# Patient Record
Sex: Male | Born: 1950
Health system: Southern US, Community
[De-identification: ages and names within clinical notes are randomized; demographics above are authoritative.]

## PROBLEM LIST (undated history)

## (undated) ENCOUNTER — Emergency Department (HOSPITAL_COMMUNITY): Admission: EM | Payer: Self-pay | Source: Home / Self Care

## (undated) DIAGNOSIS — Z7901 Long term (current) use of anticoagulants: Secondary | ICD-10-CM

## (undated) DIAGNOSIS — Z87898 Personal history of other specified conditions: Secondary | ICD-10-CM

## (undated) DIAGNOSIS — K4090 Unilateral inguinal hernia, without obstruction or gangrene, not specified as recurrent: Secondary | ICD-10-CM

## (undated) DIAGNOSIS — I8390 Asymptomatic varicose veins of unspecified lower extremity: Secondary | ICD-10-CM

## (undated) DIAGNOSIS — I700 Atherosclerosis of aorta: Secondary | ICD-10-CM

## (undated) DIAGNOSIS — Z87442 Personal history of urinary calculi: Secondary | ICD-10-CM

## (undated) DIAGNOSIS — Z720 Tobacco use: Secondary | ICD-10-CM

## (undated) DIAGNOSIS — I255 Ischemic cardiomyopathy: Secondary | ICD-10-CM

## (undated) DIAGNOSIS — G959 Disease of spinal cord, unspecified: Secondary | ICD-10-CM

## (undated) DIAGNOSIS — F329 Major depressive disorder, single episode, unspecified: Secondary | ICD-10-CM

## (undated) DIAGNOSIS — K2970 Gastritis, unspecified, without bleeding: Secondary | ICD-10-CM

## (undated) DIAGNOSIS — I509 Heart failure, unspecified: Secondary | ICD-10-CM

## (undated) DIAGNOSIS — J449 Chronic obstructive pulmonary disease, unspecified: Secondary | ICD-10-CM

## (undated) DIAGNOSIS — G4700 Insomnia, unspecified: Secondary | ICD-10-CM

## (undated) DIAGNOSIS — I672 Cerebral atherosclerosis: Secondary | ICD-10-CM

## (undated) DIAGNOSIS — K219 Gastro-esophageal reflux disease without esophagitis: Secondary | ICD-10-CM

## (undated) DIAGNOSIS — D649 Anemia, unspecified: Secondary | ICD-10-CM

## (undated) DIAGNOSIS — F419 Anxiety disorder, unspecified: Secondary | ICD-10-CM

## (undated) DIAGNOSIS — Z950 Presence of cardiac pacemaker: Secondary | ICD-10-CM

## (undated) DIAGNOSIS — D3502 Benign neoplasm of left adrenal gland: Secondary | ICD-10-CM

## (undated) DIAGNOSIS — G4733 Obstructive sleep apnea (adult) (pediatric): Secondary | ICD-10-CM

## (undated) DIAGNOSIS — D509 Iron deficiency anemia, unspecified: Secondary | ICD-10-CM

## (undated) DIAGNOSIS — I2510 Atherosclerotic heart disease of native coronary artery without angina pectoris: Secondary | ICD-10-CM

## (undated) DIAGNOSIS — E669 Obesity, unspecified: Secondary | ICD-10-CM

## (undated) DIAGNOSIS — E782 Mixed hyperlipidemia: Secondary | ICD-10-CM

## (undated) DIAGNOSIS — M5090 Cervical disc disorder, unspecified, unspecified cervical region: Secondary | ICD-10-CM

## (undated) DIAGNOSIS — M1990 Unspecified osteoarthritis, unspecified site: Secondary | ICD-10-CM

## (undated) DIAGNOSIS — N200 Calculus of kidney: Secondary | ICD-10-CM

## (undated) DIAGNOSIS — F32A Depression, unspecified: Secondary | ICD-10-CM

## (undated) DIAGNOSIS — E119 Type 2 diabetes mellitus without complications: Secondary | ICD-10-CM

## (undated) DIAGNOSIS — R42 Dizziness and giddiness: Secondary | ICD-10-CM

## (undated) DIAGNOSIS — J45909 Unspecified asthma, uncomplicated: Secondary | ICD-10-CM

## (undated) DIAGNOSIS — I639 Cerebral infarction, unspecified: Secondary | ICD-10-CM

## (undated) DIAGNOSIS — R32 Unspecified urinary incontinence: Secondary | ICD-10-CM

## (undated) DIAGNOSIS — I10 Essential (primary) hypertension: Secondary | ICD-10-CM

## (undated) DIAGNOSIS — I213 ST elevation (STEMI) myocardial infarction of unspecified site: Secondary | ICD-10-CM

## (undated) HISTORY — DX: Cerebral atherosclerosis: I67.2

## (undated) HISTORY — DX: Atherosclerotic heart disease of native coronary artery without angina pectoris: I25.10

## (undated) HISTORY — DX: Obesity, unspecified: E66.9

## (undated) HISTORY — PX: BACK SURGERY: SHX140

## (undated) HISTORY — DX: Mixed hyperlipidemia: E78.2

## (undated) HISTORY — DX: Essential (primary) hypertension: I10

## (undated) HISTORY — PX: CORONARY ANGIOPLASTY: SHX604

## (undated) HISTORY — DX: Tobacco use: Z72.0

## (undated) HISTORY — DX: Obstructive sleep apnea (adult) (pediatric): G47.33

## (undated) HISTORY — PX: HERNIA REPAIR: SHX51

## (undated) HISTORY — DX: Chronic obstructive pulmonary disease, unspecified: J44.9

## (undated) HISTORY — DX: ST elevation (STEMI) myocardial infarction of unspecified site: I21.3

## (undated) HISTORY — PX: EYE SURGERY: SHX253

## (undated) HISTORY — PX: CERVICAL FUSION: SHX112

---

## 2001-01-20 HISTORY — PX: BACK SURGERY: SHX140

## 2003-08-13 ENCOUNTER — Other Ambulatory Visit: Payer: Self-pay

## 2004-03-06 ENCOUNTER — Emergency Department: Payer: Self-pay | Admitting: Internal Medicine

## 2004-07-02 ENCOUNTER — Emergency Department: Payer: Self-pay | Admitting: Unknown Physician Specialty

## 2005-05-16 ENCOUNTER — Emergency Department: Payer: Self-pay | Admitting: Emergency Medicine

## 2005-05-16 ENCOUNTER — Other Ambulatory Visit: Payer: Self-pay

## 2006-03-15 ENCOUNTER — Emergency Department: Payer: Self-pay | Admitting: Emergency Medicine

## 2008-10-29 ENCOUNTER — Inpatient Hospital Stay: Payer: Self-pay | Admitting: Internal Medicine

## 2010-06-07 ENCOUNTER — Inpatient Hospital Stay (HOSPITAL_COMMUNITY)
Admission: EM | Admit: 2010-06-07 | Discharge: 2010-06-11 | DRG: 246 | Disposition: A | Discharge status: Home Health | Payer: Self-pay | Source: Ambulatory Visit | Attending: Cardiovascular Disease | Admitting: Cardiovascular Disease

## 2010-06-07 DIAGNOSIS — F3289 Other specified depressive episodes: Secondary | ICD-10-CM | POA: Diagnosis present

## 2010-06-07 DIAGNOSIS — F17200 Nicotine dependence, unspecified, uncomplicated: Secondary | ICD-10-CM | POA: Diagnosis present

## 2010-06-07 DIAGNOSIS — I10 Essential (primary) hypertension: Secondary | ICD-10-CM | POA: Diagnosis present

## 2010-06-07 DIAGNOSIS — I213 ST elevation (STEMI) myocardial infarction of unspecified site: Secondary | ICD-10-CM

## 2010-06-07 DIAGNOSIS — I498 Other specified cardiac arrhythmias: Secondary | ICD-10-CM | POA: Diagnosis not present

## 2010-06-07 DIAGNOSIS — E785 Hyperlipidemia, unspecified: Secondary | ICD-10-CM | POA: Diagnosis present

## 2010-06-07 DIAGNOSIS — I509 Heart failure, unspecified: Secondary | ICD-10-CM | POA: Diagnosis not present

## 2010-06-07 DIAGNOSIS — Z79899 Other long term (current) drug therapy: Secondary | ICD-10-CM

## 2010-06-07 DIAGNOSIS — F329 Major depressive disorder, single episode, unspecified: Secondary | ICD-10-CM | POA: Diagnosis present

## 2010-06-07 DIAGNOSIS — J4489 Other specified chronic obstructive pulmonary disease: Secondary | ICD-10-CM | POA: Diagnosis present

## 2010-06-07 DIAGNOSIS — I2119 ST elevation (STEMI) myocardial infarction involving other coronary artery of inferior wall: Principal | ICD-10-CM | POA: Diagnosis present

## 2010-06-07 DIAGNOSIS — I2510 Atherosclerotic heart disease of native coronary artery without angina pectoris: Secondary | ICD-10-CM | POA: Diagnosis present

## 2010-06-07 DIAGNOSIS — I5021 Acute systolic (congestive) heart failure: Secondary | ICD-10-CM | POA: Diagnosis not present

## 2010-06-07 DIAGNOSIS — R7989 Other specified abnormal findings of blood chemistry: Secondary | ICD-10-CM | POA: Diagnosis present

## 2010-06-07 DIAGNOSIS — J449 Chronic obstructive pulmonary disease, unspecified: Secondary | ICD-10-CM | POA: Diagnosis present

## 2010-06-07 DIAGNOSIS — D649 Anemia, unspecified: Secondary | ICD-10-CM | POA: Diagnosis present

## 2010-06-07 HISTORY — DX: ST elevation (STEMI) myocardial infarction of unspecified site: I21.3

## 2010-06-07 HISTORY — PX: CARDIAC CATHETERIZATION: SHX172

## 2010-06-07 LAB — DIFFERENTIAL
Basophils Absolute: 0 10*3/uL (ref 0.0–0.1)
Eosinophils Absolute: 0 10*3/uL (ref 0.0–0.7)
Eosinophils Relative: 0 % (ref 0–5)
Lymphs Abs: 0.8 10*3/uL (ref 0.7–4.0)

## 2010-06-07 LAB — PRO B NATRIURETIC PEPTIDE: Pro B Natriuretic peptide (BNP): 718.1 pg/mL — ABNORMAL HIGH (ref 0–125)

## 2010-06-07 LAB — APTT: aPTT: 120 seconds — ABNORMAL HIGH (ref 24–37)

## 2010-06-08 ENCOUNTER — Ambulatory Visit (HOSPITAL_COMMUNITY): Payer: Self-pay

## 2010-06-08 LAB — DIFFERENTIAL
Eosinophils Absolute: 0 10*3/uL (ref 0.0–0.7)
Eosinophils Relative: 0 % (ref 0–5)
Lymphs Abs: 1 10*3/uL (ref 0.7–4.0)
Monocytes Absolute: 0.7 10*3/uL (ref 0.1–1.0)
Monocytes Relative: 8 % (ref 3–12)

## 2010-06-08 LAB — POCT ACTIVATED CLOTTING TIME: Activated Clotting Time: 111 seconds

## 2010-06-08 LAB — APTT: aPTT: 61 seconds — ABNORMAL HIGH (ref 24–37)

## 2010-06-08 LAB — MRSA PCR SCREENING: MRSA by PCR: NEGATIVE

## 2010-06-08 LAB — POTASSIUM: Potassium: 3.8 mEq/L (ref 3.5–5.1)

## 2010-06-08 LAB — PROTIME-INR: INR: 1.19 (ref 0.00–1.49)

## 2010-06-09 ENCOUNTER — Ambulatory Visit (HOSPITAL_COMMUNITY): Payer: Self-pay

## 2010-06-09 LAB — CBC
HCT: 34.2 % — ABNORMAL LOW (ref 39.0–52.0)
Hemoglobin: 11.1 g/dL — ABNORMAL LOW (ref 13.0–17.0)
MCH: 30.2 pg (ref 26.0–34.0)
MCH: 31.3 pg (ref 26.0–34.0)
MCHC: 33.9 g/dL (ref 30.0–36.0)
MCHC: 33.7 g/dL (ref 30.0–36.0)
MCV: 89.4 fL (ref 78.0–100.0)
MCV: 89.1 fL (ref 78.0–100.0)
MCV: 92.7 fL (ref 78.0–100.0)
Platelets: 202 10*3/uL (ref 150–400)
Platelets: 184 10*3/uL (ref 150–400)
RBC: 3.55 MIL/uL — ABNORMAL LOW (ref 4.22–5.81)
RDW: 13.3 % (ref 11.5–15.5)
RDW: 13.5 % (ref 11.5–15.5)
WBC: 13.2 10*3/uL — ABNORMAL HIGH (ref 4.0–10.5)

## 2010-06-09 LAB — CARDIAC PANEL(CRET KIN+CKTOT+MB+TROPI)
CK, MB: 326.8 ng/mL (ref 0.3–4.0)
CK, MB: 224.5 ng/mL (ref 0.3–4.0)
Relative Index: 8.9 — ABNORMAL HIGH (ref 0.0–2.5)
Relative Index: 7.5 — ABNORMAL HIGH (ref 0.0–2.5)
Total CK: 2558 U/L — ABNORMAL HIGH (ref 7–232)
Troponin I: 25 ng/mL (ref <0.30)

## 2010-06-09 LAB — COMPREHENSIVE METABOLIC PANEL
ALT: 65 U/L — ABNORMAL HIGH (ref 0–53)
ALT: 61 U/L — ABNORMAL HIGH (ref 0–53)
AST: 210 U/L — ABNORMAL HIGH (ref 0–37)
AST: 231 U/L — ABNORMAL HIGH (ref 0–37)
AST: 125 U/L — ABNORMAL HIGH (ref 0–37)
Albumin: 3.1 g/dL — ABNORMAL LOW (ref 3.5–5.2)
Albumin: 3 g/dL — ABNORMAL LOW (ref 3.5–5.2)
Alkaline Phosphatase: 101 U/L (ref 39–117)
Alkaline Phosphatase: 103 U/L (ref 39–117)
Alkaline Phosphatase: 109 U/L (ref 39–117)
BUN: 17 mg/dL (ref 6–23)
CO2: 26 mEq/L (ref 19–32)
Calcium: 8.5 mg/dL (ref 8.4–10.5)
Chloride: 99 mEq/L (ref 96–112)
Chloride: 102 mEq/L (ref 96–112)
Chloride: 106 mEq/L (ref 96–112)
Creatinine, Ser: 1.08 mg/dL (ref 0.4–1.5)
GFR calc Af Amer: >60 mL/min (ref >60)
GFR calc Af Amer: >60 mL/min (ref >60)
GFR calc Af Amer: >60 mL/min (ref >60)
GFR calc non Af Amer: 52 mL/min — ABNORMAL LOW (ref >60)
GFR calc non Af Amer: >60 mL/min (ref >60)
Glucose, Bld: 163 mg/dL — ABNORMAL HIGH (ref 70–99)
Glucose, Bld: 127 mg/dL — ABNORMAL HIGH (ref 70–99)
Potassium: 3.2 mEq/L — ABNORMAL LOW (ref 3.5–5.1)
Potassium: 3.3 mEq/L — ABNORMAL LOW (ref 3.5–5.1)
Sodium: 138 mEq/L (ref 135–145)
Sodium: 139 mEq/L (ref 135–145)
Total Bilirubin: 0.5 mg/dL (ref 0.3–1.2)
Total Bilirubin: 0.8 mg/dL (ref 0.3–1.2)
Total Bilirubin: 0.9 mg/dL (ref 0.3–1.2)
Total Protein: 5.6 g/dL — ABNORMAL LOW (ref 6.0–8.3)
Total Protein: 5.5 g/dL — ABNORMAL LOW (ref 6.0–8.3)

## 2010-06-09 LAB — CK TOTAL AND CKMB (NOT AT ARMC)
CK, MB: 145.2 ng/mL (ref 0.3–4.0)
Relative Index: 9.1 — ABNORMAL HIGH (ref 0.0–2.5)
Total CK: 1595 U/L — ABNORMAL HIGH (ref 7–232)

## 2010-06-09 LAB — TSH: TSH: 1.442 u[IU]/mL (ref 0.350–4.500)

## 2010-06-09 LAB — HEMOGLOBIN A1C: Hgb A1c MFr Bld: 6 % (ref <5.7)

## 2010-06-10 ENCOUNTER — Ambulatory Visit (HOSPITAL_COMMUNITY): Payer: Self-pay

## 2010-06-10 DIAGNOSIS — I10 Essential (primary) hypertension: Secondary | ICD-10-CM | POA: Insufficient documentation

## 2010-06-10 LAB — POCT I-STAT, CHEM 8
Creatinine, Ser: 0.8 mg/dL (ref 0.4–1.5)
Glucose, Bld: 134 mg/dL — ABNORMAL HIGH (ref 70–99)
HCT: 37 % — ABNORMAL LOW (ref 39.0–52.0)
Hemoglobin: 12.6 g/dL — ABNORMAL LOW (ref 13.0–17.0)
Potassium: 2.5 mEq/L — CL (ref 3.5–5.1)
TCO2: 18 mmol/L (ref 0–100)

## 2010-06-10 LAB — COMPREHENSIVE METABOLIC PANEL
AST: 58 U/L — ABNORMAL HIGH (ref 0–37)
Albumin: 2.7 g/dL — ABNORMAL LOW (ref 3.5–5.2)
Alkaline Phosphatase: 122 U/L — ABNORMAL HIGH (ref 39–117)
BUN: 18 mg/dL (ref 6–23)
CO2: 24 mEq/L (ref 19–32)
Chloride: 107 mEq/L (ref 96–112)
Creatinine, Ser: 0.83 mg/dL (ref 0.4–1.5)
GFR calc Af Amer: >60 mL/min (ref >60)
GFR calc non Af Amer: >60 mL/min (ref >60)
Potassium: 3.7 mEq/L (ref 3.5–5.1)
Total Bilirubin: 1 mg/dL (ref 0.3–1.2)

## 2010-06-10 LAB — CBC
HCT: 33.3 % — ABNORMAL LOW (ref 39.0–52.0)
Hemoglobin: 11.4 g/dL — ABNORMAL LOW (ref 13.0–17.0)
MCH: 31.3 pg (ref 26.0–34.0)
MCHC: 34.2 g/dL (ref 30.0–36.0)
MCV: 91.5 fL (ref 78.0–100.0)
Platelets: 165 10*3/uL (ref 150–400)
RBC: 3.64 MIL/uL — ABNORMAL LOW (ref 4.22–5.81)
WBC: 9.3 10*3/uL (ref 4.0–10.5)

## 2010-06-10 LAB — CARDIAC PANEL(CRET KIN+CKTOT+MB+TROPI)
Relative Index: 1.3 (ref 0.0–2.5)
Troponin I: 12.33 ng/mL (ref <0.30)

## 2010-06-11 LAB — BASIC METABOLIC PANEL
Calcium: 8.9 mg/dL (ref 8.4–10.5)
Creatinine, Ser: 0.99 mg/dL (ref 0.4–1.5)
GFR calc Af Amer: >60 mL/min (ref >60)
GFR calc non Af Amer: >60 mL/min (ref >60)
Sodium: 141 mEq/L (ref 135–145)

## 2010-06-11 LAB — CBC
MCH: 30.5 pg (ref 26.0–34.0)
MCHC: 33.1 g/dL (ref 30.0–36.0)
Platelets: 187 10*3/uL (ref 150–400)
RDW: 13.7 % (ref 11.5–15.5)

## 2010-07-04 NOTE — Discharge Summary (Signed)
Louis Gutierrez, Louis Gutierrez                  ACCOUNT NO.:  [id number]  MEDICAL RECORD NO.:  [id number]           PATIENT TYPE:  I  LOCATION:  2924                         FACILITY:  [facility]  PHYSICIAN:  Nicki Guadalajara, M.D.     DATE OF BIRTH:  [birth year]  DATE OF ADMISSION:  06/07/2010 DATE OF DISCHARGE:  06/11/2010                              DISCHARGE SUMMARY   DISCHARGE DIAGNOSES: 1. ST-elevation myocardial infarction of the inferior wall affecting     the right coronary artery.     a.     Status post emergent catheterization with rescue      percutaneous transluminal coronary angioplasty and stent      deployment to the right coronary artery with a drug-eluting PROMUS      stent.     b.     Residual nonobstructive disease in the diagonal of the left      anterior descending coronary artery of 40 and 50%. 2. Bradycardia during procedure. 3. Hypertension. 4. Chronic obstructive pulmonary disease. 5. Tobacco use. 6. Hypokalemia resolved. 7. Mild systolic heart failure secondary to thin myocardium resolved. 8. Abnormal LFTs improving secondary to myocardial infarction. 9. Dyslipidemia. 10.Mild anemia stable. 11.Mild left ventricular dysfunction with ejection fraction 45-50%.  DISCHARGE CONDITION:  Improved.  PROCEDURES:  Emergent cardiac catheterization Jun 07, 2010.  Jun 07, 2010, rescue PTCA and stent deployment to the right coronary artery by Dr. Tresa Endo now with a PROMUS drug-eluting stent.  DISCHARGE MEDICATIONS: 1. Tylenol 325 mg every 4 hours as needed. 2. Colace 100 mg twice a day as needed for constipation. 3. Imdur 30 mg one daily. 4. Metoprolol tartrate 25 mg every 12 hours lower dose due to heart     rate. 5. Niacin 500 mg, this is Niaspan one at bedtime with a low-fat snack. 6. Nicotine patch 21 mg 24 hours transdermally daily. 7. Nitroglycerin sublingual p.r.n. chest pain. 8. Protonix 40 mg daily to prevent stomach irritation. 9. Ramipril 5 mg daily. 10.Crestor 20 mg  daily. 11.Brilinta twice a day as ordered, do not stop, stopping could cause     a heart attack. 12.Ambien 5 mg daily at bedtime as needed as before. 13.Aspirin 81 mg daily. 14.Paroxetine 20 mg daily twice a day as before. 15.QVAR inhaler twice daily. 16.Ventolin albuterol four times a day as needed. 17.Stop hydrochlorothiazide. 18.Stop Advil.  DISCHARGE INSTRUCTIONS: 1. No work until cleared by Dr. Tresa Endo. 2. Increase activity slowly.  May shower.  No lifting for 2 weeks.  No     driving for 2 weeks.  No sexual activity for 2 weeks. 3. Low-sodium heart-healthy diet. 4. Wash cath site with soap and water.  Call if any bleeding, swelling     or drainage. 5. Follow up with Dr. Tresa Endo or Dr. Royann Shivers June 25, 2010, at 9:45 a.m. 6. They are at Dr. Landry Dyke at [facility], [address], [phone].  HOSPITAL COURSE:  Louis Gutierrez is a 60 year old male that developed chest pain at 4 a.m. on Jun 07, 2010.  He started with indigestion that waxed  and waned during the day.  He had syncopal episode and then came by EMS directly to the cath lab as he had ST elevation MI on his EKG in his inferior leads II, III and aVF.  Urgently taken to the lab and underwent cardiac cath which revealed 100% occlusion of the RCA which was a large vessel and underwent PTCA and stent deployment with successful angioplasty and stent with a drug-eluting PROMUS stent.  His EF was found to be 45-50% on cath.  He was then admitted to the hospital.  PAST MEDICAL HISTORY: 1. Hypertension. 2. COPD with ongoing tobacco use. 3. History of depression. 4. No history of CVA or GI bleed.  Recently his glycohemoglobin was     slightly elevated.  ALLERGY:  PENICILLIN.  LABORATORIES AT DISCHARGE:  Hemoglobin 11.3, hematocrit 34.1, WBC 7.9, and platelets 187.  These were stable throughout hospitalization.  Pro time 15, INR 1.16, PTT 29.  Chemistry sodium 141, potassium 3.8, chloride  106, CO2 28, glucose 110, BUN 16, creatinine 0.99.  Total bili 1.0, alkaline phos 122, SGOT 58 which is down from peak of 231, SGPT is 46 down from peak of 65.  Total protein 5.4, albumin 2.7, calcium 8.6, magnesium was 1.8.  Hemoglobin A1c was 6.0.  Cardiac enzymes initial CK was 1595 with an MB of 145 and troponin of 9.53, peak CK was 3662 with an MB of 326 and troponin peak was greater than 25, they have come down. CK prior to discharge was 572 with an MB of 7.5 and a troponin I of 12.33.  BNP initially was 718.  It did peak at 2000 when he had some mild heart failure after his cath.  Total cholesterol was 109, triglycerides 102, HDL 25, LDL 64.  TSH 1.442 and MRSA screen was negative.  Initial chest x-ray chronic changes, unknown linear objects projects at the left chest.  Follow up on Jun 09, 2010, chest x-ray increasing bibasilar edema.  No linear object over the left chest was changed in configuration and a follow-up chest x-ray on Jun 10, 2010, heart size has enlarged with a basilar mild interstitial edema and atelectasis.  No significant interval change.  No enlarging effusion or pneumo, large cervical fusion hardware evident and stable chest.  Two-D echo was done.  EF on this was 55-60%.  Right atrium pressure was 10-15 without regional wall motion abnormalities.  The patient ambulated with cardiac rehab and information on exercise and stopping tobacco was explained to him as well as tobacco cessation consultation.  The patient was seen by Dr. Royann Shivers on Jun 11, 2010, and felt stable for discharge home.  The patient may need assistance with medications for the Brilinta.  We did get him discount cards and he will pickup samples in our office as well.     Darcella Gasman. Annie Paras, N.P.   ______________________________ Nicki Guadalajara, M.D.    LRI/MEDQ  D:  06/11/2010  T:  06/12/2010  Job:  [id number]  cc:   __________Clare Maren Gutierrez  Electronically Signed by Nada Boozer N.P. on  06/19/2010 04:37:34 PM Electronically Signed by Nicki Guadalajara M.D. on 07/04/2010 04:10:00 PM

## 2010-08-01 NOTE — Cardiovascular Report (Signed)
NAMEAMARIE, TARTE                  ACCOUNT NO.:  [id number]  MEDICAL RECORD NO.:  [id number]           PATIENT TYPE:  E  LOCATION:  MAJO                         FACILITY:  [facility]  PHYSICIAN:  Nicki Guadalajara, M.D.     DATE OF BIRTH:  15-Oct-1950  DATE OF PROCEDURE:  06/07/2010 DATE OF DISCHARGE:                           CARDIAC CATHETERIZATION   Emergent cardiac catheterization, percutaneous coronary prevention, temporary pacemaker thrombectomy.  INDICATIONS:  Mr. Nell Schrack is a 60 year old gentleman who apparently awakened from sleep at approximately 4 a.m. early this morning on Jun 07, 2010.  The patient continued to have chest pain intermittently throughout the day.  Approximately 8 p.m., he had a syncopal spell.  EMS was contacted and he was felt to have findings compatible with an acute inferior wall ST-segment elevation myocardial infarction.  A code STEMI was called and he was ultimately transported to [facility] for acute catheterization.  Upon arrival to the catheterization laboratory, the patient was having chest pain.  The patient had received heparin and aspirin.  Right femoral artery was punctured anteriorly and a 6-French sheath was inserted without difficulty.  With the demonstration of inferior ST- segment MI, decision was made to use a diagnostic left catheter with plans for a guiding catheter to the right system.  A 6-French FL-4 diagnostic catheter was then inserted and selective angiography in left coronary system was performed.  A 6-French JR-4 guiding catheter was then inserted and selective angiography in the right coronary artery revealed total occlusion with TIMI 0 flow.  At this point, the patient became significantly bradycardic and hypotensive, and consequently prior to the balloon dilatation it was felt that temporary pacemaker as needed to be inserted.  The right femoral vein was then punctured and a 5- [place] transvenous pacemaker was  inserted via venous sheath to the RV apex with documentation of excellent capture.  Attention was then directed at opening up the totally occluded right coronary artery.  The patient received Angiomax bolus plus an infusion.  He also received 180 mg of oral Brilinta.  An Asahi medium wire was then able to cross the total occlusion.  However, despite crossing the occlusion there was hardly any flow.  Consequently, there appeared to be significant congealed thrombus burden.  An Export thrombectomy catheter was then inserted and the multiple runs were made without significant clot retrieval due to the very dense clot.  At times, it was if the guide had gone subintimal but it did appear to be truly intimal in multiple views. A 2.5- x 12-mm apex balloon was then inserted and despite dilatation again it was difficult to reestablish flow.  Again looking at multiple views and the ease at which the distal wire did seem to traverse distal vessels, it appeared to be intraluminal and consequently the 2.5 balloon was then upgraded to a 3.0- x 20-mm apex balloon.  Multiple dilatations were made.  A 3.5- x 24-mm Promus Element stent was then inserted and dilated x2 at 12 and 13 atmospheres.  The stent delivery catheter was then removed, and a 3.75- x 20-mm [place] Quantum was  used for poststent dilatation at 12, 13, and 14 atmospheres.  This resulted in excellent TIMI 3 flow which now filled a very large dominant right coronary artery which ended in the PDA and inferior LV branch of PLA, and there also appeared to be a distal branch which seemed to traverse backwards and emptied superiorly.  Total balloon time was 31 minutes which was still very low considering a pacemaker need to be inserted prior to the initial balloon dilatations.  A pigtail catheter was then inserted and biplane cine left ventriculography was performed.  Distal aortography was performed to evaluate for renal artery stenosis or  aortoiliac disease.  The patient left the catheterization laboratory pain free with stable hemodynamics. Sheath was sutured in place.  He left the lab with continued Angiomax infusion which was decreased to 0.25 mg/kg per hour.  Since he was no longer using his pacemaker, the temporary pacemaker was removed.  HEMODYNAMIC DATA:  Central aortic pressure was 105/72.  Left ventricular pressure 105/22.  Post A-wave 27.  ANGIOGRAPHIC DATA: 1. Left main coronary artery was normal and apparently trifurcated     into a large LAD system, large ramus intermediate system, and a     diminutive AV groove proximal circumflex. 2. The LAD gave rise to a first diagonal vessel that had 50% ostial     narrowing and a second diagonal vessel which had 40% ostial     narrowing.  The LAD gave rise to several septal perforating     arteries and extended to the apex. 3. The ramus intermediate vessel was a large vessel which gave rise to     three branches that was free of significant disease. 4. The AV groove circumflex was a diminutive vessel that was occluded     proximally. 5. The right coronary artery was a large vessel that was totally     occluded in its mid segment with TIMI 0 flow.  As noted above,     thrombectomy was performed and it was felt that the clot was very     congealed which may have initiated at 4 a.m. early this morning.     However, following PTCA with multiple balloons, an ultimate     stenting with a 3.5- x 24-mm DES Promus Element stent postdilated     to 3.75 mm, the 100% occlusion was reduced to 0%.  There was brisk     TIMI 3 flow and the vessel was a very large dominant right coronary     artery which supplied a large PDA, inferior LV branch, PLA system,     and also had a small branch vessel distally which seemed to go     anteriorly and superiorly. 6. Biplane cine left ventriculography revealed mild acute LV     dysfunction with an EF of 45% to 50% with hypocontractility      involving the inferior wall on the RAO projection and involving the     inferoapical to low posterolateral wall on the LAO projection.  Distal aortography did not demonstrate any renal artery stenosis or significant aortoiliac disease.  IMPRESSION: 1. Acute ST-segment elevation inferior wall myocardial infarction     secondary total mid right coronary artery occlusion with initial     100% occlusion and TIMI 0 flow. 2. Hypotension/bradycardia requiring temporary pacemaker insertion. 3. A 50% first diagonal and 40% second diagonal stenosis of the left     anterior descending. 4. Large ramus intermediate system. 5. Very diminutive occluded  atrioventricular groove circumflex. 6. Successful percutaneous transluminal coronary angioplasty/stenting     of the right coronary artery with ultimate insertion of a 3.5- x 24-     mm drug-eluting stent Promus Element stent postdilated to 3.75 mm. 7. Angiomax/Brilinta 180 mg, IV nitroglycerin. 8. Total balloon time 31 minutes.          ______________________________ Nicki Guadalajara, M.D.     TK/MEDQ  D:  06/14/2010  T:  06/15/2010  Job:  [id number]  Electronically Signed by Nicki Guadalajara M.D. on 07/04/2010 04:09:58 PM

## 2012-03-14 ENCOUNTER — Emergency Department: Payer: Self-pay | Admitting: Emergency Medicine

## 2012-03-14 LAB — BASIC METABOLIC PANEL
BUN: 12 mg/dL (ref 7–18)
Calcium, Total: 9.3 mg/dL (ref 8.5–10.1)
Co2: 29 mmol/L (ref 21–32)
Creatinine: 0.95 mg/dL (ref 0.60–1.30)
EGFR (African American): >60
Glucose: 120 mg/dL — ABNORMAL HIGH (ref 65–99)
Osmolality: 275 (ref 275–301)
Potassium: 4.6 mmol/L (ref 3.5–5.1)
Sodium: 137 mmol/L (ref 136–145)

## 2012-03-14 LAB — CBC
HGB: 14.8 g/dL (ref 13.0–18.0)
MCHC: 34 g/dL (ref 32.0–36.0)
MCV: 88 fL (ref 80–100)
RBC: 4.94 10*6/uL (ref 4.40–5.90)
RDW: 14.6 % — ABNORMAL HIGH (ref 11.5–14.5)
WBC: 9.6 10*3/uL (ref 3.8–10.6)

## 2012-03-14 LAB — CK TOTAL AND CKMB (NOT AT ARMC): CK, Total: 143 U/L (ref 35–232)

## 2012-06-20 ENCOUNTER — Encounter: Payer: Self-pay | Admitting: Pharmacist Clinician (PhC)/ Clinical Pharmacy Specialist

## 2012-06-22 ENCOUNTER — Ambulatory Visit (INDEPENDENT_AMBULATORY_CARE_PROVIDER_SITE_OTHER): Payer: Medicare Other | Admitting: Cardiovascular Disease

## 2012-06-22 ENCOUNTER — Encounter: Payer: Self-pay | Admitting: Cardiovascular Disease

## 2012-06-22 VITALS — BP 142/92 | HR 56 | Ht 71.0 in | Wt 225.0 lb

## 2012-06-22 DIAGNOSIS — I10 Essential (primary) hypertension: Secondary | ICD-10-CM

## 2012-06-22 DIAGNOSIS — J449 Chronic obstructive pulmonary disease, unspecified: Secondary | ICD-10-CM

## 2012-06-22 DIAGNOSIS — R079 Chest pain, unspecified: Secondary | ICD-10-CM

## 2012-06-22 DIAGNOSIS — I213 ST elevation (STEMI) myocardial infarction of unspecified site: Secondary | ICD-10-CM

## 2012-06-22 DIAGNOSIS — Z9861 Coronary angioplasty status: Secondary | ICD-10-CM

## 2012-06-22 DIAGNOSIS — Z79899 Other long term (current) drug therapy: Secondary | ICD-10-CM

## 2012-06-22 DIAGNOSIS — E669 Obesity, unspecified: Secondary | ICD-10-CM

## 2012-06-22 DIAGNOSIS — E782 Mixed hyperlipidemia: Secondary | ICD-10-CM

## 2012-06-22 DIAGNOSIS — I2510 Atherosclerotic heart disease of native coronary artery without angina pectoris: Secondary | ICD-10-CM

## 2012-06-22 DIAGNOSIS — Z720 Tobacco use: Secondary | ICD-10-CM

## 2012-06-22 DIAGNOSIS — G4733 Obstructive sleep apnea (adult) (pediatric): Secondary | ICD-10-CM | POA: Insufficient documentation

## 2012-06-22 DIAGNOSIS — F17200 Nicotine dependence, unspecified, uncomplicated: Secondary | ICD-10-CM

## 2012-06-22 DIAGNOSIS — I219 Acute myocardial infarction, unspecified: Secondary | ICD-10-CM

## 2012-06-22 LAB — COMPREHENSIVE METABOLIC PANEL
AST: 16 U/L (ref 0–37)
Albumin: 3.7 g/dL (ref 3.5–5.2)
BUN: 16 mg/dL (ref 6–23)
Calcium: 9.5 mg/dL (ref 8.4–10.5)
Chloride: 107 mEq/L (ref 96–112)
Creat: 1.04 mg/dL (ref 0.50–1.35)
Glucose, Bld: 94 mg/dL (ref 70–99)

## 2012-06-22 LAB — LIPID PANEL
HDL: 32 mg/dL — ABNORMAL LOW (ref >39)
Total CHOL/HDL Ratio: 5.3 Ratio
Triglycerides: 172 mg/dL — ABNORMAL HIGH (ref <150)

## 2012-06-22 NOTE — Patient Instructions (Addendum)
Your physician recommends that you return for lab work. Your physician has requested that you have en exercise stress myoview. Your physician recommends that you schedule a follow-up appointment in: 1 year

## 2012-06-22 NOTE — Progress Notes (Signed)
Patient ID: Louis Gutierrez, male   DOB: 1950/03/15, 62 y.o.   MRN: [id number]  CAD, chest pain  Reason for office visit CAD, chest pain   [name] returns in followup for coronary disease. He presented with ST segment elevation myocardial infarction in May of 2012 almost exactly 2 years ago. At that time had an inferior wall infarction, manifesting as unstable angina pectoris and eventually a syncopal episode. The right coronary artery was totally occluded and revascularization was difficult but eventually he received a 3.5 x 24 mm Promus drug-eluting stent to the mid RCA. Also noted was 50% ostial stenosis of the first diagonal artery and 40% ostial stenosis of the second diagonal artery branches of the LAD, as well as total occlusion of a small left circumflex system. There was evidence of inferolateral hypokinesis and ejection fraction of 45-50%.  Recently has developed some brief episodes of chest discomfort, usually associated with emotional outbursts but also associated with exertion and relieved by rest. These are slightly reminiscent of his preinfarction angina. He has also noticed that his blood pressures periodically quite high. He denies syncope palpitations or shortness of breath at rest. He continues to smoke and has frequent problems with wheezing and exertional dyspnea.   Allergies  Allergen Reactions  . Penicillins     Current Outpatient Prescriptions  Medication Sig Dispense Refill  . acetaminophen (TYLENOL) 500 MG tablet Take 500 mg by mouth as needed for pain.      Marland Kitchen albuterol (PROVENTIL HFA;VENTOLIN HFA) 108 (90 BASE) MCG/ACT inhaler Inhale 1 puff into the lungs 2 (two) times daily.      Marland Kitchen aspirin EC 81 MG tablet Take 81 mg by mouth daily.      . beclomethasone (QVAR) 80 MCG/ACT inhaler Inhale 1 puff into the lungs 2 (two) times daily.      . metoprolol tartrate (LOPRESSOR) 25 MG tablet Take 25 mg by mouth 2 (two) times daily.      . niacin 500 MG tablet Take 500 mg by mouth  daily.      . nitroGLYCERIN (NITROSTAT) 0.4 MG SL tablet Place 0.4 mg under the tongue every 5 (five) minutes as needed for chest pain.      Marland Kitchen PARoxetine (PAXIL) 20 MG tablet Take 20 mg by mouth 2 (two) times daily.      Marland Kitchen tiotropium (SPIRIVA) 18 MCG inhalation capsule Place 18 mcg into inhaler and inhale daily.      . clopidogrel (PLAVIX) 75 MG tablet Take 1 tablet (75 mg total) by mouth daily.  30 tablet  11  . hydrochlorothiazide (HYDRODIURIL) 25 MG tablet Take 1 tablet (25 mg total) by mouth daily.  30 tablet  11  . isosorbide mononitrate (IMDUR) 30 MG 24 hr tablet Take 1 tablet (30 mg total) by mouth daily.  90 tablet  3  . pravastatin (PRAVACHOL) 40 MG tablet Take 1 tablet (40 mg total) by mouth daily.  30 tablet  11  . ramipril (ALTACE) 10 MG capsule Take 1 capsule (10 mg total) by mouth daily.  30 capsule  11   No current facility-administered medications for this visit.    Past Medical History  Diagnosis Date  . Hypertension 06/10/2010    ECHO at [facility] - see Media tab  . STEMI (ST elevation myocardial infarction) 06/07/2010  . Mixed hyperlipidemia   . OSA (obstructive sleep apnea)   . COPD (chronic obstructive pulmonary disease)   . Tobacco abuse   . CAD (coronary artery disease)   .  Obesity     Past Surgical History  Procedure Laterality Date  . Cardiac catheterization  06/07/2010    50 % ostial narrowing in 1st diagonal from LAD, 2nd diagonal had 40% ostial narrowing; AV groove circumflex was diminutive and occluded proximally; RCA totally occluded in mid segment - stented with 3.5 x 24mm Promus Element DES  - See Media tab    Family History  Problem Relation Age of Onset  . Cancer Mother   . Stroke Maternal Grandmother   . Diabetes Brother   . Coronary artery disease Brother     History   Social History  . Marital Status: Unknown    Spouse Name: N/A    Number of Children: N/A  . Years of Education: N/A   Occupational History  . Not on file.   Social History  Main Topics  . Smoking status: Current Every Day Smoker -- 1.00 packs/day for 48 years    Types: Cigarettes  . Smokeless tobacco: Not on file  . Alcohol Use: No  . Drug Use: No  . Sexually Active: Not on file   Other Topics Concern  . Not on file   Social History Narrative  . No narrative on file    Review of systems: The patient specifically denies any chest pain at rest, dyspnea at rest, orthopnea, paroxysmal nocturnal dyspnea, syncope, palpitations, focal neurological deficits, intermittent claudication, lower extremity edema, unexplained weight gain. He has daily cough and  Wheezing. He denies hemoptysis fever or chills  The patient also denies abdominal pain, nausea, vomiting, dysphagia, diarrhea, constipation, polyuria, polydipsia, dysuria, hematuria, frequency, urgency, abnormal bleeding or bruising, fever, chills, unexpected weight changes, mood swings, change in skin or hair texture, change in voice quality, auditory or visual problems, allergic reactions or rashes, new musculoskeletal complaints other than usual "aches and pains".   PHYSICAL EXAM BP 142/92  Pulse 56  Ht 5\' 11"  (1.803 m)  Wt 225 lb (102.059 kg)  BMI 31.39 kg/m2  General: Alert, oriented x3, no distress Head: no evidence of trauma, PERRL, EOMI, no exophtalmos or lid lag, no myxedema, no xanthelasma; normal ears, nose and oropharynx Neck: normal jugular venous pulsations and no hepatojugular reflux; brisk carotid pulses without delay and no carotid bruits Chest: clear to auscultation, no signs of consolidation by percussion or palpation, normal fremitus, symmetrical and full respiratory excursions Cardiovascular: normal position and quality of the apical impulse, regular rhythm, normal first and second heart sounds, no murmurs, rubs or gallops Abdomen: no tenderness or distention, no masses by palpation, no abnormal pulsatility or arterial bruits, normal bowel sounds, no hepatosplenomegaly Extremities: no  clubbing, cyanosis or edema; 2+ radial, ulnar and brachial pulses bilaterally; 2+ right femoral, posterior tibial and dorsalis pedis pulses; 2+ left femoral, posterior tibial and dorsalis pedis pulses; no subclavian or femoral bruits Neurological: grossly nonfocal   EKG: nsr, incomplete right bundle branch block, no dynamic change  Lipid Panel     Component Value Date/Time   CHOL 170 06/22/2012 1020   TRIG 172* 06/22/2012 1020   HDL 32* 06/22/2012 1020   CHOLHDL 5.3 06/22/2012 1020   VLDL 34 06/22/2012 1020   LDLCALC 104* 06/22/2012 1020    BMET    Component Value Date/Time   NA 139 06/22/2012 1020   K 4.9 06/22/2012 1020   CL 107 06/22/2012 1020   CO2 29 06/22/2012 1020   GLUCOSE 94 06/22/2012 1020   BUN 16 06/22/2012 1020   CREATININE 1.04 06/22/2012 1020   CREATININE 0.99  06/11/2010 0400   CALCIUM 9.5 06/22/2012 1020   GFRNONAA >60 06/11/2010 0400   GFRAA  Value: >60        The eGFR has been calculated using the MDRD equation. This calculation has not been validated in all clinical situations. eGFR's persistently <60 mL/min signify possible Chronic Kidney Disease. 06/11/2010 0400     ASSESSMENT AND PLAN CAD (coronary artery disease) It has been 2 years since the stent placed in the right coronary artery for the acute MI, which makes it unlikely that he would've developed restenosis. However he may have new coronary stenoses. I have recommended that he have a myocardial perfusion study.  Hypertension Increase the hydrochlorothiazide to 25 mg once daily  Mixed hyperlipidemia It is time to repeat a lipid profile. He is taking a statin.  Tobacco abuse We discussed smoking cessation techniques and the critical importance of smoking cessation for over 10 minutes.  OSA (obstructive sleep apnea) He tries to wear the CPAP machine 100% of the time but sometimes has to take it off due to profound nasal discharge  COPD (chronic obstructive pulmonary disease) I have always believe that the major cause of  his dyspnea his COPD. Unfortunately this will prevent her from exercising to an appropriate level on the treadmill. Therefore recommended that he have a [employer].   Orders Placed This Encounter  Procedures  . Comp Met (CMET)  . Lipid Profile  . Myocardial Perfusion Imaging  . EKG 12-Lead   No orders of the defined types were placed in this encounter.    Junious Silk, MD, [facility] [phone] office [phone] pager

## 2012-06-23 ENCOUNTER — Telehealth: Payer: Self-pay | Admitting: Cardiovascular Disease

## 2012-06-23 NOTE — Telephone Encounter (Signed)
Pt saw Dr Salena Saner yesterday-he was suppose to call in a prescription for his Hydrochlorothiazide-Went to the pharmacy-they did not have it!Would you please call in today to [facility]-[phone]

## 2012-06-24 ENCOUNTER — Telehealth: Payer: Self-pay | Admitting: Cardiovascular Disease

## 2012-06-24 MED ORDER — ISOSORBIDE MONONITRATE ER 30 MG PO TB24: 30.0000 mg | ORAL_TABLET | Freq: Every day | ORAL | Status: DC

## 2012-06-24 MED ORDER — RAMIPRIL 10 MG PO CAPS: 10.0000 mg | ORAL_CAPSULE | Freq: Every day | ORAL | Status: DC

## 2012-06-24 MED ORDER — CLOPIDOGREL BISULFATE 75 MG PO TABS: 75.0000 mg | ORAL_TABLET | Freq: Every day | ORAL | Status: DC

## 2012-06-24 NOTE — Telephone Encounter (Signed)
Returning Edroy call-concerning test results

## 2012-06-24 NOTE — Telephone Encounter (Signed)
Pt called said he is out all of is medicine!

## 2012-06-24 NOTE — Telephone Encounter (Signed)
Louis Gutierrez is calling about his prescriptions the pharmacy says that they have faxed over the request , also patient is calling about his test results .Marland Kitchen

## 2012-06-24 NOTE — Telephone Encounter (Signed)
Rx was sent to pharmacy electronically. 

## 2012-06-25 MED ORDER — HYDROCHLOROTHIAZIDE 12.5 MG PO CAPS: 12.5000 mg | ORAL_CAPSULE | Freq: Every day | ORAL | Status: DC

## 2012-06-25 NOTE — Telephone Encounter (Signed)
Rx was sent to pharmacy electronically. 

## 2012-06-25 NOTE — Telephone Encounter (Signed)
Still have not gotten his HZTC-ompletely out of it-Have not taken it since Tuesday!-Please call this in today to [facility]--[phone]!

## 2012-06-27 NOTE — Telephone Encounter (Signed)
lmom 

## 2012-06-28 ENCOUNTER — Telehealth: Payer: Self-pay | Admitting: *Deleted

## 2012-06-28 DIAGNOSIS — Z79899 Other long term (current) drug therapy: Secondary | ICD-10-CM

## 2012-06-28 DIAGNOSIS — E785 Hyperlipidemia, unspecified: Secondary | ICD-10-CM

## 2012-06-28 MED ORDER — HYDROCHLOROTHIAZIDE 25 MG PO TABS: 25.0000 mg | ORAL_TABLET | Freq: Every day | ORAL | Status: DC

## 2012-06-28 MED ORDER — PRAVASTATIN SODIUM 40 MG PO TABS: 40.0000 mg | ORAL_TABLET | Freq: Every day | ORAL | Status: DC

## 2012-06-28 NOTE — Telephone Encounter (Signed)
Message copied by Marella Bile on Mon Jun 28, 2012  9:06 AM ------      Message from: Thurmon Fair      Created: Mon Jun 28, 2012  8:35 AM       Yes, I did tell him to double HCTZ and failed to change it in orders. We recorded pravastatin on his meds at recent office visit, so I thought he was taking it. Please make sure that he is not taking another statin, and if not he needs to start 40 mg in the evening daily, repeat lipids in 3 months. Thanks            Thurmon Fair, MD                   ----- Message -----         From: Marella Bile, RN         Sent: 06/28/2012   8:23 AM           To: Thurmon Fair, MD            Good morning!  I just spoke with gene Tal about his labs.  You had ordered to increase his pravastatin to 80mg , but he has not been taking pravastatin at all.  Also, he said that at your last ov last week you advised him to increase his HCTZ.  I don't have record of that...is that true?            Thx!       ------

## 2012-06-28 NOTE — Telephone Encounter (Signed)
Message copied by Marella Bile on Mon Jun 28, 2012  8:54 AM ------      Message from: Thurmon Fair      Created: Wed Jun 23, 2012  8:02 AM       Labs OK but LDL too high, increase pravastatin to 80 mg at bedtime daily ------

## 2012-06-28 NOTE — Telephone Encounter (Signed)
I spoke with patient. We reviewed his lab results.  I will contact him with additional orders when Dr Royann Shivers gets back with me.

## 2012-06-28 NOTE — Telephone Encounter (Signed)
I advised Louis Gutierrez to increase the pravachol, but he is not taking it at all.  I sent msg to Dr Royann Shivers, he advised to start pravastatin at 40mg  and also advised that the hctz was doubled at last office visit.  Pt aware of med changes and RX sent to reflect starting pravastatin and increase the hctz

## 2012-06-28 NOTE — Telephone Encounter (Signed)
Pt aware and meds ordered.  labslip mailed for recheck in 3 months

## 2012-06-30 DIAGNOSIS — J449 Chronic obstructive pulmonary disease, unspecified: Secondary | ICD-10-CM | POA: Insufficient documentation

## 2012-06-30 DIAGNOSIS — Z720 Tobacco use: Secondary | ICD-10-CM | POA: Insufficient documentation

## 2012-06-30 NOTE — Assessment & Plan Note (Signed)
We discussed smoking cessation techniques and the critical importance of smoking cessation for over 10 minutes.

## 2012-06-30 NOTE — Assessment & Plan Note (Signed)
Increase the hydrochlorothiazide to 25 mg once daily

## 2012-06-30 NOTE — Assessment & Plan Note (Signed)
He tries to wear the CPAP machine 100% of the time but sometimes has to take it off due to profound nasal discharge

## 2012-06-30 NOTE — Assessment & Plan Note (Signed)
It is time to repeat a lipid profile. He is taking a statin.

## 2012-06-30 NOTE — Assessment & Plan Note (Signed)
I have always believe that the major cause of his dyspnea his COPD. Unfortunately this will prevent her from exercising to an appropriate level on the treadmill. Therefore recommended that he have a [employer].

## 2012-06-30 NOTE — Assessment & Plan Note (Signed)
It has been 2 years since the stent placed in the right coronary artery for the acute MI, which makes it unlikely that he would've developed restenosis. However he may have new coronary stenoses. I have recommended that he have a myocardial perfusion study.

## 2012-07-01 ENCOUNTER — Ambulatory Visit (HOSPITAL_COMMUNITY)
Admission: RE | Admit: 2012-07-01 | Discharge: 2012-07-01 | Disposition: A | Discharge status: Home / Self Care | Payer: Medicare Other | Source: Ambulatory Visit | Attending: Cardiovascular Disease | Admitting: Cardiovascular Disease

## 2012-07-01 DIAGNOSIS — R5381 Other malaise: Secondary | ICD-10-CM | POA: Insufficient documentation

## 2012-07-01 DIAGNOSIS — R002 Palpitations: Secondary | ICD-10-CM | POA: Insufficient documentation

## 2012-07-01 DIAGNOSIS — R079 Chest pain, unspecified: Secondary | ICD-10-CM

## 2012-07-01 DIAGNOSIS — R42 Dizziness and giddiness: Secondary | ICD-10-CM | POA: Insufficient documentation

## 2012-07-01 DIAGNOSIS — I2510 Atherosclerotic heart disease of native coronary artery without angina pectoris: Secondary | ICD-10-CM

## 2012-07-01 DIAGNOSIS — R5383 Other fatigue: Secondary | ICD-10-CM | POA: Insufficient documentation

## 2012-07-01 MED ORDER — REGADENOSON 0.4 MG/5ML IV SOLN
0.4000 mg | Freq: Once | INTRAVENOUS | Status: AC
  Administered 2012-07-01: 0.4 mg via INTRAVENOUS

## 2012-07-01 MED ORDER — TECHNETIUM TC 99M SESTAMIBI GENERIC - CARDIOLITE
10.7000 | Freq: Once | INTRAVENOUS | Status: AC | PRN
  Administered 2012-07-01: 11 via INTRAVENOUS

## 2012-07-01 MED ORDER — TECHNETIUM TC 99M SESTAMIBI GENERIC - CARDIOLITE
31.7000 | Freq: Once | INTRAVENOUS | Status: AC | PRN
  Administered 2012-07-01: 31.7 via INTRAVENOUS

## 2012-07-01 NOTE — Procedures (Addendum)
[facility] [address] [place] [place] [address] [phone]  Cardiology Nuclear Med Study  Louis Gutierrez is a 62 y.o. male     MRN : [id number]     DOB: [birth year]  Procedure Date: 07/01/2012  Nuclear Med Background Indication for Stress Test:  Stent Patency and PTCA Patency History:  COPD and CAD;MI-06/07/2010;STENT/PTCA--06/07/2010 Cardiac Risk Factors: Family History - CAD, Hypertension, Lipids, Obesity and Smoker  Symptoms:  Chest Pain, Fatigue, Light-Headedness, Palpitations and SOB   Nuclear Pre-Procedure Caffeine/Decaff Intake:  7:00pm NPO After: 5:00am   IV Site: R Forearm  IV 0.9% NS with Angio Cath:  22g  Chest Size (in):  42"  IV Started by: Emmit Pomfret, RN  Height: 5\' 11"  (1.803 m)  Cup Size: n/a  BMI:  Body mass index is 31.39 kg/(m^2). Weight:  225 lb (102.059 kg)   Tech Comments:  N/A    Nuclear Med Study 1 or 2 day study: 1 day  Stress Test Type:  Lexiscan  Order Authorizing Provider:  Thurmon Fair, MD   Resting Radionuclide: Technetium 65m Sestamibi  Resting Radionuclide Dose: 10.7 mCi   Stress Radionuclide:  Technetium 30m Sestamibi  Stress Radionuclide Dose: 31.7 mCi           Stress Protocol Rest HR: 56 Stress HR: 78  Rest BP: 148/90 Stress BP: 153/111  Exercise Time (min): n/a METS: n/a   Predicted Max HR: 159 bpm % Max HR: 49.06 bpm Rate Pressure Product: 21308  Dose of Adenosine (mg):  n/a Dose of Lexiscan: 0.4 mg  Dose of Atropine (mg): n/a Dose of Dobutamine: n/a mcg/kg/min (at max HR)  Stress Test Technologist: Esperanza Sheets, CCT Nuclear Technologist: Gonzella Lex, CNMT   Rest Procedure:  Myocardial perfusion imaging was performed at rest 45 minutes following the intravenous administration of Technetium 24m Sestamibi. Stress Procedure:  The patient received IV Lexiscan 0.4 mg over 15-seconds.  Technetium 59m Sestamibi injected at 30-seconds.  There were no significant changes  with Lexiscan.  Quantitative spect images were obtained after a 45 minute delay.  Transient Ischemic Dilatation (Normal <1.22):  0.98 Lung/Heart Ratio (Normal <0.45):  0.27 QGS EDV:  123 ml QGS ESV:  60 ml LV Ejection Fraction: 51%      Rest ECG: NSR, PVCs, old inferior MI, RBBB  Stress ECG: No significant change from baseline ECG  QPS Raw Data Images:  Normal; no motion artifact; normal heart/lung ratio. Stress Images:  There is decreased uptake in the inferior wall. Rest Images:  There is decreased uptake in the inferior wall. Subtraction (SDS):  There is a fixed defect that is most consistent with a previous infarction.  Impression Exercise Capacity:  Lexiscan with no exercise. BP Response:  Normal blood pressure response. Clinical Symptoms:  No significant symptoms noted. ECG Impression:  No significant ECG changes with Lexiscan. Comparison with Prior Nuclear Study: No significant change from previous study  Overall Impression:  Low risk stress nuclear study - old scar in the right coronary artery territory without significant area of reversible ischemia..  LV Wall Motion:  Mildly reduce left ventricular systolic function (EF=51%) due to inferior wall hypokinesis.   Thurmon Fair, MD  07/01/2012 7:00 PM

## 2012-07-05 ENCOUNTER — Telehealth: Payer: Self-pay | Admitting: *Deleted

## 2012-07-05 NOTE — Telephone Encounter (Signed)
Nuclear study results called to patient.  No change from previous study. Pt. Voiced understanding.

## 2012-07-05 NOTE — Telephone Encounter (Signed)
Message copied by Vita Barley on Mon Jul 05, 2012  9:17 AM ------      Message from: Thurmon Fair      Created: Fri Jul 02, 2012 11:01 AM       Old scar, no new areas of problems. Please let him know ------

## 2012-07-13 ENCOUNTER — Encounter: Payer: Self-pay | Admitting: Cardiovascular Disease

## 2012-09-22 ENCOUNTER — Emergency Department: Payer: Self-pay | Admitting: Internal Medicine

## 2012-09-22 LAB — BASIC METABOLIC PANEL
Anion Gap: 8 (ref 7–16)
BUN: 22 mg/dL — ABNORMAL HIGH (ref 7–18)
Calcium, Total: 9.1 mg/dL (ref 8.5–10.1)
Chloride: 104 mmol/L (ref 98–107)
Co2: 24 mmol/L (ref 21–32)
Creatinine: 1.27 mg/dL (ref 0.60–1.30)
EGFR (Non-African Amer.): >60
Glucose: 107 mg/dL — ABNORMAL HIGH (ref 65–99)
Potassium: 3.5 mmol/L (ref 3.5–5.1)
Sodium: 136 mmol/L (ref 136–145)

## 2012-09-22 LAB — CBC
MCH: 31.4 pg (ref 26.0–34.0)
MCHC: 35.2 g/dL (ref 32.0–36.0)
Platelet: 245 10*3/uL (ref 150–440)
RBC: 4.22 10*6/uL — ABNORMAL LOW (ref 4.40–5.90)
RDW: 15.3 % — ABNORMAL HIGH (ref 11.5–14.5)
WBC: 8.4 10*3/uL (ref 3.8–10.6)

## 2012-09-22 LAB — CK: CK, Total: 171 U/L (ref 35–232)

## 2012-09-22 LAB — TROPONIN I
Troponin-I: <0.02 ng/mL
Troponin-I: <0.02 ng/mL

## 2012-12-23 ENCOUNTER — Other Ambulatory Visit: Payer: Self-pay | Admitting: *Deleted

## 2012-12-23 MED ORDER — METOPROLOL TARTRATE 25 MG PO TABS: 25.0000 mg | ORAL_TABLET | Freq: Two times a day (BID) | ORAL | Status: DC

## 2012-12-23 NOTE — Telephone Encounter (Signed)
Rx was sent to pharmacy electronically. 

## 2013-01-20 DIAGNOSIS — I6930 Unspecified sequelae of cerebral infarction: Secondary | ICD-10-CM

## 2013-01-20 HISTORY — DX: Unspecified sequelae of cerebral infarction: I69.30

## 2013-04-04 ENCOUNTER — Observation Stay: Payer: Self-pay | Admitting: Specialist

## 2013-04-04 HISTORY — PX: CARDIAC CATHETERIZATION: SHX172

## 2013-04-04 LAB — CBC
HCT: 39 % — ABNORMAL LOW (ref 40.0–52.0)
HGB: 13.1 g/dL (ref 13.0–18.0)
MCH: 29.5 pg (ref 26.0–34.0)
MCHC: 33.5 g/dL (ref 32.0–36.0)
MCV: 88 fL (ref 80–100)
Platelet: 247 10*3/uL (ref 150–440)
RBC: 4.42 10*6/uL (ref 4.40–5.90)
RDW: 13.9 % (ref 11.5–14.5)
WBC: 5.8 10*3/uL (ref 3.8–10.6)

## 2013-04-04 LAB — COMPREHENSIVE METABOLIC PANEL
ALK PHOS: 118 U/L — AB
ALT: 15 U/L (ref 12–78)
ANION GAP: 5 — AB (ref 7–16)
Albumin: 3.5 g/dL (ref 3.4–5.0)
BUN: 13 mg/dL (ref 7–18)
Bilirubin,Total: 0.4 mg/dL (ref 0.2–1.0)
CHLORIDE: 104 mmol/L (ref 98–107)
Calcium, Total: 9 mg/dL (ref 8.5–10.1)
Co2: 27 mmol/L (ref 21–32)
Creatinine: 1.07 mg/dL (ref 0.60–1.30)
EGFR (African American): >60
Glucose: 110 mg/dL — ABNORMAL HIGH (ref 65–99)
Osmolality: 273 (ref 275–301)
Potassium: 3.9 mmol/L (ref 3.5–5.1)
SGOT(AST): 14 U/L — ABNORMAL LOW (ref 15–37)
SODIUM: 136 mmol/L (ref 136–145)
TOTAL PROTEIN: 6.9 g/dL (ref 6.4–8.2)

## 2013-04-04 LAB — CK TOTAL AND CKMB (NOT AT ARMC)
CK, TOTAL: 255 U/L
CK, Total: 274 U/L
CK-MB: 2.9 ng/mL (ref 0.5–3.6)
CK-MB: 2.6 ng/mL (ref 0.5–3.6)

## 2013-04-04 LAB — TROPONIN I
Troponin-I: <0.02 ng/mL
Troponin-I: <0.02 ng/mL

## 2013-04-05 LAB — CBC WITH DIFFERENTIAL/PLATELET
BASOS PCT: 1.1 %
Basophil #: 0.1 10*3/uL (ref 0.0–0.1)
EOS ABS: 0.1 10*3/uL (ref 0.0–0.7)
Eosinophil %: 2 %
HCT: 39.2 % — ABNORMAL LOW (ref 40.0–52.0)
HGB: 13 g/dL (ref 13.0–18.0)
Lymphocyte #: 1.6 10*3/uL (ref 1.0–3.6)
Lymphocyte %: 24.1 %
MCH: 29.2 pg (ref 26.0–34.0)
MCHC: 33.3 g/dL (ref 32.0–36.0)
MCV: 88 fL (ref 80–100)
MONO ABS: 0.7 x10 3/mm (ref 0.2–1.0)
Monocyte %: 10.7 %
Neutrophil #: 4 10*3/uL (ref 1.4–6.5)
Neutrophil %: 62.1 %
Platelet: 235 10*3/uL (ref 150–440)
RBC: 4.47 10*6/uL (ref 4.40–5.90)
RDW: 13.9 % (ref 11.5–14.5)
WBC: 6.5 10*3/uL (ref 3.8–10.6)

## 2013-04-05 LAB — BASIC METABOLIC PANEL
Anion Gap: 2 — ABNORMAL LOW (ref 7–16)
BUN: 11 mg/dL (ref 7–18)
CALCIUM: 8.6 mg/dL (ref 8.5–10.1)
Chloride: 106 mmol/L (ref 98–107)
Co2: 30 mmol/L (ref 21–32)
Creatinine: 1.06 mg/dL (ref 0.60–1.30)
EGFR (African American): >60
EGFR (Non-African Amer.): >60
Glucose: 90 mg/dL (ref 65–99)
OSMOLALITY: 275 (ref 275–301)
Potassium: 3.9 mmol/L (ref 3.5–5.1)
SODIUM: 138 mmol/L (ref 136–145)

## 2013-04-05 LAB — LIPID PANEL
CHOLESTEROL: 112 mg/dL (ref 0–200)
HDL Cholesterol: 28 mg/dL — ABNORMAL LOW (ref 40–60)
Ldl Cholesterol, Calc: 56 mg/dL (ref 0–100)
Triglycerides: 138 mg/dL (ref 0–200)
VLDL CHOLESTEROL, CALC: 28 mg/dL (ref 5–40)

## 2013-04-05 LAB — MAGNESIUM: Magnesium: 1.8 mg/dL

## 2013-04-05 LAB — TSH: THYROID STIMULATING HORM: 1.8 u[IU]/mL

## 2013-04-05 LAB — HEMOGLOBIN A1C: Hemoglobin A1C: 6.1 % (ref 4.2–6.3)

## 2013-05-20 DIAGNOSIS — I639 Cerebral infarction, unspecified: Secondary | ICD-10-CM

## 2013-05-20 HISTORY — DX: Cerebral infarction, unspecified: I63.9

## 2013-05-20 HISTORY — PX: CARDIAC CATHETERIZATION: SHX172

## 2013-05-21 ENCOUNTER — Inpatient Hospital Stay: Payer: Self-pay | Admitting: Internal Medicine

## 2013-05-21 LAB — BASIC METABOLIC PANEL
Anion Gap: 9 (ref 7–16)
BUN: 17 mg/dL (ref 7–18)
Calcium, Total: 9.1 mg/dL (ref 8.5–10.1)
Chloride: 108 mmol/L — ABNORMAL HIGH (ref 98–107)
Co2: 27 mmol/L (ref 21–32)
Creatinine: 0.76 mg/dL (ref 0.60–1.30)
EGFR (African American): >60
Glucose: 100 mg/dL — ABNORMAL HIGH (ref 65–99)
OSMOLALITY: 288 (ref 275–301)
POTASSIUM: 4.6 mmol/L (ref 3.5–5.1)
Sodium: 144 mmol/L (ref 136–145)

## 2013-05-21 LAB — CBC
HCT: 37.9 % — AB (ref 40.0–52.0)
HGB: 12.1 g/dL — AB (ref 13.0–18.0)
MCH: 28.6 pg (ref 26.0–34.0)
MCHC: 32 g/dL (ref 32.0–36.0)
MCV: 89 fL (ref 80–100)
Platelet: 419 10*3/uL (ref 150–440)
RBC: 4.24 10*6/uL — ABNORMAL LOW (ref 4.40–5.90)
RDW: 15.8 % — ABNORMAL HIGH (ref 11.5–14.5)
WBC: 7.3 10*3/uL (ref 3.8–10.6)

## 2013-05-21 LAB — TROPONIN I

## 2013-05-22 LAB — URINALYSIS, COMPLETE
BACTERIA: NONE SEEN
Bilirubin,UR: NEGATIVE
Blood: NEGATIVE
GLUCOSE, UR: NEGATIVE mg/dL (ref 0–75)
Ketone: NEGATIVE
Leukocyte Esterase: NEGATIVE
Nitrite: NEGATIVE
PH: 6 (ref 4.5–8.0)
Protein: NEGATIVE
RBC,UR: 1 /HPF (ref 0–5)
Specific Gravity: 1.008 (ref 1.003–1.030)
Squamous Epithelial: NONE SEEN
WBC UR: <1 /HPF (ref 0–5)

## 2013-05-22 LAB — CBC WITH DIFFERENTIAL/PLATELET
BASOS ABS: 0 10*3/uL (ref 0.0–0.1)
BASOS PCT: 0.6 %
EOS ABS: 0.2 10*3/uL (ref 0.0–0.7)
Eosinophil %: 3.4 %
HCT: 35.2 % — AB (ref 40.0–52.0)
HGB: 11.3 g/dL — ABNORMAL LOW (ref 13.0–18.0)
LYMPHS PCT: 17.7 %
Lymphocyte #: 1.2 10*3/uL (ref 1.0–3.6)
MCH: 28.5 pg (ref 26.0–34.0)
MCHC: 32 g/dL (ref 32.0–36.0)
MCV: 89 fL (ref 80–100)
Monocyte #: 0.6 x10 3/mm (ref 0.2–1.0)
Monocyte %: 9.3 %
NEUTROS ABS: 4.6 10*3/uL (ref 1.4–6.5)
Neutrophil %: 69 %
Platelet: 343 10*3/uL (ref 150–440)
RBC: 3.95 10*6/uL — ABNORMAL LOW (ref 4.40–5.90)
RDW: 15.9 % — ABNORMAL HIGH (ref 11.5–14.5)
WBC: 6.7 10*3/uL (ref 3.8–10.6)

## 2013-05-22 LAB — TROPONIN I

## 2013-05-22 LAB — BASIC METABOLIC PANEL
Anion Gap: 4 — ABNORMAL LOW (ref 7–16)
BUN: 16 mg/dL (ref 7–18)
CHLORIDE: 106 mmol/L (ref 98–107)
CREATININE: 0.77 mg/dL (ref 0.60–1.30)
Calcium, Total: 9.2 mg/dL (ref 8.5–10.1)
Co2: 30 mmol/L (ref 21–32)
EGFR (Non-African Amer.): >60
Glucose: 102 mg/dL — ABNORMAL HIGH (ref 65–99)
Osmolality: 281 (ref 275–301)
POTASSIUM: 4.8 mmol/L (ref 3.5–5.1)
SODIUM: 140 mmol/L (ref 136–145)

## 2013-05-22 LAB — CK-MB
CK-MB: 2.1 ng/mL (ref 0.5–3.6)
CK-MB: 1.7 ng/mL (ref 0.5–3.6)

## 2013-05-22 LAB — LIPID PANEL
CHOLESTEROL: 110 mg/dL (ref 0–200)
HDL Cholesterol: 17 mg/dL — ABNORMAL LOW (ref 40–60)
LDL CHOLESTEROL, CALC: 61 mg/dL (ref 0–100)
TRIGLYCERIDES: 162 mg/dL (ref 0–200)
VLDL Cholesterol, Calc: 32 mg/dL (ref 5–40)

## 2013-05-22 LAB — TSH: THYROID STIMULATING HORM: 1.08 u[IU]/mL

## 2013-05-23 ENCOUNTER — Telehealth: Payer: Self-pay | Admitting: *Deleted

## 2013-05-23 NOTE — Telephone Encounter (Signed)
Patient called stating he had a stoke on Saturday and was admitted to [facility] - discharged today.  Called her to asked why they took him off the metoprolol and HCTZ.  Told patient I do not know but to keep a close eye on his BP readings.  States he will and let us know if he has any problems and keep appt in July.  Will get records from [place].  Patient voiced understanding.

## 2013-05-23 NOTE — Telephone Encounter (Signed)
Pts wife called in regards to some questions about her husband.   [place]

## 2013-05-24 NOTE — Telephone Encounter (Signed)
Mr Stokke's wife calling.  Said she not comfortable stopping his bp meds and wants to talk to nurse.  He doesn't completely remember what he was told yesterday.  Please call

## 2013-05-25 NOTE — Telephone Encounter (Signed)
Wife is still concerned Louis Gutierrez was taken off his BP med and HCTZ,  They are checking his BP readings which she states have been normal and no peripheral edema.  Told her as long as his #'s are normal there should be nothing to worry about.  Voiced understanding and will keep a log of BP's and HR's and let us know if they concerned about anything.

## 2013-07-07 ENCOUNTER — Other Ambulatory Visit: Payer: Self-pay

## 2013-07-07 MED ORDER — PRAVASTATIN SODIUM 40 MG PO TABS: 40.0000 mg | ORAL_TABLET | Freq: Every day | ORAL | Status: DC

## 2013-07-07 NOTE — Telephone Encounter (Signed)
Rx was sent to pharmacy electronically. 

## 2013-07-14 ENCOUNTER — Encounter: Payer: Self-pay | Admitting: Family Medicine

## 2013-07-19 ENCOUNTER — Other Ambulatory Visit: Payer: Self-pay | Admitting: *Deleted

## 2013-07-19 MED ORDER — CLOPIDOGREL BISULFATE 75 MG PO TABS: 75.0000 mg | ORAL_TABLET | Freq: Every day | ORAL | Status: DC

## 2013-07-19 NOTE — Telephone Encounter (Signed)
Rx was sent to pharmacy electronically. 

## 2013-07-28 ENCOUNTER — Other Ambulatory Visit: Payer: Self-pay | Admitting: *Deleted

## 2013-07-28 MED ORDER — RAMIPRIL 10 MG PO CAPS: 10.0000 mg | ORAL_CAPSULE | Freq: Every day | ORAL | Status: DC

## 2013-08-02 ENCOUNTER — Ambulatory Visit: Payer: Medicare Other | Admitting: Cardiovascular Disease

## 2013-08-04 ENCOUNTER — Encounter: Payer: Self-pay | Admitting: Cardiovascular Disease

## 2013-08-04 ENCOUNTER — Ambulatory Visit (INDEPENDENT_AMBULATORY_CARE_PROVIDER_SITE_OTHER): Payer: Medicare HMO | Admitting: Cardiovascular Disease

## 2013-08-04 VITALS — BP 140/91 | HR 67 | Resp 16 | Ht 71.0 in | Wt 201.5 lb

## 2013-08-04 DIAGNOSIS — Z720 Tobacco use: Secondary | ICD-10-CM

## 2013-08-04 DIAGNOSIS — I480 Paroxysmal atrial fibrillation: Secondary | ICD-10-CM

## 2013-08-04 DIAGNOSIS — I213 ST elevation (STEMI) myocardial infarction of unspecified site: Secondary | ICD-10-CM

## 2013-08-04 DIAGNOSIS — E782 Mixed hyperlipidemia: Secondary | ICD-10-CM

## 2013-08-04 DIAGNOSIS — I6350 Cerebral infarction due to unspecified occlusion or stenosis of unspecified cerebral artery: Secondary | ICD-10-CM

## 2013-08-04 DIAGNOSIS — I219 Acute myocardial infarction, unspecified: Secondary | ICD-10-CM

## 2013-08-04 DIAGNOSIS — I10 Essential (primary) hypertension: Secondary | ICD-10-CM

## 2013-08-04 DIAGNOSIS — I639 Cerebral infarction, unspecified: Secondary | ICD-10-CM

## 2013-08-04 DIAGNOSIS — F17200 Nicotine dependence, unspecified, uncomplicated: Secondary | ICD-10-CM

## 2013-08-04 DIAGNOSIS — I2510 Atherosclerotic heart disease of native coronary artery without angina pectoris: Secondary | ICD-10-CM

## 2013-08-04 DIAGNOSIS — I4891 Unspecified atrial fibrillation: Secondary | ICD-10-CM

## 2013-08-04 NOTE — Patient Instructions (Signed)
Dr. Sallyanne Kuster wants you to have a Loop Recorder implanted at [facility], which is an outpatient procedure.

## 2013-08-06 ENCOUNTER — Encounter: Payer: Self-pay | Admitting: Cardiovascular Disease

## 2013-08-06 DIAGNOSIS — I639 Cerebral infarction, unspecified: Secondary | ICD-10-CM | POA: Insufficient documentation

## 2013-08-06 NOTE — Assessment & Plan Note (Signed)
It is more important than ever that he completely and permanently quit smoking. He seems more interested in trying this now. He stopped short making a full commitment.

## 2013-08-06 NOTE — Assessment & Plan Note (Signed)
I don't think that we have clearly establish the cause of his stroke. He has known vascular disease but there was no evidence of carotid lesions. I am concerned that we may be missing a diagnosis of atrial fibrillation in a patient with documented biatrial dilatation. His stroke is in the typical distribution of an embolic event. I think we need to monitor him for possible atrial arrhythmia. We discussed event monitoring versus loop recorder implantation and decided that the latter would be better.This procedure has been fully reviewed with the patient and written informed consent has been obtained.

## 2013-08-06 NOTE — Assessment & Plan Note (Signed)
The biggest abnormality is the low HDL cholesterol for which we do not have good pharmacological treatment. He is encouraged to lose weight and improve his appetite, especially reducing the intake of starches and simple carbohydrates.

## 2013-08-06 NOTE — Progress Notes (Signed)
Patient ID: Louis Gutierrez, male   DOB: July 29, 1950, 63 y.o.   MRN: [id number]     Reason for office visit CAD   Since I last saw Louis Gutierrez in June of 2014 he has been admitted twice to [facility]. Once he presented with chest pain and apparently underwent a repeat angiogram that showed no evidence of new coronary lesions and patent stent in the right coronary artery. In May of 2015 he was admitted with a stroke, presented with expressive aphasia and right hemiparesis. He had evidence of an acute moderate sized left lenticulostriate infarction in the territory of the middle cerebral artery. His right hemiparesis  is almost completely resolved. He has noticed that the strength in his right hand is not quite what it was before the stroke and rarely has difficulty speaking certain words. He has returned to work.  During that hospitalization he had an echocardiogram that showed an ejection fraction of 55-60% and biatrial dilatation. He did not have any evidence of carotid stenosis. His lipid profile showed an excellent LDL cholesterol of 61, but as before a severely low HDL of only 17.  He was discharged on treatment with aspirin and clopidogrel. He was told that this stroke happened because he stopped his aspirin, even though he was still taking clopidogrel.  He presented with ST segment elevation myocardial infarction in May of 2012 almost exactly 2 years ago. At that time had an inferior wall infarction, manifesting as unstable angina pectoris and eventually a syncopal episode. The right coronary artery was totally occluded and revascularization was difficult but eventually he received a 3.5 x 24 mm Promus drug-eluting stent to the mid RCA. Also noted was 50% ostial stenosis of the first diagonal artery and 40% ostial stenosis of the second diagonal artery branches of the LAD, as well as total occlusion of a small left circumflex system. There was evidence of inferolateral hypokinesis and  ejection fraction of 45-50% at that time.  He has no symptoms of dyspnea, angina, syncope or palpitations.   Allergies  Allergen Reactions  . Penicillins     Current Outpatient Prescriptions  Medication Sig Dispense Refill  . acetaminophen (TYLENOL) 500 MG tablet Take 500 mg by mouth as needed for pain.      Marland Kitchen albuterol (PROVENTIL HFA;VENTOLIN HFA) 108 (90 BASE) MCG/ACT inhaler Inhale 1 puff into the lungs 2 (two) times daily.      Marland Kitchen aspirin EC 81 MG tablet Take 81 mg by mouth daily.      . beclomethasone (QVAR) 80 MCG/ACT inhaler Inhale 1 puff into the lungs 2 (two) times daily.      . clopidogrel (PLAVIX) 75 MG tablet Take 1 tablet (75 mg total) by mouth daily.  30 tablet  1  . isosorbide mononitrate (IMDUR) 30 MG 24 hr tablet Take 1 tablet (30 mg total) by mouth daily.  90 tablet  3  . niacin 500 MG tablet Take 500 mg by mouth daily.      . nitroGLYCERIN (NITROSTAT) 0.4 MG SL tablet Place 0.4 mg under the tongue every 5 (five) minutes as needed for chest pain.      Marland Kitchen PARoxetine (PAXIL) 20 MG tablet Take 20 mg by mouth 2 (two) times daily.      . pravastatin (PRAVACHOL) 40 MG tablet Take 1 tablet (40 mg total) by mouth daily.  30 tablet  0  . ramipril (ALTACE) 10 MG capsule Take 1 capsule (10 mg total) by mouth daily.  30 capsule  1  . tiotropium (SPIRIVA) 18 MCG inhalation capsule Place 18 mcg into inhaler and inhale daily.      . traZODone (DESYREL) 100 MG tablet Take 100 mg by mouth at bedtime as needed for sleep.       No current facility-administered medications for this visit.    Past Medical History  Diagnosis Date  . Hypertension 06/10/2010    ECHO at [facility] - see Media tab  . STEMI (ST elevation myocardial infarction) 06/07/2010  . Mixed hyperlipidemia   . OSA (obstructive sleep apnea)   . COPD (chronic obstructive pulmonary disease)   . Tobacco abuse   . CAD (coronary artery disease)   . Obesity     Past Surgical History  Procedure Laterality Date  . Cardiac  catheterization  06/07/2010    50 % ostial narrowing in 1st diagonal from LAD, 2nd diagonal had 40% ostial narrowing; AV groove circumflex was diminutive and occluded proximally; RCA totally occluded in mid segment - stented with 3.5 x 55m Promus Element DES  - See Media tab    Family History  Problem Relation Age of Onset  . Cancer Mother   . Stroke Maternal Grandmother   . Diabetes Brother   . Coronary artery disease Brother     History   Social History  . Marital Status: Married    Spouse Name: N/A    Number of Children: N/A  . Years of Education: N/A   Occupational History  . Not on file.   Social History Main Topics  . Smoking status: Current Every Day Smoker -- 1.00 packs/day for 48 years    Types: Cigarettes  . Smokeless tobacco: Not on file  . Alcohol Use: No  . Drug Use: No  . Sexual Activity: Not on file   Other Topics Concern  . Not on file   Social History Narrative  . No narrative on file    Review of systems: The patient specifically denies any chest pain at rest or with exertion, dyspnea at rest or with exertion, orthopnea, paroxysmal nocturnal dyspnea, syncope, palpitations, focal neurological deficits, intermittent claudication, lower extremity edema, unexplained weight gain, cough, hemoptysis or wheezing.  The patient also denies abdominal pain, nausea, vomiting, dysphagia, diarrhea, constipation, polyuria, polydipsia, dysuria, hematuria, frequency, urgency, abnormal bleeding or bruising, fever, chills, unexpected weight changes, mood swings, change in skin or hair texture, change in voice quality, auditory or visual problems, allergic reactions or rashes, new musculoskeletal complaints other than usual "aches and pains".  Minimal residual right hand weakness  PHYSICAL EXAM BP 144/100  Pulse 67  Resp 16  Ht 5' 11"  (1.803 m)  Wt 201 lb 8 oz (91.4 kg)  BMI 28.12 kg/m2  General: Alert, oriented x3, no distress Head: no evidence of trauma, PERRL,  EOMI, no exophtalmos or lid lag, no myxedema, no xanthelasma; normal ears, nose and oropharynx Neck: normal jugular venous pulsations and no hepatojugular reflux; brisk carotid pulses without delay and no carotid bruits Chest: clear to auscultation, no signs of consolidation by percussion or palpation, normal fremitus, symmetrical and full respiratory excursions Cardiovascular: normal position and quality of the apical impulse, regular rhythm, normal first and second heart sounds, no murmurs, rubs or gallops Abdomen: no tenderness or distention, no masses by palpation, no abnormal pulsatility or arterial bruits, normal bowel sounds, no hepatosplenomegaly Extremities: no clubbing, cyanosis or edema; 2+ radial, ulnar and brachial pulses bilaterally; 2+ right femoral, posterior tibial and dorsalis pedis pulses; 2+ left femoral, posterior tibial and dorsalis  pedis pulses; no subclavian or femoral bruits Neurological: grossly nonfocal, his grip appears to be symmetrical to me, he is right-hand-dominant it is possible that his right grip is stronger before.   EKG: Normal sinus rhythm, old inferoposterior infarct, incomplete right bundle branch block, no acute repolarization abnormalities  Lipid Panel     Component Value Date/Time   CHOL 170 06/22/2012 1020   TRIG 172* 06/22/2012 1020   HDL 32* 06/22/2012 1020   CHOLHDL 5.3 06/22/2012 1020   VLDL 34 06/22/2012 1020   LDLCALC 104* 06/22/2012 1020    BMET    Component Value Date/Time   NA 139 06/22/2012 1020   K 4.9 06/22/2012 1020   CL 107 06/22/2012 1020   CO2 29 06/22/2012 1020   GLUCOSE 94 06/22/2012 1020   BUN 16 06/22/2012 1020   CREATININE 1.04 06/22/2012 1020   CREATININE 0.99 06/11/2010 0400   CALCIUM 9.5 06/22/2012 1020   GFRNONAA >60 06/11/2010 0400   GFRAA  Value: >60        The eGFR has been calculated using the MDRD equation. This calculation has not been validated in all clinical situations. eGFR's persistently <60 mL/min signify possible Chronic Kidney  Disease. 06/11/2010 0400     ASSESSMENT AND PLAN Stroke of unknown cause I don't think that we have clearly establish the cause of his stroke. He has known vascular disease but there was no evidence of carotid lesions. I am concerned that we may be missing a diagnosis of atrial fibrillation in a patient with documented biatrial dilatation. His stroke is in the typical distribution of an embolic event. I think we need to monitor him for possible atrial arrhythmia. We discussed event monitoring versus loop recorder implantation and decided that the latter would be better.This procedure has been fully reviewed with the patient and written informed consent has been obtained.   CAD (coronary artery disease) Currently asymptomatic. Left ventricular systolic function seems to have returned to normal. He still has very obvious Q waves on EKG but all motion was described as normal on his echocardiogram.  Mixed hyperlipidemia The biggest abnormality is the low HDL cholesterol for which we do not have good pharmacological treatment. He is encouraged to lose weight and improve his appetite, especially reducing the intake of starches and simple carbohydrates.  Hypertension A couple of his antihypertensives were stopped at the time of his stroke. Blood pressure borderline high today. We'll have to gradually reinstate these meds. We'll see what his blood pressure is when he comes in for his loop recorder implantation. Would like to restart the beta blocker first.  Tobacco abuse It is more important than ever that he completely and permanently quit smoking. He seems more interested in trying this now. He stopped short making a full commitment.   Patient Instructions  Dr. Sallyanne Kuster wants you to have a Loop Recorder implanted at [facility], which is an outpatient procedure.    Orders Placed This Encounter  Procedures  . EKG 12-Lead  . LOOP RECORDER IMPLANT   Meds ordered this encounter  Medications    . traZODone (DESYREL) 100 MG tablet    Sig: Take 100 mg by mouth at bedtime as needed for sleep.    Holli Humbles, MD, [place] [phone] office [phone] pager

## 2013-08-06 NOTE — Assessment & Plan Note (Signed)
A couple of his antihypertensives were stopped at the time of his stroke. Blood pressure borderline high today. We'll have to gradually reinstate these meds. We'll see what his blood pressure is when he comes in for his loop recorder implantation. Would like to restart the beta blocker first.

## 2013-08-06 NOTE — Assessment & Plan Note (Signed)
Currently asymptomatic. Left ventricular systolic function seems to have returned to normal. He still has very obvious Q waves on EKG but all motion was described as normal on his echocardiogram.

## 2013-08-09 ENCOUNTER — Encounter (HOSPITAL_COMMUNITY): Payer: Self-pay | Admitting: Pharmacy Technician

## 2013-08-10 ENCOUNTER — Other Ambulatory Visit: Payer: Self-pay | Admitting: *Deleted

## 2013-08-11 ENCOUNTER — Other Ambulatory Visit: Payer: Self-pay

## 2013-08-11 MED ORDER — PRAVASTATIN SODIUM 40 MG PO TABS: 40.0000 mg | ORAL_TABLET | Freq: Every day | ORAL | Status: DC

## 2013-08-11 NOTE — Telephone Encounter (Signed)
Rx was sent to pharmacy electronically. 

## 2013-08-16 ENCOUNTER — Ambulatory Visit (HOSPITAL_COMMUNITY)
Admission: RE | Admit: 2013-08-16 | Discharge: 2013-08-16 | Disposition: A | Discharge status: Home / Self Care | Payer: Medicare HMO | Source: Ambulatory Visit | Attending: Cardiovascular Disease | Admitting: Cardiovascular Disease

## 2013-08-16 ENCOUNTER — Encounter (HOSPITAL_COMMUNITY)
Admission: RE | Disposition: A | Discharge status: Home / Self Care | Payer: Self-pay | Source: Ambulatory Visit | Attending: Cardiovascular Disease

## 2013-08-16 DIAGNOSIS — I69959 Hemiplegia and hemiparesis following unspecified cerebrovascular disease affecting unspecified side: Secondary | ICD-10-CM | POA: Diagnosis not present

## 2013-08-16 DIAGNOSIS — J449 Chronic obstructive pulmonary disease, unspecified: Secondary | ICD-10-CM | POA: Insufficient documentation

## 2013-08-16 DIAGNOSIS — Z7982 Long term (current) use of aspirin: Secondary | ICD-10-CM | POA: Diagnosis not present

## 2013-08-16 DIAGNOSIS — I6350 Cerebral infarction due to unspecified occlusion or stenosis of unspecified cerebral artery: Secondary | ICD-10-CM

## 2013-08-16 DIAGNOSIS — F17200 Nicotine dependence, unspecified, uncomplicated: Secondary | ICD-10-CM | POA: Diagnosis not present

## 2013-08-16 DIAGNOSIS — G4733 Obstructive sleep apnea (adult) (pediatric): Secondary | ICD-10-CM | POA: Diagnosis not present

## 2013-08-16 DIAGNOSIS — Z6828 Body mass index (BMI) 28.0-28.9, adult: Secondary | ICD-10-CM | POA: Diagnosis not present

## 2013-08-16 DIAGNOSIS — Z9861 Coronary angioplasty status: Secondary | ICD-10-CM | POA: Insufficient documentation

## 2013-08-16 DIAGNOSIS — I10 Essential (primary) hypertension: Secondary | ICD-10-CM | POA: Insufficient documentation

## 2013-08-16 DIAGNOSIS — E782 Mixed hyperlipidemia: Secondary | ICD-10-CM | POA: Diagnosis not present

## 2013-08-16 DIAGNOSIS — I2510 Atherosclerotic heart disease of native coronary artery without angina pectoris: Secondary | ICD-10-CM | POA: Diagnosis not present

## 2013-08-16 DIAGNOSIS — I252 Old myocardial infarction: Secondary | ICD-10-CM | POA: Diagnosis not present

## 2013-08-16 DIAGNOSIS — Z7902 Long term (current) use of antithrombotics/antiplatelets: Secondary | ICD-10-CM | POA: Diagnosis not present

## 2013-08-16 DIAGNOSIS — E669 Obesity, unspecified: Secondary | ICD-10-CM | POA: Diagnosis not present

## 2013-08-16 DIAGNOSIS — J4489 Other specified chronic obstructive pulmonary disease: Secondary | ICD-10-CM | POA: Insufficient documentation

## 2013-08-16 DIAGNOSIS — I6990 Unspecified sequelae of unspecified cerebrovascular disease: Secondary | ICD-10-CM | POA: Diagnosis present

## 2013-08-16 DIAGNOSIS — I639 Cerebral infarction, unspecified: Secondary | ICD-10-CM

## 2013-08-16 HISTORY — PX: LOOP RECORDER IMPLANT: SHX5954

## 2013-08-16 SURGERY — LOOP RECORDER IMPLANT
Anesthesia: LOCAL

## 2013-08-16 MED ORDER — LIDOCAINE-EPINEPHRINE 1 %-1:100000 IJ SOLN
INTRAMUSCULAR | Status: AC
  Filled 2013-08-16: qty 1

## 2013-08-16 NOTE — Discharge Instructions (Signed)
Incision Care °An incision is when a surgeon cuts into your body tissues. After surgery, the incision needs to be cared for properly to prevent infection.  °HOME CARE INSTRUCTIONS  °· Take all medicine as directed by your caregiver. Only take over-the-counter or prescription medicines for pain, discomfort, or fever as directed by your caregiver. °· Do not remove your bandage (dressing) or get your incision wet until your surgeon gives you permission. In the event that your dressing becomes wet, dirty, or starts to smell, change the dressing and call your surgeon for instructions as soon as possible. °· Take showers. Do not take tub baths, swim, or do anything that may soak the wound until it is healed. °· Resume your normal diet and activities as directed or allowed. °· Avoid lifting any weight until you are instructed otherwise. °· Use anti-itch antihistamine medicine as directed by your caregiver. The wound may itch when it is healing. Do not pick or scratch at the wound. °· Follow up with your caregiver for stitch (suture) or staple removal as directed. °· Drink enough fluids to keep your urine clear or pale yellow. °SEEK MEDICAL CARE IF:  °· You have redness, swelling, or increasing pain in the wound that is not controlled with medicine. °· You have drainage, blood, or pus coming from the wound that lasts longer than 1 day. °· You develop muscle aches, chills, or a general ill feeling. °· You notice a bad smell coming from the wound or dressing. °· Your wound edges separate after the sutures, staples, or skin adhesive strips have been removed. °· You develop persistent nausea or vomiting. °SEEK IMMEDIATE MEDICAL CARE IF:  °· You have a fever. °· You develop a rash. °· You develop dizzy episodes or faint while standing. °· You have difficulty breathing. °· You develop any reaction or side effects to medicine given. °MAKE SURE YOU:  °· Understand these instructions. °· Will watch your condition. °· Will get help  right away if you are not doing well or get worse. °Document Released: 07/26/2004 Document Revised: 03/31/2011 Document Reviewed: 03/02/2013 °[employer]® Patient Information ©2015 [employer]. This information is not intended to replace advice given to you by your health care provider. Make sure you discuss any questions you have with your health care provider. ° ° °

## 2013-08-16 NOTE — H&P (View-Only) (Signed)
Patient ID: Louis Gutierrez, male   DOB: [birth year], 63 y.o.   MRN: [id number]     Reason for office visit CAD   Since I last saw Louis Gutierrez in June of 2014 he has been admitted twice to [facility]. Once he presented with chest pain and apparently underwent a repeat angiogram that showed no evidence of new coronary lesions and patent stent in the right coronary artery. In May of 2015 he was admitted with a stroke, presented with expressive aphasia and right hemiparesis. He had evidence of an acute moderate sized left lenticulostriate infarction in the territory of the middle cerebral artery. His right hemiparesis  is almost completely resolved. He has noticed that the strength in his right hand is not quite what it was before the stroke and rarely has difficulty speaking certain words. He has returned to work.  During that hospitalization he had an echocardiogram that showed an ejection fraction of 55-60% and biatrial dilatation. He did not have any evidence of carotid stenosis. His lipid profile showed an excellent LDL cholesterol of 61, but as before a severely low HDL of only 17.  He was discharged on treatment with aspirin and clopidogrel. He was told that this stroke happened because he stopped his aspirin, even though he was still taking clopidogrel.  He presented with ST segment elevation myocardial infarction in May of 2012 almost exactly 2 years ago. At that time had an inferior wall infarction, manifesting as unstable angina pectoris and eventually a syncopal episode. The right coronary artery was totally occluded and revascularization was difficult but eventually he received a 3.5 x 24 mm Promus drug-eluting stent to the mid RCA. Also noted was 50% ostial stenosis of the first diagonal artery and 40% ostial stenosis of the second diagonal artery branches of the LAD, as well as total occlusion of a small left circumflex system. There was evidence of inferolateral hypokinesis and  ejection fraction of 45-50% at that time.  He has no symptoms of dyspnea, angina, syncope or palpitations.   Allergies  Allergen Reactions  . Penicillins     Current Outpatient Prescriptions  Medication Sig Dispense Refill  . acetaminophen (TYLENOL) 500 MG tablet Take 500 mg by mouth as needed for pain.      Marland Kitchen albuterol (PROVENTIL HFA;VENTOLIN HFA) 108 (90 BASE) MCG/ACT inhaler Inhale 1 puff into the lungs 2 (two) times daily.      Marland Kitchen aspirin EC 81 MG tablet Take 81 mg by mouth daily.      . beclomethasone (QVAR) 80 MCG/ACT inhaler Inhale 1 puff into the lungs 2 (two) times daily.      . clopidogrel (PLAVIX) 75 MG tablet Take 1 tablet (75 mg total) by mouth daily.  30 tablet  1  . isosorbide mononitrate (IMDUR) 30 MG 24 hr tablet Take 1 tablet (30 mg total) by mouth daily.  90 tablet  3  . niacin 500 MG tablet Take 500 mg by mouth daily.      . nitroGLYCERIN (NITROSTAT) 0.4 MG SL tablet Place 0.4 mg under the tongue every 5 (five) minutes as needed for chest pain.      Marland Kitchen PARoxetine (PAXIL) 20 MG tablet Take 20 mg by mouth 2 (two) times daily.      . pravastatin (PRAVACHOL) 40 MG tablet Take 1 tablet (40 mg total) by mouth daily.  30 tablet  0  . ramipril (ALTACE) 10 MG capsule Take 1 capsule (10 mg total) by mouth daily.  30 capsule  1  . tiotropium (SPIRIVA) 18 MCG inhalation capsule Place 18 mcg into inhaler and inhale daily.      . traZODone (DESYREL) 100 MG tablet Take 100 mg by mouth at bedtime as needed for sleep.       No current facility-administered medications for this visit.    Past Medical History  Diagnosis Date  . Hypertension 06/10/2010    ECHO at [facility] - see Media tab  . STEMI (ST elevation myocardial infarction) 06/07/2010  . Mixed hyperlipidemia   . OSA (obstructive sleep apnea)   . COPD (chronic obstructive pulmonary disease)   . Tobacco abuse   . CAD (coronary artery disease)   . Obesity     Past Surgical History  Procedure Laterality Date  . Cardiac  catheterization  06/07/2010    50 % ostial narrowing in 1st diagonal from LAD, 2nd diagonal had 40% ostial narrowing; AV groove circumflex was diminutive and occluded proximally; RCA totally occluded in mid segment - stented with 3.5 x 37m Promus Element DES  - See Media tab    Family History  Problem Relation Age of Onset  . Cancer Mother   . Stroke Maternal Grandmother   . Diabetes Brother   . Coronary artery disease Brother     History   Social History  . Marital Status: Married    Spouse Name: N/A    Number of Children: N/A  . Years of Education: N/A   Occupational History  . Not on file.   Social History Main Topics  . Smoking status: Current Every Day Smoker -- 1.00 packs/day for 48 years    Types: Cigarettes  . Smokeless tobacco: Not on file  . Alcohol Use: No  . Drug Use: No  . Sexual Activity: Not on file   Other Topics Concern  . Not on file   Social History Narrative  . No narrative on file    Review of systems: The patient specifically denies any chest pain at rest or with exertion, dyspnea at rest or with exertion, orthopnea, paroxysmal nocturnal dyspnea, syncope, palpitations, focal neurological deficits, intermittent claudication, lower extremity edema, unexplained weight gain, cough, hemoptysis or wheezing.  The patient also denies abdominal pain, nausea, vomiting, dysphagia, diarrhea, constipation, polyuria, polydipsia, dysuria, hematuria, frequency, urgency, abnormal bleeding or bruising, fever, chills, unexpected weight changes, mood swings, change in skin or hair texture, change in voice quality, auditory or visual problems, allergic reactions or rashes, new musculoskeletal complaints other than usual "aches and pains".  Minimal residual right hand weakness  PHYSICAL EXAM BP 144/100  Pulse 67  Resp 16  Ht 5' 11"  (1.803 m)  Wt 201 lb 8 oz (91.4 kg)  BMI 28.12 kg/m2  General: Alert, oriented x3, no distress Head: no evidence of trauma, PERRL,  EOMI, no exophtalmos or lid lag, no myxedema, no xanthelasma; normal ears, nose and oropharynx Neck: normal jugular venous pulsations and no hepatojugular reflux; brisk carotid pulses without delay and no carotid bruits Chest: clear to auscultation, no signs of consolidation by percussion or palpation, normal fremitus, symmetrical and full respiratory excursions Cardiovascular: normal position and quality of the apical impulse, regular rhythm, normal first and second heart sounds, no murmurs, rubs or gallops Abdomen: no tenderness or distention, no masses by palpation, no abnormal pulsatility or arterial bruits, normal bowel sounds, no hepatosplenomegaly Extremities: no clubbing, cyanosis or edema; 2+ radial, ulnar and brachial pulses bilaterally; 2+ right femoral, posterior tibial and dorsalis pedis pulses; 2+ left femoral, posterior tibial and dorsalis  pedis pulses; no subclavian or femoral bruits Neurological: grossly nonfocal, his grip appears to be symmetrical to me, he is right-hand-dominant it is possible that his right grip is stronger before.   EKG: Normal sinus rhythm, old inferoposterior infarct, incomplete right bundle branch block, no acute repolarization abnormalities  Lipid Panel     Component Value Date/Time   CHOL 170 06/22/2012 1020   TRIG 172* 06/22/2012 1020   HDL 32* 06/22/2012 1020   CHOLHDL 5.3 06/22/2012 1020   VLDL 34 06/22/2012 1020   LDLCALC 104* 06/22/2012 1020    BMET    Component Value Date/Time   NA 139 06/22/2012 1020   K 4.9 06/22/2012 1020   CL 107 06/22/2012 1020   CO2 29 06/22/2012 1020   GLUCOSE 94 06/22/2012 1020   BUN 16 06/22/2012 1020   CREATININE 1.04 06/22/2012 1020   CREATININE 0.99 06/11/2010 0400   CALCIUM 9.5 06/22/2012 1020   GFRNONAA >60 06/11/2010 0400   GFRAA  Value: >60        The eGFR has been calculated using the MDRD equation. This calculation has not been validated in all clinical situations. eGFR's persistently <60 mL/min signify possible Chronic Kidney  Disease. 06/11/2010 0400     ASSESSMENT AND PLAN Stroke of unknown cause I don't think that we have clearly establish the cause of his stroke. He has known vascular disease but there was no evidence of carotid lesions. I am concerned that we may be missing a diagnosis of atrial fibrillation in a patient with documented biatrial dilatation. His stroke is in the typical distribution of an embolic event. I think we need to monitor him for possible atrial arrhythmia. We discussed event monitoring versus loop recorder implantation and decided that the latter would be better.This procedure has been fully reviewed with the patient and written informed consent has been obtained.   CAD (coronary artery disease) Currently asymptomatic. Left ventricular systolic function seems to have returned to normal. He still has very obvious Q waves on EKG but all motion was described as normal on his echocardiogram.  Mixed hyperlipidemia The biggest abnormality is the low HDL cholesterol for which we do not have good pharmacological treatment. He is encouraged to lose weight and improve his appetite, especially reducing the intake of starches and simple carbohydrates.  Hypertension A couple of his antihypertensives were stopped at the time of his stroke. Blood pressure borderline high today. We'll have to gradually reinstate these meds. We'll see what his blood pressure is when he comes in for his loop recorder implantation. Would like to restart the beta blocker first.  Tobacco abuse It is more important than ever that he completely and permanently quit smoking. He seems more interested in trying this now. He stopped short making a full commitment.   Patient Instructions  Dr. Sallyanne Kuster wants you to have a Loop Recorder implanted at [facility] which is an outpatient procedure.    Orders Placed This Encounter  Procedures  . EKG 12-Lead  . LOOP RECORDER IMPLANT   Meds ordered this encounter  Medications    . traZODone (DESYREL) 100 MG tablet    Sig: Take 100 mg by mouth at bedtime as needed for sleep.    Holli Humbles, MD, [place] [phone] office [phone] pager

## 2013-08-16 NOTE — Op Note (Signed)
LOOP RECORDER IMPLANT   Procedure report  Procedure performed:  Loop recorder implantation   Reason for procedure:  Cryptogenic stroke  Procedure performed by:  Sanda Klein, MD  Complications:  None  Estimated blood loss:  <5 mL  Medications administered during procedure:  Lidocaine 1% w epi 1/10000 57mL locally Device details:  Medtronic Reveal Linq model number [id number], serial number [id number] Procedure details:  After the risks and benefits of the procedure were discussed the patient provided informed consent. He was brought to the cardiac catheter lab in the fasting state. The patient was prepped and draped in usual sterile fashion. Local anesthesia with 1% lidocaine was administered to an area 2 cm to the left of the sternum in the 4th intercostal space. A diagonal incision was made using the incision tool. The introducer was then used to create a subcutaneous tunnel and carefully deploy the device. Local pressure was held to ensure hemostasis.  The incision was closed with SteriStrips and a sterile dressing was applied.   Sanda Klein, MD, [place] [phone] office [phone] pager 08/16/2013 5:36 PM

## 2013-08-16 NOTE — Interval H&P Note (Signed)
History and Physical Interval Note:  08/16/2013 5:24 PM  Louis Gutierrez  has presented today for surgery, with the diagnosis of cva  The various methods of treatment have been discussed with the patient and family. After consideration of risks, benefits and other options for treatment, the patient has consented to  Procedure(s): LOOP RECORDER IMPLANT (N/A) as a surgical intervention .  The patient's history has been reviewed, patient examined, no change in status, stable for surgery.  I have reviewed the patient's chart and labs.  Questions were answered to the patient's satisfaction.     Louis Gutierrez

## 2013-08-18 ENCOUNTER — Encounter: Payer: Self-pay | Admitting: Cardiovascular Disease

## 2013-08-23 ENCOUNTER — Telehealth: Payer: Self-pay | Admitting: Cardiovascular Disease

## 2013-08-23 NOTE — Telephone Encounter (Signed)
Closed encounter °

## 2013-08-24 ENCOUNTER — Ambulatory Visit (INDEPENDENT_AMBULATORY_CARE_PROVIDER_SITE_OTHER): Payer: Medicare HMO | Admitting: Cardiology

## 2013-08-24 ENCOUNTER — Encounter: Payer: Self-pay | Admitting: Cardiology

## 2013-08-24 VITALS — BP 117/67 | HR 91 | Ht 71.0 in | Wt 198.3 lb

## 2013-08-24 DIAGNOSIS — Z95818 Presence of other cardiac implants and grafts: Secondary | ICD-10-CM | POA: Insufficient documentation

## 2013-08-24 NOTE — Assessment & Plan Note (Signed)
Site with Steri-Strips these were removed site without drainage without erythema without edema. Patient instructed not to shower directly on site for another week but otherwise go about his normal activities. He'll follow with Dr. Loletha Grayer. in 3-4 months unless he has problems prior to that time. Patient's are comfortable with the loop recorder. This was placed secondary to recent CVA.

## 2013-08-24 NOTE — Patient Instructions (Signed)
Your physician recommends that you schedule a follow-up appointment in 3-4 Months with Dr.Croitoru

## 2013-08-24 NOTE — Progress Notes (Signed)
08/24/2013   PCP: Letta Median, MD   Chief Complaint  Patient presents with  . Follow-up    s/p loop    Primary Cardiologist:Dr. Bertrum Sol   HPI: 63 year old male presents today for wound check after insertion of loop recorder 08/16/2013.  He has no complaints today.  No lightheadedness or dizziness no chest pain. Please see previous record for his history and single lead. He has no drainage from the loop site, mild tenderness only. Steri-Strips are still in place.  Allergies  Allergen Reactions  . Penicillins Hives    Current Outpatient Prescriptions  Medication Sig Dispense Refill  . acetaminophen (TYLENOL) 500 MG tablet Take 500 mg by mouth as needed for pain.      Marland Kitchen albuterol (PROVENTIL HFA;VENTOLIN HFA) 108 (90 BASE) MCG/ACT inhaler Inhale 1 puff into the lungs 2 (two) times daily.      Marland Kitchen aspirin EC 81 MG tablet Take 81 mg by mouth daily.      . beclomethasone (QVAR) 80 MCG/ACT inhaler Inhale 1 puff into the lungs 2 (two) times daily.      . clopidogrel (PLAVIX) 75 MG tablet Take 1 tablet (75 mg total) by mouth daily.  30 tablet  1  . isosorbide mononitrate (IMDUR) 30 MG 24 hr tablet Take 1 tablet (30 mg total) by mouth daily.  90 tablet  3  . niacin 500 MG tablet Take 500 mg by mouth daily.      . nitroGLYCERIN (NITROSTAT) 0.4 MG SL tablet Place 0.4 mg under the tongue every 5 (five) minutes as needed for chest pain.      Marland Kitchen PARoxetine (PAXIL) 20 MG tablet Take 40 mg by mouth daily.       . pravastatin (PRAVACHOL) 40 MG tablet Take 1 tablet (40 mg total) by mouth daily.  30 tablet  11  . ramipril (ALTACE) 10 MG capsule Take 1 capsule (10 mg total) by mouth daily.  30 capsule  1  . tiotropium (SPIRIVA) 18 MCG inhalation capsule Place 18 mcg into inhaler and inhale daily as needed. Uses when Asthma is exacerbated      . traZODone (DESYREL) 100 MG tablet Take 100 mg by mouth at bedtime as needed for sleep.       No current facility-administered  medications for this visit.    Past Medical History  Diagnosis Date  . Hypertension 06/10/2010    ECHO at [facility] - see Media tab  . STEMI (ST elevation myocardial infarction) 06/07/2010  . Mixed hyperlipidemia   . OSA (obstructive sleep apnea)   . COPD (chronic obstructive pulmonary disease)   . Tobacco abuse   . CAD (coronary artery disease)   . Obesity   . CVA (cerebral vascular accident) 2015    Past Surgical History  Procedure Laterality Date  . Cardiac catheterization  06/07/2010    50 % ostial narrowing in 1st diagonal from LAD, 2nd diagonal had 40% ostial narrowing; AV groove circumflex was diminutive and occluded proximally; RCA totally occluded in mid segment - stented with 3.5 x 39mm Promus Element DES  - See Media tab  . Loop recorder implant  08/16/13    Dr. Sallyanne Kuster  . Cardiac catheterization  05/2013    [place]    HYI:FOYDXAJ:OI colds or fevers, no weight changes Skin:no rashes or ulcers   Wt Readings from Last 3 Encounters:  08/24/13 198 lb 4.8 oz (89.948 kg)  08/04/13 201 lb 8 oz (91.4  kg)  07/01/12 225 lb (102.059 kg)    PHYSICAL EXAM BP 117/67  Pulse 91  Ht 5\' 11"  (1.803 m)  Wt 198 lb 4.8 oz (89.948 kg)  BMI 27.67 kg/m2 General:Pleasant affect, NAD Skin:Warm and dry, brisk capillary refill, loop site Steri-Strips in place I removed them the site is well-healed well approximated no redness no drainage. Positive mild tenderness on palpation. Heart:S1S2 RRR without murmur, gallup, rub or click Lungs:clear without rales, rhonchi, or wheezes  ASSESSMENT AND PLAN Status post placement of implantable loop recorder  Site with Steri-Strips these were removed site without drainage without erythema without edema. Patient instructed not to shower directly on site for another week but otherwise go about his normal activities. He'll follow with Dr. Sallyanne Kuster. in 3-4 months unless he has problems prior to that time. Patient's are comfortable with the loop recorder. This  was placed secondary to recent CVA.

## 2013-09-15 ENCOUNTER — Ambulatory Visit (INDEPENDENT_AMBULATORY_CARE_PROVIDER_SITE_OTHER): Payer: Medicare HMO | Admitting: *Deleted

## 2013-09-15 DIAGNOSIS — I639 Cerebral infarction, unspecified: Secondary | ICD-10-CM

## 2013-09-15 DIAGNOSIS — I6350 Cerebral infarction due to unspecified occlusion or stenosis of unspecified cerebral artery: Secondary | ICD-10-CM

## 2013-09-16 LAB — MDC_IDC_ENUM_SESS_TYPE_REMOTE
Date Time Interrogation Session: 20150729023651
MDC IDC SET ZONE DETECTION INTERVAL: 2000 ms
Zone Setting Detection Interval: 3000 ms
Zone Setting Detection Interval: 360 ms

## 2013-09-22 ENCOUNTER — Other Ambulatory Visit: Payer: Self-pay | Admitting: *Deleted

## 2013-09-22 MED ORDER — ISOSORBIDE MONONITRATE ER 30 MG PO TB24: 30.0000 mg | ORAL_TABLET | Freq: Every day | ORAL | Status: DC

## 2013-09-22 MED ORDER — CLOPIDOGREL BISULFATE 75 MG PO TABS: 75.0000 mg | ORAL_TABLET | Freq: Every day | ORAL | Status: DC

## 2013-09-22 NOTE — Progress Notes (Signed)
Loop recorder 

## 2013-10-06 ENCOUNTER — Other Ambulatory Visit: Payer: Self-pay | Admitting: *Deleted

## 2013-10-06 MED ORDER — RAMIPRIL 10 MG PO CAPS: 10.0000 mg | ORAL_CAPSULE | Freq: Every day | ORAL | Status: DC

## 2013-10-11 ENCOUNTER — Encounter: Payer: Self-pay | Admitting: Cardiology

## 2013-10-14 ENCOUNTER — Encounter: Payer: Self-pay | Admitting: Cardiovascular Disease

## 2013-10-14 ENCOUNTER — Ambulatory Visit (INDEPENDENT_AMBULATORY_CARE_PROVIDER_SITE_OTHER): Payer: Medicare HMO | Admitting: *Deleted

## 2013-10-14 DIAGNOSIS — I639 Cerebral infarction, unspecified: Secondary | ICD-10-CM

## 2013-10-14 DIAGNOSIS — I6350 Cerebral infarction due to unspecified occlusion or stenosis of unspecified cerebral artery: Secondary | ICD-10-CM

## 2013-10-28 NOTE — Progress Notes (Signed)
Loop recorder 

## 2013-10-31 LAB — MDC_IDC_ENUM_SESS_TYPE_REMOTE

## 2013-11-10 ENCOUNTER — Other Ambulatory Visit: Payer: Self-pay | Admitting: *Deleted

## 2013-11-10 MED ORDER — RAMIPRIL 10 MG PO CAPS: 10.0000 mg | ORAL_CAPSULE | Freq: Every day | ORAL | Status: DC

## 2013-11-10 NOTE — Telephone Encounter (Signed)
Rx was sent to pharmacy electronically. 

## 2013-11-14 ENCOUNTER — Ambulatory Visit (INDEPENDENT_AMBULATORY_CARE_PROVIDER_SITE_OTHER): Payer: Medicare HMO | Admitting: *Deleted

## 2013-11-14 DIAGNOSIS — I639 Cerebral infarction, unspecified: Secondary | ICD-10-CM

## 2013-11-14 LAB — MDC_IDC_ENUM_SESS_TYPE_REMOTE

## 2013-11-21 NOTE — Progress Notes (Signed)
Loop recorder 

## 2013-12-05 ENCOUNTER — Emergency Department: Payer: Self-pay | Admitting: Emergency Medicine

## 2013-12-05 ENCOUNTER — Telehealth: Payer: Self-pay | Admitting: Cardiovascular Disease

## 2013-12-05 LAB — COMPREHENSIVE METABOLIC PANEL
ALBUMIN: 3.6 g/dL (ref 3.4–5.0)
ALK PHOS: 165 U/L — AB
ALT: 30 U/L
AST: 22 U/L (ref 15–37)
Anion Gap: 7 (ref 7–16)
BUN: 16 mg/dL (ref 7–18)
Bilirubin,Total: 0.4 mg/dL (ref 0.2–1.0)
CALCIUM: 8.6 mg/dL (ref 8.5–10.1)
Chloride: 104 mmol/L (ref 98–107)
Co2: 28 mmol/L (ref 21–32)
Creatinine: 1.06 mg/dL (ref 0.60–1.30)
EGFR (Non-African Amer.): >60
Glucose: 103 mg/dL — ABNORMAL HIGH (ref 65–99)
OSMOLALITY: 279 (ref 275–301)
Potassium: 4 mmol/L (ref 3.5–5.1)
Sodium: 139 mmol/L (ref 136–145)
Total Protein: 7.5 g/dL (ref 6.4–8.2)

## 2013-12-05 LAB — URINALYSIS, COMPLETE
Bacteria: NONE SEEN
Bilirubin,UR: NEGATIVE
Glucose,UR: NEGATIVE mg/dL (ref 0–75)
Ketone: NEGATIVE
Leukocyte Esterase: NEGATIVE
Nitrite: NEGATIVE
PROTEIN: NEGATIVE
Ph: 6 (ref 4.5–8.0)
RBC,UR: 5 /HPF (ref 0–5)
Specific Gravity: 1.006 (ref 1.003–1.030)
Squamous Epithelial: NONE SEEN
WBC UR: <1 /HPF (ref 0–5)

## 2013-12-05 LAB — CBC WITH DIFFERENTIAL/PLATELET
BASOS PCT: 0.6 %
Basophil #: 0 10*3/uL (ref 0.0–0.1)
EOS ABS: 0.2 10*3/uL (ref 0.0–0.7)
Eosinophil %: 2 %
HCT: 40.6 % (ref 40.0–52.0)
HGB: 13.2 g/dL (ref 13.0–18.0)
LYMPHS ABS: 1.1 10*3/uL (ref 1.0–3.6)
Lymphocyte %: 13.9 %
MCH: 28.7 pg (ref 26.0–34.0)
MCHC: 32.5 g/dL (ref 32.0–36.0)
MCV: 88 fL (ref 80–100)
MONOS PCT: 8 %
Monocyte #: 0.7 x10 3/mm (ref 0.2–1.0)
Neutrophil #: 6.1 10*3/uL (ref 1.4–6.5)
Neutrophil %: 75.5 %
Platelet: 259 10*3/uL (ref 150–440)
RBC: 4.6 10*6/uL (ref 4.40–5.90)
RDW: 15 % — ABNORMAL HIGH (ref 11.5–14.5)
WBC: 8.1 10*3/uL (ref 3.8–10.6)

## 2013-12-05 LAB — PROTIME-INR
INR: 1.1
PROTHROMBIN TIME: 13.9 s (ref 11.5–14.7)

## 2013-12-05 LAB — APTT: Activated PTT: 27 secs (ref 23.6–35.9)

## 2013-12-05 LAB — TROPONIN I: Troponin-I: <0.02 ng/mL

## 2013-12-05 NOTE — Telephone Encounter (Signed)
Spoke with pt, he reports while at work today he bent over and when he stood up he lost all bladder control. He did not loose consciousness but states he was lightheaded. This has occurred one other time prior to his last stroke. He checked his bp while on the phone with me and got 140/105. He c/o headache over the right eye and weakness in the left leg. He will have his wife take him the the ER.

## 2013-12-05 NOTE — Telephone Encounter (Signed)
Please call, having some episode,thinks he might need to be seen.

## 2013-12-14 ENCOUNTER — Ambulatory Visit (INDEPENDENT_AMBULATORY_CARE_PROVIDER_SITE_OTHER): Payer: Medicare HMO | Admitting: *Deleted

## 2013-12-14 DIAGNOSIS — I639 Cerebral infarction, unspecified: Secondary | ICD-10-CM

## 2013-12-20 ENCOUNTER — Encounter: Payer: Self-pay | Admitting: Cardiovascular Disease

## 2013-12-20 ENCOUNTER — Ambulatory Visit (INDEPENDENT_AMBULATORY_CARE_PROVIDER_SITE_OTHER): Payer: Medicare HMO | Admitting: Cardiovascular Disease

## 2013-12-20 VITALS — BP 108/80 | HR 84 | Resp 16 | Ht 71.0 in | Wt 201.3 lb

## 2013-12-20 DIAGNOSIS — I10 Essential (primary) hypertension: Secondary | ICD-10-CM

## 2013-12-20 DIAGNOSIS — F17210 Nicotine dependence, cigarettes, uncomplicated: Secondary | ICD-10-CM | POA: Diagnosis not present

## 2013-12-20 DIAGNOSIS — Z720 Tobacco use: Secondary | ICD-10-CM

## 2013-12-20 DIAGNOSIS — Z95818 Presence of other cardiac implants and grafts: Secondary | ICD-10-CM

## 2013-12-20 DIAGNOSIS — I639 Cerebral infarction, unspecified: Secondary | ICD-10-CM | POA: Diagnosis not present

## 2013-12-20 DIAGNOSIS — J449 Chronic obstructive pulmonary disease, unspecified: Secondary | ICD-10-CM

## 2013-12-20 DIAGNOSIS — I2510 Atherosclerotic heart disease of native coronary artery without angina pectoris: Secondary | ICD-10-CM

## 2013-12-20 DIAGNOSIS — E782 Mixed hyperlipidemia: Secondary | ICD-10-CM

## 2013-12-20 NOTE — Progress Notes (Signed)
Patient ID: Louis Gutierrez, male   DOB: 25-Sep-1950, 63 y.o.   MRN: [id number]      Reason for office visit CAD, cryptogenic stroke, loop recorder, tobacco abuse  Gene has no complaints today. He has not had chest pain or new neurological events. He has chronic mild shortness of breath and a chronic cough. He continues to smoke. Loop recorder interrogation shows no evidence of arrhythmia.  He presented with ST segment elevation myocardial infarction in May of 2012. At that time had an inferior wall infarction, manifesting as unstable angina pectoris and eventually a syncopal episode. The right coronary artery was totally occluded and revascularization was difficult but eventually he received a 3.5 x 24 mm Promus drug-eluting stent to the mid RCA. Also noted was 50% ostial stenosis of the first diagonal artery and 40% ostial stenosis of the second diagonal artery branches of the LAD, as well as total occlusion of a small left circumflex system. There was evidence of inferolateral hypokinesis and ejection fraction of 45-50% at that time. He presented with chest pain to [facility] earlier this year when he had a repeat coronary angiogram that was reportedly normal. I don't have that report. In May 2015 he presented with right hemiparesis and expressive aphasia with an infarction in the territory of the left middle cerebral artery. An etiology was not identified. He underwent loop recorder implantation in August and so far the device has not recorded any arrhythmia.  His most recent echo performed in March 2015 showed left ventricular ejection fraction of 50-55 percent with abnormal relaxation and no regional wall motion abnormalities. Both atria were described as normal in size and there were no serious valvular abnormalities.  His most recent lipid profile was remarkable for very low HDL cholesterol of 28, but total cholesterol was 112 and LDL was 56. Triglycerides 138. Hemoglobin  A1c 6.1%.(March 2015). Continues to smoke although he reports trying to cut down. He is raising 2 of his grandchildren, this puts a lot of psychological stress on him.  Allergies  Allergen Reactions  . Penicillins Hives    Current Outpatient Prescriptions  Medication Sig Dispense Refill  . acetaminophen (TYLENOL) 500 MG tablet Take 500 mg by mouth as needed for pain.    Marland Kitchen albuterol (PROVENTIL HFA;VENTOLIN HFA) 108 (90 BASE) MCG/ACT inhaler Inhale 1 puff into the lungs 2 (two) times daily.    Marland Kitchen aspirin EC 81 MG tablet Take 81 mg by mouth daily.    . beclomethasone (QVAR) 80 MCG/ACT inhaler Inhale 1 puff into the lungs 2 (two) times daily.    . clopidogrel (PLAVIX) 75 MG tablet Take 1 tablet (75 mg total) by mouth daily. 30 tablet 8  . isosorbide mononitrate (IMDUR) 30 MG 24 hr tablet Take 1 tablet (30 mg total) by mouth daily. 90 tablet 3  . nitroGLYCERIN (NITROSTAT) 0.4 MG SL tablet Place 0.4 mg under the tongue every 5 (five) minutes as needed for chest pain.    Marland Kitchen PARoxetine (PAXIL) 40 MG tablet Take 40 mg by mouth daily.    . pravastatin (PRAVACHOL) 40 MG tablet Take 1 tablet (40 mg total) by mouth daily. 30 tablet 11  . ramipril (ALTACE) 10 MG capsule Take 1 capsule (10 mg total) by mouth daily. 30 capsule 2  . traZODone (DESYREL) 100 MG tablet Take 100 mg by mouth at bedtime as needed for sleep.     No current facility-administered medications for this visit.    Past Medical History  Diagnosis  Date  . Hypertension 06/10/2010    ECHO at [facility] - see Media tab  . STEMI (ST elevation myocardial infarction) 06/07/2010  . Mixed hyperlipidemia   . OSA (obstructive sleep apnea)   . COPD (chronic obstructive pulmonary disease)   . Tobacco abuse   . CAD (coronary artery disease)   . Obesity   . CVA (cerebral vascular accident) 2015    Past Surgical History  Procedure Laterality Date  . Cardiac catheterization  06/07/2010    50 % ostial narrowing in 1st diagonal from LAD, 2nd diagonal had  40% ostial narrowing; AV groove circumflex was diminutive and occluded proximally; RCA totally occluded in mid segment - stented with 3.5 x 60m Promus Element DES  - See Media tab  . Loop recorder implant  08/16/13    Dr. CSallyanne Kuster . Cardiac catheterization  05/2013    [place]    Family History  Problem Relation Age of Onset  . Cancer Mother   . Stroke Maternal Grandmother   . Diabetes Brother   . Coronary artery disease Brother     History   Social History  . Marital Status: Married    Spouse Name: N/A    Number of Children: N/A  . Years of Education: N/A   Occupational History  . Not on file.   Social History Main Topics  . Smoking status: Current Every Day Smoker -- 1.00 packs/day for 48 years    Types: Cigarettes  . Smokeless tobacco: Not on file  . Alcohol Use: No  . Drug Use: No  . Sexual Activity: Not on file   Other Topics Concern  . Not on file   Social History Narrative    Review of systems: The patient specifically denies any chest pain at rest or with exertion, dyspnea at rest or with exertion, orthopnea, paroxysmal nocturnal dyspnea, syncope, palpitations, focal neurological deficits, intermittent claudication, lower extremity edema, unexplained weight gain, cough, hemoptysis or wheezing.  The patient also denies abdominal pain, nausea, vomiting, dysphagia, diarrhea, constipation, polyuria, polydipsia, dysuria, hematuria, frequency, urgency, abnormal bleeding or bruising, fever, chills, unexpected weight changes, mood swings, change in skin or hair texture, change in voice quality, auditory or visual problems, allergic reactions or rashes, new musculoskeletal complaints other than usual "aches and pains".  PHYSICAL EXAM BP 108/80 mmHg  Pulse 84  Resp 16  Ht 5' 11"  (1.803 m)  Wt 201 lb 4.8 oz (91.309 kg)  BMI 28.09 kg/m2 General: Alert, oriented x3, no distress Head: no evidence of trauma, PERRL, EOMI, no exophtalmos or lid lag, no myxedema, no  xanthelasma; normal ears, nose and oropharynx Neck: normal jugular venous pulsations and no hepatojugular reflux; brisk carotid pulses without delay and no carotid bruits Chest: clear to auscultation, no signs of consolidation by percussion or palpation, normal fremitus, symmetrical and full respiratory excursions Cardiovascular: normal position and quality of the apical impulse, regular rhythm, normal first and second heart sounds, no murmurs, rubs or gallops Abdomen: no tenderness or distention, no masses by palpation, no abnormal pulsatility or arterial bruits, normal bowel sounds, no hepatosplenomegaly Extremities: no clubbing, cyanosis or edema; 2+ radial, ulnar and brachial pulses bilaterally; 2+ right femoral, posterior tibial and dorsalis pedis pulses; 2+ left femoral, posterior tibial and dorsalis pedis pulses; no subclavian or femoral bruits Neurological: grossly nonfocal, his grip appears to be symmetrical to me, he is right-hand-dominant it is possible that his right grip is stronger before.   EKG: Normal sinus rhythm, old inferoposterior infarct, incomplete right bundle  branch block, no acute repolarization abnormalities   Lipid Panel   His most recent lipid profile was remarkable for very low HDL cholesterol of 28, but total cholesterol was 112 and LDL was 56. Triglycerides 138. Hemoglobin A1c 6.1%.(March 2015). Continues to smoke although he reports trying to cut down. He is raising 2 of his grandchildren, this puts a lot of psychological stress on him.    Component Value Date/Time   CHOL 170 06/22/2012 1020   TRIG 172* 06/22/2012 1020   HDL 32* 06/22/2012 1020   CHOLHDL 5.3 06/22/2012 1020   VLDL 34 06/22/2012 1020   LDLCALC 104* 06/22/2012 1020    BMET    Component Value Date/Time   NA 139 06/22/2012 1020   K 4.9 06/22/2012 1020   CL 107 06/22/2012 1020   CO2 29 06/22/2012 1020   GLUCOSE 94 06/22/2012 1020   BUN 16 06/22/2012 1020   CREATININE 1.04 06/22/2012 1020     CREATININE 0.99 06/11/2010 0400   CALCIUM 9.5 06/22/2012 1020   GFRNONAA >60 06/11/2010 0400   GFRAA  06/11/2010 0400    >60        The eGFR has been calculated using the MDRD equation. This calculation has not been validated in all clinical situations. eGFR's persistently <60 mL/min signify possible Chronic Kidney Disease.     ASSESSMENT AND PLAN  Stroke of unknown cause Loop recorder has not identified arrhythmia to this point. Continue antiplatelet therapy  CAD (coronary artery disease) Currently asymptomatic. Left ventricular systolic function seems to have returned to normal. He still has very obvious Q waves on EKG but wall motion was described as normal on his echocardiogram.  Unfortunately the records from [facility] did not include his coronary angiogram report. He was told that the findings were "normal".  Mixed hyperlipidemia The biggest abnormality is the low HDL cholesterol for which we do not have good pharmacological treatment. He is encouraged to lose weight and improve his appetite, especially reducing the intake of starches and simple carbohydrates.  Hypertension Blood pressure control is good.  Tobacco abuse It is more important than ever that he completely and permanently quit smoking. He does not smoke for 8-9 hours at a time when he is at work. This suggests he does not have a true nicotine addiction, but just a behavioral 1. Again, he stopped short making a full commitment.  Meds ordered this encounter  Medications  . PARoxetine (PAXIL) 40 MG tablet    Sig: Take 40 mg by mouth daily.    Holli Humbles, MD, [place] [phone] office [phone] pager

## 2013-12-20 NOTE — Patient Instructions (Signed)
Your physician recommends that you schedule a follow-up appointment in: 12 months with Dr.Croitoru  

## 2013-12-21 LAB — MDC_IDC_ENUM_SESS_TYPE_INCLINIC
MDC IDC SESS DTM: 20151201150521
Zone Setting Detection Interval: 2000 ms
Zone Setting Detection Interval: 3000 ms
Zone Setting Detection Interval: 360 ms

## 2013-12-23 NOTE — Progress Notes (Signed)
Loop recorder 

## 2013-12-27 ENCOUNTER — Encounter: Payer: Self-pay | Admitting: Cardiovascular Disease

## 2014-01-03 LAB — MDC_IDC_ENUM_SESS_TYPE_REMOTE

## 2014-01-06 ENCOUNTER — Encounter: Payer: Self-pay | Admitting: Cardiovascular Disease

## 2014-01-12 ENCOUNTER — Ambulatory Visit (INDEPENDENT_AMBULATORY_CARE_PROVIDER_SITE_OTHER): Payer: Medicare HMO | Admitting: *Deleted

## 2014-01-12 DIAGNOSIS — I639 Cerebral infarction, unspecified: Secondary | ICD-10-CM

## 2014-01-12 LAB — MDC_IDC_ENUM_SESS_TYPE_REMOTE

## 2014-01-18 NOTE — Progress Notes (Signed)
Loop recorder 

## 2014-01-24 ENCOUNTER — Encounter: Payer: Self-pay | Admitting: Cardiovascular Disease

## 2014-02-13 ENCOUNTER — Ambulatory Visit (INDEPENDENT_AMBULATORY_CARE_PROVIDER_SITE_OTHER): Payer: Medicare HMO | Admitting: *Deleted

## 2014-02-13 DIAGNOSIS — I639 Cerebral infarction, unspecified: Secondary | ICD-10-CM

## 2014-02-16 NOTE — Progress Notes (Signed)
Loop recorder 

## 2014-02-21 ENCOUNTER — Encounter: Payer: Self-pay | Admitting: Cardiovascular Disease

## 2014-02-23 ENCOUNTER — Other Ambulatory Visit: Payer: Self-pay

## 2014-02-23 MED ORDER — RAMIPRIL 10 MG PO CAPS: 10.0000 mg | ORAL_CAPSULE | Freq: Every day | ORAL | Status: DC

## 2014-02-23 NOTE — Telephone Encounter (Signed)
Rx sent to pharmacy   

## 2014-03-03 LAB — MDC_IDC_ENUM_SESS_TYPE_REMOTE

## 2014-03-15 ENCOUNTER — Ambulatory Visit (INDEPENDENT_AMBULATORY_CARE_PROVIDER_SITE_OTHER): Payer: Medicare HMO | Admitting: *Deleted

## 2014-03-15 DIAGNOSIS — I639 Cerebral infarction, unspecified: Secondary | ICD-10-CM

## 2014-03-17 NOTE — Progress Notes (Signed)
Loop recorder 

## 2014-03-29 LAB — MDC_IDC_ENUM_SESS_TYPE_REMOTE

## 2014-04-10 ENCOUNTER — Encounter: Payer: Self-pay | Admitting: Cardiovascular Disease

## 2014-04-14 ENCOUNTER — Ambulatory Visit (INDEPENDENT_AMBULATORY_CARE_PROVIDER_SITE_OTHER): Payer: Medicare HMO | Admitting: *Deleted

## 2014-04-14 DIAGNOSIS — I639 Cerebral infarction, unspecified: Secondary | ICD-10-CM

## 2014-04-14 NOTE — Progress Notes (Signed)
Loop recorder 

## 2014-04-18 ENCOUNTER — Other Ambulatory Visit: Payer: Self-pay

## 2014-04-18 MED ORDER — NITROGLYCERIN 0.4 MG SL SUBL: 0.4000 mg | SUBLINGUAL_TABLET | SUBLINGUAL | Status: DC | PRN

## 2014-04-18 NOTE — Telephone Encounter (Signed)
Rx(s) sent to pharmacy electronically.  

## 2014-04-20 DIAGNOSIS — M170 Bilateral primary osteoarthritis of knee: Secondary | ICD-10-CM | POA: Insufficient documentation

## 2014-04-20 DIAGNOSIS — M11269 Other chondrocalcinosis, unspecified knee: Secondary | ICD-10-CM | POA: Insufficient documentation

## 2014-05-10 LAB — MDC_IDC_ENUM_SESS_TYPE_REMOTE

## 2014-05-13 NOTE — Discharge Summary (Signed)
Dates of Admission and Diagnosis:  Date of Admission 21-May-2013   Date of Discharge 23-May-2013   Admitting Diagnosis CVA   Final Diagnosis acute CVA Hx of CAD Htn Hyperlipidemia Smoking bradycardia- stopped metoprolol pneumonia    Chief Complaint/History of Present Illness a 64 year old pleasant white male with past medical history of hypertension, hyperlipidemia, coronary artery disease status post stent placement, who presented to the Emergency Department with complaints of the confusion that started since this evening. The patient went to his friend's to work on a car, came back, and his wife noticed him to have left facial droop. The patient was somewhat confused. As the patient woke up early in the morning, wife allowed him to go to sleep, woke up about 2 hours later, continued to be confused with speaking illogically. Was noted to have right-sided weakness. Concerning this, EMS was called and was brought to the Emergency Department. Workup in the Emergency Department was a CT head without contrast shows 2.3 x 2.4-cm hypodensity in the left caudate and putamen compatible with acute infarct, mild surrounding mass effect without midline shift. The patient states has a mild headache, has some blurred vision. Had some difficulty swallowing; however, it is improving currently. The patient states the weakness on the right side is improving from the time of presentation.   Allergies:  PCN: Hives  Thyroid:  03-May-15 05:26   Thyroid Stimulating Hormone 1.08 (0.45-4.50 (International Unit)  ----------------------- Pregnant patients have  different reference  ranges for TSH:  - - - - - - - - - -  Pregnant, first trimetser:  0.36 - 2.50 uIU/mL)  Cardiology:  03-May-15 13:30   Echo Doppler REASON FOR EXAM:     COMMENTS:     PROCEDURE: [place] - ECHO DOPPLER COMPLETE(TRANSTHOR)  - May 22 2013  1:30PM   RESULT: Echocardiogram Report  Patient Name:   Louis Gutierrez Date of Exam:  05/22/2013 Medical Rec #:  [id number]       Custom1: Date of Birth:  [birth year]   Height:       71.0 in Patient Age:    65 years     Weight:       196.0 lb Patient Gender: M            BSA:          2.09 m??  Indications: CVA Sonographer:    Janalee Dane RCS Referring Phys: Monica Becton  Summary:  1. Left ventricular ejection fraction, by visual estimation, is 55 to  60%.No thrombii seen.  2. Elevated mean left atrial pressure.  3. Impaired relaxation pattern of LV diastolic filling.  4. Moderately dilated left atrium.  5. Normal right ventricular size and systolic function.  6. Moderately dilated right atrium.  7. Mild tricuspid regurgitation. 2D AND M-MODE MEASUREMENTS (normal ranges within parentheses): Left Ventricle:          Normal IVSd (2D):      1.15 cm (0.7-1.1) LVPWd (2D):     0.69 cm (0.7-1.1) Aorta/LA:                  Normal LVIDd (2D):     5.13 cm (3.4-5.7) Aortic Root (2D): 3.40 cm (2.4-3.7) LVIDs (2D):     3.64 cm           Left Atrium (2D): 3.80 cm (1.9-4.0) LV FS (2D):     29.0 %   (>25%) LV EF (2D):  55.5 %   (>50%)  Right Ventricle:                                   RVd (2D): LV DIASTOLIC FUNCTION: MV Peak E: 0.85 m/s E/e' Ratio: 6.20 MV Peak A: 0.48 m/s Decel Time: 246 msec E/A Ratio: 1.77 SPECTRAL DOPPLER ANALYSIS (where applicable): Mitral Valve: MV P1/2 Time: 71.34 msec MV Area, PHT: 3.08 cm?? PHYSICIAN INTERPRETATION: Left Ventricle: The left ventricular internal cavity size was normal. LV  posterior wall thickness was normal. Left ventricular ejection fraction,  by visual estimation, is 55 to 60%. Spectral Doppler shows impaired  relaxation pattern of LV diastolic filling. Elevated mean left atrial  pressure. Right Ventricle: Normal right ventricular size, wall thickness, and  systolic function. Left Atrium: The left atrium is moderately dilated. Right Atrium: The right atrium is moderately dilated. Mitral  Valve: Trace mitral valve regurgitation is seen. Tricuspid Valve: Mild tricuspid regurgitation is visualized. Aortic Valve: The aortic valve is normal. Pulmonic Valve: Trace pulmonic valve regurgitation. Towns MD Electronically signed by [id number] Neoma Laming MD Signature Date/Time: 05/22/2013/2:58:39 PM  *** Final ***  IMPRESSION: .    Verified By: Emmit Pomfret. Humphrey Rolls, M.D., MD  Routine Chem:  02-May-15 21:30   Glucose, Serum  100  BUN 17  Creatinine (comp) 0.76  Sodium, Serum 144  Potassium, Serum 4.6  Chloride, Serum  108  CO2, Serum 27  Calcium (Total), Serum 9.1  Anion Gap 9  Osmolality (calc) 288  eGFR (African American) >60  eGFR (Non-African American) >60 (eGFR values <22m/min/1.73 m2 may be an indication of chronic kidney disease (CKD). Calculated eGFR is useful in patients with stable renal function. The eGFR calculation will not be reliable in acutely ill patients when serum creatinine is changing rapidly. It is not useful in  patients on dialysis. The eGFR calculation may not be applicable to patients at the low and high extremes of body sizes, pregnant women, and vegetarians.)  Cardiac:  02-May-15 21:30   Troponin I < 0.02 (0.00-0.05 0.05 ng/mL or less: NEGATIVE  Repeat testing in 3-6 hrs  if clinically indicated. >0.05 ng/mL: POTENTIAL  MYOCARDIAL INJURY. Repeat  testing in 3-6 hrs if  clinically indicated. NOTE: An increase or decrease  of 30% or more on serial  testing suggests a  clinically important change)  Routine Hem:  02-May-15 21:30   WBC (CBC) 7.3  RBC (CBC)  4.24  Hemoglobin (CBC)  12.1  Hematocrit (CBC)  37.9  Platelet Count (CBC) 419 (Result(s) reported on 21 May 2013 at 10:06PM.)  MCV 89  MCH 28.6  MCHC 32.0  RDW  15.8   PERTINENT RADIOLOGY STUDIES: [place]    03-May-15 12:05, [place] Bilateral  [place] Doppler Bilateral   REASON FOR EXAM:    CVA  COMMENTS:       PROCEDURE: [place] - [place] DOPPL[place]  BILATERAL  - May 22 2013 12:05PM     CLINICAL DATA:  CVA, history of hypertension, CAD, post MI, smoking,  hyperlipidemia    EXAM:  BILATERAL CAROTID DUPLEX ULTRASOUND    TECHNIQUE:  GPearline Cablesscale imaging, color Doppler and duplex ultrasound were  performed of bilateral carotid and vertebral arteries in the neck.  COMPARISON:  CT HEAD W/O CM dated 05/21/2013    FINDINGS:  Criteria: Quantification of carotid stenosis is based on velocity  parameters that correlate the residual internal carotid diameter  with NASCET-based stenosis  levels, using the diameter of the distal  internal carotid lumen as the denominator for stenosis measurement.    The following velocity measurements were obtained:    RIGHT    ICA:  101.2/32.5 cm/sec    CCA:  73/71.0 cm/sec  SYSTOLIC ICA/CCA RATIO:  6.26    DIASTOLIC ICA/CCA RATIO:  9.48    ECA:  88.5 cm/sec    LEFT    ICA:  103.5/37.3 cm/sec    CCA:  546.2/70.3 cm/sec    SYSTOLIC ICA/CCA RATIO:  5.00    DIASTOLIC ICA/CCA RATIO:  9.38  ECA:  89.3 cm/sec    RIGHT CAROTID ARTERY: There is a minimal amount of eccentric  echogenic plaque within the mid aspect of the right common carotid  artery (images 5 and 6). There is a minimal amount of eccentric  mixed echogenic plaque within the right carotid bulb (images 10 and  11), extending to involve the origin and proximal aspect of the  right internal carotid artery (image 16), not resulting in elevated  peak systolic velocities within the interrogated course of the right  internal carotid artery to suggest a hemodynamically significant  stenosis.    RIGHT VERTEBRAL ARTERY:  Antegrade flow    LEFT CAROTID ARTERY: There is a minimal amount of eccentric mixed  echogenic plaque within the left carotid bulb (images 33 and 34),  not resulting in elevated peak systolic velocities within the  interrogated course of the left internal carotid artery to suggest a  hemodynamically significant  stenosis.    LEFT VERTEBRAL ARTERY:  Antegrade flow     IMPRESSION:  Minimal amount of bilateral atherosclerotic plaque, right  subjectively greater than left, not resulting in a hemodynamically  significant stenosis.      Electronically Signed    By: Sandi Mariscal M.D.    On: 05/22/2013 13:36         Verified By: Aileen Fass, M.D.,  MRI:    03-May-15 13:14, MRI Brain Without Contrast  MRI Brain Without Contrast   REASON FOR EXAM:    CVA  COMMENTS:       PROCEDURE: MR  - MR BRAIN WO CONTRAST  - May 22 2013  1:14PM     CLINICAL DATA:  Right sided weakness, confusion, and blurred vision.    EXAM:  MRI HEAD WITHOUT CONTRAST    TECHNIQUE:  Multiplanar, multiecho pulse sequences of the brain and surrounding  structures were obtained without intravenous contrast.    COMPARISON:  US CAROTID DUPLEX BILAT dated 05/22/2013; CT HEAD W/O CM  dated 05/21/2013; CT HEAD W/O CM dated 03/15/2006; DG CHEST 1V PORT  dated 05/21/2013    FINDINGS:  There is a moderate size area of restricted diffusion affecting the  left putamen, caudate, and adjacent white matter consistent with the  observed area of cytotoxic edema on recent CT. Smaller areas of  acute infarction can be seen inthe left parietal subcortical white  matter and left parietal cortex near the vertex (image 21 series  100). No right sided infarction. No brainstem or cerebellar  infarction. No evidence for hemorrhagic transformation.    No mass lesion, hydrocephalus, or extra-axial fluid.    Mild cerebral and cerebellar atrophy. Mild subcortical and  periventricular T2 and FLAIR hyperintensities, likely chronic  microvascular ischemic change. Pituitary, pineal, and cerebellar  tonsils unremarkable. No upper cervical lesions. Previous cervical  fusion with suspected adjacent segment disease at C3-4 causing  spinal stenosis. Consider dedicated cervical spine MR.  Flow voids are maintained throughout the carotid, basilar,  and  vertebral arteries. There are no areas of chronic hemorrhage.  Visualized calvarium, skull base, and upper cervical osseous  structures unremarkable. Scalp and extracranial soft tissues,  orbits, sinuses, and mastoids show no acute process.     IMPRESSION:  Moderate-sized area of restricted diffusion consistent with an acute  left MCA lenticulostriate infarction. No associated hemorrhage.  Smaller areas of left parietal cortical and subcortical infarction,  also without hemorrhage.    Mild atrophy and small vessel disease.    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      Electronically Signed    By: Rolla Flatten M.D.    On: 05/22/2013 13:56         Verified By: Staci Righter, M.D.,   Pertinent Past History:  Pertinent Past History 1. Coronary artery disease status post stent placement.  2. Hypertension.  3. Varicose veins. 4. COPD.  5. Osteoarthritis.  6. Obstructive sleep apnea on CPAP.  7. Depression. 8. Insomnia.   Hospital Course:  Hospital Course 1. acute cva- with right sided weakness - appreciated pt, ot and speach eval - not taking aspirin as he should at home - asa and plavix for stroke prevention - echo no clot - tele- sinus bradycardia - carotid US negative -mri confirms MCA stroke. 2. Pneumonia - rocephin and zithromax-improved. 3. acute encephalopathy likley due to stroke. - improved 4. Bradycardia - held metoprolol 5. HTN - BP lower side, holding metoprolol and hctz 6. hyperlipidemia - lipid panel acceptable, contniue statin 7. tobacco abuse - smoking cessation councelling done 3 minutes by me   Condition on Discharge Stable   Code Status:  Code Status Full Code   DISCHARGE INSTRUCTIONS HOME MEDS:  Medication Reconciliation: Patient's Home Medications at Discharge:     Medication Instructions  combivent respimat cfc free 20 mcg-100 mcg/inh inhalation aerosol  2 puff(s) inhaled 3  times a day, As Needed- for Shortness of Breath    qvar 80 mcg/inh inhalation aerosol  1 puff(s) inhaled 2 times a day   paroxetine 40 mg oral tablet  1 tab(s) orally once a day   aspirin enteric coated 81 mg oral delayed release tablet  1 tab(s) orally once a day   isosorbide mononitrate 30 mg oral tablet, extended release  1 tab(s) orally once a day   clopidogrel 75 mg oral tablet  1 tab(s) orally once a day   pravastatin 40 mg oral tablet  1 tab(s) orally once a day (at bedtime)   trazodone 100 mg oral tablet  1 tab(s) orally once a day   ramipril 10 mg oral capsule  1 cap(s) orally once a day   ceftin 500 mg oral tablet  1 tab(s) orally 2 times a day x 4 days    STOP TAKING THE FOLLOWING MEDICATION(S):    metoprolol tartrate 25 mg oral tablet: 1 tab(s) orally 2 times a day. Comments:  hydrochlorothiazide 25 mg oral tablet: 1 tab(s) orally once a day  Physician's Instructions:  Diet Low Sodium  Low Fat, Low Cholesterol   Activity Limitations As tolerated   Return to Work Not Applicable   Time frame for Follow Up Appointment 1-2 weeks   Other Comments Follwo with PMD in 1-2 weeks. Out pt PT clinic and Modified barium swallow eval.   Electronic Signatures: Vaughan Basta (MD)  (Signed 10-May-15 18:14)  Authored: ADMISSION DATE AND DIAGNOSIS, CHIEF COMPLAINT/HPI, Allergies,  PERTINENT LABS, PERTINENT RADIOLOGY STUDIES, [place] MEDS, PATIENT INSTRUCTIONS   Last Updated: 10-May-15 18:14 by Vaughan Basta (MD)

## 2014-05-13 NOTE — Consult Note (Signed)
PATIENT NAME:  Louis Gutierrez, WEIDINGER MR#:  [id number] DATE OF BIRTH:  March 05, 1950  CARDIOLOGY CONSULTATION  DATE OF CONSULTATION:  04/04/2013  CONSULTING PHYSICIAN:  Dionisio David, MD  INDICATIONS: Unstable angina.  HISTORY OF PRESENT ILLNESS: This is a 64 year old white male with a past medical history of coronary artery disease status post MI in 2012, history of having PCI and stenting during MI by Dr. Claiborne Billings at [facility] who presented today with another episode of chest pain described as an elephant sitting on his chest associated with some shortness of breath and diaphoresis. Right now, he is not having any chest pain. He underwent a CT scan which showed no evidence of any dissection or PE, and I was asked to evaluate the patient for cardiology. Right now, he is denying any chest pain.  PAST MEDICAL HISTORY: History of as mentioned PCI and stenting by Dr. Claiborne Billings, history of varicose veins, history of C-spine surgery, COPD, arthritis, obstructive sleep apnea on CPAP.   ALLERGIES: PENICILLIN.  MEDICATIONS: Aspirin 81 mg, clopidogrel 75 mg, isosorbide 30 mg, pravastatin 40 mg,.   FAMILY HISTORY: Positive for cancer and hypertension.  SOCIAL HISTORY: Smokes 1 and 1/2 packs per day.  PHYSICAL EXAMINATION:  GENERAL: Alert and oriented x3. In no acute distress. VITAL SIGNS: Temperature is 97.7, pulse 64, respirations 20. Blood pressure is 126/75. Saturation is 95. HEENT: No JVD. LUNGS: Clear. HEART: Regular rate and rhythm, normal S1, S2. No audible murmur. ABDOMEN: Soft, nontender. Positive bowel sounds.  EXTREMITIES: No pedal edema.  NEUROLOGIC: Appears to be intact.  EKG: Sinus bradycardia 55 beats per minute, incomplete right bundle branch block, old inferior wall MI with Q-wave in leads II, III, and aVF and nonspecific ST-T changes.  LABORATORIES: BUN 13, creatinine 1.07. CBC unremarkable. Troponin is negative.   CT angio was negative.  ASSESSMENT AND PLAN: The patient appears to be  having unstable angina with prior history of coronary artery disease and percutaneous coronary intervention and stenting with multiple risk factors of coronary artery disease along with symptomatology. Advise cardiac catheterization and continue rest of the medications including heparin. Will schedule cardiac catheterization tomorrow.  Thank you very much for the referral.   ____________________________ Dionisio David, MD sak:np D: 04/04/2013 17:48:52 ET T: 04/04/2013 18:21:41 ET JOB#: [id number]  cc: Dionisio David, MD, <Dictator> Dionisio David MD ELECTRONICALLY SIGNED 05/04/2013 13:55

## 2014-05-13 NOTE — H&P (Signed)
PATIENT NAME:  Louis Gutierrez, SCOW MR#:  [id number] DATE OF BIRTH:  1950/05/28  DATE OF ADMISSION:  04/04/2013  PRIMARY CARE PHYSICIAN: Dr. Rebeca Alert at [facility].  REFERRING PHYSICIAN:  Dr. Corky Downs.   CHIEF COMPLAINT: Chest pain.  HISTORY OF PRESENT ILLNESS: The patient is a pleasant 64 year old with a history of CAD status MI back in 2012 for which he received a stent, hypertension, COPD, and obstructive sleep apnea, on CPAP, who presents with chest pain. The pain started acutely this morning after he was shaving and started to bend and pick up an object. The pain was severe, shooting to the left shoulder and to the back. He came into the ER and still had some pain. He was given nitroglycerin, which helped the pain but not very much so and also he did receive morphine. He had a troponin which was negative. When I entered the room, he describes the pain as also a tearing, shooting to his back, and we had to order a CT angio to rule out a dissection which came back without evidence for dissection. We are asked to admit him for his chest pain for further evaluation and management.   PAST MEDICAL HISTORY: CAD status post MI in 2012, hypertension, varicose veins, C-spine surgery while he was a child, COPD, arthritis, obstructive sleep apnea, on CPAP.  ALLERGIES: PENICILLIN.   OUTPATIENT MEDICATIONS: Aspirin 81 mg daily, clopidogrel 75 mg daily, Combivent 2 puffs 3 times a day as needed for shortness of breath, hydrochlorothiazide 25 mg daily, isosorbide mononitrate 30 mg extended release daily, metoprolol titrate 25 mg 2 times a day, paroxetine 40 mg daily, pravastatin 40 mg daily, Q-Var 80 mcg 1 puff 2 times a day, ramipril 10 mg daily, trazodone 100 mg once a day.   FAMILY HISTORY: Positive for cancer, hypertension. Grandmother also had thyroid issues and CAD.   SOCIAL HISTORY: He is married. Still smokes 1/2 pack a day. No alcohol. No other recreational drugs.   REVIEW OF  SYSTEMS: CONSTITUTIONAL: Denies fever, fatigue, weakness, or weight changes.  EYES: No blurry vision or double vision.  ENT: No tinnitus or hearing loss.  RESPIRATORY: No active shortness of breath. He does have a smoker's cough. No painful respirations.  CARDIOVASCULAR: Chest pain as above. No orthopnea or significant swelling in the legs. History of CAD and hypertension.  GASTROINTESTINAL: No nausea, vomiting, diarrhea, abdominal pain or black or tarry stools.  GENITOURINARY: Denies dysuria, hematuria.  HEME AND LYMPH: No anemia or easy bruising.  SKIN: No rashes.  MUSCULOSKELETAL: Has some arthritis. NEUROLOGIC: No focal weakness or numbness.  PSYCHIATRIC: Has some insomnia.   PHYSICAL EXAMINATION: VITAL SIGNS: Temperature on arrival is 97.8, pulse rate 54, respiratory rate 16, blood pressure 132/84, and O2 sat 99% on room air.  GENERAL: The patient is a well-developed male lying in bed, in no obvious distress.  HEENT: Normocephalic, atraumatic. Pupils are equal and reactive. Poor dentition. Anicteric sclerae.  NECK: Supple. No thyroid tenderness. No cervical lymphadenopathy.  HEART: S1 and S2, bradycardic. No significant murmurs appreciated.  LUNGS: Clear to auscultation without wheezing, rhonchi or rales.  ABDOMEN: Soft, nontender, nondistended. Positive bowel sounds in all quadrants.  EXTREMITIES: No pitting edema.  NEUROLOGIC: Cranial nerves II through XII grossly intact. Strength is 5/5 in all extremities. Sensation is intact to light touch.  PSYCHIATRIC: Awake, alert and oriented x3.   LABS AND IMAGING STUDIES: CT angio of the chest shows no evidence of aortic dissection or PE. Scattered atherosclerotic disease. Left adrenal  adenoma, 3.6 x 2.9 cm. Small, nonspecific pulmonary nodules.  EKG: Sinus brady. No acute ST elevations or depressions, although in V2 and V3 there is 1 beat slight ST elevation which does not go to the next beat which I think is more likely an artifact.  Inferior Q waves and incomplete right bundle branch block.   Chest x-ray, 1 view, showing no radiological evidence for acute cardiopulmonary disease.   Glucose 110, BUN 13, creatinine 1.07, sodium 136, potassium 3.9, alk phos 118, AST 14, ALT 15. Troponin negative. White count 5.8, hemoglobin 13.1, platelets 247,000.   ASSESSMENT AND PLAN: We have a 64 year old with hypertension and coronary artery disease status post myocardial infarction in 2012 who presents with chest pain with some typical features with negative troponin. At this point, we would admit this patient to the hospital, resume his aspirin, statin, and beta blocker and start the patient on morphine p.r.n. as well as nitro patch. Would cycle the troponins, obtain a cardiology consult, order an echocardiogram and consider a stress test in the morning, if he does rule out. We did rule him out for acute dissection as he had described his pain at times with tearing characteristic shooting to the back. However, that exam is negative. Would continue his blood pressure medications, start him on deep vein prophylaxis with heparin, continue his paroxetine, and he was counseled for more than 3 minutes about his smoking, and he is motivated to stop. He did request a patch, which I have ordered.   CODE STATUS: FULL.  TOTAL TIME SPENT: 45 minutes.   ____________________________ Vivien Presto, MD sa:sb D: 04/04/2013 14:44:33 ET T: 04/04/2013 15:45:26 ET JOB#: [id number]  cc: Vivien Presto, MD, <Dictator> Abby D. Rebeca Alert MD Vivien Presto MD ELECTRONICALLY SIGNED 04/21/2013 15:48

## 2014-05-13 NOTE — H&P (Signed)
PATIENT NAME:  Louis Gutierrez, Louis Gutierrez MR#:  [id number] DATE OF BIRTH:  March 02, 1950  DATE OF ADMISSION:  05/22/2013  PRIMARY CARE PHYSICIAN: Dr. Marlou Sa   REFERRING PHYSICIAN: Dr. Reita Cliche   CHIEF COMPLAINT: Confusion.   HISTORY OF PRESENT ILLNESS: Louis Gutierrez is a 64 year old pleasant white male with past medical history of hypertension, hyperlipidemia, coronary artery disease status post stent placement, who presented to the Emergency Department with complaints of the confusion that started since this evening. The patient went to his friend's to work on a car, came back, and his wife noticed him to have left facial droop. The patient was somewhat confused. As the patient woke up early in the morning, wife allowed him to go to sleep, woke up about 2 hours later, continued to be confused with speaking illogically. Was noted to have right-sided weakness. Concerning this, EMS was called and was brought to the Emergency Department. Workup in the Emergency Department was a CT head without contrast shows 2.3 x 2.4-cm hypodensity in the left caudate and putamen compatible with acute infarct, mild surrounding mass effect without midline shift. The patient states has a mild headache, has some blurred vision. Had some difficulty swallowing; however, it is improving currently. The patient states the weakness on the right side is improving from the time of presentation.   PAST MEDICAL HISTORY:  1. Coronary artery disease status post stent placement.  2. Hypertension.  3. Varicose veins. 4. COPD.  5. Osteoarthritis.  6. Obstructive sleep apnea on CPAP.  7. Depression. 8. Insomnia.   PAST SURGICAL HISTORY: C-spine surgery.   ALLERGIES: PENICILLIN.   HOME MEDICATIONS:  1. Trazodone 100 mg once a day.  2. Ramipril 10 mg once a day. 3. Qvar 1 puff 2 times a day.  3. Pravastatin 40 mg once daily.  4. Paroxetine 40 mg once a day. 5. Metoprolol 25 mg 2 times a day.  6. Imdur 30 mg once a day.  7.  Hydrochlorothiazide 25 mg once a day.  8. Combivent 2 puffs 3 times a day.  9. Plavix 75 mg once a day. 10. Aspirin 81 mg once a day.   SOCIAL HISTORY: Continues to smoke 1 pack a day. Denies drinking alcohol or using illicit drugs. Married, lives with his wife. Works as a Dealer.   FAMILY HISTORY: Brother having kidney transplant, coronary artery disease bypass grafting.   REVIEW OF SYSTEMS:  CONSTITUTIONAL: Experiencing generalized weakness.  EYES: Blurred vision.  ENT: No change in hearing.  RESPIRATORY: No cough, shortness of breath.  CARDIOVASCULAR: No chest pain, palpations.  GASTROINTESTINAL: No nausea, vomiting, abdominal pain.  GENITOURINARY: No dysuria or hematuria.  ENDOCRINE: No polyuria or polydipsia.  HEMATOLOGIC: No easy bruising or bleeding.  SKIN: No rash or lesions.  MUSCULOSKELETAL: No joint pains and aches.  NEUROLOGIC: Has had right facial droop right-sided weakness, some confusion.   PHYSICAL EXAMINATION:  GENERAL: This is a well-built, well-nourished, age-appropriate male lying down in the bed, not in distress.  VITAL SIGNS: Temperature 97.8, pulse 60, blood pressure 125/79, respiratory rate of 20, oxygen saturation is 96% on room air.  HEENT: Head normocephalic, atraumatic. No scleral icterus. Conjunctivae normal. Pupils equal and reactive. Extraocular movements are intact. Mucous membranes moist, no pharyngeal erythema.  NECK: Supple. No lymphadenopathy. No JVD. No carotid bruit. No thyromegaly.  CHEST: Has no focal tenderness.  LUNGS: Bilaterally clear to auscultation.  HEART: S1, S2 regular. No murmurs are heard.  ABDOMEN: Bowel sounds present. Soft, nontender, nondistended. Could not appreciate any  hepatosplenomegaly.  EXTREMITIES: No pedal edema. Pulses 2+.  SKIN: No rash or lesions.  MUSCULOSKELETAL: Good range of motion in all the extremities.  NEUROLOGIC: The patient is alert and oriented to place, person, and time. Cranial nerves: Mild placement  of the right facial nasolabial fold; has a mild pronator drift on the right side, 4/5 on the right side, left 5/5 in upper and lower extremities. Babinski equivocal on the right side, downgoing on the left.   LABORATORY DATA: Chest x-ray PA and lateral: Increasing infiltration on the left mid lung suggestive of pneumonia superimposed underlying fibrotic changes.   CT head without contrast 2.3 x 2.4-cm hypodensity in the left caudated putamen area, compatible with the acute stroke.   CBC: WBC of 7.3, hemoglobin 12.1.  BMP is completely within normal limits.   ASSESSMENT AND PLAN: Louis Gutierrez is a 64 year old male with history of coronary artery disease, continued tobacco use, presented to the Emergency Department with acute stroke.  1. Acute stroke. Admit the patient to a monitored bed. Obtain MRA of the brain, carotid Dopplers, and echocardiogram. Involve physical therapy, occupational therapy, and speech therapy. The patient is already on aspirin and Plavix as well as statin.  2. Pneumonia. Keep the patient on Rocephin and Zithromax treating as a community-acquired pneumonia.  3. Hypertension: Will hold the blood pressure medications with permissive hypertension.  4. Coronary artery disease status post stent placement. Continue with metoprolol. Hold the Imdur for now. Continue with Ramipril at a low dose.  5. Tobacco use. Counseled the patient. The patient expressed understanding.  6. Keep the patient on deep vein thrombosis prophylaxis with Lovenox.   TIME SPENT: 50 minutes.   ____________________________ Monica Becton, MD pv:lt D: 05/22/2013 00:42:19 ET T: 05/22/2013 01:27:38 ET JOB#: [id number]  cc: Monica Becton, MD, <Dictator> Marlou Sa, MD  Monica Becton MD ELECTRONICALLY SIGNED 05/23/2013 21:03

## 2014-05-13 NOTE — Discharge Summary (Signed)
PATIENT NAME:  Louis Gutierrez, Louis Gutierrez MR#:  [id number] DATE OF BIRTH:  May 07, 1950  DATE OF ADMISSION:  04/04/2013 DATE OF DISCHARGE:  04/05/2013  For a detailed note, please send the history and physical done on admission by Dr. Isaiah Serge.   DIAGNOSES AT DISCHARGE: As follows: Chest pain, likely noncardiac. Hypertension.  History of coronary artery disease. Ongoing tobacco abuse. Anxiety. Hyperlipidemia.   The patient is being discharged home on a low-sodium, low-fat diet.   ACTIVITY: As tolerated.   FOLLOWUP: Dr. Marlou Sa in the next 1 to 2 weeks.   DISCHARGE MEDICATIONS: Combivent 2 puffs t.i.d. as needed, Qvar 80 mcg 1 puff b.i.d., metoprolol tartrate 25 mg b.i.d., Paxil 40 mg daily, aspirin 81 mg daily, Imdur 30 mg daily, Plavix 75 mg daily, Pravachol 40 mg at bedtime, trazodone 100 mg daily, HCTZ 25 mg daily, ramipril 10 mg daily.  [place] COURSE: Dr. Neoma Laming from cardiology.   PERTINENT STUDIES DONE DURING THE HOSPITAL COURSE: Are as follows: A CT scan of the chest done with contrast showing no evidence of aortic dissection or pulmonary embolism. Left adrenal adenoma. A chest x-ray done on admission showing no acute cardiopulmonary disease. A cardiac catheterization done on March 17 by Dr. Humphrey Rolls showing EF  fraction of 55%, stent in the mid RCA patent, mild mid-LAD disease. Left circumflex normal.   HOSPITAL COURSE: This is a 64 year old male with medical problems as mentioned above, who presented to the hospital with chest pain.  1. Chest pain. Given the patient's previous history of coronary artery disease and his typical symptoms for angina, he was observed overnight on telemetry, had 3 sets of cardiac markers checked, which were negative. He was seen by cardiology, by Dr. Humphrey Rolls, who performed a cardiac catheterization which showed no evidence of any significant coronary artery disease. The patient was chest-pain-free and hemodynamically stable at the time of  discharge. He will continue his aspirin, Plavix, statin and beta blocker as stated.  2. History of COPD. The patient had no evidence of COPD exacerbation. He was maintained on his Combivent and Qvar and he was to continue that.  3. Hypertension. The patient was maintained on his ramipril, HCTZ, metoprolol and Imdur and remained hemodynamically stable and he will resume that.  4. Hyperlipidemia. The patient was maintained on his Pravachol and he will resume that.  5. Anxiety. The patient was maintained on his Paxil and he will also resume that upon discharge.  The patient will have close followup with cardiology as an outpatient in the next 1 to 2 weeks.   TIME SPENT: 35 minutes    ____________________________ Belia Heman. Verdell Carmine, MD vjs:lm D: 04/06/2013 16:38:25 ET T: 04/06/2013 23:39:34 ET JOB#: [id number]  cc: Belia Heman. Verdell Carmine, MD, <Dictator> Loma Messing, MD Henreitta Leber MD ELECTRONICALLY SIGNED 04/15/2013 83:09

## 2014-05-15 ENCOUNTER — Ambulatory Visit (INDEPENDENT_AMBULATORY_CARE_PROVIDER_SITE_OTHER): Payer: Medicare HMO | Admitting: *Deleted

## 2014-05-15 DIAGNOSIS — I639 Cerebral infarction, unspecified: Secondary | ICD-10-CM

## 2014-05-17 NOTE — Progress Notes (Signed)
Loop recorder 

## 2014-05-19 ENCOUNTER — Encounter: Payer: Self-pay | Admitting: Cardiovascular Disease

## 2014-06-09 DIAGNOSIS — M1120 Other chondrocalcinosis, unspecified site: Secondary | ICD-10-CM | POA: Diagnosis not present

## 2014-06-09 DIAGNOSIS — F325 Major depressive disorder, single episode, in full remission: Secondary | ICD-10-CM | POA: Insufficient documentation

## 2014-06-09 DIAGNOSIS — R739 Hyperglycemia, unspecified: Secondary | ICD-10-CM | POA: Insufficient documentation

## 2014-06-09 DIAGNOSIS — I672 Cerebral atherosclerosis: Secondary | ICD-10-CM

## 2014-06-09 DIAGNOSIS — I10 Essential (primary) hypertension: Secondary | ICD-10-CM

## 2014-06-09 DIAGNOSIS — I2510 Atherosclerotic heart disease of native coronary artery without angina pectoris: Secondary | ICD-10-CM | POA: Insufficient documentation

## 2014-06-09 DIAGNOSIS — J449 Chronic obstructive pulmonary disease, unspecified: Secondary | ICD-10-CM | POA: Insufficient documentation

## 2014-06-09 DIAGNOSIS — J441 Chronic obstructive pulmonary disease with (acute) exacerbation: Secondary | ICD-10-CM | POA: Insufficient documentation

## 2014-06-09 DIAGNOSIS — F3341 Major depressive disorder, recurrent, in partial remission: Secondary | ICD-10-CM | POA: Diagnosis not present

## 2014-06-09 DIAGNOSIS — Z23 Encounter for immunization: Secondary | ICD-10-CM | POA: Diagnosis not present

## 2014-06-09 DIAGNOSIS — M1180 Other specified crystal arthropathies, unspecified site: Secondary | ICD-10-CM | POA: Insufficient documentation

## 2014-06-09 DIAGNOSIS — I25119 Atherosclerotic heart disease of native coronary artery with unspecified angina pectoris: Secondary | ICD-10-CM | POA: Diagnosis not present

## 2014-06-09 HISTORY — DX: Essential (primary) hypertension: I10

## 2014-06-09 HISTORY — DX: Cerebral atherosclerosis: I67.2

## 2014-06-13 ENCOUNTER — Ambulatory Visit (INDEPENDENT_AMBULATORY_CARE_PROVIDER_SITE_OTHER): Payer: Commercial Managed Care - HMO | Admitting: *Deleted

## 2014-06-13 DIAGNOSIS — I639 Cerebral infarction, unspecified: Secondary | ICD-10-CM | POA: Diagnosis not present

## 2014-06-16 NOTE — Progress Notes (Signed)
Loop recorder 

## 2014-06-20 LAB — CUP PACEART REMOTE DEVICE CHECK: Date Time Interrogation Session: 20160531151027

## 2014-06-22 DIAGNOSIS — H5213 Myopia, bilateral: Secondary | ICD-10-CM | POA: Diagnosis not present

## 2014-06-22 DIAGNOSIS — H5210 Myopia, unspecified eye: Secondary | ICD-10-CM | POA: Diagnosis not present

## 2014-06-23 LAB — CUP PACEART REMOTE DEVICE CHECK: Date Time Interrogation Session: 20160603142944

## 2014-07-03 ENCOUNTER — Encounter: Payer: Self-pay | Admitting: Cardiovascular Disease

## 2014-07-13 ENCOUNTER — Ambulatory Visit (INDEPENDENT_AMBULATORY_CARE_PROVIDER_SITE_OTHER): Payer: Commercial Managed Care - HMO | Admitting: *Deleted

## 2014-07-13 DIAGNOSIS — I639 Cerebral infarction, unspecified: Secondary | ICD-10-CM | POA: Diagnosis not present

## 2014-07-13 NOTE — Progress Notes (Signed)
Loop recorder 

## 2014-07-14 ENCOUNTER — Encounter: Payer: Self-pay | Admitting: Cardiovascular Disease

## 2014-07-18 LAB — CUP PACEART REMOTE DEVICE CHECK: Date Time Interrogation Session: 20160628114313

## 2014-08-03 ENCOUNTER — Other Ambulatory Visit: Payer: Self-pay

## 2014-08-03 ENCOUNTER — Emergency Department: Payer: Commercial Managed Care - HMO

## 2014-08-03 ENCOUNTER — Emergency Department
Admission: EM | Admit: 2014-08-03 | Discharge: 2014-08-03 | Disposition: A | Discharge status: Home / Self Care | Payer: Commercial Managed Care - HMO | Attending: Emergency Medicine | Admitting: Emergency Medicine

## 2014-08-03 ENCOUNTER — Telehealth: Payer: Self-pay | Admitting: Cardiovascular Disease

## 2014-08-03 DIAGNOSIS — Z79899 Other long term (current) drug therapy: Secondary | ICD-10-CM | POA: Diagnosis not present

## 2014-08-03 DIAGNOSIS — R0602 Shortness of breath: Secondary | ICD-10-CM | POA: Diagnosis not present

## 2014-08-03 DIAGNOSIS — R531 Weakness: Secondary | ICD-10-CM | POA: Diagnosis not present

## 2014-08-03 DIAGNOSIS — J441 Chronic obstructive pulmonary disease with (acute) exacerbation: Secondary | ICD-10-CM | POA: Diagnosis not present

## 2014-08-03 DIAGNOSIS — Z7951 Long term (current) use of inhaled steroids: Secondary | ICD-10-CM | POA: Diagnosis not present

## 2014-08-03 DIAGNOSIS — Z880 Allergy status to penicillin: Secondary | ICD-10-CM | POA: Diagnosis not present

## 2014-08-03 DIAGNOSIS — Z720 Tobacco use: Secondary | ICD-10-CM | POA: Diagnosis not present

## 2014-08-03 DIAGNOSIS — I10 Essential (primary) hypertension: Secondary | ICD-10-CM | POA: Insufficient documentation

## 2014-08-03 DIAGNOSIS — M6281 Muscle weakness (generalized): Secondary | ICD-10-CM | POA: Diagnosis not present

## 2014-08-03 DIAGNOSIS — Z7982 Long term (current) use of aspirin: Secondary | ICD-10-CM | POA: Diagnosis not present

## 2014-08-03 DIAGNOSIS — Z7902 Long term (current) use of antithrombotics/antiplatelets: Secondary | ICD-10-CM | POA: Diagnosis not present

## 2014-08-03 DIAGNOSIS — J449 Chronic obstructive pulmonary disease, unspecified: Secondary | ICD-10-CM | POA: Diagnosis not present

## 2014-08-03 LAB — BASIC METABOLIC PANEL
Anion gap: 8 (ref 5–15)
BUN: 19 mg/dL (ref 6–20)
CHLORIDE: 102 mmol/L (ref 101–111)
CO2: 25 mmol/L (ref 22–32)
Calcium: 9.3 mg/dL (ref 8.9–10.3)
Creatinine, Ser: 1.04 mg/dL (ref 0.61–1.24)
GFR calc non Af Amer: >60 mL/min (ref >60)
Glucose, Bld: 111 mg/dL — ABNORMAL HIGH (ref 65–99)
Potassium: 4 mmol/L (ref 3.5–5.1)
Sodium: 135 mmol/L (ref 135–145)

## 2014-08-03 LAB — CBC
HEMATOCRIT: 39.1 % — AB (ref 40.0–52.0)
HEMOGLOBIN: 12.9 g/dL — AB (ref 13.0–18.0)
MCH: 28 pg (ref 26.0–34.0)
MCHC: 33 g/dL (ref 32.0–36.0)
MCV: 84.6 fL (ref 80.0–100.0)
Platelets: 275 10*3/uL (ref 150–440)
RBC: 4.62 MIL/uL (ref 4.40–5.90)
RDW: 15.1 % — ABNORMAL HIGH (ref 11.5–14.5)
WBC: 7.3 10*3/uL (ref 3.8–10.6)

## 2014-08-03 LAB — BRAIN NATRIURETIC PEPTIDE: B Natriuretic Peptide: 35 pg/mL (ref 0.0–100.0)

## 2014-08-03 LAB — TROPONIN I: Troponin I: <0.03 ng/mL (ref <0.031)

## 2014-08-03 NOTE — ED Provider Notes (Signed)
[facility] Emergency Department Provider Note  ____________________________________________  Time seen: Approximately 310 PM  I have reviewed the triage vital signs and the nursing notes.   HISTORY  Chief Complaint Weakness and Shortness of Breath    HPI Taiwo Fish is a 64 y.o. male with a history of coronary artery disease and CVA who presents today with shortness of breath since yesterday. The patient denies any chest pain. He says that the shortness of breath occurs mainly when he is walking outside in the heat. The 2 episodes that were most pronounced right 9 AM this morning when he was out walking his dog at 9 AM. He said that he also was outside talking on the phone for about 45 minutes afternoon about 1:00 and became diaphoretic and short of breath. At that time he checked his pulse ox at home which was 88%. He has not had any cough, nausea, vomiting or fever. The patient is still a 2 pack-a-day smoker. he says that he did take his aspirin and Plavix this morning.He says that the weakness is diffuse. Does not note any focal deficits, slurred speech or facial droop. The weakness is concomitant with the shortness of breath.   Past Medical History  Diagnosis Date  . Hypertension 06/10/2010    ECHO at [facility] - see Media tab  . STEMI (ST elevation myocardial infarction) 06/07/2010  . Mixed hyperlipidemia   . OSA (obstructive sleep apnea)   . COPD (chronic obstructive pulmonary disease)   . Tobacco abuse   . CAD (coronary artery disease)   . Obesity   . CVA (cerebral vascular accident) 2015    Patient Active Problem List   Diagnosis Date Noted  . Status post placement of implantable loop recorder 08/24/2013  . Stroke of unknown cause 08/06/2013  . Tobacco abuse 06/30/2012  . COPD (chronic obstructive pulmonary disease) 06/30/2012  . OSA (obstructive sleep apnea)   . CAD (coronary artery disease)   . Mixed hyperlipidemia   . Obesity   .  Hypertension 06/10/2010  . STEMI (ST elevation myocardial infarction) 06/07/2010    Past Surgical History  Procedure Laterality Date  . Cardiac catheterization  06/07/2010    50 % ostial narrowing in 1st diagonal from LAD, 2nd diagonal had 40% ostial narrowing; AV groove circumflex was diminutive and occluded proximally; RCA totally occluded in mid segment - stented with 3.5 x 80mm Promus Element DES  - See Media tab  . Loop recorder implant  08/16/13    Dr. Sallyanne Kuster  . Cardiac catheterization  05/2013    [place]    Current Outpatient Rx  Name  Route  Sig  Dispense  Refill  . acetaminophen (TYLENOL) 500 MG tablet   Oral   Take 500 mg by mouth as needed for pain.         Marland Kitchen albuterol (PROVENTIL HFA;VENTOLIN HFA) 108 (90 BASE) MCG/ACT inhaler   Inhalation   Inhale 1 puff into the lungs 2 (two) times daily.         Marland Kitchen aspirin EC 81 MG tablet   Oral   Take 81 mg by mouth daily.         . beclomethasone (QVAR) 80 MCG/ACT inhaler   Inhalation   Inhale 1 puff into the lungs 2 (two) times daily.         . clopidogrel (PLAVIX) 75 MG tablet   Oral   Take 1 tablet (75 mg total) by mouth daily.   30 tablet  8   . isosorbide mononitrate (IMDUR) 30 MG 24 hr tablet   Oral   Take 1 tablet (30 mg total) by mouth daily.   90 tablet   3   . nitroGLYCERIN (NITROSTAT) 0.4 MG SL tablet   Sublingual   Place 1 tablet (0.4 mg total) under the tongue every 5 (five) minutes as needed for chest pain.   25 tablet   3   . PARoxetine (PAXIL) 40 MG tablet   Oral   Take 40 mg by mouth daily.         . pravastatin (PRAVACHOL) 40 MG tablet   Oral   Take 1 tablet (40 mg total) by mouth daily.   30 tablet   11   . ramipril (ALTACE) 10 MG capsule   Oral   Take 1 capsule (10 mg total) by mouth daily.   30 capsule   10     Patient ha appointment 12/20/13 with Dr. Sallyanne Kuster   . traZODone (DESYREL) 100 MG tablet   Oral   Take 100 mg by mouth at bedtime as needed for sleep.            Allergies Penicillins  Family History  Problem Relation Age of Onset  . Cancer Mother   . Stroke Maternal Grandmother   . Diabetes Brother   . Coronary artery disease Brother     Social History History  Substance Use Topics  . Smoking status: Current Every Day Smoker -- 1.00 packs/day for 48 years    Types: Cigarettes  . Smokeless tobacco: Not on file  . Alcohol Use: No    Review of Systems Constitutional: No fever/chills Eyes: No visual changes. ENT: No sore throat. Cardiovascular: Denies chest pain. Respiratory: As above  Gastrointestinal: No abdominal pain.  No nausea, no vomiting.  No diarrhea.  No constipation. Genitourinary: Negative for dysuria. Musculoskeletal: Negative for back pain. Skin: Negative for rash. Neurological: Negative for headaches, focal weakness or numbness.  10-point ROS otherwise negative.  ____________________________________________   PHYSICAL EXAM:  VITAL SIGNS: ED Triage Vitals  Enc Vitals Group     BP 08/03/14 1337 121/71 mmHg     Pulse Rate 08/03/14 1337 80     Resp 08/03/14 1337 18     Temp 08/03/14 1337 97.7 F (36.5 C)     Temp Source 08/03/14 1337 Oral     SpO2 08/03/14 1337 97 %     Weight 08/03/14 1337 214 lb (97.07 kg)     Height 08/03/14 1337 5\' 11"  (1.803 m)     Head Cir --      Peak Flow --      Pain Score 08/03/14 1337 3     Pain Loc --      Pain Edu? --      Excl. in [place]? --     Constitutional: Alert and oriented. Well appearing and in no acute distress. Eyes: Conjunctivae are normal. PERRL. EOMI. Head: Atraumatic. Nose: No congestion/rhinnorhea. Mouth/Throat: Mucous membranes are moist.  Oropharynx non-erythematous. Neck: No stridor.   Cardiovascular: Normal rate, regular rhythm. Grossly normal heart sounds.  Good peripheral circulation. Respiratory: Normal respiratory effort.  No retractions. Lungs CTAB. Gastrointestinal: Soft and nontender. No distention. No abdominal bruits. No CVA  tenderness. Musculoskeletal: No lower extremity tenderness nor edema.  No joint effusions. Neurologic:  Normal speech and language. No gross focal neurologic deficits are appreciated. No gait instability. Able to ambulate with a steady gait without any assistance without ataxia.   Skin:  Skin is warm, dry and intact. No rash noted. Psychiatric: Mood and affect are normal. Speech and behavior are normal.  ____________________________________________   LABS (all labs ordered are listed, but only abnormal results are displayed)  Labs Reviewed  CBC - Abnormal; Notable for the following:    Hemoglobin 12.9 (*)    HCT 39.1 (*)    RDW 15.1 (*)    All other components within normal limits  BASIC METABOLIC PANEL - Abnormal; Notable for the following:    Glucose, Bld 111 (*)    All other components within normal limits  TROPONIN I  TROPONIN I  BRAIN NATRIURETIC PEPTIDE   ____________________________________________  EKG  ED ECG REPORT I, Doran Stabler, the attending physician, personally viewed and interpreted this ECG.   Date: 08/03/2014  EKG Time: 1339  Rate: 80  Rhythm: normal sinus rhythm  Axis: Normal axis  Intervals:Incomplete right bundle branch block  ST&T Change: Q waves in 23, aVL and aVF which are unchanged from previous EKG. There is no change from the EKG on 12/05/2013.  ____________________________________________  RADIOLOGY  No acute process.  i personally viewed these images.   ____________________________________________   PROCEDURES    ____________________________________________   INITIAL IMPRESSION / ASSESSMENT AND PLAN / ED COURSE  Pertinent labs & imaging results that were available during my care of the patient were reviewed by me and considered in my medical decision making (see chart for details).  ----------------------------------------- 3:44 PM on 08/03/2014 -----------------------------------------  Discussed case with Dr. Martinique  who works with patient's cardiologist Dr.Croitoru. Says will discuss with the scheduler's office and try to get the patient in for an urgent follow-up appointment tomorrow if appropriate for discharge. He says the office call the patient for scheduling of a follow-up appointment.  ----------------------------------------- 5:23 PM on 08/03/2014 -----------------------------------------  Patient is resting comfortably at this time without any shortness of breath or chest pain. Possible that his symptoms were related to stress from the heat. However, he is able to follow-up at his scheduled appointment tomorrow at 3 PM. At this point he has had an unchanged EKG as well as 2 negative troponins as well as a negative BNP. ____________________________________________   FINAL CLINICAL IMPRESSION(S) / ED DIAGNOSES  Acute shortness of breath. Acute weakness. Initial visit.    Orbie Pyo, MD 08/03/14 [id number]

## 2014-08-03 NOTE — ED Notes (Signed)
Pt c/o generalized weakness with SOB since yesterday..denies chest pain.Louis Gutierrez

## 2014-08-03 NOTE — Telephone Encounter (Signed)
Dr. [facility], called and spoke to Dr. Martinique regarding patient's SOB status. Dr. Martinique is requesting patient be seen tomorrow by [facility] provider. Patient scheduled to see Truitt Merle, NP, tomorrow, July 15th at 3:00 pm, at the [employer] [employer]. Notified Dr. Clearnce Hasten of scheduled appointment. Patient still in ED. Dr. Clearnce Hasten will provide pt with information and directions to office for tomorrow's appt.

## 2014-08-03 NOTE — ED Notes (Signed)
Patient declined wheelchair at discharge.

## 2014-08-04 ENCOUNTER — Ambulatory Visit: Payer: Commercial Managed Care - HMO | Admitting: Nurse Practitioner

## 2014-08-08 ENCOUNTER — Other Ambulatory Visit: Payer: Self-pay | Admitting: *Deleted

## 2014-08-08 MED ORDER — CLOPIDOGREL BISULFATE 75 MG PO TABS: 75.0000 mg | ORAL_TABLET | Freq: Every day | ORAL | Status: DC

## 2014-08-08 NOTE — Telephone Encounter (Signed)
Rx(s) sent to pharmacy electronically.  

## 2014-08-11 ENCOUNTER — Ambulatory Visit (INDEPENDENT_AMBULATORY_CARE_PROVIDER_SITE_OTHER): Payer: Commercial Managed Care - HMO | Admitting: *Deleted

## 2014-08-11 DIAGNOSIS — I639 Cerebral infarction, unspecified: Secondary | ICD-10-CM

## 2014-08-21 ENCOUNTER — Other Ambulatory Visit
Admission: RE | Admit: 2014-08-21 | Discharge: 2014-08-21 | Disposition: A | Discharge status: Home / Self Care | Payer: Commercial Managed Care - HMO | Source: Other Acute Inpatient Hospital | Attending: Internal Medicine | Admitting: Internal Medicine

## 2014-08-21 DIAGNOSIS — J449 Chronic obstructive pulmonary disease, unspecified: Secondary | ICD-10-CM | POA: Diagnosis not present

## 2014-08-21 DIAGNOSIS — I25119 Atherosclerotic heart disease of native coronary artery with unspecified angina pectoris: Secondary | ICD-10-CM | POA: Diagnosis not present

## 2014-08-21 DIAGNOSIS — R079 Chest pain, unspecified: Secondary | ICD-10-CM | POA: Insufficient documentation

## 2014-08-21 LAB — FIBRIN DERIVATIVES D-DIMER (ARMC ONLY): FIBRIN DERIVATIVES D-DIMER (ARMC): 776.84 — AB (ref 0–499)

## 2014-08-22 ENCOUNTER — Ambulatory Visit
Admission: RE | Admit: 2014-08-22 | Discharge: 2014-08-22 | Disposition: A | Discharge status: Home / Self Care | Payer: Commercial Managed Care - HMO | Source: Ambulatory Visit | Attending: Internal Medicine | Admitting: Internal Medicine

## 2014-08-22 DIAGNOSIS — J90 Pleural effusion, not elsewhere classified: Secondary | ICD-10-CM | POA: Diagnosis not present

## 2014-08-22 DIAGNOSIS — R0602 Shortness of breath: Secondary | ICD-10-CM

## 2014-08-22 DIAGNOSIS — J9811 Atelectasis: Secondary | ICD-10-CM | POA: Diagnosis not present

## 2014-08-22 DIAGNOSIS — I2510 Atherosclerotic heart disease of native coronary artery without angina pectoris: Secondary | ICD-10-CM | POA: Insufficient documentation

## 2014-08-22 DIAGNOSIS — N281 Cyst of kidney, acquired: Secondary | ICD-10-CM | POA: Insufficient documentation

## 2014-08-22 DIAGNOSIS — D3502 Benign neoplasm of left adrenal gland: Secondary | ICD-10-CM | POA: Insufficient documentation

## 2014-08-22 DIAGNOSIS — R918 Other nonspecific abnormal finding of lung field: Secondary | ICD-10-CM | POA: Diagnosis not present

## 2014-08-22 MED ORDER — IOHEXOL 350 MG/ML SOLN
75.0000 mL | Freq: Once | INTRAVENOUS | Status: AC | PRN
  Administered 2014-08-22: 75 mL via INTRAVENOUS

## 2014-08-23 ENCOUNTER — Other Ambulatory Visit: Payer: Self-pay | Admitting: Internal Medicine

## 2014-08-23 ENCOUNTER — Other Ambulatory Visit: Payer: Self-pay | Admitting: *Deleted

## 2014-08-23 DIAGNOSIS — N2889 Other specified disorders of kidney and ureter: Secondary | ICD-10-CM

## 2014-08-23 DIAGNOSIS — R7989 Other specified abnormal findings of blood chemistry: Secondary | ICD-10-CM

## 2014-08-23 MED ORDER — PRAVASTATIN SODIUM 40 MG PO TABS: 40.0000 mg | ORAL_TABLET | Freq: Every day | ORAL | Status: DC

## 2014-08-25 ENCOUNTER — Ambulatory Visit: Payer: Commercial Managed Care - HMO

## 2014-08-25 ENCOUNTER — Telehealth: Payer: Self-pay | Admitting: Cardiovascular Disease

## 2014-08-25 NOTE — Telephone Encounter (Signed)
Pt's wife called in stating that the pt will be having a MRI done tomorrow and he was told that some information was needed from his Cardiologist before he could have it done. Please call Contact pt on at his number   Thanks

## 2014-08-26 ENCOUNTER — Ambulatory Visit
Admission: RE | Admit: 2014-08-26 | Discharge: 2014-08-26 | Disposition: A | Discharge status: Home / Self Care | Payer: Commercial Managed Care - HMO | Source: Ambulatory Visit | Attending: Internal Medicine | Admitting: Internal Medicine

## 2014-08-26 DIAGNOSIS — D3502 Benign neoplasm of left adrenal gland: Secondary | ICD-10-CM | POA: Insufficient documentation

## 2014-08-26 DIAGNOSIS — N281 Cyst of kidney, acquired: Secondary | ICD-10-CM | POA: Diagnosis not present

## 2014-08-26 DIAGNOSIS — N2889 Other specified disorders of kidney and ureter: Secondary | ICD-10-CM | POA: Insufficient documentation

## 2014-08-26 DIAGNOSIS — K8020 Calculus of gallbladder without cholecystitis without obstruction: Secondary | ICD-10-CM | POA: Insufficient documentation

## 2014-08-26 DIAGNOSIS — R934 Abnormal findings on diagnostic imaging of urinary organs: Secondary | ICD-10-CM | POA: Diagnosis not present

## 2014-08-26 MED ORDER — GADOBENATE DIMEGLUMINE 529 MG/ML IV SOLN
20.0000 mL | Freq: Once | INTRAVENOUS | Status: AC | PRN
  Administered 2014-08-26: 20 mL via INTRAVENOUS

## 2014-08-29 NOTE — Progress Notes (Signed)
Loop recorder 

## 2014-08-31 ENCOUNTER — Other Ambulatory Visit: Payer: Self-pay | Admitting: Cardiovascular Disease

## 2014-08-31 NOTE — Telephone Encounter (Signed)
REFILL 

## 2014-09-01 NOTE — Telephone Encounter (Signed)
Was this taken care of?

## 2014-09-01 NOTE — Telephone Encounter (Signed)
Returned call to patient he stated he already had MRI of abd.He has received results from his PCP.

## 2014-09-04 ENCOUNTER — Other Ambulatory Visit: Payer: Self-pay | Admitting: Internal Medicine

## 2014-09-04 DIAGNOSIS — N289 Disorder of kidney and ureter, unspecified: Secondary | ICD-10-CM

## 2014-09-04 DIAGNOSIS — M545 Low back pain: Secondary | ICD-10-CM

## 2014-09-04 LAB — CUP PACEART REMOTE DEVICE CHECK: MDC IDC SESS DTM: 20160815080622

## 2014-09-07 ENCOUNTER — Encounter: Payer: Self-pay | Admitting: Cardiovascular Disease

## 2014-09-11 ENCOUNTER — Ambulatory Visit (INDEPENDENT_AMBULATORY_CARE_PROVIDER_SITE_OTHER): Payer: Commercial Managed Care - HMO | Admitting: *Deleted

## 2014-09-11 DIAGNOSIS — I639 Cerebral infarction, unspecified: Secondary | ICD-10-CM | POA: Diagnosis not present

## 2014-09-13 NOTE — Progress Notes (Signed)
Loop recorder 

## 2014-09-18 LAB — CUP PACEART REMOTE DEVICE CHECK: Date Time Interrogation Session: 20160829085841

## 2014-09-26 ENCOUNTER — Telehealth: Payer: Self-pay | Admitting: Cardiovascular Disease

## 2014-09-26 MED ORDER — PRAVASTATIN SODIUM 40 MG PO TABS: 40.0000 mg | ORAL_TABLET | Freq: Every day | ORAL | Status: DC

## 2014-09-26 NOTE — Telephone Encounter (Signed)
Refill submitted to patient's preferred pharmacy. Informed patient. Pt voiced understanding, no other stated concerns at this time.  

## 2014-09-26 NOTE — Telephone Encounter (Signed)
°  1. Which medications need to be refilled? Pravastatin   2. Which pharmacy is medication to be sent to?[facility]   3. Do they need a 30 day or 90 day supply? 30  4. Would they like a call back once the medication has been sent to the pharmacy? Yes

## 2014-10-02 ENCOUNTER — Encounter: Payer: Self-pay | Admitting: Cardiovascular Disease

## 2014-10-11 ENCOUNTER — Ambulatory Visit (INDEPENDENT_AMBULATORY_CARE_PROVIDER_SITE_OTHER): Payer: Commercial Managed Care - HMO | Admitting: *Deleted

## 2014-10-11 DIAGNOSIS — I639 Cerebral infarction, unspecified: Secondary | ICD-10-CM

## 2014-10-11 NOTE — Progress Notes (Signed)
Loop recorder 

## 2014-10-13 ENCOUNTER — Encounter: Payer: Self-pay | Admitting: Cardiovascular Disease

## 2014-10-16 LAB — CUP PACEART REMOTE DEVICE CHECK: Date Time Interrogation Session: 20160926090043

## 2014-10-16 NOTE — Progress Notes (Signed)
Carelink summary report received. Battery status OK. Normal device function. No new symptom episodes, tachy episodes, brady, or pause episodes. No new AF episodes. Monthly summary reports and ROV with [place] on 12/26/14 at 10:00am.

## 2014-11-08 ENCOUNTER — Other Ambulatory Visit: Payer: Self-pay | Admitting: Cardiovascular Disease

## 2014-11-10 ENCOUNTER — Ambulatory Visit (INDEPENDENT_AMBULATORY_CARE_PROVIDER_SITE_OTHER): Payer: Commercial Managed Care - HMO | Admitting: *Deleted

## 2014-11-10 DIAGNOSIS — I638 Other cerebral infarction: Secondary | ICD-10-CM

## 2014-11-10 DIAGNOSIS — I6389 Other cerebral infarction: Secondary | ICD-10-CM

## 2014-11-13 ENCOUNTER — Encounter: Payer: Self-pay | Admitting: Cardiovascular Disease

## 2014-11-13 NOTE — Progress Notes (Signed)
Loop recorder 

## 2014-12-03 ENCOUNTER — Emergency Department: Payer: No Typology Code available for payment source

## 2014-12-03 ENCOUNTER — Emergency Department
Admission: EM | Admit: 2014-12-03 | Discharge: 2014-12-03 | Disposition: A | Discharge status: Home / Self Care | Payer: No Typology Code available for payment source | Attending: Emergency Medicine | Admitting: Emergency Medicine

## 2014-12-03 ENCOUNTER — Encounter: Payer: Self-pay | Admitting: Emergency Medicine

## 2014-12-03 DIAGNOSIS — S161XXA Strain of muscle, fascia and tendon at neck level, initial encounter: Secondary | ICD-10-CM | POA: Diagnosis not present

## 2014-12-03 DIAGNOSIS — Z79899 Other long term (current) drug therapy: Secondary | ICD-10-CM | POA: Diagnosis not present

## 2014-12-03 DIAGNOSIS — Z880 Allergy status to penicillin: Secondary | ICD-10-CM | POA: Diagnosis not present

## 2014-12-03 DIAGNOSIS — R0602 Shortness of breath: Secondary | ICD-10-CM | POA: Diagnosis not present

## 2014-12-03 DIAGNOSIS — F17210 Nicotine dependence, cigarettes, uncomplicated: Secondary | ICD-10-CM | POA: Diagnosis not present

## 2014-12-03 DIAGNOSIS — Y92410 Unspecified street and highway as the place of occurrence of the external cause: Secondary | ICD-10-CM | POA: Diagnosis not present

## 2014-12-03 DIAGNOSIS — Z7902 Long term (current) use of antithrombotics/antiplatelets: Secondary | ICD-10-CM | POA: Insufficient documentation

## 2014-12-03 DIAGNOSIS — S299XXA Unspecified injury of thorax, initial encounter: Secondary | ICD-10-CM | POA: Diagnosis not present

## 2014-12-03 DIAGNOSIS — Z7951 Long term (current) use of inhaled steroids: Secondary | ICD-10-CM | POA: Diagnosis not present

## 2014-12-03 DIAGNOSIS — Y9389 Activity, other specified: Secondary | ICD-10-CM | POA: Insufficient documentation

## 2014-12-03 DIAGNOSIS — I10 Essential (primary) hypertension: Secondary | ICD-10-CM | POA: Diagnosis not present

## 2014-12-03 DIAGNOSIS — M7918 Myalgia, other site: Secondary | ICD-10-CM

## 2014-12-03 DIAGNOSIS — Y998 Other external cause status: Secondary | ICD-10-CM | POA: Diagnosis not present

## 2014-12-03 DIAGNOSIS — M791 Myalgia: Secondary | ICD-10-CM | POA: Diagnosis not present

## 2014-12-03 DIAGNOSIS — M542 Cervicalgia: Secondary | ICD-10-CM | POA: Diagnosis not present

## 2014-12-03 DIAGNOSIS — R52 Pain, unspecified: Secondary | ICD-10-CM | POA: Diagnosis not present

## 2014-12-03 DIAGNOSIS — Z7982 Long term (current) use of aspirin: Secondary | ICD-10-CM | POA: Diagnosis not present

## 2014-12-03 DIAGNOSIS — S199XXA Unspecified injury of neck, initial encounter: Secondary | ICD-10-CM | POA: Diagnosis not present

## 2014-12-03 MED ORDER — TRAMADOL HCL 50 MG PO TABS: 50.0000 mg | ORAL_TABLET | Freq: Four times a day (QID) | ORAL | Status: DC | PRN

## 2014-12-03 MED ORDER — DIAZEPAM 2 MG PO TABS: 2.0000 mg | ORAL_TABLET | Freq: Three times a day (TID) | ORAL | Status: DC | PRN
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
## 2014-12-03 NOTE — Discharge Instructions (Signed)
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
## 2014-12-07 DIAGNOSIS — I25119 Atherosclerotic heart disease of native coronary artery with unspecified angina pectoris: Secondary | ICD-10-CM | POA: Diagnosis not present

## 2014-12-07 DIAGNOSIS — R739 Hyperglycemia, unspecified: Secondary | ICD-10-CM | POA: Diagnosis not present

## 2014-12-07 DIAGNOSIS — F325 Major depressive disorder, single episode, in full remission: Secondary | ICD-10-CM | POA: Diagnosis not present

## 2014-12-07 DIAGNOSIS — I10 Essential (primary) hypertension: Secondary | ICD-10-CM | POA: Diagnosis not present

## 2014-12-07 DIAGNOSIS — J449 Chronic obstructive pulmonary disease, unspecified: Secondary | ICD-10-CM | POA: Diagnosis not present

## 2014-12-07 DIAGNOSIS — M542 Cervicalgia: Secondary | ICD-10-CM | POA: Diagnosis not present

## 2014-12-07 DIAGNOSIS — D509 Iron deficiency anemia, unspecified: Secondary | ICD-10-CM | POA: Diagnosis not present

## 2014-12-08 DIAGNOSIS — M542 Cervicalgia: Secondary | ICD-10-CM | POA: Insufficient documentation

## 2014-12-10 LAB — CUP PACEART REMOTE DEVICE CHECK: MDC IDC SESS DTM: 20161120142815

## 2014-12-10 NOTE — Progress Notes (Signed)
Carelink summary report received. Battery status OK. Normal device function. No new symptom episodes, tachy episodes, brady, or pause episodes. No new AF episodes. Monthly summary reports and ROV with [facility] on 12/26/14 at 10:00am. 

## 2014-12-11 ENCOUNTER — Ambulatory Visit (INDEPENDENT_AMBULATORY_CARE_PROVIDER_SITE_OTHER): Payer: Commercial Managed Care - HMO | Admitting: *Deleted

## 2014-12-11 DIAGNOSIS — I639 Cerebral infarction, unspecified: Secondary | ICD-10-CM

## 2014-12-13 NOTE — Progress Notes (Signed)
LOOP RECORDER  

## 2014-12-26 ENCOUNTER — Ambulatory Visit (INDEPENDENT_AMBULATORY_CARE_PROVIDER_SITE_OTHER): Payer: Commercial Managed Care - HMO | Admitting: Cardiovascular Disease

## 2014-12-26 ENCOUNTER — Encounter: Payer: Self-pay | Admitting: Cardiovascular Disease

## 2014-12-26 VITALS — BP 149/81 | HR 88 | Resp 16 | Ht 71.0 in | Wt 214.0 lb

## 2014-12-26 DIAGNOSIS — E782 Mixed hyperlipidemia: Secondary | ICD-10-CM

## 2014-12-26 DIAGNOSIS — I2510 Atherosclerotic heart disease of native coronary artery without angina pectoris: Secondary | ICD-10-CM | POA: Diagnosis not present

## 2014-12-26 DIAGNOSIS — E669 Obesity, unspecified: Secondary | ICD-10-CM

## 2014-12-26 DIAGNOSIS — Z95818 Presence of other cardiac implants and grafts: Secondary | ICD-10-CM

## 2014-12-26 DIAGNOSIS — J449 Chronic obstructive pulmonary disease, unspecified: Secondary | ICD-10-CM

## 2014-12-26 DIAGNOSIS — I639 Cerebral infarction, unspecified: Secondary | ICD-10-CM | POA: Diagnosis not present

## 2014-12-26 DIAGNOSIS — I10 Essential (primary) hypertension: Secondary | ICD-10-CM

## 2014-12-26 NOTE — Progress Notes (Signed)
Patient ID: Louis Gutierrez, male   DOB: Feb 19, 1950, 64 y.o.   MRN: [id number]     Cardiology Office Note   Date:  12/28/2014   ID:  Louis Gutierrez, DOB 09-21-1950, MRN [id number]  PCP:  Kirk Ruths., MD  Cardiologist:   Sanda Klein, MD   Chief Complaint  Patient presents with  . Follow-up    occassional chest pain, frequently has shortness of breath, occassional has edema, has pain in feet, has crampin in toes, no lightheadedness or dizziness      History of Present Illness: Louis Gutierrez is a 64 y.o. male who presents for CAD, cryptogenic stroke, loop recorder, tobacco abuse  Gene has no complaints today. He has not had chest pain or new neurological events. He has chronic mild shortness of breath and a chronic cough. He continues to smoke. Loop recorder interrogation shows no evidence of arrhythmia. He has mild short term memory problems, but otherwise has recovered well from stroke.  He presented with ST segment elevation myocardial infarction in May of 2012. At that time had an inferior wall infarction, manifesting as unstable angina pectoris and eventually a syncopal episode. The right coronary artery was totally occluded and revascularization was difficult but eventually he received a 3.5 x 24 mm Promus drug-eluting stent to the mid RCA. Also noted was 50% ostial stenosis of the first diagonal artery and 40% ostial stenosis of the second diagonal artery branches of the LAD, as well as total occlusion of a small left circumflex system. There was evidence of inferolateral hypokinesis and ejection fraction of 45-50% at that time. He presented with chest [facility] to [facility] in 2015 when he had a repeat coronary angiogram that was reportedly normal. I don't have that report. In May 2015 he presented with right hemiparesis and expressive aphasia with an infarction in the territory of the left middle cerebral artery. An etiology was not identified. He  underwent loop recorder implantation in August 2015 and so far the device has not recorded any arrhythmia. His most recent echo performed in March 2015 showed left ventricular ejection fraction of 50-55 percent with abnormal relaxation and no regional wall motion abnormalities. Both atria were described as normal in size and there were no serious valvular abnormalities.   Past Medical History  Diagnosis Date  . Hypertension 06/10/2010    ECHO at [facility] - see Media tab  . STEMI (ST elevation myocardial infarction) ([place]) 06/07/2010  . Mixed hyperlipidemia   . OSA (obstructive sleep apnea)   . COPD (chronic obstructive pulmonary disease) ([place])   . Tobacco abuse   . CAD (coronary artery disease)   . Obesity   . CVA (cerebral vascular accident) ([place]) 2015    Past Surgical History  Procedure Laterality Date  . Cardiac catheterization  06/07/2010    50 % ostial narrowing in 1st diagonal from LAD, 2nd diagonal had 40% ostial narrowing; AV groove circumflex was diminutive and occluded proximally; RCA totally occluded in mid segment - stented with 3.5 x 32mm Promus Element DES  - See Media tab  . Loop recorder implant  08/16/13    Dr. Sallyanne Kuster  . Cardiac catheterization  05/2013    [place]     Current Outpatient Prescriptions  Medication Sig Dispense Refill  . acetaminophen (TYLENOL) 500 MG tablet Take 500 mg by mouth as needed for pain.    Marland Kitchen albuterol (PROVENTIL HFA;VENTOLIN HFA) 108 (90 BASE) MCG/ACT inhaler Inhale 1 puff into the lungs 2 (two) times daily.    Marland Kitchen  aspirin EC 81 MG tablet Take 81 mg by mouth daily.    . beclomethasone (QVAR) 80 MCG/ACT inhaler Inhale 1 puff into the lungs 2 (two) times daily.    . clopidogrel (PLAVIX) 75 MG tablet Take 1 tablet (75 mg total) by mouth daily. 30 tablet 6  . COMBIVENT RESPIMAT 20-100 MCG/ACT AERS respimat Inhale 1 puff into the lungs 2 (two) times daily. Take 1 puff twice a day    . isosorbide mononitrate (IMDUR) 30 MG 24 hr tablet Take 1 tablet (30 mg  total) by mouth daily. 90 tablet 1  . nitroGLYCERIN (NITROSTAT) 0.4 MG SL tablet Place 1 tablet (0.4 mg total) under the tongue every 5 (five) minutes as needed for chest pain. 25 tablet 3  . PARoxetine (PAXIL) 40 MG tablet Take 40 mg by mouth daily.    . pravastatin (PRAVACHOL) 40 MG tablet Take 1 tablet (40 mg total) by mouth daily. 30 tablet 3  . ramipril (ALTACE) 10 MG capsule Take 1 capsule (10 mg total) by mouth daily. 30 capsule 10  . traZODone (DESYREL) 100 MG tablet Take 100 mg by mouth at bedtime as needed for sleep.     No current facility-administered medications for this visit.    Allergies:   Penicillins    Social History:  The patient  reports that he has been smoking Cigarettes.  He has a 48 pack-year smoking history. He does not have any smokeless tobacco history on file. He reports that he does not drink alcohol or use illicit drugs.   Family History:  The patient's family history includes Cancer in his mother; Coronary artery disease in his brother; Diabetes in his brother; Stroke in his maternal grandmother.    ROS:  Please see the history of present illness.    Otherwise, review of systems positive for none.   All other systems are reviewed and negative.    PHYSICAL EXAM: VS:  BP 149/81 mmHg  Pulse 88  Resp 16  Ht 5\' 11"  (1.803 m)  Wt 214 lb (97.07 kg)  BMI 29.86 kg/m2 , BMI Body mass index is 29.86 kg/(m^2).  General: Alert, oriented x3, no distress Head: no evidence of trauma, PERRL, EOMI, no exophtalmos or lid lag, no myxedema, no xanthelasma; normal ears, nose and oropharynx Neck: normal jugular venous pulsations and no hepatojugular reflux; brisk carotid pulses without delay and no carotid bruits Chest: clear to auscultation, no signs of consolidation by percussion or palpation, normal fremitus, symmetrical and full respiratory excursions, healthy ILR site Cardiovascular: normal position and quality of the apical impulse, regular rhythm, normal first and  second heart sounds, no murmurs, rubs or gallops Abdomen: no tenderness or distention, no masses by palpation, no abnormal pulsatility or arterial bruits, normal bowel sounds, no hepatosplenomegaly Extremities: no clubbing, cyanosis or edema; 2+ radial, ulnar and brachial pulses bilaterally; 2+ right femoral, posterior tibial and dorsalis pedis pulses; 2+ left femoral, posterior tibial and dorsalis pedis pulses; no subclavian or femoral bruits Neurological: grossly nonfocal Psych: euthymic mood, full affect   EKG:  EKG is ordered today. The ekg ordered today demonstrates NSR, Q waves in 2, 3, F, V4-V6, incomplete RBBB,one PVC is seen.   Recent Labs: 08/03/2014: B Natriuretic Peptide 35.0; BUN 19; Creatinine, Ser 1.04; Hemoglobin 12.9*; Platelets 275; Potassium 4.0; Sodium 135    Lipid Panel    Component Value Date/Time   CHOL 110 05/22/2013 0526   CHOL 170 06/22/2012 1020   TRIG 162 05/22/2013 0526   TRIG 172*  06/22/2012 1020   HDL 17* 05/22/2013 0526   HDL 32* 06/22/2012 1020   CHOLHDL 5.3 06/22/2012 1020   VLDL 32 05/22/2013 0526   VLDL 34 06/22/2012 1020   LDLCALC 61 05/22/2013 0526   LDLCALC 104* 06/22/2012 1020      Wt Readings from Last 3 Encounters:  12/26/14 214 lb (97.07 kg)  12/03/14 220 lb (99.791 kg)  08/26/14 208 lb (94.348 kg)     ASSESSMENT AND PLAN:  Stroke of unknown cause Loop recorder has not identified arrhythmia to this point. Continue antiplatelet therapy  CAD (coronary artery disease) Currently asymptomatic. Left ventricular systolic function seems to have returned to normal. He still has very obvious Q waves on EKG but wall motion was described as normal on his echocardiogram.  Mixed hyperlipidemia The biggest abnormality is the low HDL cholesterol for which we do not have good pharmacological treatment. He is encouraged to lose weight and improve his appetite, especially reducing the intake of starches and simple  carbohydrates.  Hypertension Blood pressure control is good at home: he reports consistently 120/80. A little high today, monitor.  Tobacco abuse Again insisted that he try to completely and permanently quit smoking. Reviewed ways to achieve that.    Current medicines are reviewed at length with the patient today.  The patient does not have concerns regarding medicines.  The following changes have been made:  no change  Labs/ tests ordered today include:   Orders Placed This Encounter  Procedures  . EKG 12-Lead    Patient Instructions  Your physician encouraged you to lose weight for better health.  Your physician discussed the hazards of tobacco use. Tobacco use cessation is recommended and techniques and options to help you quit were discussed.   Dr. Sallyanne Kuster recommends that you schedule a follow-up appointment in: ONE YEAR        SignedSanda Klein, MD  12/28/2014 11:00 AM    Sanda Klein, MD, [facility] [phone] office [phone] pager

## 2014-12-26 NOTE — Patient Instructions (Signed)
Your physician encouraged you to lose weight for better health.  Your physician discussed the hazards of tobacco use. Tobacco use cessation is recommended and techniques and options to help you quit were discussed.   Dr. Sallyanne Kuster recommends that you schedule a follow-up appointment in: [place]

## 2014-12-28 ENCOUNTER — Encounter: Payer: Self-pay | Admitting: Cardiovascular Disease

## 2015-01-10 ENCOUNTER — Ambulatory Visit (INDEPENDENT_AMBULATORY_CARE_PROVIDER_SITE_OTHER): Payer: Commercial Managed Care - HMO | Admitting: *Deleted

## 2015-01-10 DIAGNOSIS — I639 Cerebral infarction, unspecified: Secondary | ICD-10-CM

## 2015-01-10 NOTE — Progress Notes (Signed)
Carelink Summary Report / Loop Recorder 

## 2015-01-18 LAB — CUP PACEART INCLINIC DEVICE CHECK: MDC IDC SESS DTM: 20161229110310

## 2015-01-19 ENCOUNTER — Encounter: Payer: Self-pay | Admitting: Cardiovascular Disease

## 2015-01-26 LAB — CUP PACEART REMOTE DEVICE CHECK
Date Time Interrogation Session: 20170106084218
MDC IDC SESS DTM: 20170106083842

## 2015-01-31 ENCOUNTER — Encounter: Payer: Self-pay | Admitting: Cardiology

## 2015-02-08 ENCOUNTER — Ambulatory Visit (INDEPENDENT_AMBULATORY_CARE_PROVIDER_SITE_OTHER): Payer: Commercial Managed Care - HMO | Admitting: *Deleted

## 2015-02-08 DIAGNOSIS — I639 Cerebral infarction, unspecified: Secondary | ICD-10-CM | POA: Diagnosis not present

## 2015-02-08 NOTE — Progress Notes (Signed)
Carelink Summary Report / Loop Recorder 

## 2015-02-09 ENCOUNTER — Encounter: Payer: Self-pay | Admitting: Cardiology

## 2015-02-12 ENCOUNTER — Encounter: Payer: Self-pay | Admitting: Cardiovascular Disease

## 2015-02-12 ENCOUNTER — Telehealth: Payer: Self-pay | Admitting: *Deleted

## 2015-02-12 DIAGNOSIS — D509 Iron deficiency anemia, unspecified: Secondary | ICD-10-CM | POA: Diagnosis not present

## 2015-02-12 NOTE — Telephone Encounter (Signed)
Requesting surgical clearance:   1. Type of surgery: COLONOSCOPY   2. Surgeon: DR. Tornillo  3. Surgical date: Monday 02/26/2015  4. Medications that need to be held: PLAVIX X 5 DAYS

## 2015-02-12 NOTE — Telephone Encounter (Signed)
Letter sent via epic 

## 2015-02-16 ENCOUNTER — Encounter: Payer: Self-pay | Admitting: Cardiology

## 2015-02-21 ENCOUNTER — Encounter: Payer: Self-pay | Admitting: Cardiology

## 2015-02-23 ENCOUNTER — Encounter: Payer: Self-pay | Admitting: *Deleted

## 2015-02-26 ENCOUNTER — Ambulatory Visit: Payer: Commercial Managed Care - HMO | Admitting: Certified Registered Nurse Anesthetist

## 2015-02-26 ENCOUNTER — Ambulatory Visit
Admission: RE | Admit: 2015-02-26 | Discharge: 2015-02-26 | Disposition: A | Discharge status: Home / Self Care | Payer: Commercial Managed Care - HMO | Source: Ambulatory Visit | Attending: Gastroenterology | Admitting: Gastroenterology

## 2015-02-26 ENCOUNTER — Encounter
Admission: RE | Disposition: A | Discharge status: Home / Self Care | Payer: Self-pay | Source: Ambulatory Visit | Attending: Gastroenterology

## 2015-02-26 DIAGNOSIS — E669 Obesity, unspecified: Secondary | ICD-10-CM | POA: Diagnosis not present

## 2015-02-26 DIAGNOSIS — R739 Hyperglycemia, unspecified: Secondary | ICD-10-CM | POA: Insufficient documentation

## 2015-02-26 DIAGNOSIS — Z955 Presence of coronary angioplasty implant and graft: Secondary | ICD-10-CM | POA: Diagnosis not present

## 2015-02-26 DIAGNOSIS — F3289 Other specified depressive episodes: Secondary | ICD-10-CM | POA: Insufficient documentation

## 2015-02-26 DIAGNOSIS — D123 Benign neoplasm of transverse colon: Secondary | ICD-10-CM | POA: Diagnosis not present

## 2015-02-26 DIAGNOSIS — Z79899 Other long term (current) drug therapy: Secondary | ICD-10-CM | POA: Diagnosis not present

## 2015-02-26 DIAGNOSIS — J45909 Unspecified asthma, uncomplicated: Secondary | ICD-10-CM | POA: Diagnosis not present

## 2015-02-26 DIAGNOSIS — Z823 Family history of stroke: Secondary | ICD-10-CM | POA: Insufficient documentation

## 2015-02-26 DIAGNOSIS — F17210 Nicotine dependence, cigarettes, uncomplicated: Secondary | ICD-10-CM | POA: Insufficient documentation

## 2015-02-26 DIAGNOSIS — Z8673 Personal history of transient ischemic attack (TIA), and cerebral infarction without residual deficits: Secondary | ICD-10-CM | POA: Diagnosis not present

## 2015-02-26 DIAGNOSIS — E782 Mixed hyperlipidemia: Secondary | ICD-10-CM | POA: Diagnosis not present

## 2015-02-26 DIAGNOSIS — J449 Chronic obstructive pulmonary disease, unspecified: Secondary | ICD-10-CM | POA: Insufficient documentation

## 2015-02-26 DIAGNOSIS — K2940 Chronic atrophic gastritis without bleeding: Secondary | ICD-10-CM | POA: Insufficient documentation

## 2015-02-26 DIAGNOSIS — M11262 Other chondrocalcinosis, left knee: Secondary | ICD-10-CM | POA: Diagnosis not present

## 2015-02-26 DIAGNOSIS — K640 First degree hemorrhoids: Secondary | ICD-10-CM | POA: Insufficient documentation

## 2015-02-26 DIAGNOSIS — Z825 Family history of asthma and other chronic lower respiratory diseases: Secondary | ICD-10-CM | POA: Diagnosis not present

## 2015-02-26 DIAGNOSIS — Z8249 Family history of ischemic heart disease and other diseases of the circulatory system: Secondary | ICD-10-CM | POA: Diagnosis not present

## 2015-02-26 DIAGNOSIS — I252 Old myocardial infarction: Secondary | ICD-10-CM | POA: Insufficient documentation

## 2015-02-26 DIAGNOSIS — D509 Iron deficiency anemia, unspecified: Secondary | ICD-10-CM | POA: Insufficient documentation

## 2015-02-26 DIAGNOSIS — K3189 Other diseases of stomach and duodenum: Secondary | ICD-10-CM | POA: Diagnosis not present

## 2015-02-26 DIAGNOSIS — I2510 Atherosclerotic heart disease of native coronary artery without angina pectoris: Secondary | ICD-10-CM | POA: Diagnosis not present

## 2015-02-26 DIAGNOSIS — Z7982 Long term (current) use of aspirin: Secondary | ICD-10-CM | POA: Insufficient documentation

## 2015-02-26 DIAGNOSIS — G4733 Obstructive sleep apnea (adult) (pediatric): Secondary | ICD-10-CM | POA: Diagnosis not present

## 2015-02-26 DIAGNOSIS — M11261 Other chondrocalcinosis, right knee: Secondary | ICD-10-CM | POA: Insufficient documentation

## 2015-02-26 DIAGNOSIS — Z87442 Personal history of urinary calculi: Secondary | ICD-10-CM | POA: Diagnosis not present

## 2015-02-26 DIAGNOSIS — Z981 Arthrodesis status: Secondary | ICD-10-CM | POA: Diagnosis not present

## 2015-02-26 DIAGNOSIS — K2970 Gastritis, unspecified, without bleeding: Secondary | ICD-10-CM | POA: Diagnosis not present

## 2015-02-26 DIAGNOSIS — K6389 Other specified diseases of intestine: Secondary | ICD-10-CM | POA: Diagnosis not present

## 2015-02-26 DIAGNOSIS — G4700 Insomnia, unspecified: Secondary | ICD-10-CM | POA: Insufficient documentation

## 2015-02-26 DIAGNOSIS — K5790 Diverticulosis of intestine, part unspecified, without perforation or abscess without bleeding: Secondary | ICD-10-CM | POA: Diagnosis not present

## 2015-02-26 DIAGNOSIS — Z880 Allergy status to penicillin: Secondary | ICD-10-CM | POA: Diagnosis not present

## 2015-02-26 DIAGNOSIS — Z1211 Encounter for screening for malignant neoplasm of colon: Secondary | ICD-10-CM | POA: Diagnosis not present

## 2015-02-26 DIAGNOSIS — K2950 Unspecified chronic gastritis without bleeding: Secondary | ICD-10-CM | POA: Diagnosis not present

## 2015-02-26 DIAGNOSIS — Z6829 Body mass index (BMI) 29.0-29.9, adult: Secondary | ICD-10-CM | POA: Diagnosis not present

## 2015-02-26 DIAGNOSIS — I10 Essential (primary) hypertension: Secondary | ICD-10-CM | POA: Diagnosis not present

## 2015-02-26 DIAGNOSIS — M170 Bilateral primary osteoarthritis of knee: Secondary | ICD-10-CM | POA: Insufficient documentation

## 2015-02-26 HISTORY — DX: Unspecified osteoarthritis, unspecified site: M19.90

## 2015-02-26 HISTORY — DX: Unspecified urinary incontinence: R32

## 2015-02-26 HISTORY — DX: Asymptomatic varicose veins of unspecified lower extremity: I83.90

## 2015-02-26 HISTORY — DX: Depression, unspecified: F32.A

## 2015-02-26 HISTORY — DX: Unspecified asthma, uncomplicated: J45.909

## 2015-02-26 HISTORY — PX: COLONOSCOPY WITH PROPOFOL: SHX5780

## 2015-02-26 HISTORY — DX: Calculus of kidney: N20.0

## 2015-02-26 HISTORY — DX: Personal history of other specified conditions: Z87.898

## 2015-02-26 HISTORY — PX: ESOPHAGOGASTRODUODENOSCOPY: SHX5428

## 2015-02-26 HISTORY — DX: Major depressive disorder, single episode, unspecified: F32.9

## 2015-02-26 SURGERY — COLONOSCOPY WITH PROPOFOL
Anesthesia: General

## 2015-02-26 MED ORDER — LIDOCAINE HCL (CARDIAC) 20 MG/ML IV SOLN
INTRAVENOUS | Status: DC | PRN
  Administered 2015-02-26: 100 mg via INTRAVENOUS

## 2015-02-26 MED ORDER — PROPOFOL 10 MG/ML IV BOLUS
INTRAVENOUS | Status: DC | PRN
  Administered 2015-02-26: 20 mg via INTRAVENOUS
  Administered 2015-02-26: 50 mg via INTRAVENOUS
  Administered 2015-02-26: 20 mg via INTRAVENOUS

## 2015-02-26 MED ORDER — PHENYLEPHRINE HCL 10 MG/ML IJ SOLN
INTRAMUSCULAR | Status: DC | PRN
  Administered 2015-02-26: 50 ug via INTRAVENOUS

## 2015-02-26 MED ORDER — PROPOFOL 500 MG/50ML IV EMUL
INTRAVENOUS | Status: DC | PRN
  Administered 2015-02-26: 120 ug/kg/min via INTRAVENOUS

## 2015-02-26 MED ORDER — SODIUM CHLORIDE 0.9 % IV SOLN
INTRAVENOUS | Status: DC
  Administered 2015-02-26: 14:00:00 via INTRAVENOUS

## 2015-02-26 MED ORDER — SPOT INK MARKER SYRINGE KIT
PACK | SUBMUCOSAL | Status: DC | PRN
  Administered 2015-02-26: 5 mL via SUBMUCOSAL

## 2015-02-26 MED ORDER — FENTANYL CITRATE (PF) 100 MCG/2ML IJ SOLN
INTRAMUSCULAR | Status: DC | PRN
  Administered 2015-02-26: 50 ug via INTRAVENOUS

## 2015-02-26 NOTE — Anesthesia Preprocedure Evaluation (Signed)
Anesthesia Evaluation  Patient identified by MRN, date of birth, ID band Patient awake    Reviewed: Allergy & Precautions, NPO status , Patient's Chart, lab work & pertinent test results  Airway Mallampati: II  TM Distance: >3 FB Neck ROM: Limited   Comment: C-spine fusion--C2-3--for radicular pain. Limited extension and flexion. Dental  (+) Missing, Loose, Poor Dentition, Dental Advisory Given,    Pulmonary asthma , sleep apnea and Continuous Positive Airway Pressure Ventilation , COPD,  COPD inhaler, Current Smoker,  Pack per day for many years.    + decreased breath sounds      Cardiovascular Exercise Tolerance: Poor hypertension, Pt. on medications + CAD and + Past MI   Rhythm:Regular Rate:Normal  Hx of MI in 2012, and a CVA, on Plavix and ASA--held for these procedures.   Neuro/Psych Depression C-spine fusion. CVA, No Residual Symptoms    GI/Hepatic   Endo/Other    Renal/GU Stones.     Musculoskeletal  (+) Arthritis , Osteoarthritis,    Abdominal (+)  Abdomen: soft.    Peds  Hematology   Anesthesia Other Findings   Reproductive/Obstetrics                             Anesthesia Physical Anesthesia Plan  ASA: III  Anesthesia Plan: General   Post-op Pain Management:    Induction: Intravenous  Airway Management Planned: Nasal Cannula  Additional Equipment:   Intra-op Plan:   Post-operative Plan:   Informed Consent: I have reviewed the patients History and Physical, chart, labs and discussed the procedure including the risks, benefits and alternatives for the proposed anesthesia with the patient or authorized representative who has indicated his/her understanding and acceptance.   Dental advisory given  Plan Discussed with: CRNA  Anesthesia Plan Comments:         Anesthesia Quick Evaluation

## 2015-02-26 NOTE — Anesthesia Postprocedure Evaluation (Signed)
Anesthesia Post Note  Patient: Louis Gutierrez  Procedure(s) Performed: Procedure(s) (LRB): COLONOSCOPY WITH PROPOFOL (N/A) ESOPHAGOGASTRODUODENOSCOPY (EGD)  Patient location during evaluation: PACU Anesthesia Type: General Level of consciousness: awake Pain management: pain level controlled Vital Signs Assessment: post-procedure vital signs reviewed and stable Respiratory status: spontaneous breathing Cardiovascular status: blood pressure returned to baseline Postop Assessment: no headache Anesthetic complications: no    Last Vitals:  Filed Vitals:   02/26/15 1358  BP: 165/93  Pulse: 70  Temp: 36.3 C  Resp: 17    Last Pain: There were no vitals filed for this visit.               Kahleb Mcclane M

## 2015-02-26 NOTE — H&P (Signed)
Primary Care Physician:  Kirk Ruths., MD  Pre-Procedure History & Physical: HPI:  Louis Gutierrez is a 65 y.o. male is here for an endoscopy/colonoscopy   Past Medical History  Diagnosis Date  . Hypertension 06/10/2010    ECHO at [facility] - see Media tab  . STEMI (ST elevation myocardial infarction) ([place]) 06/07/2010  . Mixed hyperlipidemia   . OSA (obstructive sleep apnea)   . COPD (chronic obstructive pulmonary disease) ([place])   . Tobacco abuse   . CAD (coronary artery disease)   . Obesity   . CVA (cerebral vascular accident) ([place]) 2015  . Asthma without status asthmaticus   . Cerebral vascular accident ([place])   . Depression   . H/O insomnia   . Nephrolithiasis   . Osteoarthritis   . Urinary incontinence   . Varicose veins     Past Surgical History  Procedure Laterality Date  . Cardiac catheterization  06/07/2010    50 % ostial narrowing in 1st diagonal from LAD, 2nd diagonal had 40% ostial narrowing; AV groove circumflex was diminutive and occluded proximally; RCA totally occluded in mid segment - stented with 3.5 x 25mm Promus Element DES  - See Media tab  . Loop recorder implant  08/16/13    Dr. Sallyanne Kuster  . Cardiac catheterization  05/2013    [place]    Prior to Admission medications   Medication Sig Start Date End Date Taking? Authorizing Provider  acetaminophen (TYLENOL) 500 MG tablet Take 500 mg by mouth as needed for pain.   Yes Historical Provider, MD  beclomethasone (QVAR) 80 MCG/ACT inhaler Inhale 1 puff into the lungs 2 (two) times daily.   Yes Historical Provider, MD  PARoxetine (PAXIL) 40 MG tablet Take 40 mg by mouth daily. 11/16/13  Yes Historical Provider, MD  pravastatin (PRAVACHOL) 40 MG tablet Take 1 tablet (40 mg total) by mouth daily. 09/26/14  Yes Mihai Croitoru, MD  ramipril (ALTACE) 10 MG capsule Take 1 capsule (10 mg total) by mouth daily. 02/23/14  Yes Mihai Croitoru, MD  traMADol (ULTRAM) 50 MG tablet Take by mouth every 6 (six) hours as needed.    Yes Historical Provider, MD  albuterol (PROVENTIL HFA;VENTOLIN HFA) 108 (90 BASE) MCG/ACT inhaler Inhale 1 puff into the lungs 2 (two) times daily.    Historical Provider, MD  aspirin EC 81 MG tablet Take 81 mg by mouth daily.    Historical Provider, MD  clopidogrel (PLAVIX) 75 MG tablet Take 1 tablet (75 mg total) by mouth daily. 08/08/14   Mihai Croitoru, MD  COMBIVENT RESPIMAT 20-100 MCG/ACT AERS respimat Inhale 1 puff into the lungs 2 (two) times daily. Take 1 puff twice a day 11/29/14   Historical Provider, MD  isosorbide mononitrate (IMDUR) 30 MG 24 hr tablet Take 1 tablet (30 mg total) by mouth daily. 11/08/14   Mihai Croitoru, MD  nitroGLYCERIN (NITROSTAT) 0.4 MG SL tablet Place 1 tablet (0.4 mg total) under the tongue every 5 (five) minutes as needed for chest pain. 04/18/14   Mihai Croitoru, MD  traZODone (DESYREL) 100 MG tablet Take 100 mg by mouth at bedtime as needed for sleep.    Historical Provider, MD    Allergies as of 02/21/2015 - Review Complete 12/28/2014  Allergen Reaction Noted  . Penicillins Hives 06/20/2012    Family History  Problem Relation Age of Onset  . Cancer Mother   . Stroke Maternal Grandmother   . Diabetes Brother   . Coronary artery disease Brother  Social History   Social History  . Marital Status: Married    Spouse Name: N/A  . Number of Children: N/A  . Years of Education: N/A   Occupational History  . Not on file.   Social History Main Topics  . Smoking status: Current Every Day Smoker -- 1.00 packs/day for 48 years    Types: Cigarettes  . Smokeless tobacco: Not on file  . Alcohol Use: No  . Drug Use: No  . Sexual Activity: Not on file   Other Topics Concern  . Not on file   Social History Narrative     Physical Exam: BP 165/93 mmHg  Pulse 70  Temp(Src) 97.4 F (36.3 C) (Tympanic)  Resp 17  Ht 5\' 11"  (1.803 m)  Wt 97.07 kg (214 lb)  BMI 29.86 kg/m2  SpO2 98% General:   Alert,  pleasant and cooperative in NAD Head:   Normocephalic and atraumatic. Neck:  Supple; no masses or thyromegaly. Lungs:  Clear throughout to auscultation.    Heart:  Regular rate and rhythm. Abdomen:  Soft, nontender and nondistended. Normal bowel sounds, without guarding, and without rebound.   Neurologic:  Alert and  oriented x4;  grossly normal neurologically.  Impression/Plan: Prosper Prinkey is here for an endoscopy/coolonoscopy to be performed for IDA  Risks, benefits, limitations, and alternatives regarding  Endoscopy/colonoscopy have been reviewed with the patient.  Questions have been answered.  All parties agreeable.   Josefine Class, MD  02/26/2015, 2:27 PM

## 2015-02-26 NOTE — Transfer of Care (Signed)
Immediate Anesthesia Transfer of Care Note  Patient: Louis Gutierrez  Procedure(s) Performed: Procedure(s): COLONOSCOPY WITH PROPOFOL (N/A) ESOPHAGOGASTRODUODENOSCOPY (EGD)  Patient Location: PACU  Anesthesia Type:General  Level of Consciousness: sedated  Airway & Oxygen Therapy: Patient Spontanous Breathing and Patient connected to nasal cannula oxygen  Post-op Assessment: Report given to RN and Post -op Vital signs reviewed and stable  Post vital signs: Reviewed and stable  Last Vitals:  Filed Vitals:   02/26/15 1358  BP: 165/93  Pulse: 70  Temp: 36.3 C  Resp: 17    Complications: No complications

## 2015-02-26 NOTE — Discharge Instructions (Addendum)
Restart Plavix tomorrow.  YOU HAD AN ENDOSCOPIC PROCEDURE TODAY: Refer to the procedure report that was given to you for any specific questions about what was found during the examination.  If the procedure report does not answer your questions, please call your gastroenterologist to clarify.  YOU SHOULD EXPECT: Some feelings of bloating in the abdomen. Passage of more gas than usual.  Walking can help get rid of the air that was put into your GI tract during the procedure and reduce the bloating. If you had a lower endoscopy (such as a colonoscopy or flexible sigmoidoscopy) you may notice spotting of blood in your stool or on the toilet paper.   DIET: Your first meal following the procedure should be a light meal and then it is ok to progress to your normal diet.  A half-sandwich or bowl of soup is an example of a good first meal.  Heavy or fried foods are harder to digest and may make you feel nasueas or bloated.  Drink plenty of fluids but you should avoid alcoholic beverages for 24 hours.  ACTIVITY: Your care partner should take you home directly after the procedure.  You should plan to take it easy, moving slowly for the rest of the day.  You can resume normal activity the day after the procedure however you should NOT DRIVE or use heavy machinery for 24 hours (because of the sedation medicines used during the test).    SYMPTOMS TO REPORT IMMEDIATELY  A gastroenterologist can be reached at any hour.  Please call your doctor's office for any of the following symptoms:   Following lower endoscopy (colonoscopy, flexible sigmoidoscopy)  Excessive amounts of blood in the stool  Significant tenderness, worsening of abdominal pains  Swelling of the abdomen that is new, acute  Fever of 100 or higher  Following upper endoscopy (EGD, EUS, ERCP)  Vomiting of blood or coffee ground material  New, significant abdominal pain  New, significant chest pain or pain under the shoulder blades  Painful or  persistently difficult swallowing  New shortness of breath  Black, tarry-looking stools  FOLLOW UP: If any biopsies were taken you will be contacted by phone or by letter within the next 1-3 weeks.  Call your gastroenterologist if you have not heard about the biopsies in 3 weeks.  Please also call your gastroenterologist's office with any specific questions about appointments or follow up tests.

## 2015-02-26 NOTE — Op Note (Signed)
[facility] Gastroenterology Patient Name: Louis Gutierrez Procedure Date: 02/26/2015 2:12 PM MRN: [id number] Account #: [id number] Date of Birth: Mar 20, 1950 Admit Type: Outpatient Age: 65 Room: [facility] ENDO ROOM 2 Gender: Male Note Status: Finalized Procedure:         Colonoscopy Indications:       This is the patient's first colonoscopy, Iron deficiency                     anemia Patient Profile:   This is a 65 year old male. Providers:         Gerrit Heck. Rayann Heman, MD Referring MD:      Ocie Cornfield. Ouida Sills, MD (Referring MD) Medicines:         Propofol per Anesthesia Complications:     No immediate complications. Procedure:         Pre-Anesthesia Assessment:                    - Prior to the procedure, a History and Physical was                     performed, and patient medications, allergies and                     sensitivities were reviewed. The patient's tolerance of                     previous anesthesia was reviewed.                    After obtaining informed consent, the colonoscope was                     passed under direct vision. Throughout the procedure, the                     patient's blood pressure, pulse, and oxygen saturations                     were monitored continuously. The Colonoscope was                     introduced through the anus and advanced to the the cecum,                     identified by appendiceal orifice and ileocecal valve. The                     colonoscopy was unusually difficult due to significant                     looping. Successful completion of the procedure was aided                     by using manual pressure. The quality of the bowel                     preparation was poor. Findings:      The perianal and digital rectal examinations were normal.      A 25 mm polypoid lesion was found at the hepatic flexure. The lesion was       sessile. No bleeding was present. Biopsies were taken with a cold       forceps for  histology. Area was tattooed with an injection of 5 mL of  [place] ink just distal to the lesion in 4 quadrants.      Internal hemorrhoids were found during retroflexion. The hemorrhoids       were Grade I (internal hemorrhoids that do not prolapse). Impression:        - Preparation of the colon was poor.                    - Rule out malignancy, polypoid lesion at the hepatic                     flexure. Biopsied. Tattooed just distal to the lesion in 4                     quadrantts.                    - Internal hemorrhoids. Recommendation:    - The findings and recommendations were discussed with the                     patient.                    - The findings and recommendations were discussed with the                     patient's family.                    - Await pathology results.                    - Referral to advanced endoscopist for removal of 2.5 cm                     polyp in HF and clearance of colon. Needs 2 day prep, poor                     prep today. Procedure Code(s): --- Professional ---                    [phone], Colonoscopy, flexible; with biopsy, single or                     multiple                    45381, Colonoscopy, flexible; with directed submucosal                     injection(s), any substance Diagnosis Code(s): --- Professional ---                    K64.0, First degree hemorrhoids                    D49.0, Neoplasm of unspecified behavior of digestive system                    D50.9, Iron deficiency anemia, unspecified CPT copyright 2014 American Medical Association. All rights reserved. The codes documented in this report are preliminary and upon coder review may  be revised to meet current compliance requirements. Mellody Life, MD 02/26/2015 3:14:29 PM This report has been signed electronically. Number of Addenda: 0 Note Initiated On: 02/26/2015 2:12 PM Scope Withdrawal Time: 0 hours 4 minutes 20 seconds  Total Procedure Duration: 0  hours 23 minutes 34 seconds  [facility]

## 2015-02-26 NOTE — Anesthesia Procedure Notes (Signed)
Performed by: Yahir Tavano Pre-anesthesia Checklist: Patient identified, Emergency Drugs available, Suction available, Patient being monitored and Timeout performed Patient Re-evaluated:Patient Re-evaluated prior to inductionOxygen Delivery Method: Nasal cannula Intubation Type: IV induction       

## 2015-02-26 NOTE — Op Note (Signed)
[facility] Gastroenterology Patient Name: Louis Gutierrez Procedure Date: 02/26/2015 2:29 PM MRN: [id number] Account #: [id number] Date of Birth: 1950/07/19 Admit Type: Outpatient Age: 65 Room: [facility] ENDO ROOM 2 Gender: Male Note Status: Finalized Procedure:         Upper GI endoscopy Indications:       Iron deficiency anemia Patient Profile:   This is a 65 year old male. Providers:         Gerrit Heck. Rayann Heman, MD Referring MD:      Ocie Cornfield. Ouida Sills, MD (Referring MD) Medicines:         Propofol per Anesthesia Complications:     No immediate complications. Procedure:         Pre-Anesthesia Assessment:                    - Prior to the procedure, a History and Physical was                     performed, and patient medications, allergies and                     sensitivities were reviewed. The patient's tolerance of                     previous anesthesia was reviewed.                    After obtaining informed consent, the endoscope was passed                     under direct vision. Throughout the procedure, the                     patient's blood pressure, pulse, and oxygen saturations                     were monitored continuously. The Endoscope was introduced                     through the mouth, and advanced to the second part of                     duodenum. The upper GI endoscopy was accomplished without                     difficulty. The patient tolerated the procedure well. Findings:      The esophagus was normal.      Scattered moderate inflammation characterized by erosions and       granularity was found in the gastric body. Biopsies were taken with a       cold forceps for histology.      The examined duodenum was normal.      Four biopsies were obtained with cold forceps for histology randomly in       the duodenal bulb and in the 2nd part of the duodenum. Impression:        - Normal esophagus.                    - Gastritis. Biopsied.            - Normal examined duodenum.                    - Four biopsies were obtained in the duodenal bulb and in  the 2nd part of the duodenum. Recommendation:    - Perform a colonoscopy today.                    - Continue present medications.                    - Await pathology results.                    - No aspirin, ibuprofen, naproxen, or other non-steroidal                     anti-inflammatory drugs.                    - The findings and recommendations were discussed with the                     patient.                    - The findings and recommendations were discussed with the                     patient's family. Procedure Code(s): --- Professional ---                    [phone], Esophagogastroduodenoscopy, flexible, transoral;                     with biopsy, single or multiple Diagnosis Code(s): --- Professional ---                    K29.70, Gastritis, unspecified, without bleeding                    D50.9, Iron deficiency anemia, unspecified CPT copyright 2014 American Medical Association. All rights reserved. The codes documented in this report are preliminary and upon coder review may  be revised to meet current compliance requirements. Mellody Life, MD 02/26/2015 2:42:47 PM This report has been signed electronically. Number of Addenda: 0 Note Initiated On: 02/26/2015 2:29 PM      [facility]

## 2015-02-27 ENCOUNTER — Encounter: Payer: Self-pay | Admitting: Gastroenterology

## 2015-03-01 LAB — SURGICAL PATHOLOGY

## 2015-03-05 ENCOUNTER — Other Ambulatory Visit: Payer: Self-pay | Admitting: Cardiovascular Disease

## 2015-03-08 ENCOUNTER — Ambulatory Visit: Payer: Commercial Managed Care - HMO

## 2015-03-12 ENCOUNTER — Ambulatory Visit
Admission: RE | Admit: 2015-03-12 | Discharge: 2015-03-12 | Disposition: A | Discharge status: Home / Self Care | Payer: Commercial Managed Care - HMO | Source: Ambulatory Visit | Attending: Internal Medicine | Admitting: Internal Medicine

## 2015-03-12 ENCOUNTER — Ambulatory Visit (INDEPENDENT_AMBULATORY_CARE_PROVIDER_SITE_OTHER): Payer: Commercial Managed Care - HMO | Admitting: *Deleted

## 2015-03-12 DIAGNOSIS — C189 Malignant neoplasm of colon, unspecified: Secondary | ICD-10-CM | POA: Diagnosis not present

## 2015-03-12 DIAGNOSIS — I639 Cerebral infarction, unspecified: Secondary | ICD-10-CM

## 2015-03-12 DIAGNOSIS — N281 Cyst of kidney, acquired: Secondary | ICD-10-CM | POA: Insufficient documentation

## 2015-03-12 DIAGNOSIS — D3502 Benign neoplasm of left adrenal gland: Secondary | ICD-10-CM | POA: Insufficient documentation

## 2015-03-12 DIAGNOSIS — M545 Low back pain: Secondary | ICD-10-CM | POA: Diagnosis not present

## 2015-03-12 DIAGNOSIS — N289 Disorder of kidney and ureter, unspecified: Secondary | ICD-10-CM

## 2015-03-12 LAB — POCT I-STAT CREATININE: Creatinine, Ser: 1 mg/dL (ref 0.61–1.24)

## 2015-03-12 MED ORDER — GADOBENATE DIMEGLUMINE 529 MG/ML IV SOLN
20.0000 mL | Freq: Once | INTRAVENOUS | Status: AC | PRN
  Administered 2015-03-12: 19 mL via INTRAVENOUS

## 2015-03-14 NOTE — Progress Notes (Signed)
Carelink Summary Report / Loop Recorder 

## 2015-03-19 ENCOUNTER — Encounter: Payer: Self-pay | Admitting: Urology

## 2015-03-19 ENCOUNTER — Ambulatory Visit (INDEPENDENT_AMBULATORY_CARE_PROVIDER_SITE_OTHER): Payer: Commercial Managed Care - HMO | Admitting: Urology

## 2015-03-19 VITALS — BP 108/69 | HR 75 | Ht 71.0 in | Wt 211.5 lb

## 2015-03-19 DIAGNOSIS — D4102 Neoplasm of uncertain behavior of left kidney: Secondary | ICD-10-CM | POA: Insufficient documentation

## 2015-03-19 NOTE — Progress Notes (Signed)
Consult: Left renal neoplasm Referred by: Dr. Ouida Sills, GI Dr. Rayann Heman  History of present illness: Patient underwent MRI the abdomen August 2016 showed multiple bilateral Bosnian 1 into system which were hemorrhagic. There was a 12 mm left upper pole partially exophytic enhancing mass. Patient underwent a repeat MRI February 2017 which showed stable bilateral Bosnian 1 into system is stable 12 mm left upper pole partially enhancing mass. I reviewed the images above studies.  Patient has a history of kidney stones but he said no recent flank pain or gross hematuria. He has some occasional urgency.  I reviewed his past medical and surgical history. This was significant for cardiac stenting in 2012 which requires anticoagulants. I also reviewed his family history, social history, allergies, medications.  13 system review of systems was obtained and was negative apart from the following: Fatigue, easy bruising, cough, shortness of breath, joint pain, depression  Physical exam: Patient in no acute distress, no obvious neurologic deficits.  Assessment/plan: Bilateral Bosniak 1 and 2 cysts, 12 mm left upper pole solid enhancing neoplasm-I drew patient a picture of the anatomy. I drew him a picture of the Bosniak scale and compared to a solid renal lesion. We discussed the nature of cystic and solid renal neoplasms including the risk of being benign versus malignant. We discussed the nature risk and benefits of continued surveillance (mass growth, more difficult to treat in future, metastasis, etc), biopsy, ablation and partial nephrectomy. We discussed in the literature even larger enhancing solid renal masses tend to grow slowly and rarely metastasize. In contrast, his mass has been stable and not changed over 6 months. It is on the smaller size to get an accurate biopsy. All questions answered. He elected to continue surveillance. We'll plan to see him back in another 6 months with an MRI of the kidneys  prior.

## 2015-03-20 ENCOUNTER — Other Ambulatory Visit: Payer: Self-pay

## 2015-03-20 DIAGNOSIS — D4102 Neoplasm of uncertain behavior of left kidney: Secondary | ICD-10-CM

## 2015-03-21 ENCOUNTER — Telehealth: Payer: Self-pay | Admitting: Cardiovascular Disease

## 2015-03-21 DIAGNOSIS — D123 Benign neoplasm of transverse colon: Secondary | ICD-10-CM | POA: Diagnosis not present

## 2015-03-21 DIAGNOSIS — D508 Other iron deficiency anemias: Secondary | ICD-10-CM | POA: Diagnosis not present

## 2015-03-21 NOTE — Telephone Encounter (Signed)
Request for surgical clearance:  1. What type of surgery is being performed? Colonoscopy ,very large polyp that will require Endoscopic Mucosal Resection   2. When is this surgery scheduled? Not yet scheduled   3. Are there any medications that need to be held prior to surgery and how long?Can pt hold Plavix 3 days before and 3 days after procedure  4. Name of physician performing surgery? Dr Ivor Messier   5. What is your office phone and fax number? [phone] Att: Corliss Marcus  6.

## 2015-03-22 ENCOUNTER — Telehealth: Payer: Self-pay | Admitting: *Deleted

## 2015-03-22 ENCOUNTER — Encounter: Payer: Self-pay | Admitting: Cardiovascular Disease

## 2015-03-22 NOTE — Telephone Encounter (Signed)
Sent via epic 

## 2015-03-22 NOTE — Telephone Encounter (Signed)
Returned call to [place], confirmed fax received and instructions acknowledged.

## 2015-03-22 NOTE — Telephone Encounter (Signed)
Requesting surgical clearance:   1. Type of surgery: ENDOSCOPY MUCOSAL RESECTION  2. Surgeon: DR. IAN GRIMM  3. Surgical date: PENDING  4. Medications that need to be held: AGREEABLE TO PERFORM WHILE ON ASA 81 MG/DAY AS LONG AS HE CAN HOLD PLAVIX X 3 DAYS PRIOR AND 3 DAYS FOLLOWING

## 2015-03-22 NOTE — Telephone Encounter (Signed)
I already sent letter through [place]. Can even stop plavix for 5-7 days before procedure.

## 2015-04-09 ENCOUNTER — Ambulatory Visit (INDEPENDENT_AMBULATORY_CARE_PROVIDER_SITE_OTHER): Payer: Commercial Managed Care - HMO | Admitting: *Deleted

## 2015-04-09 DIAGNOSIS — I639 Cerebral infarction, unspecified: Secondary | ICD-10-CM

## 2015-04-10 NOTE — Progress Notes (Signed)
Carelink Summary Report / Loop Recorder 

## 2015-04-12 ENCOUNTER — Other Ambulatory Visit: Payer: Self-pay | Admitting: Cardiovascular Disease

## 2015-04-12 NOTE — Telephone Encounter (Signed)
Rx(s) sent to pharmacy electronically.  

## 2015-04-16 ENCOUNTER — Telehealth: Payer: Self-pay | Admitting: Cardiology

## 2015-04-16 NOTE — Telephone Encounter (Signed)
LMOVM requesting that pt send a manual transmission w/ home monitor b/c it has not updated in at least 14 days.

## 2015-04-19 ENCOUNTER — Other Ambulatory Visit: Payer: Self-pay | Admitting: Cardiovascular Disease

## 2015-04-19 NOTE — Telephone Encounter (Signed)
REFILL 

## 2015-05-08 DIAGNOSIS — K635 Polyp of colon: Secondary | ICD-10-CM | POA: Diagnosis not present

## 2015-05-08 DIAGNOSIS — D369 Benign neoplasm, unspecified site: Secondary | ICD-10-CM | POA: Diagnosis not present

## 2015-05-08 DIAGNOSIS — Z8673 Personal history of transient ischemic attack (TIA), and cerebral infarction without residual deficits: Secondary | ICD-10-CM | POA: Diagnosis not present

## 2015-05-08 DIAGNOSIS — D123 Benign neoplasm of transverse colon: Secondary | ICD-10-CM | POA: Diagnosis not present

## 2015-05-08 DIAGNOSIS — E785 Hyperlipidemia, unspecified: Secondary | ICD-10-CM | POA: Diagnosis not present

## 2015-05-08 DIAGNOSIS — F17210 Nicotine dependence, cigarettes, uncomplicated: Secondary | ICD-10-CM | POA: Diagnosis not present

## 2015-05-08 DIAGNOSIS — D120 Benign neoplasm of cecum: Secondary | ICD-10-CM | POA: Diagnosis not present

## 2015-05-08 DIAGNOSIS — D122 Benign neoplasm of ascending colon: Secondary | ICD-10-CM | POA: Diagnosis not present

## 2015-05-08 DIAGNOSIS — K5730 Diverticulosis of large intestine without perforation or abscess without bleeding: Secondary | ICD-10-CM | POA: Diagnosis not present

## 2015-05-08 DIAGNOSIS — I2510 Atherosclerotic heart disease of native coronary artery without angina pectoris: Secondary | ICD-10-CM | POA: Diagnosis not present

## 2015-05-08 DIAGNOSIS — M1990 Unspecified osteoarthritis, unspecified site: Secondary | ICD-10-CM | POA: Diagnosis not present

## 2015-05-08 DIAGNOSIS — I10 Essential (primary) hypertension: Secondary | ICD-10-CM | POA: Diagnosis not present

## 2015-05-08 DIAGNOSIS — J449 Chronic obstructive pulmonary disease, unspecified: Secondary | ICD-10-CM | POA: Diagnosis not present

## 2015-05-09 ENCOUNTER — Ambulatory Visit (INDEPENDENT_AMBULATORY_CARE_PROVIDER_SITE_OTHER): Payer: Commercial Managed Care - HMO | Admitting: *Deleted

## 2015-05-09 DIAGNOSIS — I639 Cerebral infarction, unspecified: Secondary | ICD-10-CM | POA: Diagnosis not present

## 2015-05-09 NOTE — Progress Notes (Signed)
Carelink Summary Report / Loop Recorder 

## 2015-05-24 DIAGNOSIS — J209 Acute bronchitis, unspecified: Secondary | ICD-10-CM | POA: Diagnosis not present

## 2015-05-24 DIAGNOSIS — J440 Chronic obstructive pulmonary disease with acute lower respiratory infection: Secondary | ICD-10-CM | POA: Diagnosis not present

## 2015-05-25 LAB — CUP PACEART REMOTE DEVICE CHECK: Date Time Interrogation Session: 20170505143949

## 2015-05-30 ENCOUNTER — Other Ambulatory Visit: Payer: Self-pay | Admitting: Cardiovascular Disease

## 2015-05-30 DIAGNOSIS — R739 Hyperglycemia, unspecified: Secondary | ICD-10-CM | POA: Diagnosis not present

## 2015-05-30 DIAGNOSIS — D509 Iron deficiency anemia, unspecified: Secondary | ICD-10-CM | POA: Diagnosis not present

## 2015-05-30 DIAGNOSIS — I25119 Atherosclerotic heart disease of native coronary artery with unspecified angina pectoris: Secondary | ICD-10-CM | POA: Diagnosis not present

## 2015-05-30 DIAGNOSIS — I10 Essential (primary) hypertension: Secondary | ICD-10-CM | POA: Diagnosis not present

## 2015-05-30 NOTE — Telephone Encounter (Signed)
Rx has been sent to the pharmacy electronically. ° °

## 2015-06-06 DIAGNOSIS — I25119 Atherosclerotic heart disease of native coronary artery with unspecified angina pectoris: Secondary | ICD-10-CM | POA: Diagnosis not present

## 2015-06-06 DIAGNOSIS — R739 Hyperglycemia, unspecified: Secondary | ICD-10-CM | POA: Diagnosis not present

## 2015-06-06 DIAGNOSIS — I10 Essential (primary) hypertension: Secondary | ICD-10-CM | POA: Diagnosis not present

## 2015-06-06 DIAGNOSIS — F325 Major depressive disorder, single episode, in full remission: Secondary | ICD-10-CM | POA: Diagnosis not present

## 2015-06-06 DIAGNOSIS — I672 Cerebral atherosclerosis: Secondary | ICD-10-CM | POA: Diagnosis not present

## 2015-06-06 DIAGNOSIS — J449 Chronic obstructive pulmonary disease, unspecified: Secondary | ICD-10-CM | POA: Diagnosis not present

## 2015-06-08 ENCOUNTER — Ambulatory Visit (INDEPENDENT_AMBULATORY_CARE_PROVIDER_SITE_OTHER): Payer: Commercial Managed Care - HMO | Admitting: *Deleted

## 2015-06-08 DIAGNOSIS — I639 Cerebral infarction, unspecified: Secondary | ICD-10-CM

## 2015-06-08 NOTE — Progress Notes (Signed)
Carelink Summary Report / Loop Recorder 

## 2015-06-15 LAB — CUP PACEART REMOTE DEVICE CHECK: MDC IDC SESS DTM: 20170526161514

## 2015-06-18 LAB — CUP PACEART REMOTE DEVICE CHECK: MDC IDC SESS DTM: 20170529055534

## 2015-06-18 NOTE — Progress Notes (Signed)
Carelink summary report received. Battery status OK. Normal device function. No new symptom episodes, tachy episodes, brady, or pause episodes. No new AF episodes. Monthly summary reports and ROV/PRN 

## 2015-06-27 ENCOUNTER — Other Ambulatory Visit: Payer: Self-pay | Admitting: Cardiovascular Disease

## 2015-06-27 NOTE — Telephone Encounter (Signed)
Rx(s) sent to pharmacy electronically.  

## 2015-07-09 ENCOUNTER — Ambulatory Visit (INDEPENDENT_AMBULATORY_CARE_PROVIDER_SITE_OTHER): Payer: Commercial Managed Care - HMO | Admitting: *Deleted

## 2015-07-09 DIAGNOSIS — I639 Cerebral infarction, unspecified: Secondary | ICD-10-CM | POA: Diagnosis not present

## 2015-07-09 NOTE — Progress Notes (Signed)
Carelink Summary Report / Loop Recorder 

## 2015-07-18 LAB — CUP PACEART REMOTE DEVICE CHECK
Date Time Interrogation Session: 20170628101853
Date Time Interrogation Session: 20170628102028

## 2015-08-07 ENCOUNTER — Ambulatory Visit (INDEPENDENT_AMBULATORY_CARE_PROVIDER_SITE_OTHER): Payer: Commercial Managed Care - HMO | Admitting: *Deleted

## 2015-08-07 DIAGNOSIS — I639 Cerebral infarction, unspecified: Secondary | ICD-10-CM | POA: Diagnosis not present

## 2015-08-07 NOTE — Progress Notes (Signed)
Carelink Summary Report / Loop Recorder 

## 2015-08-08 DIAGNOSIS — M1120 Other chondrocalcinosis, unspecified site: Secondary | ICD-10-CM | POA: Diagnosis not present

## 2015-08-08 DIAGNOSIS — M170 Bilateral primary osteoarthritis of knee: Secondary | ICD-10-CM | POA: Diagnosis not present

## 2015-08-08 DIAGNOSIS — M25562 Pain in left knee: Secondary | ICD-10-CM | POA: Diagnosis not present

## 2015-08-08 DIAGNOSIS — M25561 Pain in right knee: Secondary | ICD-10-CM | POA: Diagnosis not present

## 2015-08-30 DIAGNOSIS — R739 Hyperglycemia, unspecified: Secondary | ICD-10-CM | POA: Diagnosis not present

## 2015-08-30 DIAGNOSIS — I672 Cerebral atherosclerosis: Secondary | ICD-10-CM | POA: Diagnosis not present

## 2015-08-30 DIAGNOSIS — I10 Essential (primary) hypertension: Secondary | ICD-10-CM | POA: Diagnosis not present

## 2015-09-03 ENCOUNTER — Ambulatory Visit: Payer: Commercial Managed Care - HMO

## 2015-09-06 ENCOUNTER — Ambulatory Visit (INDEPENDENT_AMBULATORY_CARE_PROVIDER_SITE_OTHER): Payer: Commercial Managed Care - HMO | Admitting: *Deleted

## 2015-09-06 DIAGNOSIS — I10 Essential (primary) hypertension: Secondary | ICD-10-CM | POA: Diagnosis not present

## 2015-09-06 DIAGNOSIS — J449 Chronic obstructive pulmonary disease, unspecified: Secondary | ICD-10-CM | POA: Diagnosis not present

## 2015-09-06 DIAGNOSIS — I639 Cerebral infarction, unspecified: Secondary | ICD-10-CM | POA: Diagnosis not present

## 2015-09-06 DIAGNOSIS — I25119 Atherosclerotic heart disease of native coronary artery with unspecified angina pectoris: Secondary | ICD-10-CM | POA: Diagnosis not present

## 2015-09-06 DIAGNOSIS — E119 Type 2 diabetes mellitus without complications: Secondary | ICD-10-CM | POA: Diagnosis not present

## 2015-09-06 DIAGNOSIS — F325 Major depressive disorder, single episode, in full remission: Secondary | ICD-10-CM | POA: Diagnosis not present

## 2015-09-06 NOTE — Progress Notes (Signed)
Carelink Summary Report / Loop Recorder 

## 2015-09-11 ENCOUNTER — Other Ambulatory Visit: Payer: Self-pay

## 2015-09-11 ENCOUNTER — Ambulatory Visit
Admission: RE | Admit: 2015-09-11 | Discharge: 2015-09-11 | Disposition: A | Discharge status: Home / Self Care | Payer: Commercial Managed Care - HMO | Source: Ambulatory Visit | Attending: Urology | Admitting: Urology

## 2015-09-11 DIAGNOSIS — D3502 Benign neoplasm of left adrenal gland: Secondary | ICD-10-CM | POA: Insufficient documentation

## 2015-09-11 DIAGNOSIS — D4102 Neoplasm of uncertain behavior of left kidney: Secondary | ICD-10-CM | POA: Diagnosis not present

## 2015-09-11 DIAGNOSIS — K760 Fatty (change of) liver, not elsewhere classified: Secondary | ICD-10-CM | POA: Insufficient documentation

## 2015-09-11 DIAGNOSIS — K8020 Calculus of gallbladder without cholecystitis without obstruction: Secondary | ICD-10-CM | POA: Diagnosis not present

## 2015-09-11 DIAGNOSIS — N2889 Other specified disorders of kidney and ureter: Secondary | ICD-10-CM | POA: Diagnosis not present

## 2015-09-11 DIAGNOSIS — D49519 Neoplasm of unspecified behavior of unspecified kidney: Secondary | ICD-10-CM

## 2015-09-11 MED ORDER — GADOBENATE DIMEGLUMINE 529 MG/ML IV SOLN: 20.0000 mL | Freq: Once | INTRAVENOUS | Status: DC | PRN

## 2015-09-11 NOTE — Progress Notes (Signed)
Wrong order was placed by Dr. Junious Silk. New order placed and signed by Larene Beach.

## 2015-09-18 ENCOUNTER — Telehealth: Payer: Self-pay

## 2015-09-18 NOTE — Telephone Encounter (Signed)
LMOM

## 2015-09-18 NOTE — Telephone Encounter (Signed)
-----   Message from Festus Aloe, MD sent at 09/18/2015 11:55 AM EDT ----- Notify patient MRI looks stable - no change in cysts or mass of kidneys. F/u to review.   ----- Message ----- From: Nori Riis, PA-C Sent: 09/11/2015   1:11 PM To: Festus Aloe, MD  I think this is your patient.

## 2015-09-19 ENCOUNTER — Ambulatory Visit (INDEPENDENT_AMBULATORY_CARE_PROVIDER_SITE_OTHER): Payer: Commercial Managed Care - HMO | Admitting: Urology

## 2015-09-19 VITALS — BP 99/65 | HR 80 | Ht 71.0 in | Wt 220.9 lb

## 2015-09-19 DIAGNOSIS — R3129 Other microscopic hematuria: Secondary | ICD-10-CM | POA: Diagnosis not present

## 2015-09-19 DIAGNOSIS — N2889 Other specified disorders of kidney and ureter: Secondary | ICD-10-CM

## 2015-09-19 DIAGNOSIS — R3915 Urgency of urination: Secondary | ICD-10-CM

## 2015-09-19 LAB — BLADDER SCAN AMB NON-IMAGING: Scan Result: 54

## 2015-09-19 NOTE — Progress Notes (Signed)
F/U - left renal mass.    HPI:  1) left renal mass - He underwent MRI of the abdomen August 2016 showed multiple bilateral Bosniak 1 and 2 cysts which were hemorrhagic. There was also a 12 mm left upper pole partially exophytic enhancing mass. Patient underwent a repeat MRI February 2017 which showed stable bilateral Bosniak 1 and 2 cysts and a stable 12 mm left upper pole partially enhancing mass.  2) microscopic hematuria / urgency - pt c/o urgency and UUI for a few months. He voids with a good flow. No dysuria or gross hematuria. He is a smoker. He has 3-10 rbc's / hpf on UA.   Today, patient is seen for the above. He has been well. He underwent a repeat MRI abd Sep 11, 2015. This showed a stable 12 mm LUP mass and bilateral cysts. I reviewed the images today and compared to prior MRI. New c/o urgency and MH on UA.    Past Medical History:  Diagnosis Date  . Asthma without status asthmaticus   . Atherosclerotic cerebrovascular disease 06/09/2014   Overview:  Small and facial weakness with minimal weakness   . CAD (coronary artery disease)   . Cerebral vascular accident ([place])   . Chronic obstructive pulmonary disease ([place]) 06/09/2014   Last Assessment & Plan:  The patient shortness of breath is generally unchanged and treatment is tolerated.     Marland Kitchen COPD (chronic obstructive pulmonary disease) ([place])   . CVA (cerebral vascular accident) ([place]) 2015  . Depression   . Essential (primary) hypertension 06/09/2014   Last Assessment & Plan:  Patients blood pressure has seemingly been controlled without significant side effects such as dizziness or slow heart rate.     . H/O insomnia   . Hypertension 06/10/2010   ECHO at [facility] - see Media tab  . Mixed hyperlipidemia   . Nephrolithiasis   . Obesity   . OSA (obstructive sleep apnea)   . Osteoarthritis   . STEMI (ST elevation myocardial infarction) ([place]) 06/07/2010  . Stroke of unknown cause ([place]) 08/06/2013  . Tobacco abuse   . Urinary incontinence    . Varicose veins    Past Surgical History:  Procedure Laterality Date  . CARDIAC CATHETERIZATION  06/07/2010   50 % ostial narrowing in 1st diagonal from LAD, 2nd diagonal had 40% ostial narrowing; AV groove circumflex was diminutive and occluded proximally; RCA totally occluded in mid segment - stented with 3.5 x 25mm Promus Element DES  - See Media tab  . CARDIAC CATHETERIZATION  05/2013   [place]  . COLONOSCOPY WITH PROPOFOL N/A 02/26/2015   Procedure: COLONOSCOPY WITH PROPOFOL;  Surgeon: Josefine Class, MD;  Location: [facility] ENDOSCOPY;  Service: Endoscopy;  Laterality: N/A;  . ESOPHAGOGASTRODUODENOSCOPY  02/26/2015   Procedure: ESOPHAGOGASTRODUODENOSCOPY (EGD);  Surgeon: Josefine Class, MD;  Location: [facility] END[facility]COPY;  Service: Endoscopy;;  . LOOP RECORDER IMPLANT  08/16/13   Dr. Sallyanne Kuster    Home Medications:   (Not in a hospital admission) Allergies:  Allergies  Allergen Reactions  . Penicillins Hives    Family History  Problem Relation Age of Onset  . Cancer Mother   . Stroke Maternal Grandmother   . Diabetes Brother   . Coronary artery disease Brother    Social History:  reports that he has been smoking Cigarettes.  He has a 48.00 pack-year smoking history. He does not have any smokeless tobacco history on file. He reports that he does not drink alcohol or use drugs.  ROS: A complete review of systems was performed.  All systems are negative except for pertinent findings as noted. Review of Systems  Constitutional: Positive for malaise/fatigue.  HENT: Positive for congestion.   Respiratory: Positive for cough and shortness of breath.   Cardiovascular: Positive for leg swelling.  Gastrointestinal: Positive for heartburn.  Genitourinary: Positive for urgency.  Musculoskeletal: Positive for joint pain.  Endo/Heme/Allergies: Bruises/bleeds easily.  Psychiatric/Behavioral: Positive for depression.     Physical Exam:  Vital signs in last 24  hours: @VSRANGES @ General:  Alert and oriented, No acute distress HEENT: Normocephalic, atraumatic, EOMI Cardiovascular: Regular rate and rhythm Lungs: Regular rate and effort Abdomen: Soft, nontender, nondistended, no abdominal masses Back: No CVA tenderness Extremities: No edema Neurologic: Grossly intact Psych: A&Ox3 GU:  Penis without mass or lesion, testicles descended bilaterally and palpably normal. On digital rectal exam the prostate is small and benign.  Laboratory Data:  No results found for this or any previous visit (from the past 24 hour(s)). No results found for this or any previous visit (from the past 240 hour(s)). Creatinine: No results for input(s): CREATININE in the last 168 hours.  Impression/Assessment/plan:  1) Bilateral bosniak 1 and 2 cysts, 12 mm LUP mass - stable. Given 1 mm change in LUP mass consider MRI in 6 months and yearly at some point.   2) urgency, MH - needs CT and cystoscopy. Bun, Cr and PSA sent today.   Danielly Ackerley 09/19/2015, 3:47 PM

## 2015-09-19 NOTE — Telephone Encounter (Signed)
Spoke with patient and gave results. Patient to follow up as scheduled. Patient ok with plan. 

## 2015-09-19 NOTE — Telephone Encounter (Signed)
-----   Message from Festus Aloe, MD sent at 09/18/2015 11:55 AM EDT ----- Notify patient MRI looks stable - no change in cysts or mass of kidneys. F/u to review.   ----- Message ----- From: Nori Riis, PA-C Sent: 09/11/2015   1:11 PM To: Festus Aloe, MD  I think this is your patient.

## 2015-09-20 LAB — URINALYSIS, COMPLETE
Bilirubin, UA: NEGATIVE
GLUCOSE, UA: NEGATIVE
Ketones, UA: NEGATIVE
LEUKOCYTES UA: NEGATIVE
Nitrite, UA: NEGATIVE
PROTEIN UA: NEGATIVE
Specific Gravity, UA: 1.01 (ref 1.005–1.030)
Urobilinogen, Ur: 1 mg/dL (ref 0.2–1.0)
pH, UA: 6.5 (ref 5.0–7.5)

## 2015-09-20 LAB — BUN+CREAT
BUN / CREAT RATIO: 12 (ref 10–24)
BUN: 14 mg/dL (ref 8–27)
Creatinine, Ser: 1.14 mg/dL (ref 0.76–1.27)
GFR calc non Af Amer: 68 mL/min/{1.73_m2} (ref >59)
GFR, EST AFRICAN AMERICAN: 78 mL/min/{1.73_m2} (ref >59)

## 2015-09-20 LAB — MICROSCOPIC EXAMINATION
BACTERIA UA: NONE SEEN
EPITHELIAL CELLS (NON RENAL): NONE SEEN /HPF (ref 0–10)

## 2015-09-20 LAB — PSA: Prostate Specific Ag, Serum: 1 ng/mL (ref 0.0–4.0)

## 2015-09-21 ENCOUNTER — Telehealth: Payer: Self-pay | Admitting: *Deleted

## 2015-09-21 NOTE — Telephone Encounter (Signed)
Spoke with patient and gave results. 

## 2015-09-21 NOTE — Telephone Encounter (Signed)
-----   Message from Festus Aloe, MD sent at 09/21/2015  8:43 AM EDT ----- Notify patient his PSA and kidney functions are normal. Give results.    ----- Message ----- From: Orlene Erm, CMA Sent: 09/20/2015   8:36 AM To: Festus Aloe, MD    ----- Message ----- From: Lavone Neri Lab Results In Sent: 09/20/2015   5:41 AM To: Rowe Robert Clinical

## 2015-10-02 LAB — CUP PACEART REMOTE DEVICE CHECK: MDC IDC SESS DTM: 20170912153611

## 2015-10-05 ENCOUNTER — Other Ambulatory Visit: Payer: Self-pay | Admitting: Cardiovascular Disease

## 2015-10-08 ENCOUNTER — Ambulatory Visit (INDEPENDENT_AMBULATORY_CARE_PROVIDER_SITE_OTHER): Payer: Commercial Managed Care - HMO | Admitting: *Deleted

## 2015-10-08 DIAGNOSIS — I639 Cerebral infarction, unspecified: Secondary | ICD-10-CM

## 2015-10-08 NOTE — Progress Notes (Signed)
Carelink Summary Report / Loop Recorder 

## 2015-10-10 ENCOUNTER — Telehealth: Payer: Self-pay

## 2015-10-10 ENCOUNTER — Ambulatory Visit
Admission: RE | Admit: 2015-10-10 | Discharge: 2015-10-10 | Disposition: A | Discharge status: Home / Self Care | Payer: Commercial Managed Care - HMO | Source: Ambulatory Visit | Attending: Urology | Admitting: Urology

## 2015-10-10 DIAGNOSIS — N401 Enlarged prostate with lower urinary tract symptoms: Secondary | ICD-10-CM | POA: Insufficient documentation

## 2015-10-10 DIAGNOSIS — R3915 Urgency of urination: Secondary | ICD-10-CM | POA: Diagnosis present

## 2015-10-10 DIAGNOSIS — K8020 Calculus of gallbladder without cholecystitis without obstruction: Secondary | ICD-10-CM | POA: Insufficient documentation

## 2015-10-10 DIAGNOSIS — R918 Other nonspecific abnormal finding of lung field: Secondary | ICD-10-CM | POA: Diagnosis not present

## 2015-10-10 DIAGNOSIS — R3129 Other microscopic hematuria: Secondary | ICD-10-CM

## 2015-10-10 DIAGNOSIS — N2889 Other specified disorders of kidney and ureter: Secondary | ICD-10-CM | POA: Insufficient documentation

## 2015-10-10 DIAGNOSIS — K4090 Unilateral inguinal hernia, without obstruction or gangrene, not specified as recurrent: Secondary | ICD-10-CM | POA: Insufficient documentation

## 2015-10-10 DIAGNOSIS — I700 Atherosclerosis of aorta: Secondary | ICD-10-CM | POA: Insufficient documentation

## 2015-10-10 DIAGNOSIS — N200 Calculus of kidney: Secondary | ICD-10-CM | POA: Insufficient documentation

## 2015-10-10 DIAGNOSIS — N281 Cyst of kidney, acquired: Secondary | ICD-10-CM | POA: Diagnosis not present

## 2015-10-10 DIAGNOSIS — R932 Abnormal findings on diagnostic imaging of liver and biliary tract: Secondary | ICD-10-CM | POA: Insufficient documentation

## 2015-10-10 MED ORDER — IOPAMIDOL (ISOVUE-300) INJECTION 61%
125.0000 mL | Freq: Once | INTRAVENOUS | Status: AC | PRN
  Administered 2015-10-10: 125 mL via INTRAVENOUS

## 2015-10-10 NOTE — Telephone Encounter (Signed)
Notify patient his CT showed nothing worrisome -- f/u for cystoscopy as planned. - Dr. Junious Silk    [facility]

## 2015-10-11 NOTE — Telephone Encounter (Signed)
Spoke with pt in reference to CT results and cysto. Pt voiced understanding.  

## 2015-10-16 ENCOUNTER — Encounter: Payer: Self-pay | Admitting: Urology

## 2015-10-16 ENCOUNTER — Ambulatory Visit (INDEPENDENT_AMBULATORY_CARE_PROVIDER_SITE_OTHER): Payer: Commercial Managed Care - HMO | Admitting: Urology

## 2015-10-16 VITALS — BP 134/80 | HR 82 | Ht 71.0 in | Wt 219.1 lb

## 2015-10-16 DIAGNOSIS — N200 Calculus of kidney: Secondary | ICD-10-CM

## 2015-10-16 DIAGNOSIS — R3129 Other microscopic hematuria: Secondary | ICD-10-CM

## 2015-10-16 DIAGNOSIS — Z125 Encounter for screening for malignant neoplasm of prostate: Secondary | ICD-10-CM | POA: Diagnosis not present

## 2015-10-16 DIAGNOSIS — D3502 Benign neoplasm of left adrenal gland: Secondary | ICD-10-CM

## 2015-10-16 DIAGNOSIS — D4102 Neoplasm of uncertain behavior of left kidney: Secondary | ICD-10-CM | POA: Diagnosis not present

## 2015-10-16 DIAGNOSIS — Q6102 Congenital multiple renal cysts: Secondary | ICD-10-CM | POA: Diagnosis not present

## 2015-10-16 DIAGNOSIS — D3500 Benign neoplasm of unspecified adrenal gland: Secondary | ICD-10-CM | POA: Insufficient documentation

## 2015-10-16 LAB — MICROSCOPIC EXAMINATION: EPITHELIAL CELLS (NON RENAL): NONE SEEN /HPF (ref 0–10)

## 2015-10-16 LAB — URINALYSIS, COMPLETE
Bilirubin, UA: NEGATIVE
Glucose, UA: NEGATIVE
Ketones, UA: NEGATIVE
LEUKOCYTES UA: NEGATIVE
Nitrite, UA: NEGATIVE
PH UA: 7 (ref 5.0–7.5)
Protein, UA: NEGATIVE
Specific Gravity, UA: 1.02 (ref 1.005–1.030)
Urobilinogen, Ur: 1 mg/dL (ref 0.2–1.0)

## 2015-10-16 MED ORDER — CIPROFLOXACIN HCL 500 MG PO TABS
500.0000 mg | ORAL_TABLET | Freq: Once | ORAL | Status: AC
  Administered 2015-10-16: 500 mg via ORAL

## 2015-10-16 MED ORDER — LIDOCAINE HCL 2 % EX GEL
1.0000 "application " | Freq: Once | CUTANEOUS | Status: AC
  Administered 2015-10-16: 1 via URETHRAL

## 2015-10-16 NOTE — Progress Notes (Signed)
10/16/2015 8:28 AM   Louis Gutierrez 1950-04-08 [id number]  Referring provider: Kirk Ruths, MD [facility] [place], [place] [address]  No chief complaint on file.   HPI:  1 - Bilateral Non-Complex Renal Cysts - bilatereal <2cm cysts w/o mass effect on imaging x several.  2 - Small Left Renal Mass - 22mm left upper pole solid enhancing mass noted on CT 2015 and stable on surveillance:  09/2015 - MRI, CT - no change  3 - Left Adrenal Adenoma - 3.6cm left adrenal neoplasm stable on imaging since 2015. No reracotry HTN or hypokalemia.  4 - Nephrolithiasis - incidental Rt upper pole 36m sotne on CT 2017 on hematuria eval. No hydro, no priror colic.   5 - Microscopic Hematuria - blood on UA noted x many. Dedicated heamturia CT 2017 with small Rt stone and small bilateral cysts / mass. Cysto 09/2015 large prosatate only. Former smoker.  6 - Prostate Screening -  09/2015 - PSA 1.0  PMH sig for CAD/MI/Plavix, COPD,   Today " Louis Gutierrez " is seen for cysto and f/u above. No interval gross hematuria. Interval imaging very favorable w/o additional upper tract leisons.     PMH: Past Medical History:  Diagnosis Date  . Asthma without status asthmaticus   . Atherosclerotic cerebrovascular disease 06/09/2014   Overview:  Small and facial weakness with minimal weakness   . CAD (coronary artery disease)   . Cerebral vascular accident ([place])   . Chronic obstructive pulmonary disease ([place]) 06/09/2014   Last Assessment & Plan:  The patient shortness of breath is generally unchanged and treatment is tolerated.     Marland Kitchen COPD (chronic obstructive pulmonary disease) ([place])   . CVA (cerebral vascular accident) ([place]) 2015  . Depression   . Essential (primary) hypertension 06/09/2014   Last Assessment & Plan:  Patients blood pressure has seemingly been controlled without significant side effects such as dizziness or slow heart rate.     . H/O insomnia   .  Hypertension 06/10/2010   ECHO at [facility] - see Media tab  . Mixed hyperlipidemia   . Nephrolithiasis   . Obesity   . OSA (obstructive sleep apnea)   . Osteoarthritis   . STEMI (ST elevation myocardial infarction) ([place]) 06/07/2010  . Stroke of unknown cause ([place]) 08/06/2013  . Tobacco abuse   . Urinary incontinence   . Varicose veins     Surgical History: Past Surgical History:  Procedure Laterality Date  . CARDIAC CATHETERIZATION  06/07/2010   50 % ostial narrowing in 1st diagonal from LAD, 2nd diagonal had 40% ostial narrowing; AV groove circumflex was diminutive and occluded proximally; RCA totally occluded in mid segment - stented with 3.5 x 36mm Promus Element DES  - See Media tab  . CARDIAC CATHETERIZATION  05/2013   [place]  . COLONOSCOPY WITH PROPOFOL N/A 02/26/2015   Procedure: COLONOSCOPY WITH PROPOFOL;  Surgeon: Josefine Class, MD;  Location: [facility] ENDOSCOPY;  Service: Endoscopy;  Laterality: N/A;  . ESOPHAGOGASTRODUODENOSCOPY  02/26/2015   Procedure: ESOPHAGOGASTRODUODENOSCOPY (EGD);  Surgeon: Josefine Class, MD;  Location[facility] ENDOSCOPY;  Service: Endoscopy;;  . LOOP RECORDER IMPLANT  08/16/13   Dr. Sallyanne Kuster    Home Medications:    Medication List       Accurate as of 10/16/15  8:28 AM. Always use your most recent med list.          acetaminophen 500 MG tablet Commonly known as:  TYLENOL Take 500 mg by mouth as needed for pain.   albuterol 108 (90 Base) MCG/ACT inhaler Commonly known as:  PROVENTIL HFA;VENTOLIN HFA Inhale 1 puff into the lungs 2 (two) times daily.   aspirin EC 81 MG tablet Take 81 mg by mouth daily.   beclomethasone 80 MCG/ACT inhaler Commonly known as:  QVAR Inhale 1 puff into the lungs 2 (two) times daily.   clopidogrel 75 MG tablet Commonly known as:  PLAVIX TAKE ONE (1) TABLET BY MOUTH EVERY DAY   Ipratropium-Albuterol 20-100 MCG/ACT Aers respimat Commonly known as:  COMBIVENT Inhale into the lungs.   isosorbide mononitrate 30  MG 24 hr tablet Commonly known as:  IMDUR Take 30 mg by mouth.   isosorbide mononitrate 30 MG 24 hr tablet Commonly known as:  IMDUR Take 1 tablet (30 mg total) by mouth daily. Please schedule an appointment for future refills [phone]   nitroGLYCERIN 0.4 MG SL tablet Commonly known as:  NITROSTAT Place 0.4 mg under the tongue.   PARoxetine 40 MG tablet Commonly known as:  PAXIL Take 40 mg by mouth.   pravastatin 40 MG tablet Commonly known as:  PRAVACHOL Take 1 tablet (40 mg total) by mouth daily.   ramipril 10 MG capsule Commonly known as:  ALTACE TAKE ONE (1) CAPSULE EACH DAY   traMADol 50 MG tablet Commonly known as:  ULTRAM Take by mouth every 6 (six) hours as needed. Reported on 03/19/2015   traZODone 100 MG tablet Commonly known as:  DESYREL Take 100 mg by mouth.       Allergies:  Allergies  Allergen Reactions  . Penicillins Hives    Family History: Family History  Problem Relation Age of Onset  . Cancer Mother   . Stroke Maternal Grandmother   . Diabetes Brother   . Coronary artery disease Brother     Social History:  reports that he has been smoking Cigarettes.  He has a 48.00 pack-year smoking history. He does not have any smokeless tobacco history on file. He reports that he does not drink alcohol or use drugs.     Review of Systems  Gastrointestinal (upper)  : Negative for upper GI symptoms  Gastrointestinal (lower) : Negative for lower GI symptoms  Constitutional : Negative for symptoms  Skin: Negative for skin symptoms  Eyes: Negative for eye symptoms  Ear/Nose/Throat : Negative for Ear/Nose/Throat symptoms  Hematologic/Lymphatic: Negative for Hematologic/Lymphatic symptoms  Cardiovascular : Negative for cardiovascular symptoms  Respiratory : Negative for respiratory symptoms  Endocrine: Negative for endocrine symptoms  Musculoskeletal: Negative for musculoskeletal symptoms  Neurological: Negative for  neurological symptoms  Psychologic: Negative for psychiatric symptoms     Physical Exam: There were no vitals taken for this visit.  Constitutional:  Alert and oriented, No acute distress. HEENT: [place] AT, moist mucus membranes.  Trachea midline, no masses. Cardiovascular: No clubbing, cyanosis, or edema. Respiratory: Normal respiratory effort, no increased work of breathing. GI: Abdomen is soft, nontender, nondistended, no abdominal masses GU: No CVA tenderness.Circ'd. Sraight phallus. Skin: No rashes, bruises or suspicious lesions. Lymph: No cervical or inguinal adenopathy. Neurologic: Grossly intact, no focal deficits, moving all 4 extremities. Psychiatric: Normal mood and affect.  Laboratory Data: Lab Results  Component Value Date   WBC 7.3 08/03/2014   HGB 12.9 (L) 08/03/2014   HCT 39.1 (L) 08/03/2014   MCV 84.6 08/03/2014   PLT 275 08/03/2014    Lab Results  Component Value Date   CREATININE 1.14 09/19/2015  No results found for: PSA  No results found for: TESTOSTERONE  Lab Results  Component Value Date   HGBA1C 6.1 04/05/2013    Urinalysis    Component Value Date/Time   COLORURINE Straw 12/05/2013 1845   APPEARANCEUR Clear 09/19/2015 1617   LABSPEC 1.006 12/05/2013 1845   PHURINE 6.0 12/05/2013 1845   GLUCOSEU Negative 09/19/2015 1617   GLUCOSEU Negative 12/05/2013 1845   HGBUR 1+ 12/05/2013 1845   BILIRUBINUR Negative 09/19/2015 1617   BILIRUBINUR Negative 12/05/2013 1845   KETONESUR Negative 12/05/2013 1845   PROTEINUR Negative 09/19/2015 1617   PROTEINUR Negative 12/05/2013 1845   NITRITE Negative 09/19/2015 1617   NITRITE Negative 12/05/2013 1845   LEUKOCYTESUR Negative 09/19/2015 1617   LEUKOCYTESUR Negative 12/05/2013 1845       Cystoscopy Procedure Note  Patient identification was confirmed, informed consent was obtained, and patient was prepped using Betadine solution.  Lidocaine jelly was administered per urethral meatus.     Preoperative abx where received prior to procedure.     Pre-Procedure: - Inspection reveals a normal caliber ureteral meatus.  Procedure: The flexible cystoscope was introduced without difficulty - No urethral strictures/lesions are present. - Enlarged prostate bilobar, small median lobe, some friability - Normal bladder neck - Bilateral ureteral orifices identified - Bladder mucosa  reveals no ulcers, tumors, or lesions - No bladder stones - No trabeculation  Retroflexion shows no addiitonal findings   Post-Procedure: - Patient tolerated the procedure well   Pertinent Imaging: As per above  Assessment & Plan:    1 - Bilateral Non-Complex Renal Cysts - stable x years. Continue surveillance per renal mass below.   2 - Small Left Renal Mass - likely indolent neoplasm. Given comorbidity rec continued annual surveillance, consider intervention if beocmes >2.5cm. Alternate MRI, CT, [place].   3 - Left Adrenal Adenoma - stable, surveillance as per above.   4 - Nephrolithiasis - small volueme, non-obstructive, this may be source of microhematuria. Do NOT recommend any sort of 'pre emptive' treatment as would have good chance of medical passage should it drop.  5 - Microscopic Hematuria - eval with labs, exam, CT, cysto w/o worrisome etiology, consider repeat eval for future gross episodes only.   6 - Prostate Screening - up to date this year, contiue annual screening untiel age 19 or so as average risk.   7 - RTC 1 year with renal US, PSA.    No Follow-up on file.  Alexis Frock, [facility] [address] [place], [place] [address] [phone]

## 2015-11-02 LAB — CUP PACEART REMOTE DEVICE CHECK: MDC IDC SESS DTM: 20171013101656

## 2015-11-05 ENCOUNTER — Ambulatory Visit (INDEPENDENT_AMBULATORY_CARE_PROVIDER_SITE_OTHER): Payer: Commercial Managed Care - HMO | Admitting: *Deleted

## 2015-11-05 DIAGNOSIS — I639 Cerebral infarction, unspecified: Secondary | ICD-10-CM

## 2015-11-05 NOTE — Progress Notes (Signed)
Carelink Summary Report / Loop Recorder 

## 2015-12-05 ENCOUNTER — Ambulatory Visit (INDEPENDENT_AMBULATORY_CARE_PROVIDER_SITE_OTHER): Payer: Commercial Managed Care - HMO | Admitting: *Deleted

## 2015-12-05 DIAGNOSIS — I639 Cerebral infarction, unspecified: Secondary | ICD-10-CM

## 2015-12-05 NOTE — Progress Notes (Signed)
Carelink Summary Report / Loop Recorder 

## 2015-12-08 LAB — CUP PACEART REMOTE DEVICE CHECK
Implantable Pulse Generator Implant Date: 20150728
MDC IDC SESS DTM: 20171118043039

## 2015-12-08 NOTE — Progress Notes (Signed)
Carelink summary report received. Battery status OK. Normal device function. No new symptom episodes, tachy episodes, brady, or pause episodes. No new AF episodes. Monthly summary reports and ROV/PRN 

## 2015-12-26 DIAGNOSIS — R079 Chest pain, unspecified: Secondary | ICD-10-CM | POA: Diagnosis not present

## 2015-12-27 DIAGNOSIS — R0781 Pleurodynia: Secondary | ICD-10-CM | POA: Diagnosis not present

## 2015-12-27 DIAGNOSIS — R079 Chest pain, unspecified: Secondary | ICD-10-CM | POA: Diagnosis not present

## 2015-12-27 DIAGNOSIS — I10 Essential (primary) hypertension: Secondary | ICD-10-CM | POA: Diagnosis not present

## 2015-12-27 DIAGNOSIS — M25511 Pain in right shoulder: Secondary | ICD-10-CM | POA: Diagnosis not present

## 2015-12-31 DIAGNOSIS — I10 Essential (primary) hypertension: Secondary | ICD-10-CM | POA: Diagnosis not present

## 2015-12-31 DIAGNOSIS — I25119 Atherosclerotic heart disease of native coronary artery with unspecified angina pectoris: Secondary | ICD-10-CM | POA: Diagnosis not present

## 2015-12-31 DIAGNOSIS — E119 Type 2 diabetes mellitus without complications: Secondary | ICD-10-CM | POA: Diagnosis not present

## 2016-01-04 ENCOUNTER — Ambulatory Visit (INDEPENDENT_AMBULATORY_CARE_PROVIDER_SITE_OTHER): Payer: Commercial Managed Care - HMO | Admitting: *Deleted

## 2016-01-04 DIAGNOSIS — I639 Cerebral infarction, unspecified: Secondary | ICD-10-CM

## 2016-01-04 NOTE — Progress Notes (Signed)
Carelink Summary Report / Loop Recorder 

## 2016-01-07 DIAGNOSIS — J449 Chronic obstructive pulmonary disease, unspecified: Secondary | ICD-10-CM | POA: Diagnosis not present

## 2016-01-07 DIAGNOSIS — I10 Essential (primary) hypertension: Secondary | ICD-10-CM | POA: Diagnosis not present

## 2016-01-07 DIAGNOSIS — K4090 Unilateral inguinal hernia, without obstruction or gangrene, not specified as recurrent: Secondary | ICD-10-CM | POA: Diagnosis not present

## 2016-01-07 DIAGNOSIS — E119 Type 2 diabetes mellitus without complications: Secondary | ICD-10-CM | POA: Diagnosis not present

## 2016-01-07 DIAGNOSIS — I25119 Atherosclerotic heart disease of native coronary artery with unspecified angina pectoris: Secondary | ICD-10-CM | POA: Diagnosis not present

## 2016-01-07 DIAGNOSIS — F325 Major depressive disorder, single episode, in full remission: Secondary | ICD-10-CM | POA: Diagnosis not present

## 2016-01-07 DIAGNOSIS — Z0000 Encounter for general adult medical examination without abnormal findings: Secondary | ICD-10-CM | POA: Diagnosis not present

## 2016-01-08 ENCOUNTER — Encounter: Payer: Self-pay | Admitting: General Surgery

## 2016-01-17 LAB — CUP PACEART REMOTE DEVICE CHECK
Date Time Interrogation Session: 20171228114153
MDC IDC PG IMPLANT DT: 20150728

## 2016-01-30 ENCOUNTER — Ambulatory Visit (INDEPENDENT_AMBULATORY_CARE_PROVIDER_SITE_OTHER): Payer: Medicare HMO | Admitting: Cardiovascular Disease

## 2016-01-30 ENCOUNTER — Encounter: Payer: Self-pay | Admitting: Cardiovascular Disease

## 2016-01-30 VITALS — BP 123/76 | HR 63 | Ht 71.0 in | Wt 222.6 lb

## 2016-01-30 DIAGNOSIS — I2510 Atherosclerotic heart disease of native coronary artery without angina pectoris: Secondary | ICD-10-CM | POA: Diagnosis not present

## 2016-01-30 DIAGNOSIS — I10 Essential (primary) hypertension: Secondary | ICD-10-CM

## 2016-01-30 DIAGNOSIS — E782 Mixed hyperlipidemia: Secondary | ICD-10-CM | POA: Diagnosis not present

## 2016-01-30 DIAGNOSIS — Z8673 Personal history of transient ischemic attack (TIA), and cerebral infarction without residual deficits: Secondary | ICD-10-CM | POA: Diagnosis not present

## 2016-01-30 DIAGNOSIS — Z4509 Encounter for adjustment and management of other cardiac device: Secondary | ICD-10-CM

## 2016-01-30 NOTE — Patient Instructions (Signed)
Dr Croitoru recommends that you schedule a follow-up appointment in 1 year. You will receive a reminder letter in the mail two months in advance. If you don't receive a letter, please call our office to schedule the follow-up appointment.  If you need a refill on your cardiac medications before your next appointment, please call your pharmacy. 

## 2016-01-30 NOTE — Progress Notes (Signed)
Cardiology Office Note    Date:  01/30/2016   ID:  Louis Gutierrez, DOB 1950-09-15, MRN [id number]  PCP:  Kirk Ruths., MD  Cardiologist:   Sanda Klein, MD   Chief Complaint  Patient presents with  . Follow-up    pt c/o DOE     History of Present Illness:  Louis Gutierrez is a 66 y.o. male with coronary artery disease and a history of cryptogenic stroke. He returns in follow-up and a loop recorder check is performed today.  He has not had any new neurological events since his stroke in May 2015. His loop recorder has not shown any atrial fibrillation or any other arrhythmia in 2-1/2 years. It is likely to reach end of service this summer and I don't see a reason why we should replace it.  Ever since his stroke he has had some short-term memory issues and has become much less tolerance to heat. 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 He feels a lot less useful and is a little depressed about it. He does not feel that he can afford to retire, but has been forced to consider it.  He has not had angina pectoris and denies exertional dyspnea, syncope, palpitations, leg edema, claudication or other cardiovascular complaints. He reports that his blood pressures consistently normal. His most recent hemoglobin A1c in December was 6.2% consistent with good control. His most recent lipid profile performed around that time shows an excellent LDL of 61 but also shows a chronically low HDL of 31. He successfully quit smoking after his stroke and has gained a lot of weight since then. He is mildly obese with a BMI of 31.  He presented with ST segment elevation myocardial infarction in May of 2012. At that time had an inferior wall infarction, manifesting as unstable angina pectoris and eventually a syncopal episode. The right coronary artery was totally occluded and revascularization was difficult but eventually he received a 3.5  x 24 mm Promus drug-eluting stent to the mid RCA. Also noted was 50% ostial stenosis of the first diagonal artery and 40% ostial stenosis of the second diagonal artery branches of the LAD, as well as total occlusion of a small left circumflex system. There was evidence of inferolateral hypokinesis and ejection fraction of 45-50% at that time. He presented with chest pain to [facility] in 2015 when he had a repeat coronary angiogram that was reportedly normal. I don't have that report. In May 2015 he presented with right hemiparesis and expressive aphasia with an infarction in the territory of the left middle cerebral artery. An etiology was not identified. He underwent loop recorder implantation in August 2015 and so far the device has not recorded any arrhythmia. His most recent echo performed in March 2015 showed left ventricular ejection fraction of 50-55 percent with abnormal relaxation and no regional wall motion abnormalities. Both atria were described as normal in size and there were no serious valvular abnormalities.   Past Medical History:  Diagnosis Date  . Asthma without status asthmaticus   . Atherosclerotic cerebrovascular disease 06/09/2014   Overview:  Small and facial weakness with minimal weakness   . CAD (coronary artery disease)   . Cerebral vascular accident ([place])   . Chronic obstructive pulmonary disease ([place]) 06/09/2014   Last Assessment & Plan:  The patient shortness of breath is generally unchanged and treatment is tolerated.     Marland Kitchen COPD (  chronic obstructive pulmonary disease) ([place])   . CVA (cerebral vascular accident) ([place]) 2015  . Depression   . Essential (primary) hypertension 06/09/2014   Last Assessment & Plan:  Patients blood pressure has seemingly been controlled without significant side effects such as dizziness or slow heart rate.     . H/O insomnia   . Hypertension 06/10/2010   ECHO at [facility] - see Media tab  . Mixed hyperlipidemia   . Nephrolithiasis     . Obesity   . OSA (obstructive sleep apnea)   . Osteoarthritis   . STEMI (ST elevation myocardial infarction) ([place]) 06/07/2010  . Stroke of unknown cause ([place]) 08/06/2013  . Tobacco abuse   . Urinary incontinence   . Varicose veins     Past Surgical History:  Procedure Laterality Date  . CARDIAC CATHETERIZATION  06/07/2010   50 % ostial narrowing in 1st diagonal from LAD, 2nd diagonal had 40% ostial narrowing; AV groove circumflex was diminutive and occluded proximally; RCA totally occluded in mid segment - stented with 3.5 x 83mm Promus Element DES  - See Media tab  . CARDIAC CATHETERIZATION  05/2013   [place]  . COLONOSCOPY WITH PROPOFOL N/A 02/26/2015   Procedure: COLONOSCOPY WITH PROPOFOL;  Surgeon: Josefine Class, MD;  Location: [facility] ENDOSCOPY;  Service: Endoscopy;  Laterality: N/A;  . ESOPHAGOGASTRODUODENOSCOPY  02/26/2015   Procedure: ESOPHAGOGASTRODUODENOSCOPY (EGD);  Surgeon: Josefine Class, MD;  Location: [facility] ENDOSCOPY;  Service: Endoscopy;;  . LOOP RECORDER IMPLANT  08/16/13   Dr. Sallyanne Kuster    Current Medications: Outpatient Medications Prior to Visit  Medication Sig Dispense Refill  . acetaminophen (TYLENOL) 500 MG tablet Take 500 mg by mouth as needed for pain.    Marland Kitchen aspirin EC 81 MG tablet Take 81 mg by mouth daily.    . clopidogrel (PLAVIX) 75 MG tablet TAKE ONE (1) TABLET BY MOUTH EVERY DAY 30 tablet 9  . isosorbide mononitrate (IMDUR) 30 MG 24 hr tablet Take 30 mg by mouth daily.     . nitroGLYCERIN (NITROSTAT) 0.4 MG SL tablet Place 0.4 mg under the tongue.    Marland Kitchen PARoxetine (PAXIL) 40 MG tablet Take 40 mg by mouth.    . pravastatin (PRAVACHOL) 40 MG tablet Take 1 tablet (40 mg total) by mouth daily. 30 tablet 3  . ramipril (ALTACE) 10 MG capsule TAKE ONE (1) CAPSULE EACH DAY 30 capsule 10  . traZODone (DESYREL) 100 MG tablet Take 100 mg by mouth.    Marland Kitchen albuterol (PROVENTIL HFA;VENTOLIN HFA) 108 (90 BASE) MCG/ACT inhaler Inhale 1 puff into the lungs 2 (two) times  daily.    . beclomethasone (QVAR) 80 MCG/ACT inhaler Inhale 1 puff into the lungs 2 (two) times daily.    . Ipratropium-Albuterol (COMBIVENT) 20-100 MCG/ACT AERS respimat Inhale into the lungs.    . traMADol (ULTRAM) 50 MG tablet Take by mouth every 6 (six) hours as needed. Reported on 03/19/2015     No facility-administered medications prior to visit.      Allergies:   Penicillins   Social History   Social History  . Marital status: Married    Spouse name: N/A  . Number of children: N/A  . Years of education: N/A   Social History Main Topics  . Smoking status: Current Every Day Smoker    Packs/day: 1.00    Years: 48.00    Types: Cigarettes  . Smokeless tobacco: None  . Alcohol use No  . Drug use: No  . Sexual activity: Not  Asked   Other Topics Concern  . None   Social History Narrative  . None     Family History:  The patient's family history includes Cancer in his mother; Coronary artery disease in his brother; Diabetes in his brother; Stroke in his maternal grandmother.   ROS:   Please see the history of present illness.    ROS All other systems reviewed and are negative.   PHYSICAL EXAM:   VS:  BP 123/76 (BP Location: Left Arm, Patient Position: Sitting, Cuff Size: Normal)   Ht 5\' 11"  (1.803 m)   Wt 222 lb 9.6 oz (101 kg)   BMI 31.05 kg/m    GEN: Well nourished, well developed, in no acute distress  HEENT: normal  Neck: no JVD, carotid bruits, or masses Cardiac: RRR; no murmurs, rubs, or gallops,no edema , Loop recorder site looks healthy Respiratory:  clear to auscultation bilaterally, normal work of breathing GI: soft, nontender, nondistended, + BS MS: no deformity or atrophy  Skin: warm and dry, no rash Neuro:  Alert and Oriented x 3, Strength and sensation are intact Psych: euthymic mood, full affect  Wt Readings from Last 3 Encounters:  01/30/16 222 lb 9.6 oz (101 kg)  10/16/15 219 lb 1.6 oz (99.4 kg)  09/19/15 220 lb 14.4 oz (100.2 kg)       Studies/Labs Reviewed:   EKG:  EKG is ordered today.  The ekg ordered today demonstrates Sinus rhythm, inferior Q waves, QTC 413 ms. There are no repolarization abnormalities.  Recent Labs: 09/19/2015: BUN 14; Creatinine, Ser 1.14   Lipid Panel    Component Value Date/Time   CHOL 110 05/22/2013 0526   TRIG 162 05/22/2013 0526   HDL 17 (L) 05/22/2013 0526   CHOLHDL 5.3 06/22/2012 1020   VLDL 32 05/22/2013 0526   LDLCALC 61 05/22/2013 0526    ASSESSMENT:    1. CAD in native artery   2. History of arterial ischemic stroke   3. Mixed hyperlipidemia   4. Essential hypertension      PLAN:  In order of problems listed above:  1. CAD: Asymptomatic. Continue current medications. Risk factors are generally very well addressed, with the exception of his weight and his HDL cholesterol. In fact, his HDL is substantially better than it was at the time of his stroke in 2015 but it is still too low. Recommended weight loss and more physical activity. 2. Hx cryptogenic ischemic stroke: 2-1/2 years of Loop recorder monitoring has not shown evidence of atrial fibrillation. Continue antiplatelet therapy. 3. HLP: Excellent LDL.In fact, his HDL is substantially better than it was at the time of his stroke in 2015 but it is still too low. Triglycerides are in range at 135. Total cholesterol 119. 4. HTN: Good blood pressure control 5. Congratulated on smoking cessation    Medication Adjustments/Labs and Tests Ordered: Current medicines are reviewed at length with the patient today.  Concerns regarding medicines are outlined above.  Medication changes, Labs and Tests ordered today are listed in the Patient Instructions below. There are no Patient Instructions on file for this visit.   Signed, Sanda Klein, MD  01/30/2016 10:52 AM    [facility] [address], [place], [place]  [address] Phone: [phone]; Fax: [phone]

## 2016-01-31 DIAGNOSIS — Z8673 Personal history of transient ischemic attack (TIA), and cerebral infarction without residual deficits: Secondary | ICD-10-CM | POA: Insufficient documentation

## 2016-01-31 LAB — CUP PACEART INCLINIC DEVICE CHECK
Date Time Interrogation Session: 20180111173014
MDC IDC PG IMPLANT DT: 20150728

## 2016-02-04 ENCOUNTER — Ambulatory Visit (INDEPENDENT_AMBULATORY_CARE_PROVIDER_SITE_OTHER): Payer: Medicare HMO | Admitting: *Deleted

## 2016-02-04 DIAGNOSIS — I639 Cerebral infarction, unspecified: Secondary | ICD-10-CM

## 2016-02-05 ENCOUNTER — Encounter: Payer: Self-pay | Admitting: General Surgery

## 2016-02-05 ENCOUNTER — Ambulatory Visit (INDEPENDENT_AMBULATORY_CARE_PROVIDER_SITE_OTHER): Payer: Medicare HMO | Admitting: General Surgery

## 2016-02-05 VITALS — BP 132/70 | HR 86 | Resp 16 | Ht 71.0 in | Wt 222.0 lb

## 2016-02-05 DIAGNOSIS — K4090 Unilateral inguinal hernia, without obstruction or gangrene, not specified as recurrent: Secondary | ICD-10-CM

## 2016-02-05 NOTE — Progress Notes (Signed)
Patient ID: Louis Gutierrez, male   DOB: July 25, 1950, 66 y.o.   MRN: [id number]  Chief Complaint  Patient presents with  . Hernia    HPI Louis Gutierrez is a 66 y.o. male here today for a evaluation of a left inguinal hernia. He states he noticied a knot and some pain about 6 months ago. He felt a "poping sensation" was pushing a VW Tiguan up on a roll back/ low boy trailer. Bending over makes the discomfort worse. The area has become more symptomatic since initial discovery. Bowels move every other day, no bleeding.   HPI  Past Medical History:  Diagnosis Date  . Asthma without status asthmaticus   . Atherosclerotic cerebrovascular disease 06/09/2014   Overview:  Small and facial weakness with minimal weakness   . CAD (coronary artery disease)   . Cerebral vascular accident ([place])   . Chronic obstructive pulmonary disease ([place]) 06/09/2014   Last Assessment & Plan:  The patient shortness of breath is generally unchanged and treatment is tolerated.     Marland Kitchen COPD (chronic obstructive pulmonary disease) ([place])   . CVA (cerebral vascular accident) ([place]) 2015  . Depression   . Essential (primary) hypertension 06/09/2014   Last Assessment & Plan:  Patients blood pressure has seemingly been controlled without significant side effects such as dizziness or slow heart rate.     . H/O insomnia   . Hypertension 06/10/2010   ECHO at [facility] - see Media tab  . Mixed hyperlipidemia   . Nephrolithiasis   . Obesity   . OSA (obstructive sleep apnea)   . Osteoarthritis   . STEMI (ST elevation myocardial infarction) ([place]) 06/07/2010  . Stroke of unknown cause ([place]) 08/06/2013  . Tobacco abuse   . Urinary incontinence   . Varicose veins     Past Surgical History:  Procedure Laterality Date  . BACK SURGERY  2003   C3C4  . CARDIAC CATHETERIZATION  06/07/2010   50 % ostial narrowing in 1st diagonal from LAD, 2nd diagonal had 40% ostial narrowing; AV groove circumflex was diminutive and occluded  proximally; RCA totally occluded in mid segment - stented with 3.5 x 71mm Promus Element DES  - See Media tab  . CARDIAC CATHETERIZATION  05/2013   [place]  . COLONOSCOPY WITH PROPOFOL N/A 02/26/2015   Procedure: COLONOSCOPY WITH PROPOFOL;  Surgeon: Josefine Class, MD;  Location: [facility] ENDOSCOPY;  Service: Endoscopy;  Laterality: N/A;  . ESOPHAGOGASTRODUODENOSCOPY  02/26/2015   Procedure: ESOPHAGOGASTRODUODENOSCOPY (EGD);  Surgeon: Josefine Class, MD;  Location: [facility] ENDOSCOPY;  Service: Endoscopy;;  . LOOP RECORDER IMPLANT  08/16/13   Dr. Sallyanne Kuster    Family History  Problem Relation Age of Onset  . Cancer Mother     oral  . Stroke Maternal Grandmother   . Diabetes Brother   . Coronary artery disease Brother     Social History Social History  Substance Use Topics  . Smoking status: Current Every Day Smoker    Packs/day: 1.00    Years: 53.00    Types: Cigarettes  . Smokeless tobacco: Never Used  . Alcohol use No    Allergies  Allergen Reactions  . Penicillins Hives    Current Outpatient Prescriptions  Medication Sig Dispense Refill  . acetaminophen (TYLENOL) 500 MG tablet Take 500 mg by mouth as needed for pain.    Marland Kitchen ADVAIR DISKUS 100-50 MCG/DOSE AEPB Inhale 1 puff into the lungs 2 (two) times daily.    Marland Kitchen aspirin EC 81 MG  tablet Take 81 mg by mouth daily.    . clopidogrel (PLAVIX) 75 MG tablet TAKE ONE (1) TABLET BY MOUTH EVERY DAY 30 tablet 9  . isosorbide mononitrate (IMDUR) 30 MG 24 hr tablet Take 30 mg by mouth daily.     . nitroGLYCERIN (NITROSTAT) 0.4 MG SL tablet Place 0.4 mg under the tongue.    Marland Kitchen PARoxetine (PAXIL) 40 MG tablet Take 40 mg by mouth.    . pravastatin (PRAVACHOL) 40 MG tablet Take 1 tablet (40 mg total) by mouth daily. 30 tablet 3  . ramipril (ALTACE) 10 MG capsule TAKE ONE (1) CAPSULE EACH DAY 30 capsule 10  . traZODone (DESYREL) 100 MG tablet Take 100 mg by mouth.     No current facility-administered medications for this visit.      Review of Systems Review of Systems  Constitutional: Negative.   Respiratory: Negative.   Cardiovascular: Negative.   Gastrointestinal: Negative for constipation, diarrhea and nausea.    Blood pressure 132/70, pulse 86, resp. rate 16, height 5\' 11"  (1.803 m), weight 222 lb (100.7 kg).  Physical Exam Physical Exam  Constitutional: He is oriented to person, place, and time. He appears well-developed and well-nourished.  HENT:  Mouth/Throat: Oropharynx is clear and moist.  Eyes: Conjunctivae are normal. No scleral icterus.  Neck: Neck supple.  Cardiovascular: Normal rate, regular rhythm and normal heart sounds.   No lower leg edema  Pulmonary/Chest: Effort normal and breath sounds normal.  Abdominal: Soft. Normal appearance and bowel sounds are normal. There is no tenderness. A hernia is present. Hernia confirmed positive in the left inguinal area. Hernia confirmed negative in the right inguinal area.  Genitourinary: Testes normal and penis normal.     Lymphadenopathy:    He has no cervical adenopathy.  Neurological: He is alert and oriented to person, place, and time.  Skin: Skin is warm.  Psychiatric: His behavior is normal.    Data Reviewed Cardiology note of 01/30/2016 reviewed.   Assessment    Symptomatic left inguinal hernia.     Plan         Hernia precautions and incarceration were discussed with the patient. If they develop symptoms of an incarcerated hernia, they were encouraged to seek prompt medical attention.I have recommended repair of the hernia using mesh on an outpatient basis in the near future. The risk of infection was reviewed. The role of prosthetic mesh to minimize the risk of recurrence was reviewed.  The patient is scheduled for surgery at [facility] on 02/25/16. He will be off of his Plavix for one week prior. He will pre admit at the hospital on 02/19/16. The patient is aware of dates, time, and instructions.   This information has been scribed  by Karie Fetch RN, BSN,BC.  Robert Bellow 02/05/2016, 11:42 AM

## 2016-02-05 NOTE — Patient Instructions (Addendum)
The patient is aware to call back for any questions or concerns.  Inguinal Hernia, Adult Introduction An inguinal hernia is when fat or the intestines push through the area where the leg meets the lower belly (groin) and make a rounded lump (bulge). This condition happens over time. There are three types of inguinal hernias. These types include:  Hernias that can be pushed back into the belly (are reducible).  Hernias that cannot be pushed back into the belly (are incarcerated).  Hernias that cannot be pushed back into the belly and lose their blood supply (get strangulated). This type needs emergency surgery. Follow these instructions at home: Lifestyle  Drink enough fluid to keep your urine (pee) clear or pale yellow.  Eat plenty of fruits, vegetables, and whole grains. These have a lot of fiber. Talk with your doctor if you have questions.  Avoid lifting heavy objects.  Avoid standing for long periods of time.  Do not use tobacco products. These include cigarettes, chewing tobacco, or e-cigarettes. If you need help quitting, ask your doctor.  Try to stay at a healthy weight. General instructions  Do not try to force the hernia back in.  Watch your hernia for any changes in color or size. Let your doctor know if there are any changes.  Take over-the-counter and prescription medicines only as told by your doctor.  Keep all follow-up visits as told by your doctor. This is important. Contact a doctor if:  You have a fever.  You have new symptoms.  Your symptoms get worse. Get help right away if:  The area where the legs meets the lower belly has:  Pain that gets worse suddenly.  A bulge that gets bigger suddenly and does not go down.  A bulge that turns red or purple.  A bulge that is painful to the touch.  You are a man and your scrotum:  Suddenly feels painful.  Suddenly changes in size.  You feel sick to your stomach (nauseous) and this feeling does not go  away.  You throw up (vomit) and this keeps happening.  You feel your heart beating a lot more quickly than normal.  You cannot poop (have a bowel movement) or pass gas. This information is not intended to replace advice given to you by your health care provider. Make sure you discuss any questions you have with your health care provider. Document Released: 02/06/2006 Document Revised: 06/14/2015 Document Reviewed: 11/16/2013  2017 Elsevier  The patient is scheduled for surgery at [facility] on 02/25/16. He will be off of his Plavix for one week prior. He will pre admit at the hospital on 02/19/16. The patient is aware of dates, time, and instructions.

## 2016-02-05 NOTE — Progress Notes (Signed)
Carelink Summary Report 

## 2016-02-08 ENCOUNTER — Other Ambulatory Visit: Payer: Self-pay | Admitting: Cardiovascular Disease

## 2016-02-15 ENCOUNTER — Other Ambulatory Visit: Payer: Self-pay | Admitting: Internal Medicine

## 2016-02-16 DIAGNOSIS — R05 Cough: Secondary | ICD-10-CM | POA: Diagnosis not present

## 2016-02-16 DIAGNOSIS — J181 Lobar pneumonia, unspecified organism: Secondary | ICD-10-CM | POA: Diagnosis not present

## 2016-02-19 ENCOUNTER — Telehealth: Payer: Self-pay

## 2016-02-19 ENCOUNTER — Encounter
Admission: RE | Admit: 2016-02-19 | Discharge: 2016-02-19 | Disposition: A | Discharge status: Home / Self Care | Payer: Medicare HMO | Source: Ambulatory Visit | Attending: General Surgery | Admitting: General Surgery

## 2016-02-19 NOTE — Telephone Encounter (Signed)
Gayle with pre admit testing at [facility] called to let us know that the patient was seen by Dr Ellison Hughs on 02/16/16 and was diagnosed with pneumonia. He was given antibiotics and an inhaler for treatment. Patient notified that we would have to cancel his surgery for now until he was completely over his pneumonia. Leah in [place] notified.

## 2016-02-19 NOTE — Pre-Procedure Instructions (Signed)
Patient arrived at Pre-Admit Testing to pre-op for surgery with diagnosis of pneumonia from PCP.Patient stated he saw his PCP on Saturday, received a CXR,  placed on an antibiotic, cough medication and given a Ventolin inhaler. Also complain of expectorating  "yellowish sputum and not feeling well. " Caryl-Lyn at Dr. Bary Castilla office made aware of situation and stated she will notify Dr. Bary Castilla.

## 2016-02-20 ENCOUNTER — Telehealth: Payer: Self-pay

## 2016-02-21 NOTE — Telephone Encounter (Signed)
Message left for the patient to call the office back regarding his surgery that was canceled. He will need to be cleared by his physician for the pneumonia and will be able to have surgery about a month after that. He will need to have a pre op appointment here with Dr Bary Castilla.

## 2016-02-25 ENCOUNTER — Encounter: Admission: RE | Payer: Self-pay | Source: Ambulatory Visit

## 2016-02-25 ENCOUNTER — Ambulatory Visit: Admission: RE | Admit: 2016-02-25 | Payer: Medicare HMO | Source: Ambulatory Visit | Admitting: General Surgery

## 2016-02-25 SURGERY — REPAIR, HERNIA, INGUINAL, ADULT
Anesthesia: Choice | Laterality: Left

## 2016-02-25 NOTE — Telephone Encounter (Signed)
Patient called back and notified of requirements for rescheduling his surgery. He expressed understanding and will be in contact with his once he is cleared.

## 2016-03-03 ENCOUNTER — Telehealth: Payer: Self-pay | Admitting: Urology

## 2016-03-03 DIAGNOSIS — N281 Cyst of kidney, acquired: Secondary | ICD-10-CM

## 2016-03-03 NOTE — Telephone Encounter (Signed)
Dr. Tresa Moore wants this patient to follow up with a RUS prior but there is not an order for one. Can you put this in and have him sign it when he gets here today please.   Thanks,  Sharyn Lull

## 2016-03-03 NOTE — Telephone Encounter (Signed)
Orders placed.

## 2016-03-04 ENCOUNTER — Ambulatory Visit (INDEPENDENT_AMBULATORY_CARE_PROVIDER_SITE_OTHER): Payer: Medicare HMO | Admitting: *Deleted

## 2016-03-04 DIAGNOSIS — I639 Cerebral infarction, unspecified: Secondary | ICD-10-CM | POA: Diagnosis not present

## 2016-03-04 NOTE — Progress Notes (Signed)
Carelink Summary Report / Loop Recorder 

## 2016-03-19 ENCOUNTER — Other Ambulatory Visit: Payer: Self-pay

## 2016-03-19 MED ORDER — PRAVASTATIN SODIUM 40 MG PO TABS: 40.0000 mg | ORAL_TABLET | Freq: Every day | ORAL | 9 refills | Status: DC

## 2016-03-22 ENCOUNTER — Other Ambulatory Visit: Payer: Self-pay | Admitting: Cardiovascular Disease

## 2016-03-31 LAB — CUP PACEART REMOTE DEVICE CHECK
Date Time Interrogation Session: 20180312145539
Date Time Interrogation Session: 20180312145747
MDC IDC PG IMPLANT DT: 20150728
MDC IDC PG IMPLANT DT: 20150728

## 2016-04-03 ENCOUNTER — Ambulatory Visit (INDEPENDENT_AMBULATORY_CARE_PROVIDER_SITE_OTHER): Payer: Medicare HMO | Admitting: *Deleted

## 2016-04-03 DIAGNOSIS — I639 Cerebral infarction, unspecified: Secondary | ICD-10-CM

## 2016-04-03 NOTE — Progress Notes (Signed)
Carelink Summary Report / Loop Recorder 

## 2016-04-16 LAB — CUP PACEART REMOTE DEVICE CHECK
Date Time Interrogation Session: 20180328110710
MDC IDC PG IMPLANT DT: 20150728

## 2016-05-05 ENCOUNTER — Ambulatory Visit (INDEPENDENT_AMBULATORY_CARE_PROVIDER_SITE_OTHER): Payer: Medicare HMO | Admitting: *Deleted

## 2016-05-05 DIAGNOSIS — I639 Cerebral infarction, unspecified: Secondary | ICD-10-CM | POA: Diagnosis not present

## 2016-05-05 NOTE — Progress Notes (Signed)
Carelink Summary Report / Loop Recorder 

## 2016-05-17 LAB — CUP PACEART REMOTE DEVICE CHECK
Implantable Pulse Generator Implant Date: 20150728
MDC IDC SESS DTM: 20180414153953

## 2016-05-17 NOTE — Progress Notes (Signed)
Carelink summary report received. Battery status OK. Normal device function. No new symptom episodes, tachy episodes, brady, or pause episodes. No new AF episodes. Monthly summary reports and ROV/PRN 

## 2016-06-02 ENCOUNTER — Ambulatory Visit (INDEPENDENT_AMBULATORY_CARE_PROVIDER_SITE_OTHER): Payer: Medicare HMO | Admitting: *Deleted

## 2016-06-02 DIAGNOSIS — I639 Cerebral infarction, unspecified: Secondary | ICD-10-CM

## 2016-06-02 NOTE — Progress Notes (Signed)
Carelink Summary Report / Loop Recorder 

## 2016-06-06 LAB — CUP PACEART INCLINIC DEVICE CHECK
Date Time Interrogation Session: 20180518140334
MDC IDC PG IMPLANT DT: 20150728

## 2016-06-11 DIAGNOSIS — H8110 Benign paroxysmal vertigo, unspecified ear: Secondary | ICD-10-CM | POA: Diagnosis not present

## 2016-06-11 DIAGNOSIS — I10 Essential (primary) hypertension: Secondary | ICD-10-CM | POA: Diagnosis not present

## 2016-06-11 DIAGNOSIS — E119 Type 2 diabetes mellitus without complications: Secondary | ICD-10-CM | POA: Diagnosis not present

## 2016-06-19 ENCOUNTER — Other Ambulatory Visit: Payer: Self-pay | Admitting: Cardiovascular Disease

## 2016-06-19 NOTE — Telephone Encounter (Signed)
REFILL 

## 2016-07-02 ENCOUNTER — Ambulatory Visit (INDEPENDENT_AMBULATORY_CARE_PROVIDER_SITE_OTHER): Payer: Medicare HMO | Admitting: *Deleted

## 2016-07-02 DIAGNOSIS — I639 Cerebral infarction, unspecified: Secondary | ICD-10-CM

## 2016-07-03 NOTE — Progress Notes (Signed)
Carelink Summary Report / Loop Recorder 

## 2016-07-07 DIAGNOSIS — Z125 Encounter for screening for malignant neoplasm of prostate: Secondary | ICD-10-CM | POA: Diagnosis not present

## 2016-07-07 DIAGNOSIS — J449 Chronic obstructive pulmonary disease, unspecified: Secondary | ICD-10-CM | POA: Diagnosis not present

## 2016-07-07 DIAGNOSIS — I25119 Atherosclerotic heart disease of native coronary artery with unspecified angina pectoris: Secondary | ICD-10-CM | POA: Diagnosis not present

## 2016-07-07 DIAGNOSIS — I10 Essential (primary) hypertension: Secondary | ICD-10-CM | POA: Diagnosis not present

## 2016-07-07 DIAGNOSIS — Z0000 Encounter for general adult medical examination without abnormal findings: Secondary | ICD-10-CM | POA: Diagnosis not present

## 2016-07-07 DIAGNOSIS — E119 Type 2 diabetes mellitus without complications: Secondary | ICD-10-CM | POA: Diagnosis not present

## 2016-07-07 DIAGNOSIS — F325 Major depressive disorder, single episode, in full remission: Secondary | ICD-10-CM | POA: Diagnosis not present

## 2016-07-13 LAB — CUP PACEART REMOTE DEVICE CHECK
Date Time Interrogation Session: 20180613161020
MDC IDC PG IMPLANT DT: 20150728

## 2016-07-13 NOTE — Progress Notes (Signed)
Carelink summary report received. Battery status OK. Normal device function. No new symptom episodes, tachy episodes, brady, or pause episodes. No new AF episodes. Monthly summary reports and ROV/PRN 

## 2016-07-16 ENCOUNTER — Telehealth: Payer: Self-pay | Admitting: Cardiology

## 2016-07-16 NOTE — Telephone Encounter (Signed)
LMOVM requesting that pt send manual transmission b/c home monitor has not updated in at least 14 days.    

## 2016-07-30 ENCOUNTER — Encounter: Payer: Self-pay | Admitting: Cardiology

## 2016-08-01 ENCOUNTER — Encounter: Payer: Medicare HMO | Admitting: *Deleted

## 2016-08-05 ENCOUNTER — Other Ambulatory Visit: Payer: Self-pay | Admitting: *Deleted

## 2016-08-05 MED ORDER — ISOSORBIDE MONONITRATE ER 30 MG PO TB24: ORAL_TABLET | ORAL | 2 refills | Status: DC

## 2016-09-13 DIAGNOSIS — M5442 Lumbago with sciatica, left side: Secondary | ICD-10-CM | POA: Diagnosis not present

## 2016-10-07 ENCOUNTER — Emergency Department: Payer: Medicare HMO

## 2016-10-07 ENCOUNTER — Observation Stay: Payer: Medicare HMO

## 2016-10-07 ENCOUNTER — Encounter: Payer: Self-pay | Admitting: Emergency Medicine

## 2016-10-07 ENCOUNTER — Other Ambulatory Visit: Payer: Self-pay

## 2016-10-07 ENCOUNTER — Observation Stay
Admission: EM | Admit: 2016-10-07 | Discharge: 2016-10-10 | Disposition: A | Discharge status: Home / Self Care | Payer: Medicare HMO | Attending: Internal Medicine | Admitting: Internal Medicine

## 2016-10-07 DIAGNOSIS — Z87442 Personal history of urinary calculi: Secondary | ICD-10-CM | POA: Insufficient documentation

## 2016-10-07 DIAGNOSIS — Z981 Arthrodesis status: Secondary | ICD-10-CM | POA: Insufficient documentation

## 2016-10-07 DIAGNOSIS — I639 Cerebral infarction, unspecified: Secondary | ICD-10-CM | POA: Diagnosis present

## 2016-10-07 DIAGNOSIS — M2578 Osteophyte, vertebrae: Secondary | ICD-10-CM | POA: Insufficient documentation

## 2016-10-07 DIAGNOSIS — I10 Essential (primary) hypertension: Secondary | ICD-10-CM | POA: Insufficient documentation

## 2016-10-07 DIAGNOSIS — Z79899 Other long term (current) drug therapy: Secondary | ICD-10-CM | POA: Insufficient documentation

## 2016-10-07 DIAGNOSIS — I252 Old myocardial infarction: Secondary | ICD-10-CM | POA: Diagnosis not present

## 2016-10-07 DIAGNOSIS — M5001 Cervical disc disorder with myelopathy,  high cervical region: Secondary | ICD-10-CM | POA: Diagnosis not present

## 2016-10-07 DIAGNOSIS — I4510 Unspecified right bundle-branch block: Secondary | ICD-10-CM | POA: Insufficient documentation

## 2016-10-07 DIAGNOSIS — M4712 Other spondylosis with myelopathy, cervical region: Secondary | ICD-10-CM | POA: Diagnosis not present

## 2016-10-07 DIAGNOSIS — Z8673 Personal history of transient ischemic attack (TIA), and cerebral infarction without residual deficits: Secondary | ICD-10-CM | POA: Insufficient documentation

## 2016-10-07 DIAGNOSIS — I081 Rheumatic disorders of both mitral and tricuspid valves: Secondary | ICD-10-CM | POA: Diagnosis not present

## 2016-10-07 DIAGNOSIS — J449 Chronic obstructive pulmonary disease, unspecified: Secondary | ICD-10-CM | POA: Insufficient documentation

## 2016-10-07 DIAGNOSIS — Z7902 Long term (current) use of antithrombotics/antiplatelets: Secondary | ICD-10-CM | POA: Insufficient documentation

## 2016-10-07 DIAGNOSIS — R531 Weakness: Secondary | ICD-10-CM | POA: Diagnosis not present

## 2016-10-07 DIAGNOSIS — G4733 Obstructive sleep apnea (adult) (pediatric): Secondary | ICD-10-CM | POA: Insufficient documentation

## 2016-10-07 DIAGNOSIS — I255 Ischemic cardiomyopathy: Secondary | ICD-10-CM | POA: Diagnosis not present

## 2016-10-07 DIAGNOSIS — Z7982 Long term (current) use of aspirin: Secondary | ICD-10-CM | POA: Insufficient documentation

## 2016-10-07 DIAGNOSIS — M170 Bilateral primary osteoarthritis of knee: Secondary | ICD-10-CM | POA: Diagnosis not present

## 2016-10-07 DIAGNOSIS — I2510 Atherosclerotic heart disease of native coronary artery without angina pectoris: Secondary | ICD-10-CM | POA: Insufficient documentation

## 2016-10-07 DIAGNOSIS — Z23 Encounter for immunization: Secondary | ICD-10-CM | POA: Insufficient documentation

## 2016-10-07 DIAGNOSIS — F17210 Nicotine dependence, cigarettes, uncomplicated: Secondary | ICD-10-CM | POA: Insufficient documentation

## 2016-10-07 DIAGNOSIS — G959 Disease of spinal cord, unspecified: Secondary | ICD-10-CM

## 2016-10-07 DIAGNOSIS — R51 Headache: Secondary | ICD-10-CM | POA: Diagnosis not present

## 2016-10-07 DIAGNOSIS — Z880 Allergy status to penicillin: Secondary | ICD-10-CM | POA: Diagnosis not present

## 2016-10-07 DIAGNOSIS — E782 Mixed hyperlipidemia: Secondary | ICD-10-CM | POA: Insufficient documentation

## 2016-10-07 DIAGNOSIS — Z955 Presence of coronary angioplasty implant and graft: Secondary | ICD-10-CM | POA: Insufficient documentation

## 2016-10-07 DIAGNOSIS — G4700 Insomnia, unspecified: Secondary | ICD-10-CM | POA: Diagnosis not present

## 2016-10-07 DIAGNOSIS — M4802 Spinal stenosis, cervical region: Secondary | ICD-10-CM | POA: Insufficient documentation

## 2016-10-07 DIAGNOSIS — J45909 Unspecified asthma, uncomplicated: Secondary | ICD-10-CM | POA: Insufficient documentation

## 2016-10-07 DIAGNOSIS — M4800 Spinal stenosis, site unspecified: Secondary | ICD-10-CM

## 2016-10-07 HISTORY — DX: Disease of spinal cord, unspecified: G95.9

## 2016-10-07 HISTORY — DX: Cervical disc disorder, unspecified, unspecified cervical region: M50.90

## 2016-10-07 HISTORY — DX: Ischemic cardiomyopathy: I25.5

## 2016-10-07 HISTORY — DX: Cerebral infarction, unspecified: I63.9

## 2016-10-07 LAB — COMPREHENSIVE METABOLIC PANEL
ALT: 16 U/L — ABNORMAL LOW (ref 17–63)
AST: 19 U/L (ref 15–41)
Albumin: 4 g/dL (ref 3.5–5.0)
Alkaline Phosphatase: 113 U/L (ref 38–126)
Anion gap: 8 (ref 5–15)
BILIRUBIN TOTAL: 0.6 mg/dL (ref 0.3–1.2)
BUN: 21 mg/dL — ABNORMAL HIGH (ref 6–20)
CALCIUM: 9.4 mg/dL (ref 8.9–10.3)
CO2: 26 mmol/L (ref 22–32)
Chloride: 102 mmol/L (ref 101–111)
Creatinine, Ser: 0.98 mg/dL (ref 0.61–1.24)
GFR calc non Af Amer: >60 mL/min (ref >60)
Glucose, Bld: 110 mg/dL — ABNORMAL HIGH (ref 65–99)
Potassium: 4.2 mmol/L (ref 3.5–5.1)
Sodium: 136 mmol/L (ref 135–145)
TOTAL PROTEIN: 7.3 g/dL (ref 6.5–8.1)

## 2016-10-07 LAB — CBC
HCT: 35.7 % — ABNORMAL LOW (ref 40.0–52.0)
HEMOGLOBIN: 11.7 g/dL — AB (ref 13.0–18.0)
MCH: 25.1 pg — AB (ref 26.0–34.0)
MCHC: 32.8 g/dL (ref 32.0–36.0)
MCV: 76.7 fL — AB (ref 80.0–100.0)
PLATELETS: 300 10*3/uL (ref 150–440)
RBC: 4.65 MIL/uL (ref 4.40–5.90)
RDW: 16.7 % — ABNORMAL HIGH (ref 11.5–14.5)
WBC: 8.8 10*3/uL (ref 3.8–10.6)

## 2016-10-07 LAB — DIFFERENTIAL
Basophils Absolute: 0.1 10*3/uL (ref 0–0.1)
Basophils Relative: 1 %
EOS PCT: 1 %
Eosinophils Absolute: 0.1 10*3/uL (ref 0–0.7)
LYMPHS ABS: 1.2 10*3/uL (ref 1.0–3.6)
LYMPHS PCT: 14 %
MONO ABS: 0.8 10*3/uL (ref 0.2–1.0)
Monocytes Relative: 10 %
Neutro Abs: 6.5 10*3/uL (ref 1.4–6.5)
Neutrophils Relative %: 74 %

## 2016-10-07 LAB — PROTIME-INR
INR: 1.06
PROTHROMBIN TIME: 13.7 s (ref 11.4–15.2)

## 2016-10-07 LAB — GLUCOSE, CAPILLARY: GLUCOSE-CAPILLARY: 103 mg/dL — AB (ref 65–99)

## 2016-10-07 LAB — TROPONIN I

## 2016-10-07 LAB — APTT: aPTT: 24 seconds (ref 24–36)

## 2016-10-07 MED ORDER — STROKE: EARLY STAGES OF RECOVERY BOOK
Freq: Once | Status: AC
  Administered 2016-10-07: 22:00:00

## 2016-10-07 MED ORDER — PNEUMOCOCCAL VAC POLYVALENT 25 MCG/0.5ML IJ INJ
0.5000 mL | INJECTION | INTRAMUSCULAR | Status: AC
  Administered 2016-10-08: 10:00:00 0.5 mL via INTRAMUSCULAR
  Filled 2016-10-07: qty 0.5

## 2016-10-07 MED ORDER — SODIUM CHLORIDE 0.9 % IV SOLN
INTRAVENOUS | Status: DC
  Administered 2016-10-07: 22:00:00 via INTRAVENOUS

## 2016-10-07 MED ORDER — INFLUENZA VAC SPLIT HIGH-DOSE 0.5 ML IM SUSY
0.5000 mL | PREFILLED_SYRINGE | INTRAMUSCULAR | Status: AC
  Administered 2016-10-08: 10:00:00 0.5 mL via INTRAMUSCULAR
  Filled 2016-10-07: qty 0.5

## 2016-10-07 MED ORDER — ISOSORBIDE MONONITRATE ER 30 MG PO TB24
30.0000 mg | ORAL_TABLET | Freq: Every day | ORAL | Status: DC
  Administered 2016-10-08 – 2016-10-10 (×3): 30 mg via ORAL
  Filled 2016-10-07 (×3): qty 1

## 2016-10-07 MED ORDER — PAROXETINE HCL 20 MG PO TABS
40.0000 mg | ORAL_TABLET | Freq: Every day | ORAL | Status: DC
  Administered 2016-10-08 – 2016-10-10 (×3): 40 mg via ORAL
  Filled 2016-10-07 (×3): qty 2

## 2016-10-07 MED ORDER — ASPIRIN EC 81 MG PO TBEC
81.0000 mg | DELAYED_RELEASE_TABLET | Freq: Every day | ORAL | Status: DC
  Administered 2016-10-08 – 2016-10-10 (×3): 81 mg via ORAL
  Filled 2016-10-07 (×3): qty 1

## 2016-10-07 MED ORDER — IPRATROPIUM-ALBUTEROL 0.5-2.5 (3) MG/3ML IN SOLN
3.0000 mL | Freq: Two times a day (BID) | RESPIRATORY_TRACT | Status: DC
  Administered 2016-10-07 – 2016-10-10 (×6): 3 mL via RESPIRATORY_TRACT
  Filled 2016-10-07 (×6): qty 3

## 2016-10-07 MED ORDER — MOMETASONE FURO-FORMOTEROL FUM 100-5 MCG/ACT IN AERO
2.0000 | INHALATION_SPRAY | Freq: Two times a day (BID) | RESPIRATORY_TRACT | Status: DC
  Administered 2016-10-07 – 2016-10-10 (×6): 2 via RESPIRATORY_TRACT
  Filled 2016-10-07: qty 8.8

## 2016-10-07 MED ORDER — CLOPIDOGREL BISULFATE 75 MG PO TABS
75.0000 mg | ORAL_TABLET | Freq: Every day | ORAL | Status: DC
  Administered 2016-10-08 – 2016-10-09 (×2): 75 mg via ORAL
  Filled 2016-10-07 (×2): qty 1

## 2016-10-07 MED ORDER — RAMIPRIL 10 MG PO CAPS
10.0000 mg | ORAL_CAPSULE | Freq: Every day | ORAL | Status: DC
Start: 2016-10-08 — End: 2016-10-10
  Administered 2016-10-08 – 2016-10-10 (×3): 10 mg via ORAL
  Filled 2016-10-07 (×3): qty 1

## 2016-10-07 MED ORDER — ACETAMINOPHEN 325 MG PO TABS
650.0000 mg | ORAL_TABLET | ORAL | Status: DC | PRN
  Administered 2016-10-09 – 2016-10-10 (×3): 650 mg via ORAL
  Filled 2016-10-07 (×3): qty 2

## 2016-10-07 MED ORDER — ASPIRIN 81 MG PO CHEW
324.0000 mg | CHEWABLE_TABLET | Freq: Once | ORAL | Status: AC
  Administered 2016-10-07: 324 mg via ORAL
  Filled 2016-10-07: qty 4

## 2016-10-07 MED ORDER — ACETAMINOPHEN 160 MG/5ML PO SOLN
650.0000 mg | ORAL | Status: DC | PRN
  Filled 2016-10-07: qty 20.3

## 2016-10-07 MED ORDER — ACETAMINOPHEN 650 MG RE SUPP: 650.0000 mg | RECTAL | Status: DC | PRN

## 2016-10-07 MED ORDER — ENOXAPARIN SODIUM 40 MG/0.4ML ~~LOC~~ SOLN
40.0000 mg | SUBCUTANEOUS | Status: DC
  Administered 2016-10-07 – 2016-10-09 (×3): 40 mg via SUBCUTANEOUS
  Filled 2016-10-07 (×3): qty 0.4

## 2016-10-07 MED ORDER — TRAZODONE HCL 50 MG PO TABS
50.0000 mg | ORAL_TABLET | Freq: Every evening | ORAL | Status: DC | PRN
  Administered 2016-10-07: 100 mg via ORAL
  Administered 2016-10-09: 02:00:00 50 mg via ORAL
  Filled 2016-10-07: qty 2
  Filled 2016-10-07: qty 1

## 2016-10-07 MED ORDER — NITROGLYCERIN 0.4 MG SL SUBL: 0.4000 mg | SUBLINGUAL_TABLET | SUBLINGUAL | Status: DC | PRN

## 2016-10-07 MED ORDER — SENNOSIDES-DOCUSATE SODIUM 8.6-50 MG PO TABS
1.0000 | ORAL_TABLET | Freq: Every evening | ORAL | Status: DC | PRN
Start: 2016-10-07 — End: 2016-10-10

## 2016-10-07 MED ORDER — PRAVASTATIN SODIUM 20 MG PO TABS
40.0000 mg | ORAL_TABLET | Freq: Every day | ORAL | Status: DC
  Administered 2016-10-08 – 2016-10-10 (×3): 40 mg via ORAL
  Filled 2016-10-07 (×3): qty 2

## 2016-10-07 NOTE — ED Provider Notes (Signed)
[facility] Emergency Department Provider Note       Time seen: ----------------------------------------- 5:44 PM on 10/07/2016 -----------------------------------------     I have reviewed the triage vital signs and the nursing notes.   HISTORY   Chief Complaint Dizziness; Weakness (difficulty ambulating/left side weakness); and Headache    HPI Louis Gutierrez is a 66 y.o. male who presents to the ED for balance disturbance, headache and nausea. Patient noticed difficulty walking when he woke up around 6:30 this morning. Patient states he was at work today and his boss noticed that he didn't look for a good so he was encouraged comes to ER for evaluation. Patient reports heaviness in his legs that is worse on the left side. Patient feels weak in the left leg than the right. He also has decreased sensation on the left side of his face, left arm and left leg. At the time my evaluation it is been around 11 hours since he awoke with the symptoms. Patient states last night before he went to bed he was asymptomatic.   Past Medical History:  Diagnosis Date  . Asthma without status asthmaticus   . Atherosclerotic cerebrovascular disease 06/09/2014   Overview:  Small and facial weakness with minimal weakness   . CAD (coronary artery disease)   . Cerebral vascular accident ([place])   . Chronic obstructive pulmonary disease ([place]) 06/09/2014   Last Assessment & Plan:  The patient shortness of breath is generally unchanged and treatment is tolerated.     Marland Kitchen COPD (chronic obstructive pulmonary disease) ([place])   . CVA (cerebral vascular accident) ([place]) 2015  . Depression   . Essential (primary) hypertension 06/09/2014   Last Assessment & Plan:  Patients blood pressure has seemingly been controlled without significant side effects such as dizziness or slow heart rate.     . H/O insomnia   . Hypertension 06/10/2010   ECHO at [facility] - see Media tab  . Mixed hyperlipidemia   .  Nephrolithiasis   . Obesity   . OSA (obstructive sleep apnea)   . Osteoarthritis   . STEMI (ST elevation myocardial infarction) ([place]) 06/07/2010  . Stroke of unknown cause ([place]) 08/06/2013  . Tobacco abuse   . Urinary incontinence   . Varicose veins     Patient Active Problem List   Diagnosis Date Noted  . Left inguinal hernia 02/05/2016  . History of arterial ischemic stroke 01/31/2016  . Left Adrenal cortical adenoma 10/16/2015  . Nephrolithiasis 10/16/2015  . Prostate cancer screening 10/16/2015  . Renal cysts, congenital, bilateral 10/16/2015  . Microscopic hematuria 10/16/2015  . 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 Cervical pain 12/08/2014  . Atherosclerotic cerebrovascular disease 06/09/2014  . Chronic obstructive pulmonary disease ([place]) 06/09/2014  . CAD in native artery 06/09/2014  . Essential (primary) hypertension 06/09/2014  . Blood glucose elevated 06/09/2014  . Major depression in remission ([place]) 06/09/2014  . 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 Chondrocalcinosis of knee 04/20/2014  . Primary osteoarthritis of both knees 04/20/2014  . Status post placement of implantable loop recorder 08/24/2013  . Stroke of unknown cause ([place]) 08/06/2013  . Tobacco abuse 06/30/2012  . COPD (chronic obstructive pulmonary disease) ([place]) 06/30/2012  . OSA (obstructive sleep apnea)   . CAD (coronary artery disease)   . Mixed hyperlipidemia   . Obesity   . Hypertension 06/10/2010  . STEMI (ST elevation myocardial infarction) ([place]) 06/07/2010    Past Surgical History:  Procedure  Laterality Date  . BACK SURGERY  2003   C3C4  . CARDIAC CATHETERIZATION  06/07/2010   50 % ostial narrowing in 1st diagonal from LAD, 2nd diagonal had 40% ostial narrowing; AV groove circumflex was diminutive and occluded proximally; RCA totally occluded in mid segment - stented with 3.5 x 33mm Promus Element DES  - See Media tab  . CARDIAC  CATHETERIZATION  05/2013   [place]  . COLONOSCOPY WITH PROPOFOL N/A 02/26/2015   Procedure: COLONOSCOPY WITH PROPOFOL;  Surgeon: Josefine Class, MD;  Location: [facility] ENDOSCOPY;  Service: Endoscopy;  Laterality: N/A;  . ESOPHAGOGASTRODUODENOSCOPY  02/26/2015   Procedure: ESOPHAGOGASTRODUODENOSCOPY (EGD);  Surgeon: Josefine Class, MD;  Location: [facility] ENDOSCOPY;  Service: Endoscopy;;  . LOOP RECORDER IMPLANT  08/16/13   Dr. Sallyanne Kuster    Allergies Penicillins  Social History Social History  Substance Use Topics  . Smoking status: Current Every Day Smoker    Packs/day: 1.00    Years: 53.00    Types: Cigarettes  . Smokeless tobacco: Never Used  . Alcohol use No    Review of Systems Constitutional: Negative for fever. Eyes: Negative for vision changes ENT:  Negative for congestion, sore throat Cardiovascular: Negative for chest pain. Respiratory: Negative for shortness of breath. Gastrointestinal: Negative for abdominal pain, positive for nausea Genitourinary: Negative for dysuria. Musculoskeletal: Negative for back pain. Skin: Negative for rash. Neurological:positive for weakness, ataxia  All systems negative/normal/unremarkable except as stated in the HPI  ____________________________________________   PHYSICAL EXAM:  VITAL SIGNS: ED Triage Vitals [10/07/16 1703]  Enc Vitals Group     BP 124/70     Pulse Rate 82     Resp 19     Temp 97.8 F (36.6 C)     Temp Source Oral     SpO2 97 %     Weight 213 lb (96.6 kg)     Height 5\' 11"  (1.803 m)     Head Circumference      Peak Flow      Pain Score      Pain Loc      Pain Edu?      Excl. in [place]?     Constitutional: Alert and oriented. Well appearing and in no distress. Eyes: Conjunctivae are normal. Normal extraocular movements. ENT   Head: Normocephalic and atraumatic.   Nose: No congestion/rhinnorhea.   Mouth/Throat: Mucous membranes are moist.   Neck: No stridor. Cardiovascular: Normal rate,  regular rhythm. No murmurs, rubs, or gallops. Respiratory: Normal respiratory effort without tachypnea nor retractions. Breath sounds are clear and equal bilaterally. No wheezes/rales/rhonchi. Gastrointestinal: Soft and nontender. Normal bowel sounds Musculoskeletal: Nontender with normal range of motion in extremities. No lower extremity tenderness nor edema. Neurologic:  Normal speech and language. decreased sensation on the left side of the face compared to right, otherwise cranial nerves appear to be intact. Left arm and left leg paresthesias. The right. There does appear to be left leg weakness compared to right. Finger to nose testing is normal Skin:  Skin is warm, dry and intact. No rash noted. Psychiatric: Mood and affect are normal. Speech and behavior are normal.  ____________________________________________  EKG: Interpreted by me.sinus rhythm rate 83 bpm, incomplete right bundle branch block, normal QRS, normal QT.  ____________________________________________  ED COURSE:  Pertinent labs & imaging results that were available during my care of the patient were reviewed by me and considered in my medical decision making (see chart for details). Patient presents for neurologic symptoms that started  this morning, we will assess with labs and imaging as indicated.physical exam likely indicate CVA however onset was almost 12 hours ago. Patient last known well around 18 hours ago   Procedures ____________________________________________   LABS (pertinent positives/negatives)  Labs Reviewed  CBC - Abnormal; Notable for the following:       Result Value   Hemoglobin 11.7 (*)    HCT 35.7 (*)    MCV 76.7 (*)    MCH 25.1 (*)    RDW 16.7 (*)    All other components within normal limits  COMPREHENSIVE METABOLIC PANEL - Abnormal; Notable for the following:    Glucose, Bld 110 (*)    BUN 21 (*)    ALT 16 (*)    All other components within normal limits  GLUCOSE, CAPILLARY - Abnormal;  Notable for the following:    Glucose-Capillary 103 (*)    All other components within normal limits  DIFFERENTIAL  TROPONIN I  PROTIME-INR  APTT  CBG MONITORING, ED   CRITICAL CARE Performed by: Earleen Newport   Total critical care time: 30 minutes  Critical care time was exclusive of separately billable procedures and treating other patients.  Critical care was necessary to treat or prevent imminent or life-threatening deterioration.  Critical care was time spent personally by me on the following activities: development of treatment plan with patient and/or surrogate as well as nursing, discussions with consultants, evaluation of patient's response to treatment, examination of patient, obtaining history from patient or surrogate, ordering and performing treatments and interventions, ordering and review of laboratory studies, ordering and review of radiographic studies, pulse oximetry and re-evaluation of patient's condition.  RADIOLOGY Images were viewed by me  CT head IMPRESSION: No acute intracranial abnormality by noncontrast CT.  Remote left basal ganglia lacunar-type infarct as before ____________________________________________  FINAL ASSESSMENT AND PLAN  CVA   Plan: Patient's labs and imaging were dictated above. Patient had presented for ataxia and does have some left-sided weakness and decreased sensation. Initial CT scan was unremarkable for acute infarct but he will likely need MRI and further inpatient evaluation. I will discuss with the hospitalist for admission.   Earleen Newport, MD   Note: This note was generated in part or whole with voice recognition software. Voice recognition is usually quite accurate but there are transcription errors that can and very often do occur. I apologize for any typographical errors that were not detected and corrected.     Earleen Newport, MD 10/07/16 [phone]

## 2016-10-07 NOTE — ED Notes (Addendum)
Pt states this AM around 6:30 felt unsteady on feet, states "I was walking like I was drunk." pt states he drove himself here from work. States HA at forehead. Denies blurred vision. States L sided sensation is less than R. L leg is weaker than R. No arm drift, no facial droop. No slurred speech noted. Pt states bilat legs feel "heavy" and states cramping x few days.   States stroke 3 years, was seen here for it. No deficits. Takes plavix. States MI with 1 stent 5 years ago. Denies drinking, states smoker.   Pt states when he had his stroke he had driven himself home and wife said he was "talking out of his head and wasn't making sense."

## 2016-10-07 NOTE — ED Notes (Signed)
Pt transported to room 104C

## 2016-10-07 NOTE — ED Triage Notes (Signed)
Pt presents to ED c/o dizziness, headache with nausea, and difficulty ambulating first noticed around 630 am. Pt states he was working and his boss stated "he didn't look to good" so pt came in. Pt complains "my legs are heavy". A/O x4.

## 2016-10-07 NOTE — ED Triage Notes (Signed)
FIRST NURSE NOTE-woke with headache and dizziness today. Hx stroke and diabetic. Sx were upon waking.

## 2016-10-07 NOTE — H&P (Signed)
Louis Gutierrez is an 66 y.o. male.    Chief Complaint: Weakness HPI: The patient with past medical history of CVA, coronary artery disease status post MI, hypertension, and obstructive sleep apnea presents to the emergency department with left-sided weakness. The patient states that he has also felt as if he may fall over when trying to walk. He awoke this morning with these symptoms yet went to work anyway. The severity of his symptoms gradually improved but he still felt as if his equilibrium was off. He has had an intermittent headache throughout the day. He denies chest pain, shortness of breath, difficulty swallowing or speaking. He admits that his left leg felt heavy and slightly numb earlier today. He also notes that his left hand feels weak. In the emergency department CT of his head was negative but due to his persistent symptoms emergency department staff called the hospitalist service for admission.  Past Medical History:  Diagnosis Date  . Asthma without status asthmaticus   . Atherosclerotic cerebrovascular disease 06/09/2014   Overview:  Small and facial weakness with minimal weakness   . CAD (coronary artery disease)   . Cerebral vascular accident ([place])   . Chronic obstructive pulmonary disease ([place]) 06/09/2014   Last Assessment & Plan:  The patient shortness of breath is generally unchanged and treatment is tolerated.     Marland Kitchen COPD (chronic obstructive pulmonary disease) ([place])   . CVA (cerebral vascular accident) ([place]) 2015  . Depression   . Essential (primary) hypertension 06/09/2014   Last Assessment & Plan:  Patients blood pressure has seemingly been controlled without significant side effects such as dizziness or slow heart rate.     . H/O insomnia   . Hypertension 06/10/2010   ECHO at [facility] - see Media tab  . Mixed hyperlipidemia   . Nephrolithiasis   . Obesity   . OSA (obstructive sleep apnea)   . Osteoarthritis   . STEMI (ST elevation myocardial infarction) ([place]) 06/07/2010  .  Stroke of unknown cause ([place]) 08/06/2013  . Tobacco abuse   . Urinary incontinence   . Varicose veins     Past Surgical History:  Procedure Laterality Date  . BACK SURGERY  2003   C3C4  . CARDIAC CATHETERIZATION  06/07/2010   50 % ostial narrowing in 1st diagonal from LAD, 2nd diagonal had 40% ostial narrowing; AV groove circumflex was diminutive and occluded proximally; RCA totally occluded in mid segment - stented with 3.5 x 28m Promus Element DES  - See Media tab  . CARDIAC CATHETERIZATION  05/2013   [place]  . COLONOSCOPY WITH PROPOFOL N/A 02/26/2015   Procedure: COLONOSCOPY WITH PROPOFOL;  Surgeon: MJosefine Class MD;  [facility];  Service: Endoscopy;  Laterality: N/A;  . ESOPHAGOGASTRODUODENOSCOPY  02/26/2015   Procedure: ESOPHAGOGASTRODUODENOSCOPY (EGD);  Surgeon: MJosefine Class MD;  [facility];  Service: Endoscopy;;  . LOOP RECORDER IMPLANT  08/16/13   Dr. CSallyanne Kuster   Family History  Problem Relation Age of Onset  . Cancer Mother        oral  . Stroke Maternal Grandmother   . Diabetes Brother   . Coronary artery disease Brother    Social History:  reports that he has been smoking Cigarettes.  He has a 53.00 pack-year smoking history. He has never used smokeless tobacco. He reports that he does not drink alcohol or use drugs.  Allergies:  Allergies  Allergen Reactions  . Penicillins Hives    Has patient had a  PCN reaction causing immediate rash, facial/tongue/throat swelling, SOB or lightheadedness with hypotension: No Has patient had a PCN reaction causing severe rash involving mucus membranes or skin necrosis: No Has patient had a PCN reaction that required hospitalization No Has patient had a PCN reaction occurring within the last 10 years: No If all of the above answers are "NO", then may proceed with Cephalosporin use.     Prior to Admission medications   Medication Sig Start Date End Date Taking? Authorizing Provider   acetaminophen (TYLENOL) 500 MG tablet Take 1,000 mg by mouth every 8 (eight) hours as needed for mild pain.    Yes [provider]  ADVAIR DISKUS 100-50 MCG/DOSE AEPB Inhale 1 puff into the lungs 2 (two) times daily. 01/02/16  Yes [provider]  aspirin EC 81 MG tablet Take 81 mg by mouth daily.   Yes [provider]  clopidogrel (PLAVIX) 75 MG tablet TAKE ONE (1) TABLET EACH DAY 06/19/16  Yes Croitoru, Mihai, MD  COMBIVENT RESPIMAT 20-100 MCG/ACT AERS respimat Inhale 1 puff into the lungs 2 (two) times daily. 01/02/16  Yes [provider]  isosorbide mononitrate (IMDUR) 30 MG 24 hr tablet TAKE ONE (1) TABLET EACH DAY 08/05/16  Yes Croitoru, Mihai, MD  nitroGLYCERIN (NITROSTAT) 0.4 MG SL tablet Place 0.4 mg under the tongue every 5 (five) minutes as needed for chest pain.  04/18/14  Yes [provider]  PARoxetine (PAXIL) 40 MG tablet Take 40 mg by mouth. 11/16/13  Yes [provider]  pravastatin (PRAVACHOL) 40 MG tablet Take 1 tablet (40 mg total) by mouth daily. 03/19/16  Yes Croitoru, Mihai, MD  ramipril (ALTACE) 10 MG capsule TAKE ONE (1) CAPSULE EACH DAY 03/24/16  Yes Croitoru, Mihai, MD  traZODone (DESYREL) 100 MG tablet Take 50-100 mg by mouth at bedtime as needed for sleep.    Yes [provider]     Results for orders placed or performed during the hospital encounter of 10/07/16 (from the past 48 hour(s))  CBC     Status: Abnormal   Collection Time: 10/07/16  5:31 PM  Result Value Ref Range   WBC 8.8 3.8 - 10.6 K/uL   RBC 4.65 4.40 - 5.90 MIL/uL   Hemoglobin 11.7 (L) 13.0 - 18.0 g/dL   HCT 35.7 (L) 40.0 - 52.0 %   MCV 76.7 (L) 80.0 - 100.0 fL   MCH 25.1 (L) 26.0 - 34.0 pg   MCHC 32.8 32.0 - 36.0 g/dL   RDW 16.7 (H) 11.5 - 14.5 %   Platelets 300 150 - 440 K/uL  Differential     Status: None   Collection Time: 10/07/16  5:31 PM  Result Value Ref Range   Neutrophils Relative % 74 %   Neutro Abs 6.5 1.4 - 6.5 K/uL    Lymphocytes Relative 14 %   Lymphs Abs 1.2 1.0 - 3.6 K/uL   Monocytes Relative 10 %   Monocytes Absolute 0.8 0.2 - 1.0 K/uL   Eosinophils Relative 1 %   Eosinophils Absolute 0.1 0 - 0.7 K/uL   Basophils Relative 1 %   Basophils Absolute 0.1 0 - 0.1 K/uL  Comprehensive metabolic panel     Status: Abnormal   Collection Time: 10/07/16  5:31 PM  Result Value Ref Range   Sodium 136 135 - 145 mmol/L   Potassium 4.2 3.5 - 5.1 mmol/L   Chloride 102 101 - 111 mmol/L   CO2 26 22 - 32 mmol/L   Glucose, Bld  110 (H) 65 - 99 mg/dL   BUN 21 (H) 6 - 20 mg/dL   Creatinine, Ser 0.98 0.61 - 1.24 mg/dL   Calcium 9.4 8.9 - 10.3 mg/dL   Total Protein 7.3 6.5 - 8.1 g/dL   Albumin 4.0 3.5 - 5.0 g/dL   AST 19 15 - 41 U/L   ALT 16 (L) 17 - 63 U/L   Alkaline Phosphatase 113 38 - 126 U/L   Total Bilirubin 0.6 0.3 - 1.2 mg/dL   GFR calc non Af Amer >60 >60 mL/min   GFR calc Af Amer >60 >60 mL/min    Comment: (NOTE) The eGFR has been calculated using the CKD EPI equation. This calculation has not been validated in all clinical situations. eGFR's persistently <60 mL/min signify possible Chronic Kidney Disease.    Anion gap 8 5 - 15  Troponin I     Status: None   Collection Time: 10/07/16  5:31 PM  Result Value Ref Range   Troponin I <0.03 <0.03 ng/mL  Glucose, capillary     Status: Abnormal   Collection Time: 10/07/16  5:51 PM  Result Value Ref Range   Glucose-Capillary 103 (H) 65 - 99 mg/dL  Protime-INR     Status: None   Collection Time: 10/07/16  6:20 PM  Result Value Ref Range   Prothrombin Time 13.7 11.4 - 15.2 seconds   INR 1.06   APTT     Status: None   Collection Time: 10/07/16  6:20 PM  Result Value Ref Range   aPTT 24 24 - 36 seconds   Ct Head Wo Contrast  Result Date: 10/07/2016 CLINICAL DATA:  Severe headache EXAM: CT HEAD WITHOUT CONTRAST TECHNIQUE: Contiguous axial images were obtained from the base of the skull through the vertex without intravenous contrast. COMPARISON:   12/05/2013 FINDINGS: Brain: Remote left basal ganglia infarct as before. No acute intracranial hemorrhage, mass lesion, new infarction, midline shift, herniation, hydrocephalus, or extra-axial fluid collection. No focal mass effect or edema. Cisterns are patent. No cerebellar abnormality. Vascular: No hyperdense vessel or unexpected calcification. Skull: Normal. Negative for fracture or focal lesion. Sinuses/Orbits: No acute finding. Other: None. IMPRESSION: No acute intracranial abnormality by noncontrast CT. Remote left basal ganglia lacunar-type infarct as before Electronically Signed   By: Jerilynn Mages.  Shick M.D.   On: 10/07/2016 17:43    Review of Systems  Constitutional: Negative for chills and fever.  HENT: Negative for sore throat and tinnitus.   Eyes: Negative for blurred vision and redness.  Respiratory: Negative for cough and shortness of breath.   Cardiovascular: Negative for chest pain, palpitations, orthopnea and PND.  Gastrointestinal: Negative for abdominal pain, diarrhea, nausea and vomiting.  Genitourinary: Negative for dysuria, frequency and urgency.  Musculoskeletal: Negative for joint pain and myalgias.  Skin: Negative for rash.       No lesions  Neurological: Positive for focal weakness. Negative for speech change and weakness.       Disequilibrium   Endo/Heme/Allergies: Does not bruise/bleed easily.       No temperature intolerance  Psychiatric/Behavioral: Negative for depression and suicidal ideas.    Blood pressure 113/78, pulse 64, temperature 98.5 F (36.9 C), resp. rate (!) 23, height 5' 11"  (1.803 m), weight 96.6 kg (213 lb), SpO2 94 %. Physical Exam  Vitals reviewed. Constitutional: He is oriented to person, place, and time. He appears well-developed and well-nourished. No distress.  HENT:  Head: Normocephalic and atraumatic.  Mouth/Throat: Oropharynx is clear and moist.  Eyes: Pupils  are equal, round, and reactive to light. Conjunctivae and EOM are normal. No  scleral icterus.  Neck: Normal range of motion. Neck supple. No JVD present. Carotid bruit is not present. No tracheal deviation present. No thyromegaly present.  Cardiovascular: Normal rate, regular rhythm and normal heart sounds.  Exam reveals no gallop and no friction rub.   No murmur heard. GI: Soft. Bowel sounds are normal. He exhibits no distension. There is no tenderness.  Genitourinary:  Genitourinary Comments: Deferred  Musculoskeletal: Normal range of motion. He exhibits no edema.  Lymphadenopathy:    He has no cervical adenopathy.  Neurological: He is alert and oriented to person, place, and time. No cranial nerve deficit.  Decreased left hand grip strength  Skin: Skin is warm and dry. No rash noted. No erythema.  Psychiatric: He has a normal mood and affect. His behavior is normal. Judgment and thought content normal.     Assessment/Plan This is a 66 year old male admitted for cerebrovascular accident. 1. Stroke: Likely ischemic; CT head negative. MRI/MRA brain ordered for tomorrow. Has persistent left decreased grip strength. Permissible hypertension for now. The patient has an implantable loop recorder that is MRI safe. Consult neurology 2. Hypertension: Continue antihypertensive medication per home regimen starting in the morning 3. CAD: Stable; continue aspirin and Plavix. Imdur per home regimen. 4. OSA: The patient does not consistently wear CPAP at home which contributes to his risk for stroke. He agrees to use CPAP hospitalized. 5. COPD: Continue inhaled corticosteroid 6. DVT prophylaxis: Lovenox 7. GI prophylaxis: None The patient is a full code. Time spent on admission orders and patient care approximately 45 minutes   Harrie Foreman, MD 10/07/2016, 9:01 PM

## 2016-10-07 NOTE — ED Notes (Signed)
Wife talking to pt on the phone.

## 2016-10-07 NOTE — ED Notes (Signed)
Kate RN, aware of bed assigned  

## 2016-10-08 ENCOUNTER — Observation Stay: Payer: Medicare HMO

## 2016-10-08 ENCOUNTER — Observation Stay (HOSPITAL_BASED_OUTPATIENT_CLINIC_OR_DEPARTMENT_OTHER)
Admit: 2016-10-08 | Discharge: 2016-10-08 | Disposition: A | Discharge status: Home / Self Care | Payer: Medicare HMO | Attending: Internal Medicine | Admitting: Internal Medicine

## 2016-10-08 DIAGNOSIS — R531 Weakness: Secondary | ICD-10-CM | POA: Diagnosis not present

## 2016-10-08 DIAGNOSIS — M4802 Spinal stenosis, cervical region: Secondary | ICD-10-CM | POA: Diagnosis not present

## 2016-10-08 DIAGNOSIS — I2510 Atherosclerotic heart disease of native coronary artery without angina pectoris: Secondary | ICD-10-CM | POA: Diagnosis not present

## 2016-10-08 DIAGNOSIS — I639 Cerebral infarction, unspecified: Secondary | ICD-10-CM | POA: Diagnosis not present

## 2016-10-08 DIAGNOSIS — G459 Transient cerebral ischemic attack, unspecified: Secondary | ICD-10-CM | POA: Diagnosis not present

## 2016-10-08 DIAGNOSIS — I340 Nonrheumatic mitral (valve) insufficiency: Secondary | ICD-10-CM | POA: Diagnosis not present

## 2016-10-08 DIAGNOSIS — I10 Essential (primary) hypertension: Secondary | ICD-10-CM | POA: Diagnosis not present

## 2016-10-08 LAB — ECHOCARDIOGRAM COMPLETE
Height: 71 in
Weight: 3364 oz

## 2016-10-08 LAB — LIPID PANEL
Cholesterol: 116 mg/dL (ref 0–200)
HDL: 37 mg/dL — ABNORMAL LOW (ref >40)
LDL Cholesterol: 62 mg/dL (ref 0–99)
Total CHOL/HDL Ratio: 3.1 RATIO
Triglycerides: 87 mg/dL (ref <150)
VLDL: 17 mg/dL (ref 0–40)

## 2016-10-08 LAB — HEMOGLOBIN A1C
Hgb A1c MFr Bld: 6.2 % — ABNORMAL HIGH (ref 4.8–5.6)
Mean Plasma Glucose: 131.24 mg/dL

## 2016-10-08 NOTE — Progress Notes (Signed)
SLP Cancellation Note  Patient Details Name: Louis Gutierrez MRN: [id number] DOB: [birth year]   Cancelled treatment:       Reason Eval/Treat Not Completed: SLP screened, no needs identified, will sign off (Chart reviewed; NSG consulted; Pt met w/)  Pt denied any difficulty swallowing and is currently on a regular diet; tolerates swallowing pills w/ water per NSG. Pt was on phone having a conversation when [place] student entered room. Pt conversed at conversational level w/out deficits noted; pt denied any speech-language deficits.  No further skilled ST services indicated as pt appears at his baseline. Pt agreed. NSG to reconsult if any change in status.   Carolynn Sayers, SLP-Graduate Student Carolynn Sayers 10/08/2016, 11:53 AM   This information has been reviewed and agreed upon by this supervising clinician.  This patient note, response to treatment and overall treatment plan has been reviewed and this clinician agrees with the information provided.  10/08/16, 1:07 PM [place], [place], CCC-SLP

## 2016-10-08 NOTE — Evaluation (Addendum)
Physical Therapy Evaluation Patient Details Name: Louis Gutierrez MRN: [id number] DOB: Jun 16, 1950 Today's Date: 10/08/2016   History of Present Illness  Pt admitted for possible CVA this date, however all imaging returning negative. Pt with complaints of L side weakness. History of CVA and CAD s/p MI along with HTN.   Clinical Impression  Pt is a pleasant 66 year old male who was admitted for possible CVA. Pt performs bed mobility with independence, transfers with mod I, and ambulation with cga and no AD. Pt with normal coordination/sensation testing along with normal RAMPS. Pt demonstrates deficits with balance/weakness on L LE. Encouraged pt to have staff in room when mobilizing for safety. Does not require need for AD at this time, no falls history. Appears to be close to baseline level at this time. Would benefit from skilled PT to address above deficits and promote optimal return to PLOF. Recommend OP PT for follow up and management of strength.      Follow Up Recommendations Outpatient PT    Equipment Recommendations  None recommended by PT    Recommendations for Other Services       Precautions / Restrictions Precautions Precautions: Fall Restrictions Weight Bearing Restrictions: No      Mobility  Bed Mobility Overal bed mobility: Independent             General bed mobility comments: safe technique performed with upright posture noted. No dizziness noted with transition to EOB  Transfers Overall transfer level: Modified independent Equipment used: None             General transfer comment: safe technique with ability to push from seated position prior to standing. Slight unsteadiness noted while standing at EOB, however no overt LOB noted  Ambulation/Gait Ambulation/Gait assistance: Min guard Ambulation Distance (Feet): 80 Feet Assistive device: None Gait Pattern/deviations: Step-through pattern;Decreased step length - left;Narrow base of support      General Gait Details: ambulated to RN station and back to room. Slight unsteadiness during turns and noted with L side short step length compared to R side. No formal LOB noted  Stairs            Wheelchair Mobility    Modified Rankin (Stroke Patients Only)       Balance Overall balance assessment: Needs assistance Sitting-balance support: Feet supported;No upper extremity supported Sitting balance-Leahy Scale: Normal     Standing balance support: During functional activity Standing balance-Leahy Scale: Good Standing balance comment: slight unsteadiness with decreased step length noted                             Pertinent Vitals/Pain Pain Assessment: No/denies pain    Home Living Family/patient expects to be discharged to:: Private residence Living Arrangements: Spouse/significant other;Children Available Help at Discharge: Family Type of Home: House Home Access: Level entry     Home Layout: One level (1 step to go from mud room to kitchen; no rails) Home Equipment: None      Prior Function Level of Independence: Independent         Comments: was indep with all ADLs, working as a Scientist, water quality        Extremity/Trunk Assessment   Upper Extremity Assessment Upper Extremity Assessment: Overall WFL for tasks assessed    Lower Extremity Assessment Lower Extremity Assessment: Generalized weakness (L LE grossly 4+/5)       Communication  Communication: No difficulties  Cognition Arousal/Alertness: Awake/alert Behavior During Therapy: WFL for tasks assessed/performed Overall Cognitive Status: Within Functional Limits for tasks assessed                                        General Comments      Exercises Other Exercises Other Exercises: Seated ther-ex performed x 10 reps including L LE LAQ, B LE alt marching, and sit<>stands with using hands on recliner. Safe technique  performed with cues for correct sequencing. Educated on frequency and duration.   Assessment/Plan    PT Assessment Patient needs continued PT services  PT Problem List Decreased strength;Decreased balance;Decreased mobility       PT Treatment Interventions Gait training;Balance training    PT Goals (Current goals can be found in the Care Plan section)  Acute Rehab PT Goals Patient Stated Goal: to get L LE stronger PT Goal Formulation: With patient Time For Goal Achievement: 10/22/16 Potential to Achieve Goals: Good    Frequency Min 2X/week   Barriers to discharge        Co-evaluation               AM-PAC PT "6 Clicks" Daily Activity  Outcome Measure Difficulty turning over in bed (including adjusting bedclothes, sheets and blankets)?: None Difficulty moving from lying on back to sitting on the side of the bed? : None Difficulty sitting down on and standing up from a chair with arms (e.g., wheelchair, bedside commode, etc,.)?: None Help needed moving to and from a bed to chair (including a wheelchair)?: None Help needed walking in hospital room?: None Help needed climbing 3-5 steps with a railing? : A Little 6 Click Score: 23    End of Session Equipment Utilized During Treatment: Gait belt Activity Tolerance: Patient tolerated treatment well Patient left: in chair (pt is mod fall risk- RN notified) Nurse Communication: Mobility status PT Visit Diagnosis: Unsteadiness on feet (R26.81);Muscle weakness (generalized) (M62.81)    Time: 9381-8299 PT Time Calculation (min) (ACUTE ONLY): 19 min   Charges:   PT Evaluation $PT Eval Low Complexity: 1 Low PT Treatments $Therapeutic Exercise: 8-22 mins   PT G Codes:   PT G-Codes **NOT FOR INPATIENT CLASS** Functional Assessment Tool Used: AM-PAC 6 Clicks Basic Mobility Functional Limitation: Mobility: Walking and moving around Mobility: Walking and Moving Around Current Status (B7169): At least 1 percent but less than  20 percent impaired, limited or restricted Mobility: Walking and Moving Around Goal Status [phone]): 0 percent impaired, limited or restricted    Greggory Stallion, PT, DPT [phone]   Zakhari Fogel 10/08/2016, 11:10 AM

## 2016-10-08 NOTE — Progress Notes (Signed)
*  PRELIMINARY RESULTS* Echocardiogram 2D Echocardiogram has been performed.  Louis Gutierrez 10/08/2016, 9:36 AM

## 2016-10-08 NOTE — Care Management (Signed)
patient admitted from home with sx concerning for stroke.  PT, OT and neuro consults pending.  Brain MRI negative for acute findings.  Independent in all adls, denies issues accessing medical care, obtaining medications or with transportation.  Current with  PCP.

## 2016-10-08 NOTE — Progress Notes (Signed)
Louis Gutierrez NAME: Louis Gutierrez    MR#:  [id number]  DATE OF BIRTH:  [birth year]  SUBJECTIVE:  CHIEF COMPLAINT:  Patient is resting comfortably. Denies any headache or blurry vision. Denies any speech difficulty. Denies any extremity weaknesS during my examination. Past bedside swallow evaluation  REVIEW OF SYSTEMS:  CONSTITUTIONAL: No fever, fatigue or weakness.  EYES: No blurred or double vision.  EARS, NOSE, AND THROAT: No tinnitus or ear pain.  RESPIRATORY: No cough, shortness of breath, wheezing or hemoptysis.  CARDIOVASCULAR: No chest pain, orthopnea, edema.  GASTROINTESTINAL: No nausea, vomiting, diarrhea or abdominal pain.  GENITOURINARY: No dysuria, hematuria.  ENDOCRINE: No polyuria, nocturia,  HEMATOLOGY: No anemia, easy bruising or bleeding SKIN: No rash or lesion. MUSCULOSKELETAL: No joint pain or arthritis.   NEUROLOGIC: No tingling, numbness, weakness.  PSYCHIATRY: No anxiety or depression.   DRUG ALLERGIES:   Allergies  Allergen Reactions  . Penicillins Hives    Has patient had a PCN reaction causing immediate rash, facial/tongue/throat swelling, SOB or lightheadedness with hypotension: No Has patient had a PCN reaction causing severe rash involving mucus membranes or skin necrosis: No Has patient had a PCN reaction that required hospitalization No Has patient had a PCN reaction occurring within the last 10 years: No If all of the above answers are "NO", then may proceed with Cephalosporin use.     VITALS:  Blood pressure 100/61, pulse 70, temperature (!) 97.4 F (36.3 C), temperature source Oral, resp. rate 16, height 5\' 11"  (1.803 m), weight 95.4 kg (210 lb 4 oz), SpO2 95 %.  PHYSICAL EXAMINATION:  GENERAL:  66 y.o.-year-old patient lying in the bed with no acute distress.  EYES: Pupils equal, round, reactive to light and accommodation. No scleral icterus. Extraocular muscles intact.  HEENT: Head  atraumatic, normocephalic. Oropharynx and nasopharynx clear.  NECK:  Supple, no jugular venous distention. No thyroid enlargement, no tenderness.  LUNGS: Normal breath sounds bilaterally, no wheezing, rales,rhonchi or crepitation. No use of accessory muscles of respiration.  CARDIOVASCULAR: S1, S2 normal. No murmurs, rubs, or gallops.  ABDOMEN: Soft, nontender, nondistended. Bowel sounds present. No organomegaly or mass.  EXTREMITIES: No pedal edema, cyanosis, or clubbing.  NEUROLOGIC: Cranial nerves II through XII are intact. Muscle strength 5/5 in all extremities. Sensation intact. Gait not checked.  PSYCHIATRIC: The patient is alert and oriented x 3.  SKIN: No obvious rash, lesion, or ulcer.    LABORATORY PANEL:   CBC  Recent Labs Lab 10/07/16 1731  WBC 8.8  HGB 11.7*  HCT 35.7*  PLT 300   ------------------------------------------------------------------------------------------------------------------  Chemistries   Recent Labs Lab 10/07/16 1731  NA 136  K 4.2  CL 102  CO2 26  GLUCOSE 110*  BUN 21*  CREATININE 0.98  CALCIUM 9.4  AST 19  ALT 16*  ALKPHOS 113  BILITOT 0.6   ------------------------------------------------------------------------------------------------------------------  Cardiac Enzymes  Recent Labs Lab 10/07/16 1731  TROPONINI <0.03   ------------------------------------------------------------------------------------------------------------------  RADIOLOGY:  Ct Head Wo Contrast  Result Date: 10/07/2016 CLINICAL DATA:  Severe headache EXAM: CT HEAD WITHOUT CONTRAST TECHNIQUE: Contiguous axial images were obtained from the base of the skull through the vertex without intravenous contrast. COMPARISON:  12/05/2013 FINDINGS: Brain: Remote left basal ganglia infarct as before. No acute intracranial hemorrhage, mass lesion, new infarction, midline shift, herniation, hydrocephalus, or extra-axial fluid collection. No focal mass effect or edema.  Cisterns are patent. No cerebellar abnormality. Vascular: No hyperdense vessel or unexpected calcification.  Skull: Normal. Negative for fracture or focal lesion. Sinuses/Orbits: No acute finding. Other: None. IMPRESSION: No acute intracranial abnormality by noncontrast CT. Remote left basal ganglia lacunar-type infarct as before Electronically Signed   By: Jerilynn Mages.  Shick M.D.   On: 10/07/2016 17:43   Mr Brain Wo Contrast  Result Date: 10/07/2016 CLINICAL DATA:  Left-sided weakness EXAM: MRI HEAD WITHOUT CONTRAST MRA HEAD WITHOUT CONTRAST TECHNIQUE: Multiplanar, multiecho pulse sequences of the brain and surrounding structures were obtained without intravenous contrast. Angiographic images of the head were obtained using MRA technique without contrast. COMPARISON:  Head CT 10/07/2016 FINDINGS: MRI HEAD FINDINGS Brain: The midline structures are normal. No focal diffusion restriction to indicate acute infarct. No intraparenchymal hemorrhage. Old left basal ganglia lacunar infarct. Mild periventricular white matter hyperintensity. No mass lesion. No chronic microhemorrhage or cerebral amyloid angiopathy. No hydrocephalus, age advanced atrophy or lobar predominant volume loss. No dural abnormality or extra-axial collection. Skull and upper cervical spine: The visualized skull base, calvarium, upper cervical spine and extracranial soft tissues are normal. Sinuses/Orbits: No fluid levels or advanced mucosal thickening. No mastoid effusion. Normal orbits. MRA HEAD FINDINGS Intracranial internal carotid arteries: Normal. Anterior cerebral arteries: Normal. Middle cerebral arteries: Normal. Posterior communicating arteries: Absent bilaterally. Posterior cerebral arteries: Normal. Basilar artery: Normal. Vertebral arteries: Codominant. Normal. Superior cerebellar arteries: Normal. Anterior inferior cerebellar arteries: Normal. Posterior inferior cerebellar arteries: Left dominant. IMPRESSION: 1. No acute intracranial  abnormality. 2. Old left basal ganglia lacunar infarct. 3. No intracranial arterial occlusion or flow limiting stenosis. Electronically Signed   By: Ulyses Jarred M.D.   On: 10/07/2016 22:57   Mr Cervical Spine Wo Contrast  Result Date: 10/08/2016 CLINICAL DATA:  Initial evaluation for left-sided numbness and weakness, gait difficulty. History of prior cervical spine fusion. EXAM: MRI CERVICAL SPINE WITHOUT CONTRAST TECHNIQUE: Multiplanar, multisequence MR imaging of the cervical spine was performed. No intravenous contrast was administered. COMPARISON:  None available. FINDINGS: Alignment: Mild straightening of the normal cervical lordosis. Trace 2 mm retrolisthesis of C3 on C4. Vertebrae: Patient is status post ACDF at C4 through C6. There appears to be solid osseous fusion at these levels. Vertebral body heights maintained. No evidence for acute or chronic fracture. No discrete or worrisome osseous lesions. Bone marrow signal intensity normal. No abnormal marrow edema. Cord: Signal intensity within the cervical spinal cord is within normal limits. Posterior Fossa, vertebral arteries, paraspinal tissues: Visualized brain and posterior fossa within normal limits. Craniocervical junction normal. Paraspinous and prevertebral soft tissues normal. Normal intravascular flow voids present within the vertebral arteries bilaterally. Disc levels: C2-C3: Mild diffuse disc bulging, indenting the ventral thecal sac without significant canal stenosis. Mild bilateral facet degeneration. No significant neural foraminal stenosis. C3-C4: 2 mm retrolisthesis. Chronic diffuse degenerative disc osteophyte with intervertebral disc space narrowing. Broad posterior component flattens and effaces the ventral thecal sac. Secondary cord flattening without cord signal changes. Severe canal stenosis with the thecal sac measuring 6 mm in AP diameter. Severe bilateral C4 foraminal stenosis present as well. C4-C5: Status post fusion. No  residual canal stenosis. Residual uncovertebral hypertrophy with moderate left and mild right foraminal stenosis. C5-C6: Status post fusion. No residual canal stenosis. Residual left-sided uncovertebral disease with resultant mild left foraminal stenosis. No significant right foraminal encroachment C6-C7: Chronic diffuse degenerative disc osteophyte with intervertebral disc space narrowing. Broad posterior component flattens and partially effaces the ventral thecal sac. Mild ligamentum flavum hypertrophy. Moderate spinal stenosis. No significant cord deformity. Severe bilateral C7 foraminal stenosis, left slightly worse than  right. C7-T1: Mild facet hypertrophy. No significant disc bulge. No stenosis. Visualized upper thoracic spine within normal limits. IMPRESSION: 1. Status post cervical fusion at C4 through C6 without residual canal stenosis. 2. Adjacent segment disease with degenerative disc osteophyte at C3-4 with resultant severe canal and bilateral C4 foraminal stenosis. 3. Diffuse degenerative disc osteophyte at C6-7 with resultant moderate canal and severe bilateral C7 foraminal stenosis. Electronically Signed   By: Jeannine Boga M.D.   On: 10/08/2016 14:15   US Carotid Bilateral (at [facility] And [facility] Only)  Result Date: 10/08/2016 CLINICAL DATA:  66 year old male with a history of stroke. Cardiovascular risk factors include hypertension, known prior stroke/TIA, coronary artery disease, hyperlipidemia, tobacco use EXAM: BILATERAL CAROTID DUPLEX ULTRASOUND TECHNIQUE: Pearline Cables scale imaging, color Doppler and duplex ultrasound were performed of bilateral carotid and vertebral arteries in the neck. COMPARISON:  05/22/2013 FINDINGS: Criteria: Quantification of carotid stenosis is based on velocity parameters that correlate the residual internal carotid diameter with NASCET-based stenosis levels, using the diameter of the distal internal carotid lumen as the denominator for stenosis measurement. The following  velocity measurements were obtained: RIGHT ICA:  Systolic 381 cm/sec, Diastolic 44 cm/sec CCA:  017 cm/sec SYSTOLIC ICA/CCA RATIO:  1.1 ECA:  98 cm/sec LEFT ICA:  Systolic 98 cm/sec, Diastolic 45 cm/sec CCA:  510 cm/sec SYSTOLIC ICA/CCA RATIO:  0.7 ECA:  122 cm/sec Right Brachial SBP: Not acquired Left Brachial SBP: Not acquired RIGHT CAROTID ARTERY: No significant calcifications of the right common carotid artery. Intermediate waveform maintained. Heterogeneous and partially calcified plaque at the right carotid bifurcation. No significant lumen shadowing. Low resistance waveform of the right ICA. No significant tortuosity. RIGHT VERTEBRAL ARTERY: Antegrade flow with low resistance waveform. LEFT CAROTID ARTERY: No significant calcifications of the left common carotid artery. Intermediate waveform maintained. Heterogeneous and partially calcified plaque at the left carotid bifurcation without significant lumen shadowing. Low resistance waveform of the left ICA. No significant tortuosity. LEFT VERTEBRAL ARTERY:  Antegrade flow with low resistance waveform. IMPRESSION: Color duplex indicates minimal heterogeneous and calcified plaque, with no hemodynamically significant stenosis by duplex criteria in the extracranial cerebrovascular circulation. Signed, Dulcy Fanny. Earleen Newport, DO [facility] Electronically Signed   By: Corrie Mckusick D.O.   On: 10/08/2016 09:51   Mr Jodene Nam Head Wo Contrast  Result Date: 10/07/2016 CLINICAL DATA:  Left-sided weakness EXAM: MRI HEAD WITHOUT CONTRAST MRA HEAD WITHOUT CONTRAST TECHNIQUE: Multiplanar, multiecho pulse sequences of the brain and surrounding structures were obtained without intravenous contrast. Angiographic images of the head were obtained using MRA technique without contrast. COMPARISON:  Head CT 10/07/2016 FINDINGS: MRI HEAD FINDINGS Brain: The midline structures are normal. No focal diffusion restriction to indicate  acute infarct. No intraparenchymal hemorrhage. Old left basal ganglia lacunar infarct. Mild periventricular white matter hyperintensity. No mass lesion. No chronic microhemorrhage or cerebral amyloid angiopathy. No hydrocephalus, age advanced atrophy or lobar predominant volume loss. No dural abnormality or extra-axial collection. Skull and upper cervical spine: The visualized skull base, calvarium, upper cervical spine and extracranial soft tissues are normal. Sinuses/Orbits: No fluid levels or advanced mucosal thickening. No mastoid effusion. Normal orbits. MRA HEAD FINDINGS Intracranial internal carotid arteries: Normal. Anterior cerebral arteries: Normal. Middle cerebral arteries: Normal. Posterior communicating arteries: Absent bilaterally. Posterior cerebral arteries: Normal. Basilar artery: Normal. Vertebral arteries: Codominant. Normal. Superior cerebellar arteries: Normal. Anterior inferior cerebellar arteries: Normal. Posterior inferior cerebellar arteries: Left dominant. IMPRESSION: 1. No acute intracranial abnormality. 2. Old left basal ganglia lacunar infarct.  3. No intracranial arterial occlusion or flow limiting stenosis. Electronically Signed   By: Ulyses Jarred M.D.   On: 10/07/2016 22:57    EKG:   Orders placed or performed during the hospital encounter of 10/07/16  . ED EKG  . ED EKG    ASSESSMENT AND PLAN:    1. TIA : Likely ischemic;  CT head negative. MRI/MRA brain Negative Carotid Dopplers with no significant stenosis Echocardiogram. Results pending  PTOT no recommendations  Patient has passed bedside swallow evaluation  LDL 62  Continue aspirin and Plavix  MRI of the C-spine with CVR C3-C4 central canal stenosis , neurosurgery consulted   2. Hypertension: Continue antihypertensive medication ramipril  3. CAD: Stable; continue aspirin and Plavix. Imdur per home regimen.  4. OSA: The patient does not consistently wear CPAP at home which contributes to his risk for  stroke. He agrees to use CPAP hospitalized.  5. COPD: Continue inhaled corticosteroid  6. DVT prophylaxis: Lovenox 7. GI prophylaxis: None     All the records are reviewed and case discussed with Care Management/Social Workerr. Management plans discussed with the patient, family and they are in agreement.  CODE STATUS: fc   TOTAL TIME TAKING CARE OF THIS PATIENT: 36  minutes.   POSSIBLE D/C IN am  DAYS, DEPENDING ON CLINICAL CONDITION.  Note: This dictation was prepared with Dragon dictation along with smaller phrase technology. Any transcriptional errors that result from this process are unintentional.   Nicholes Mango M.D on 10/08/2016 at 2:46 PM  Between 7am to 6pm - Pager - [phone] After 6pm go to [email or web address] - password [email or web address] [facility]  [facility]  Office  [phone]  CC: Primary care physician; Kirk Ruths, MD

## 2016-10-08 NOTE — Evaluation (Signed)
Occupational Therapy Evaluation Patient Details Name: Louis Gutierrez MRN: [id number] DOB: [birth year] Today's Date: 10/08/2016    History of Present Illness Pt admitted for possible CVA this date, however all imaging returning negative. Pt with complaints of L side weakness. History of CVA and CAD s/p MI along with HTN. MR cervical spine ordered.   Clinical Impression   Pt seen for OT evaluation this date. Prior to hospital admission, pt was independent with ADL/IADL including driving and working full time. Pt lives with his spouse and children. Currently pt is experiencing mild strength deficits in L hand and LLE but generally able to perform self care tasks and functional mobility without risk of safety or need for assistance. No sensory, coordination, balance, or visual deficits noted with testing. Pt educated in therapeutic exercises to perform with LUE and pt encouraged to consider outpatient OT services should the weakness get worse and/or if it interferes with his work duties or fine motor tasks during his day. All education/training provided, no additional skilled OT needs at this time. Will sign off. Please re-consult if additional skilled OT needs arise. Pt safe to return home based on OT performance during assessment.    Follow Up Recommendations  No OT follow up (Consider OP OT services if L hand weakness gets worse and/or if it impacts ability to perform work/fine motor tasks.)    Equipment Recommendations  None recommended by OT    Recommendations for Other Services       Precautions / Restrictions Precautions Precautions: Fall Restrictions Weight Bearing Restrictions: No      Mobility Bed Mobility Overal bed mobility: Independent             General bed mobility comments: safe technique performed with upright posture noted. No dizziness noted with transition to EOB  Transfers Overall transfer level: Modified independent Equipment used: None              General transfer comment: safe technique with ability to push from seated position prior to standing. Slight unsteadiness noted while standing at EOB, however no overt LOB noted    Balance Overall balance assessment: Needs assistance Sitting-balance support: Feet supported;No upper extremity supported Sitting balance-Leahy Scale: Normal     Standing balance support: During functional activity Standing balance-Leahy Scale: Good Standing balance comment: slight unsteadiness with decreased step length noted                           ADL either performed or assessed with clinical judgement   ADL Overall ADL's : Modified independent                                       General ADL Comments: generally able to perform ADL at baseline with minimal impact of minimal L hand weakness     Vision Baseline Vision/History: Wears glasses Wears Glasses: At all times Patient Visual Report: No change from baseline Vision Assessment?: No apparent visual deficits     Perception     Praxis      Pertinent Vitals/Pain Pain Assessment: No/denies pain     Hand Dominance Right   Extremity/Trunk Assessment Upper Extremity Assessment Upper Extremity Assessment: LUE deficits/detail LUE Deficits / Details: 4+/5 grip and pinch strenght in L hand, sensation and coordination intact   Lower Extremity Assessment Lower Extremity Assessment: LLE deficits/detail LLE Deficits / Details: 4+/5 grossly,  coordination/sensation intact   Cervical / Trunk Assessment Cervical / Trunk Assessment: Normal   Communication Communication Communication: No difficulties   Cognition Arousal/Alertness: Awake/alert Behavior During Therapy: WFL for tasks assessed/performed Overall Cognitive Status: Within Functional Limits for tasks assessed                                     General Comments       Exercises Exercises: Other exercises Other Exercises Other Exercises:  provided pt with basic grip/pinch strength exercises with verbal instruction and visual demonstration, pt verbalized understanding   Shoulder Instructions      Home Living Family/patient expects to be discharged to:: Private residence Living Arrangements: Spouse/significant other;Children Available Help at Discharge: Family Type of Home: House Home Access: Level entry     Home Layout: One level (1 step from mudroom to kitchen, no rails)     Bathroom Shower/Tub: Teacher, early years/pre: Standard     Home Equipment: None          Prior Functioning/Environment Level of Independence: Independent        Comments: was indep with all ADLs, working as a Dealer and transporter of vehicles        OT Problem List:        OT Treatment/Interventions:      OT Goals(Current goals can be found in the care plan section) Acute Rehab OT Goals Patient Stated Goal: go home OT Goal Formulation: All assessment and education complete, DC therapy  OT Frequency:     Barriers to D/C:            Co-evaluation              AM-PAC PT "6 Clicks" Daily Activity     Outcome Measure Help from another person eating meals?: None Help from another person taking care of personal grooming?: None Help from another person toileting, which includes using toliet, bedpan, or urinal?: None Help from another person bathing (including washing, rinsing, drying)?: None Help from another person to put on and taking off regular upper body clothing?: None Help from another person to put on and taking off regular lower body clothing?: None 6 Click Score: 24   End of Session    Activity Tolerance: Patient tolerated treatment well Patient left: in chair;with call bell/phone within reach;with chair alarm set  OT Visit Diagnosis: Muscle weakness (generalized) (M62.81)                Time: 1610-9604 OT Time Calculation (min): 8 min Charges:  OT General Charges $OT Visit: 1 Visit OT  Evaluation $OT Eval Low Complexity: 1 Low G-Codes: OT G-codes **NOT FOR INPATIENT CLASS** Functional Assessment Tool Used: AM-PAC 6 Clicks Daily Activity;Clinical judgement Functional Limitation: Self care Self Care Current Status (V4098): 0 percent impaired, limited or restricted Self Care Goal Status (J1914): 0 percent impaired, limited or restricted Self Care Discharge Status (N8295): 0 percent impaired, limited or restricted   Jeni Salles, MPH, MS, OTR/L ascom [phone] 10/08/16, 1:22 PM

## 2016-10-08 NOTE — Consult Note (Signed)
Reason for Consult:Left sided weakness Referring Physician: Gouru  CC: Left sided weakness  HPI: Louis Gutierrez is an 66 y.o. male with a history of a cervical fusion and stroke who presented with complaints of left sided numbness/weakness and difficulty with gait.  Reports that he went to bed at baseline at midnight on 9/17.  Awakened 9/18 with left sided numbness and weakness.  Was off balance with gait as well.  With no improvement patient presented for evaluation.  Reports gait much better today but continues to have some residual numbness and weakness on the left.  Symptoms requiring cervical fusion were left sided as well. Patient reports no bowel or bladder incontinence.     No neck pain.  Past Medical History:  Diagnosis Date  . Asthma without status asthmaticus   . Atherosclerotic cerebrovascular disease 06/09/2014   Overview:  Small and facial weakness with minimal weakness   . CAD (coronary artery disease)   . Cerebral vascular accident ([place])   . Chronic obstructive pulmonary disease ([place]) 06/09/2014   Last Assessment & Plan:  The patient shortness of breath is generally unchanged and treatment is tolerated.     Marland Kitchen COPD (chronic obstructive pulmonary disease) ([place])   . CVA (cerebral vascular accident) ([place]) 2015  . Depression   . Essential (primary) hypertension 06/09/2014   Last Assessment & Plan:  Patients blood pressure has seemingly been controlled without significant side effects such as dizziness or slow heart rate.     . H/O insomnia   . Hypertension 06/10/2010   ECHO at [facility] - see Media tab  . Mixed hyperlipidemia   . Nephrolithiasis   . Obesity   . OSA (obstructive sleep apnea)   . Osteoarthritis   . STEMI (ST elevation myocardial infarction) ([place]) 06/07/2010  . Stroke of unknown cause ([place]) 08/06/2013  . Tobacco abuse   . Urinary incontinence   . Varicose veins     Past Surgical History:  Procedure Laterality Date  . BACK SURGERY  2003   C3C4  . CARDIAC  CATHETERIZATION  06/07/2010   50 % ostial narrowing in 1st diagonal from LAD, 2nd diagonal had 40% ostial narrowing; AV groove circumflex was diminutive and occluded proximally; RCA totally occluded in mid segment - stented with 3.5 x 62mm Promus Element DES  - See Media tab  . CARDIAC CATHETERIZATION  05/2013   [place]  . COLONOSCOPY WITH PROPOFOL N/A 02/26/2015   Procedure: COLONOSCOPY WITH PROPOFOL;  Surgeon: Josefine Class, MD;  Location: [facility] ENDOSCOPY;  Service: Endoscopy;  Laterality: N/A;  . ESOPHAGOGASTRODUODENOSCOPY  02/26/2015   Procedure: ESOPHAGOGASTRODUODENOSCOPY (EGD);  Surgeon: Josefine Class, MD;  Location: [facility] ENDOSCOPY;  Service: Endoscopy;;  . LOOP RECORDER IMPLANT  08/16/13   Dr. Sallyanne Kuster    Family History  Problem Relation Age of Onset  . Cancer Mother        oral  . Stroke Maternal Grandmother   . Diabetes Brother   . Coronary artery disease Brother     Social History:  reports that he has been smoking Cigarettes.  He has a 53.00 pack-year smoking history. He has never used smokeless tobacco. He reports that he does not drink alcohol or use drugs.  Allergies  Allergen Reactions  . Penicillins Hives    Has patient had a PCN reaction causing immediate rash, facial/tongue/throat swelling, SOB or lightheadedness with hypotension: No Has patient had a PCN reaction causing severe rash involving mucus membranes or skin necrosis: No Has patient had a  PCN reaction that required hospitalization No Has patient had a PCN reaction occurring within the last 10 years: No If all of the above answers are "NO", then may proceed with Cephalosporin use.     Medications:  I have reviewed the patient's current medications. Prior to Admission:  Prescriptions Prior to Admission  Medication Sig Dispense Refill Last Dose  . acetaminophen (TYLENOL) 500 MG tablet Take 1,000 mg by mouth every 8 (eight) hours as needed for mild pain.    PRN at PRN  . ADVAIR DISKUS 100-50  MCG/DOSE AEPB Inhale 1 puff into the lungs 2 (two) times daily.   PRN at PRN  . aspirin EC 81 MG tablet Take 81 mg by mouth daily.   10/07/2016 at 0800  . clopidogrel (PLAVIX) 75 MG tablet TAKE ONE (1) TABLET EACH DAY 30 tablet 11 10/07/2016 at 0800  . COMBIVENT RESPIMAT 20-100 MCG/ACT AERS respimat Inhale 1 puff into the lungs 2 (two) times daily.   10/07/2016 at 0800  . isosorbide mononitrate (IMDUR) 30 MG 24 hr tablet TAKE ONE (1) TABLET EACH DAY 90 tablet 2 10/07/2016 at 0800  . nitroGLYCERIN (NITROSTAT) 0.4 MG SL tablet Place 0.4 mg under the tongue every 5 (five) minutes as needed for chest pain.    PRN at PRN  . PARoxetine (PAXIL) 40 MG tablet Take 40 mg by mouth.   10/07/2016 at 0800  . pravastatin (PRAVACHOL) 40 MG tablet Take 1 tablet (40 mg total) by mouth daily. 30 tablet 9 10/07/2016 at 0800  . ramipril (ALTACE) 10 MG capsule TAKE ONE (1) CAPSULE EACH DAY 30 capsule 8 10/07/2016 at 0800  . traZODone (DESYREL) 100 MG tablet Take 50-100 mg by mouth at bedtime as needed for sleep.    PRN at PRN   Scheduled: . aspirin EC  81 mg Oral Daily  . clopidogrel  75 mg Oral Daily  . enoxaparin (LOVENOX) injection  40 mg Subcutaneous Q24H  . ipratropium-albuterol  3 mL Inhalation BID  . isosorbide mononitrate  30 mg Oral Daily  . mometasone-formoterol  2 puff Inhalation BID  . PARoxetine  40 mg Oral Daily  . pravastatin  40 mg Oral Daily  . ramipril  10 mg Oral Daily    ROS: History obtained from the patient  General ROS: negative for - chills, fatigue, fever, night sweats, weight gain or weight loss Psychological ROS: negative for - behavioral disorder, hallucinations, memory difficulties, mood swings or suicidal ideation Ophthalmic ROS: negative for - blurry vision, double vision, eye pain or loss of vision ENT ROS: negative for - epistaxis, nasal discharge, oral lesions, sore throat, tinnitus or vertigo Allergy and Immunology ROS: negative for - hives or itchy/watery eyes Hematological and  Lymphatic ROS: negative for - bleeding problems, bruising or swollen lymph nodes Endocrine ROS: negative for - galactorrhea, hair pattern changes, polydipsia/polyuria or temperature intolerance Respiratory ROS: cough Cardiovascular ROS: negative for - chest pain, dyspnea on exertion, edema or irregular heartbeat Gastrointestinal ROS: negative for - abdominal pain, diarrhea, hematemesis, nausea/vomiting or stool incontinence Genito-Urinary ROS: negative for - dysuria, hematuria, incontinence or urinary frequency/urgency Musculoskeletal ROS: negative for - joint swelling or muscular weakness Neurological ROS: as noted in HPI Dermatological ROS: negative for rash and skin lesion changes  Physical Examination: Blood pressure 127/76, pulse 69, temperature 97.7 F (36.5 C), temperature source Oral, resp. rate 16, height 5\' 11"  (1.803 m), weight 95.4 kg (210 lb 4 oz), SpO2 93 %.  HEENT-  Normocephalic, no lesions, without obvious abnormality.  Normal external eye and conjunctiva.  Normal TM's bilaterally.  Normal auditory canals and external ears. Normal external nose, mucus membranes and septum.  Normal pharynx. Cardiovascular- S1, S2 normal, pulses palpable throughout   Lungs- chest clear, no wheezing, rales, normal symmetric air entry Abdomen- soft, non-tender; bowel sounds normal; no masses,  no organomegaly Extremities- no edema Lymph-no adenopathy palpable Musculoskeletal-no joint tenderness, deformity or swelling Skin-warm and dry, no hyperpigmentation, vitiligo, or suspicious lesions  Neurological Examination   Mental Status: Alert, oriented, thought content appropriate.  Speech fluent without evidence of aphasia.  Able to follow 3 step commands without difficulty. Cranial Nerves: II: Discs flat bilaterally; Visual fields grossly normal, pupils equal, round, reactive to light and accommodation III,IV, VI: ptosis not present, extra-ocular motions intact bilaterally V,VII: smile  symmetric, facial light touch sensation normal bilaterally VIII: hearing normal bilaterally IX,X: gag reflex present XI: bilateral shoulder shrug XII: midline tongue extension Motor: Right : Upper extremity   5/5    Left:     Upper extremity   5/5 with 5-/5 hand grip  Lower extremity   5/5     Lower extremity   5/5 Tone and bulk:normal tone throughout; no atrophy noted Sensory: Pinprick and light touch decreased in the LLE Deep Tendon Reflexes: 3+ and symmetric throughout Plantars: Right: upgoing   Left: upgoing Cerebellar: Normal finger-to-nose and normal heel-to-shin testing bilaterally Gait: not tested due to safety concerns    Laboratory Studies:   Basic Metabolic Panel:  Recent Labs Lab 10/07/16 1731  NA 136  K 4.2  CL 102  CO2 26  GLUCOSE 110*  BUN 21*  CREATININE 0.98  CALCIUM 9.4    Liver Function Tests:  Recent Labs Lab 10/07/16 1731  AST 19  ALT 16*  ALKPHOS 113  BILITOT 0.6  PROT 7.3  ALBUMIN 4.0   No results for input(s): LIPASE, AMYLASE in the last 168 hours. No results for input(s): AMMONIA in the last 168 hours.  CBC:  Recent Labs Lab 10/07/16 1731  WBC 8.8  NEUTROABS 6.5  HGB 11.7*  HCT 35.7*  MCV 76.7*  PLT 300    Cardiac Enzymes:  Recent Labs Lab 10/07/16 1731  TROPONINI <0.03    BNP: Invalid input(s): POCBNP  CBG:  Recent Labs Lab 10/07/16 1751  GLUCAP 103*    Microbiology: Results for orders placed or performed in visit on 10/16/15  Microscopic Examination     Status: Abnormal   Collection Time: 10/16/15 11:04 AM  Result Value Ref Range Status   WBC, UA 0-5 0 - 5 /hpf Final   RBC, UA 11-30 (A) 0 - 2 /hpf Final   Epithelial Cells (non renal) None seen 0 - 10 /hpf Final   Crystals Present (A) N/A Final   Crystal Type Amorphous Sediment N/A Final   Bacteria, UA Few None seen/Few Final    Coagulation Studies:  Recent Labs  10/07/16 1820  LABPROT 13.7  INR 1.06    Urinalysis: No results for input(s):  COLORURINE, LABSPEC, PHURINE, GLUCOSEU, HGBUR, BILIRUBINUR, KETONESUR, PROTEINUR, UROBILINOGEN, NITRITE, LEUKOCYTESUR in the last 168 hours.  Invalid input(s): APPERANCEUR  Lipid Panel:     Component Value Date/Time   CHOL 116 10/08/2016 0507   CHOL 110 05/22/2013 0526   TRIG 87 10/08/2016 0507   TRIG 162 05/22/2013 0526   HDL 37 (L) 10/08/2016 0507   HDL 17 (L) 05/22/2013 0526   CHOLHDL 3.1 10/08/2016 0507   VLDL 17 10/08/2016 0507   VLDL 32 05/22/2013 0526  LDLCALC 62 10/08/2016 0507   LDLCALC 61 05/22/2013 0526    HgbA1C:  Lab Results  Component Value Date   HGBA1C 6.2 (H) 10/07/2016    Urine Drug Screen:  No results found for: LABOPIA, COCAINSCRNUR, LABBENZ, AMPHETMU, THCU, LABBARB  Alcohol Level: No results for input(s): ETH in the last 168 hours.  Other results: EKG: normal sinus rhythm at 83 bpm with incomplete RBBB.  Imaging: Ct Head Wo Contrast  Result Date: 10/07/2016 CLINICAL DATA:  Severe headache EXAM: CT HEAD WITHOUT CONTRAST TECHNIQUE: Contiguous axial images were obtained from the base of the skull through the vertex without intravenous contrast. COMPARISON:  12/05/2013 FINDINGS: Brain: Remote left basal ganglia infarct as before. No acute intracranial hemorrhage, mass lesion, new infarction, midline shift, herniation, hydrocephalus, or extra-axial fluid collection. No focal mass effect or edema. Cisterns are patent. No cerebellar abnormality. Vascular: No hyperdense vessel or unexpected calcification. Skull: Normal. Negative for fracture or focal lesion. Sinuses/Orbits: No acute finding. Other: None. IMPRESSION: No acute intracranial abnormality by noncontrast CT. Remote left basal ganglia lacunar-type infarct as before Electronically Signed   By: Jerilynn Mages.  Shick M.D.   On: 10/07/2016 17:43   Mr Brain Wo Contrast  Result Date: 10/07/2016 CLINICAL DATA:  Left-sided weakness EXAM: MRI HEAD WITHOUT CONTRAST MRA HEAD WITHOUT CONTRAST TECHNIQUE: Multiplanar, multiecho  pulse sequences of the brain and surrounding structures were obtained without intravenous contrast. Angiographic images of the head were obtained using MRA technique without contrast. COMPARISON:  Head CT 10/07/2016 FINDINGS: MRI HEAD FINDINGS Brain: The midline structures are normal. No focal diffusion restriction to indicate acute infarct. No intraparenchymal hemorrhage. Old left basal ganglia lacunar infarct. Mild periventricular white matter hyperintensity. No mass lesion. No chronic microhemorrhage or cerebral amyloid angiopathy. No hydrocephalus, age advanced atrophy or lobar predominant volume loss. No dural abnormality or extra-axial collection. Skull and upper cervical spine: The visualized skull base, calvarium, upper cervical spine and extracranial soft tissues are normal. Sinuses/Orbits: No fluid levels or advanced mucosal thickening. No mastoid effusion. Normal orbits. MRA HEAD FINDINGS Intracranial internal carotid arteries: Normal. Anterior cerebral arteries: Normal. Middle cerebral arteries: Normal. Posterior communicating arteries: Absent bilaterally. Posterior cerebral arteries: Normal. Basilar artery: Normal. Vertebral arteries: Codominant. Normal. Superior cerebellar arteries: Normal. Anterior inferior cerebellar arteries: Normal. Posterior inferior cerebellar arteries: Left dominant. IMPRESSION: 1. No acute intracranial abnormality. 2. Old left basal ganglia lacunar infarct. 3. No intracranial arterial occlusion or flow limiting stenosis. Electronically Signed   By: Ulyses Jarred M.D.   On: 10/07/2016 22:57   US Carotid Bilateral (at [facility] And [facility] Only)  Result Date: 10/08/2016 CLINICAL DATA:  66 year old male with a history of stroke. Cardiovascular risk factors include hypertension, known prior stroke/TIA, coronary artery disease, hyperlipidemia, tobacco use EXAM: BILATERAL CAROTID DUPLEX ULTRASOUND TECHNIQUE: Pearline Cables scale imaging, color Doppler and duplex ultrasound were performed of  bilateral carotid and vertebral arteries in the neck. COMPARISON:  05/22/2013 FINDINGS: Criteria: Quantification of carotid stenosis is based on velocity parameters that correlate the residual internal carotid diameter with NASCET-based stenosis levels, using the diameter of the distal internal carotid lumen as the denominator for stenosis measurement. The following velocity measurements were obtained: RIGHT ICA:  Systolic 497 cm/sec, Diastolic 44 cm/sec CCA:  026 cm/sec SYSTOLIC ICA/CCA RATIO:  1.1 ECA:  98 cm/sec LEFT ICA:  Systolic 98 cm/sec, Diastolic 45 cm/sec CCA:  378 cm/sec SYSTOLIC ICA/CCA RATIO:  0.7 ECA:  122 cm/sec Right Brachial SBP: Not acquired Left Brachial SBP: Not acquired RIGHT CAROTID ARTERY: No significant calcifications  of the right common carotid artery. Intermediate waveform maintained. Heterogeneous and partially calcified plaque at the right carotid bifurcation. No significant lumen shadowing. Low resistance waveform of the right ICA. No significant tortuosity. RIGHT VERTEBRAL ARTERY: Antegrade flow with low resistance waveform. LEFT CAROTID ARTERY: No significant calcifications of the left common carotid artery. Intermediate waveform maintained. Heterogeneous and partially calcified plaque at the left carotid bifurcation without significant lumen shadowing. Low resistance waveform of the left ICA. No significant tortuosity. LEFT VERTEBRAL ARTERY:  Antegrade flow with low resistance waveform. IMPRESSION: Color duplex indicates minimal heterogeneous and calcified plaque, with no hemodynamically significant stenosis by duplex criteria in the extracranial cerebrovascular circulation. Signed, Dulcy Fanny. Earleen Newport, DO [facility] Electronically Signed   By: Corrie Mckusick D.O.   On: 10/08/2016 09:51   Mr Jodene Nam Head Wo Contrast  Result Date: 10/07/2016 CLINICAL DATA:  Left-sided weakness EXAM: MRI HEAD WITHOUT CONTRAST MRA HEAD WITHOUT  CONTRAST TECHNIQUE: Multiplanar, multiecho pulse sequences of the brain and surrounding structures were obtained without intravenous contrast. Angiographic images of the head were obtained using MRA technique without contrast. COMPARISON:  Head CT 10/07/2016 FINDINGS: MRI HEAD FINDINGS Brain: The midline structures are normal. No focal diffusion restriction to indicate acute infarct. No intraparenchymal hemorrhage. Old left basal ganglia lacunar infarct. Mild periventricular white matter hyperintensity. No mass lesion. No chronic microhemorrhage or cerebral amyloid angiopathy. No hydrocephalus, age advanced atrophy or lobar predominant volume loss. No dural abnormality or extra-axial collection. Skull and upper cervical spine: The visualized skull base, calvarium, upper cervical spine and extracranial soft tissues are normal. Sinuses/Orbits: No fluid levels or advanced mucosal thickening. No mastoid effusion. Normal orbits. MRA HEAD FINDINGS Intracranial internal carotid arteries: Normal. Anterior cerebral arteries: Normal. Middle cerebral arteries: Normal. Posterior communicating arteries: Absent bilaterally. Posterior cerebral arteries: Normal. Basilar artery: Normal. Vertebral arteries: Codominant. Normal. Superior cerebellar arteries: Normal. Anterior inferior cerebellar arteries: Normal. Posterior inferior cerebellar arteries: Left dominant. IMPRESSION: 1. No acute intracranial abnormality. 2. Old left basal ganglia lacunar infarct. 3. No intracranial arterial occlusion or flow limiting stenosis. Electronically Signed   By: Ulyses Jarred M.D.   On: 10/07/2016 22:57     Assessment/Plan: 66 year old male presenting with acute onset left sided weakness and numbness.  Patient with a history of cervical fusion.  He reports that his symptoms at the time of that presentation were left sided as well.  MRI of the brain performed to rule out an acute infarct and has been reviewed.  It shows no evidence of acute  changes.  Carotid dopplers show no evidence of hemodynamically significant stenosis.  A1c 6.2, LDL 62. Patient on ASA and Plavix at home.  With continued symptoms doubt TIA.  Would rule out cervical pathology.  Recommendations: 1.  MRI of the cervical spine.    Alexis Goodell, MD Neurology [phone] 10/08/2016, 12:10 PM

## 2016-10-08 NOTE — Progress Notes (Signed)
PT Cancellation Note  Patient Details Name: Louis Gutierrez MRN: [id number] DOB: [birth year]   Cancelled Treatment:    Reason Eval/Treat Not Completed: Other (comment)Per RN, pt is out of room for imaging. Will re-attempt at next available time.   Manfred Arch, SPT Manfred Arch 10/08/2016, 9:37 AM

## 2016-10-08 NOTE — Care Management Obs Status (Signed)
[place] NOTIFICATION   Patient Details  Name: MARQUICE UDDIN MRN: [id number] Date of Birth: [birth year]   Medicare Observation Status Notification Given:  Yes  Notice signed, one given to patient and the other to HIM for scanning  Katrina Stack, RN 10/08/2016, 10:30 AM

## 2016-10-09 ENCOUNTER — Other Ambulatory Visit: Payer: Medicare HMO

## 2016-10-09 ENCOUNTER — Observation Stay: Payer: Medicare HMO

## 2016-10-09 DIAGNOSIS — I2510 Atherosclerotic heart disease of native coronary artery without angina pectoris: Secondary | ICD-10-CM | POA: Diagnosis not present

## 2016-10-09 DIAGNOSIS — I10 Essential (primary) hypertension: Secondary | ICD-10-CM | POA: Diagnosis not present

## 2016-10-09 DIAGNOSIS — R42 Dizziness and giddiness: Secondary | ICD-10-CM | POA: Diagnosis not present

## 2016-10-09 DIAGNOSIS — M50021 Cervical disc disorder at C4-C5 level with myelopathy: Secondary | ICD-10-CM | POA: Diagnosis not present

## 2016-10-09 DIAGNOSIS — M50023 Cervical disc disorder at C6-C7 level with myelopathy: Secondary | ICD-10-CM | POA: Diagnosis not present

## 2016-10-09 DIAGNOSIS — G4733 Obstructive sleep apnea (adult) (pediatric): Secondary | ICD-10-CM | POA: Diagnosis not present

## 2016-10-09 DIAGNOSIS — M4802 Spinal stenosis, cervical region: Secondary | ICD-10-CM | POA: Diagnosis not present

## 2016-10-09 NOTE — Progress Notes (Signed)
Patient is requesting a cane at discharge due to intermittent unsteadiness. Madlyn Frankel, RN

## 2016-10-09 NOTE — Progress Notes (Signed)
[facility] [place] NAME: Louis Gutierrez    MR#:  [id number]  DATE OF BIRTH:  07-12-1950  SUBJECTIVE:  CHIEF COMPLAINT:  Patient is resting comfortably. Denies any headache or blurry vision. Denies any speech difficulty. Denies any extremity weaknesS during my examination. Past bedside swallow evaluation. Feels dizzi still on trying to walk.  REVIEW OF SYSTEMS:  CONSTITUTIONAL: No fever, fatigue or weakness.  EYES: No blurred or double vision.  EARS, NOSE, AND THROAT: No tinnitus or ear pain.  RESPIRATORY: No cough, shortness of breath, wheezing or hemoptysis.  CARDIOVASCULAR: No chest pain, orthopnea, edema.  GASTROINTESTINAL: No nausea, vomiting, diarrhea or abdominal pain.  GENITOURINARY: No dysuria, hematuria.  ENDOCRINE: No polyuria, nocturia,  HEMATOLOGY: No anemia, easy bruising or bleeding SKIN: No rash or lesion. MUSCULOSKELETAL: No joint pain or arthritis.   NEUROLOGIC: No tingling, numbness, weakness.  PSYCHIATRY: No anxiety or depression.   DRUG ALLERGIES:   Allergies  Allergen Reactions  . Penicillins Hives    Has patient had a PCN reaction causing immediate rash, facial/tongue/throat swelling, SOB or lightheadedness with hypotension: No Has patient had a PCN reaction causing severe rash involving mucus membranes or skin necrosis: No Has patient had a PCN reaction that required hospitalization No Has patient had a PCN reaction occurring within the last 10 years: No If all of the above answers are "NO", then may proceed with Cephalosporin use.     VITALS:  Blood pressure 139/79, pulse 97, temperature 98.7 F (37.1 C), temperature source Oral, resp. rate 20, height 5\' 11"  (1.803 m), weight 95.4 kg (210 lb 4 oz), SpO2 95 %.  PHYSICAL EXAMINATION:  GENERAL:  66 y.o.-year-old patient lying in the bed with no acute distress.  EYES: Pupils equal, round, reactive to light and accommodation. No scleral icterus.  Extraocular muscles intact.  HEENT: Head atraumatic, normocephalic. Oropharynx and nasopharynx clear.  NECK:  Supple, no jugular venous distention. No thyroid enlargement, no tenderness.  LUNGS: Normal breath sounds bilaterally, no wheezing, rales,rhonchi or crepitation. No use of accessory muscles of respiration.  CARDIOVASCULAR: S1, S2 normal. No murmurs, rubs, or gallops.  ABDOMEN: Soft, nontender, nondistended. Bowel sounds present. No organomegaly or mass.  EXTREMITIES: No pedal edema, cyanosis, or clubbing.  NEUROLOGIC: Cranial nerves II through XII are intact. Muscle strength 5/5 in all extremities. Sensation intact. Gait not checked.  PSYCHIATRIC: The patient is alert and oriented x 3.  SKIN: No obvious rash, lesion, or ulcer.    LABORATORY PANEL:   CBC  Recent Labs Lab 10/07/16 1731  WBC 8.8  HGB 11.7*  HCT 35.7*  PLT 300   ------------------------------------------------------------------------------------------------------------------  Chemistries   Recent Labs Lab 10/07/16 1731  NA 136  K 4.2  CL 102  CO2 26  GLUCOSE 110*  BUN 21*  CREATININE 0.98  CALCIUM 9.4  AST 19  ALT 16*  ALKPHOS 113  BILITOT 0.6   ------------------------------------------------------------------------------------------------------------------  Cardiac Enzymes  Recent Labs Lab 10/07/16 1731  TROPONINI <0.03   ------------------------------------------------------------------------------------------------------------------  RADIOLOGY:  Dg Cervical Spine With Flex & Extend  Result Date: 10/09/2016 CLINICAL DATA:  Left-sided paresthesia and gait instability. Distance cervical fusion. Spinal stenosis C3-4. EXAM: CERVICAL SPINE COMPLETE WITH FLEXION AND EXTENSION VIEWS COMPARISON:  MRI 10/08/2016 FINDINGS: Previous ACDF from C4 through C6 with solid fusion. Degenerative spondylosis at C3-4 with disc space narrowing and endplate osteophytes. Flexion extension views do not  generated great deal of motion. No subluxation occurs. Bilateral foraminal stenosis because of  osteophytic encroachment at the C3-4 level, worse on the right than the left. Uncovertebral degeneration at C6-7 with foraminal encroachment. IMPRESSION: The patient does not generate a great deal of bending motion. No subluxation is seen at the C3-4 level with bending. Bony foraminal stenosis at C3-4 and C6-7. Electronically Signed   By: Nelson Chimes M.D.   On: 10/09/2016 11:06   Ct Cervical Spine Wo Contrast  Result Date: 10/09/2016 CLINICAL DATA:  Left-sided weakness and unsteady gait of 1 day duration. Previous cervical fusion. Adjacent segment degenerative changes. EXAM: CT CERVICAL SPINE WITHOUT CONTRAST TECHNIQUE: Multidetector CT imaging of the cervical spine was performed without intravenous contrast. Multiplanar CT image reconstructions were also generated. COMPARISON:  MRI 10/08/2016.  Radiography same day. FINDINGS: Alignment: Straightening of the normal cervical lordosis. Skull base and vertebrae: No primary bone lesion. See below for details at each level. Soft tissues and spinal canal: No significant soft tissue neck lesion. Atherosclerotic calcification at the carotid bifurcations. Disc levels: Foramen magnum widely patent. C1-2 shows ordinary osteoarthritis but no compressive stenosis. C2-3:  No significant finding. C3-4: Degenerative spondylosis with endplate osteophytes and bulging disc material. Spinal stenosis with AP diameter as narrow as 7 mm. Bilateral neural foraminal stenosis because of osteophytic encroachment. C4 through C6: Previous ACDF is solid. Sufficient patency of the canal and foramina throughout the segment. C6-7: Degenerative spondylosis with endplate osteophytes and bulging disc material. AP diameter as narrow as 8.5 mm. Bilateral neural foraminal encroachment by osteophytes could affect the C7 nerve roots. C7-T1:  Normal interspace. Upper thoracic region is unremarkable. Upper  chest: No active disease. Other: None IMPRESSION: Good appearance in the fusion segment from C4 through C6. Adjacent segment degenerative spondylosis at C3-4 and C6-7. Canal stenosis worse at the C3-4 than at C6-7. Bilateral neural foraminal stenosis at those levels that would have potential to compress the C4 and C7 nerve roots. Electronically Signed   By: Nelson Chimes M.D.   On: 10/09/2016 11:29   Mr Brain Wo Contrast  Result Date: 10/07/2016 CLINICAL DATA:  Left-sided weakness EXAM: MRI HEAD WITHOUT CONTRAST MRA HEAD WITHOUT CONTRAST TECHNIQUE: Multiplanar, multiecho pulse sequences of the brain and surrounding structures were obtained without intravenous contrast. Angiographic images of the head were obtained using MRA technique without contrast. COMPARISON:  Head CT 10/07/2016 FINDINGS: MRI HEAD FINDINGS Brain: The midline structures are normal. No focal diffusion restriction to indicate acute infarct. No intraparenchymal hemorrhage. Old left basal ganglia lacunar infarct. Mild periventricular white matter hyperintensity. No mass lesion. No chronic microhemorrhage or cerebral amyloid angiopathy. No hydrocephalus, age advanced atrophy or lobar predominant volume loss. No dural abnormality or extra-axial collection. Skull and upper cervical spine: The visualized skull base, calvarium, upper cervical spine and extracranial soft tissues are normal. Sinuses/Orbits: No fluid levels or advanced mucosal thickening. No mastoid effusion. Normal orbits. MRA HEAD FINDINGS Intracranial internal carotid arteries: Normal. Anterior cerebral arteries: Normal. Middle cerebral arteries: Normal. Posterior communicating arteries: Absent bilaterally. Posterior cerebral arteries: Normal. Basilar artery: Normal. Vertebral arteries: Codominant. Normal. Superior cerebellar arteries: Normal. Anterior inferior cerebellar arteries: Normal. Posterior inferior cerebellar arteries: Left dominant. IMPRESSION: 1. No acute intracranial  abnormality. 2. Old left basal ganglia lacunar infarct. 3. No intracranial arterial occlusion or flow limiting stenosis. Electronically Signed   By: Ulyses Jarred M.D.   On: 10/07/2016 22:57   Mr Cervical Spine Wo Contrast  Result Date: 10/08/2016 CLINICAL DATA:  Initial evaluation for left-sided numbness and weakness, gait difficulty. History of prior cervical spine fusion. EXAM: MRI  CERVICAL SPINE WITHOUT CONTRAST TECHNIQUE: Multiplanar, multisequence MR imaging of the cervical spine was performed. No intravenous contrast was administered. COMPARISON:  None available. FINDINGS: Alignment: Mild straightening of the normal cervical lordosis. Trace 2 mm retrolisthesis of C3 on C4. Vertebrae: Patient is status post ACDF at C4 through C6. There appears to be solid osseous fusion at these levels. Vertebral body heights maintained. No evidence for acute or chronic fracture. No discrete or worrisome osseous lesions. Bone marrow signal intensity normal. No abnormal marrow edema. Cord: Signal intensity within the cervical spinal cord is within normal limits. Posterior Fossa, vertebral arteries, paraspinal tissues: Visualized brain and posterior fossa within normal limits. Craniocervical junction normal. Paraspinous and prevertebral soft tissues normal. Normal intravascular flow voids present within the vertebral arteries bilaterally. Disc levels: C2-C3: Mild diffuse disc bulging, indenting the ventral thecal sac without significant canal stenosis. Mild bilateral facet degeneration. No significant neural foraminal stenosis. C3-C4: 2 mm retrolisthesis. Chronic diffuse degenerative disc osteophyte with intervertebral disc space narrowing. Broad posterior component flattens and effaces the ventral thecal sac. Secondary cord flattening without cord signal changes. Severe canal stenosis with the thecal sac measuring 6 mm in AP diameter. Severe bilateral C4 foraminal stenosis present as well. C4-C5: Status post fusion. No  residual canal stenosis. Residual uncovertebral hypertrophy with moderate left and mild right foraminal stenosis. C5-C6: Status post fusion. No residual canal stenosis. Residual left-sided uncovertebral disease with resultant mild left foraminal stenosis. No significant right foraminal encroachment C6-C7: Chronic diffuse degenerative disc osteophyte with intervertebral disc space narrowing. Broad posterior component flattens and partially effaces the ventral thecal sac. Mild ligamentum flavum hypertrophy. Moderate spinal stenosis. No significant cord deformity. Severe bilateral C7 foraminal stenosis, left slightly worse than right. C7-T1: Mild facet hypertrophy. No significant disc bulge. No stenosis. Visualized upper thoracic spine within normal limits. IMPRESSION: 1. Status post cervical fusion at C4 through C6 without residual canal stenosis. 2. Adjacent segment disease with degenerative disc osteophyte at C3-4 with resultant severe canal and bilateral C4 foraminal stenosis. 3. Diffuse degenerative disc osteophyte at C6-7 with resultant moderate canal and severe bilateral C7 foraminal stenosis. Electronically Signed   By: Jeannine Boga M.D.   On: 10/08/2016 14:15   US Carotid Bilateral (at [facility] And [facility] Only)  Result Date: 10/08/2016 CLINICAL DATA:  65 year old male with a history of stroke. Cardiovascular risk factors include hypertension, known prior stroke/TIA, coronary artery disease, hyperlipidemia, tobacco use EXAM: BILATERAL CAROTID DUPLEX ULTRASOUND TECHNIQUE: Pearline Cables scale imaging, color Doppler and duplex ultrasound were performed of bilateral carotid and vertebral arteries in the neck. COMPARISON:  05/22/2013 FINDINGS: Criteria: Quantification of carotid stenosis is based on velocity parameters that correlate the residual internal carotid diameter with NASCET-based stenosis levels, using the diameter of the distal internal carotid lumen as the denominator for stenosis measurement. The following  velocity measurements were obtained: RIGHT ICA:  Systolic 034 cm/sec, Diastolic 44 cm/sec CCA:  742 cm/sec SYSTOLIC ICA/CCA RATIO:  1.1 ECA:  98 cm/sec LEFT ICA:  Systolic 98 cm/sec, Diastolic 45 cm/sec CCA:  595 cm/sec SYSTOLIC ICA/CCA RATIO:  0.7 ECA:  122 cm/sec Right Brachial SBP: Not acquired Left Brachial SBP: Not acquired RIGHT CAROTID ARTERY: No significant calcifications of the right common carotid artery. Intermediate waveform maintained. Heterogeneous and partially calcified plaque at the right carotid bifurcation. No significant lumen shadowing. Low resistance waveform of the right ICA. No significant tortuosity. RIGHT VERTEBRAL ARTERY: Antegrade flow with low resistance waveform. LEFT CAROTID ARTERY: No significant calcifications of the left common carotid artery.  Intermediate waveform maintained. Heterogeneous and partially calcified plaque at the left carotid bifurcation without significant lumen shadowing. Low resistance waveform of the left ICA. No significant tortuosity. LEFT VERTEBRAL ARTERY:  Antegrade flow with low resistance waveform. IMPRESSION: Color duplex indicates minimal heterogeneous and calcified plaque, with no hemodynamically significant stenosis by duplex criteria in the extracranial cerebrovascular circulation. Signed, Dulcy Fanny. Earleen Newport, DO [facility] Electronically Signed   By: Corrie Mckusick D.O.   On: 10/08/2016 09:51   Mr Jodene Nam Head Wo Contrast  Result Date: 10/07/2016 CLINICAL DATA:  Left-sided weakness EXAM: MRI HEAD WITHOUT CONTRAST MRA HEAD WITHOUT CONTRAST TECHNIQUE: Multiplanar, multiecho pulse sequences of the brain and surrounding structures were obtained without intravenous contrast. Angiographic images of the head were obtained using MRA technique without contrast. COMPARISON:  Head CT 10/07/2016 FINDINGS: MRI HEAD FINDINGS Brain: The midline structures are normal. No focal diffusion restriction to indicate  acute infarct. No intraparenchymal hemorrhage. Old left basal ganglia lacunar infarct. Mild periventricular white matter hyperintensity. No mass lesion. No chronic microhemorrhage or cerebral amyloid angiopathy. No hydrocephalus, age advanced atrophy or lobar predominant volume loss. No dural abnormality or extra-axial collection. Skull and upper cervical spine: The visualized skull base, calvarium, upper cervical spine and extracranial soft tissues are normal. Sinuses/Orbits: No fluid levels or advanced mucosal thickening. No mastoid effusion. Normal orbits. MRA HEAD FINDINGS Intracranial internal carotid arteries: Normal. Anterior cerebral arteries: Normal. Middle cerebral arteries: Normal. Posterior communicating arteries: Absent bilaterally. Posterior cerebral arteries: Normal. Basilar artery: Normal. Vertebral arteries: Codominant. Normal. Superior cerebellar arteries: Normal. Anterior inferior cerebellar arteries: Normal. Posterior inferior cerebellar arteries: Left dominant. IMPRESSION: 1. No acute intracranial abnormality. 2. Old left basal ganglia lacunar infarct. 3. No intracranial arterial occlusion or flow limiting stenosis. Electronically Signed   By: Ulyses Jarred M.D.   On: 10/07/2016 22:57    EKG:   Orders placed or performed during the hospital encounter of 10/07/16  . ED EKG  . ED EKG    ASSESSMENT AND PLAN:    1. Cervical spinal cord myelopathy TIA suspected but now less likely as symptoms persists as per neurology :  CT head negative. MRI/MRA brain Negative Carotid Dopplers with no significant stenosis Echocardiogram. Results reviewed with EF 55%, some hypokinesis of inferior wall.  PTOT no recommendations - follow as out pt; Patient has passed bedside swallow evaluation  LDL 62  Continue aspirin and Plavix    Due to persistent symptoms, neurologist suggested MRI cervical spine. MRI of the C-spine with severe C3-C4 central canal stenosis , neurosurgery consulted  Suggested  C3-4 and C6-7 fusion surgery, for that need cardio clearance and anesthesia clearance.  2. Hypertension: Continue antihypertensive medication ramipril  3. CAD: Stable; continue aspirin and Plavix. Imdur per home regimen.     Called cardio consult as need clearance for spinal surgery.  4. OSA: The patient does not consistently wear CPAP at home which contributes to his risk for stroke. He agrees to use CPAP hospitalized.  5. COPD: Continue inhaled corticosteroid  6. DVT prophylaxis: Lovenox 7. GI prophylaxis: None   All the records are reviewed and case discussed with Care Management/Social Workerr. Management plans discussed with the patient, family and they are in agreement.  CODE STATUS: fc   TOTAL TIME TAKING CARE OF THIS PATIENT: 45 minutes.   POSSIBLE D/C IN 1  DAYS, DEPENDING ON CLINICAL CONDITION.  Note: This dictation was prepared with Dragon dictation along with smaller phrase technology. Any transcriptional errors that result  from this process are unintentional.   Vaughan Basta M.D on 10/09/2016 at 9:27 PM  Between 7am to 6pm - Pager - [phone] After 6pm go to [email or web address] - password [email or web address] [facility]  [facility]  Office  [phone]  CC: Primary care physician; Kirk Ruths, MD

## 2016-10-09 NOTE — Consult Note (Signed)
Referring Physician:  No referring provider defined for this encounter.  Primary Physician:  Kirk Ruths, MD  Chief Complaint:  Dizziness, left-sided numbness  History of Present Illness: Louis Gutierrez is a 66 y.o. male who presents as an inpatient consult for left-sided paresthesia and gait instability. Presented to [facility] walk-in clinic for dizziness and was admitted to hospital for potential stroke workup. Symptoms resembled previous experience which resulted in cervical fusion 17 years ago. Current MRI was obtained and revealed spinal cord compression superior to previous fusion at C3-C4.  Patient has noticed worsening issues with balance and gait for approximately 1 month. Complains of left sided numbness that is more prominent in the lower extremity but notes milder numbness and weakness in upper extremity. Lower extremity symptoms locations are medial thigh, lateral calf, and entire foot. He is experiencing frequent cramping in calf and occasional foot drop for several months. Denies saddle paresthesia, bladder/bowel dysfunction, recent falls, trauma, .   Also experiencing neck pain that is worse in flexion. For several months he has experienced difficulty in grasping small object and fine motor movements.  Overall pain is rated 6-7/10.   [name] has symptoms of cervical myelopathy.  The symptoms are causing a significant impact on the patient's life.   Review of Systems:  A 10 point review of systems is negative, except for the pertinent positives and negatives detailed in the HPI.  Past Medical History: Past Medical History:  Diagnosis Date  . Asthma without status asthmaticus   . Atherosclerotic cerebrovascular disease 06/09/2014   Overview:  Small and facial weakness with minimal weakness   . CAD (coronary artery disease)   . Cerebral vascular accident ([place])   . Chronic obstructive pulmonary disease ([place]) 06/09/2014   Last Assessment & Plan:  The patient  shortness of breath is generally unchanged and treatment is tolerated.     Marland Kitchen COPD (chronic obstructive pulmonary disease) ([place])   . CVA (cerebral vascular accident) ([place]) 2015  . Depression   . Essential (primary) hypertension 06/09/2014   Last Assessment & Plan:  Patients blood pressure has seemingly been controlled without significant side effects such as dizziness or slow heart rate.     . H/O insomnia   . Hypertension 06/10/2010   ECHO at [facility] - see Media tab  . Mixed hyperlipidemia   . Nephrolithiasis   . Obesity   . OSA (obstructive sleep apnea)   . Osteoarthritis   . STEMI (ST elevation myocardial infarction) ([place]) 06/07/2010  . Stroke of unknown cause ([place]) 08/06/2013  . Tobacco abuse   . Urinary incontinence   . Varicose veins     Past Surgical History: Past Surgical History:  Procedure Laterality Date  . BACK SURGERY  2003   C3C4  . CARDIAC CATHETERIZATION  06/07/2010   50 % ostial narrowing in 1st diagonal from LAD, 2nd diagonal had 40% ostial narrowing; AV groove circumflex was diminutive and occluded proximally; RCA totally occluded in mid segment - stented with 3.5 x 50mm Promus Element DES  - See Media tab  . CARDIAC CATHETERIZATION  05/2013   [place]  . COLONOSCOPY WITH PROPOFOL N/A 02/26/2015   Procedure: COLONOSCOPY WITH PROPOFOL;  Surgeon: Josefine Class, MD;  Location: [facility] ENDOSCOPY;  Service: Endoscopy;  Laterality: N/A;  . ESOPHAGOGASTRODUODENOSCOPY  02/26/2015   Procedure: ESOPHAGOGASTRODUODENOSCOPY (EGD);  Surgeon: Josefine Class, MD;  Location: [facility] ENDOSCOPY;  Service: Endoscopy;;  . LOOP RECORDER IMPLANT  08/16/13   Dr. Sallyanne Kuster  Allergies: Allergies as of 10/07/2016 - Review Complete 10/07/2016  Allergen Reaction Noted  . Penicillins Hives 06/20/2012    Medications:  Current Facility-Administered Medications:  .  acetaminophen (TYLENOL) tablet 650 mg, 650 mg, Oral, Q4H PRN **OR** acetaminophen (TYLENOL) solution 650 mg, 650 mg, Per Tube, Q4H  PRN **OR** acetaminophen (TYLENOL) suppository 650 mg, 650 mg, Rectal, Q4H PRN, Harrie Foreman, MD .  aspirin EC tablet 81 mg, 81 mg, Oral, Daily, Harrie Foreman, MD, 81 mg at 10/09/16 9518 .  clopidogrel (PLAVIX) tablet 75 mg, 75 mg, Oral, Daily, Harrie Foreman, MD, 75 mg at 10/09/16 0807 .  enoxaparin (LOVENOX) injection 40 mg, 40 mg, Subcutaneous, Q24H, Harrie Foreman, MD, 40 mg at 10/08/16 2119 .  ipratropium-albuterol (DUONEB) 0.5-2.5 (3) MG/3ML nebulizer solution 3 mL, 3 mL, Inhalation, BID, Harrie Foreman, MD, 3 mL at 10/09/16 0755 .  isosorbide mononitrate (IMDUR) 24 hr tablet 30 mg, 30 mg, Oral, Daily, Harrie Foreman, MD, 30 mg at 10/09/16 0807 .  mometasone-formoterol (DULERA) 100-5 MCG/ACT inhaler 2 puff, 2 puff, Inhalation, BID, Harrie Foreman, MD, 2 puff at 10/09/16 [phone] .  nitroGLYCERIN (NITROSTAT) SL tablet 0.4 mg, 0.4 mg, Sublingual, Q5 min PRN, Harrie Foreman, MD .  PARoxetine (PAXIL) tablet 40 mg, 40 mg, Oral, Daily, Harrie Foreman, MD, 40 mg at 10/09/16 6063 .  pravastatin (PRAVACHOL) tablet 40 mg, 40 mg, Oral, Daily, Harrie Foreman, MD, 40 mg at 10/09/16 0807 .  ramipril (ALTACE) capsule 10 mg, 10 mg, Oral, Daily, Harrie Foreman, MD, 10 mg at 10/09/16 0160 .  senna-docusate (Senokot-S) tablet 1 tablet, 1 tablet, Oral, QHS PRN, Harrie Foreman, MD .  traZODone (DESYREL) tablet 50-100 mg, 50-100 mg, Oral, QHS PRN, Harrie Foreman, MD, 50 mg at 10/09/16 0201   Social History: Social History  Substance Use Topics  . Smoking status: Current Every Day Smoker    Packs/day: 1.00    Years: 53.00    Types: Cigarettes  . Smokeless tobacco: Never Used  . Alcohol use No    Family Medical History: Family History  Problem Relation Age of Onset  . Cancer Mother        oral  . Stroke Maternal Grandmother   . Diabetes Brother   . Coronary artery disease Brother     Physical Examination: Vitals:   10/09/16 0358 10/09/16 0805   BP: 134/75 121/77  Pulse: 88 84  Resp: 18 18  Temp: 98.9 F (37.2 C) 98.6 F (37 C)  SpO2: 96% 99%     General: Patient is well developed, well nourished, calm, collected, and in no apparent distress.  Psychiatric: Patient is non-anxious.  Head:  Pupils equal, round, and reactive to light.  Neck:   Supple.  Full range of motion.  Respiratory: Patient is breathing without any difficulty.  Extremities: No edema.  Vascular: Palpable pulses in dorsal pedal vessels.  Skin:   On exposed skin, there are no abnormal skin lesions.  NEUROLOGICAL:  General: In no acute distress.   Awake, alert, oriented to person, place, and time.  Pupils equal round and reactive to light.  Facial tone is symmetric.  There is no pronator drift.  ROM of spine: Limited on flexion.  Palpation of spine: tenderness through posterior cervical region.    Strength: Side Biceps Triceps Deltoid Interossei Grip Wrist Ext. Wrist Flex.  R 5 5 5 5 5 5 5   L 5 5 5 5 5 5  5  Side Iliopsoas Quads Hamstring PF DF EHL  R 5 5 5 5 5 5   L 5 5 5 5 5 5    Reflexes are 2+ and symmetric at the biceps, triceps, brachioradialis: 3+ and symmetric at patella and achilles.   Decreased sensation on left side especially in hands and feet.  Clonus present in right lower extremity.  Toes are down-going.  Gait not assessed.  Hoffman's positive on right.   Imaging:  EXAM: MRI CERVICAL SPINE WITHOUT CONTRAST  TECHNIQUE: Multiplanar, multisequence MR imaging of the cervical spine was performed. No intravenous contrast was administered.  COMPARISON:  None available.  FINDINGS: Alignment: Mild straightening of the normal cervical lordosis. Trace 2 mm retrolisthesis of C3 on C4.  Vertebrae: Patient is status post ACDF at C4 through C6. There appears to be solid osseous fusion at these levels. Vertebral body heights maintained. No evidence for acute or chronic fracture. No discrete or worrisome osseous lesions. Bone  marrow signal intensity normal. No abnormal marrow edema.  Cord: Signal intensity within the cervical spinal cord is within normal limits.  Posterior Fossa, vertebral arteries, paraspinal tissues: Visualized brain and posterior fossa within normal limits. Craniocervical junction normal. Paraspinous and prevertebral soft tissues normal. Normal intravascular flow voids present within the vertebral arteries bilaterally.  Disc levels:  C2-C3: Mild diffuse disc bulging, indenting the ventral thecal sac without significant canal stenosis. Mild bilateral facet degeneration. No significant neural foraminal stenosis.  C3-C4: 2 mm retrolisthesis. Chronic diffuse degenerative disc osteophyte with intervertebral disc space narrowing. Broad posterior component flattens and effaces the ventral thecal sac. Secondary cord flattening without cord signal changes. Severe canal stenosis with the thecal sac measuring 6 mm in AP diameter. Severe bilateral C4 foraminal stenosis present as well.  C4-C5: Status post fusion. No residual canal stenosis. Residual uncovertebral hypertrophy with moderate left and mild right foraminal stenosis.  C5-C6: Status post fusion. No residual canal stenosis. Residual left-sided uncovertebral disease with resultant mild left foraminal stenosis. No significant right foraminal encroachment  C6-C7: Chronic diffuse degenerative disc osteophyte with intervertebral disc space narrowing. Broad posterior component flattens and partially effaces the ventral thecal sac. Mild ligamentum flavum hypertrophy. Moderate spinal stenosis. No significant cord deformity. Severe bilateral C7 foraminal stenosis, left slightly worse than right.  C7-T1: Mild facet hypertrophy. No significant disc bulge. No stenosis.  Visualized upper thoracic spine within normal limits.  IMPRESSION: 1. Status post cervical fusion at C4 through C6 without residual canal stenosis. 2.  Adjacent segment disease with degenerative disc osteophyte at C3-4 with resultant severe canal and bilateral C4 foraminal stenosis. 3. Diffuse degenerative disc osteophyte at C6-7 with resultant moderate canal and severe bilateral C7 foraminal stenosis.   Electronically Signed   By: Jeannine Boga M.D.   On: 10/08/2016 14:15  I have personally reviewed the images and agree with the above interpretation.  Assessment and Plan: Mr. Moroney is a pleasant 66 y.o. male with cervical myelopathy. Recommend additional imaging of cervical flexion/extension xrays and also CT for further evaluation. Dr. Izora Ribas will see patient after imaging is available to discuss options.    Marin Olp, PA-C Dept. of Neurosurgery

## 2016-10-10 ENCOUNTER — Telehealth: Payer: Self-pay | Admitting: Cardiovascular Disease

## 2016-10-10 ENCOUNTER — Encounter: Payer: Self-pay | Admitting: Nurse Practitioner

## 2016-10-10 DIAGNOSIS — M4802 Spinal stenosis, cervical region: Secondary | ICD-10-CM

## 2016-10-10 DIAGNOSIS — I639 Cerebral infarction, unspecified: Secondary | ICD-10-CM | POA: Diagnosis not present

## 2016-10-10 DIAGNOSIS — I25118 Atherosclerotic heart disease of native coronary artery with other forms of angina pectoris: Secondary | ICD-10-CM | POA: Diagnosis not present

## 2016-10-10 DIAGNOSIS — M4800 Spinal stenosis, site unspecified: Secondary | ICD-10-CM

## 2016-10-10 DIAGNOSIS — I2510 Atherosclerotic heart disease of native coronary artery without angina pectoris: Secondary | ICD-10-CM | POA: Diagnosis not present

## 2016-10-10 DIAGNOSIS — G4733 Obstructive sleep apnea (adult) (pediatric): Secondary | ICD-10-CM | POA: Diagnosis not present

## 2016-10-10 DIAGNOSIS — I10 Essential (primary) hypertension: Secondary | ICD-10-CM | POA: Diagnosis not present

## 2016-10-10 DIAGNOSIS — G959 Disease of spinal cord, unspecified: Secondary | ICD-10-CM

## 2016-10-10 DIAGNOSIS — R42 Dizziness and giddiness: Secondary | ICD-10-CM | POA: Diagnosis not present

## 2016-10-10 NOTE — Consult Note (Signed)
Cardiology Consult    Patient ID: Louis Gutierrez MRN: [id number], DOB/AGE: April 11, 1950   Admit date: 10/07/2016 Date of Consult: 10/10/2016  Primary Physician: Kirk Ruths, MD Primary Cardiologist: Jerilynn Mages. Croitoru, MD  Requesting Provider: Doylene Canning, MD  Patient Profile    Louis Gutierrez is a 66 y.o. male with a history of CAD s/p inf STEMI and RCA stenting, cryptogenic CVA, HTN, HL, tob abuse, and cervical disc dzs, who is being seen today for preoperative cardiovascular eval at the request of Dr. Anselm Jungling.  Past Medical History   Past Medical History:  Diagnosis Date  . Asthma without status asthmaticus   . Atherosclerotic cerebrovascular disease    Overview:  Small and facial weakness with minimal weakness   . CAD (coronary artery disease)    a. 05/2010 Inf STEMI/PCI: RCA 156m (3.5x24 Promus DES), LCX small, 100, D1 50ost, D2 40ost, EF 45-50%; b. 03/2013 Cath ([facility]): Report not available - D/C summary says no significant dzs.  . Cervical disc disease    a. s/p C3-4 fusion 2003.  Marland Kitchen Cervical myelopathy ([place])    a. 09/2016 Admit w/ left sided wkns->found to have progressive cervical disc dzs above and below prior fusioin site.  Marland Kitchen COPD (chronic obstructive pulmonary disease) ([place])   . Cryptogenic stroke ([place])    a. 05/2013 s/p acute L MCA territory infarct; b. 07/2013 s/p MDT Linq ILR-->No AFib to documented to date (09/2016).  . Depression   . Essential (primary) hypertension   . H/O insomnia   . Ischemic cardiomyopathy    a. 05/2010 LV gram: EF 45-50% @ time of inf MI; b. 09/2016 Echo: EF 50-55%, inf HK, Gr1 DD, mild MR.  . Mixed hyperlipidemia   . Nephrolithiasis   . Obesity   . OSA (obstructive sleep apnea)   . Osteoarthritis   . STEMI (ST elevation myocardial infarction) ([place]) 06/07/2010  . Tobacco abuse   . Urinary incontinence   . Varicose veins     Past Surgical History:  Procedure Laterality Date  . BACK SURGERY  2003   C3C4  . CARDIAC CATHETERIZATION   06/07/2010   50 % ostial narrowing in 1st diagonal from LAD, 2nd diagonal had 40% ostial narrowing; AV groove circumflex was diminutive and occluded proximally; RCA totally occluded in mid segment - stented with 3.5 x 26mm Promus Element DES  - See Media tab  . CARDIAC CATHETERIZATION  05/2013   [place]  . COLONOSCOPY WITH PROPOFOL N/A 02/26/2015   Procedure: COLONOSCOPY WITH PROPOFOL;  Surgeon: Josefine Class, MD;  Location: [facility] ENDOSCOPY;  Service: Endoscopy;  Laterality: N/A;  . ESOPHAGOGASTRODUODENOSCOPY  02/26/2015   Procedure: ESOPHAGOGASTRODUODENOSCOPY (EGD);  Surgeon: Josefine Class, MD;  Location: [facility] ENDOSCOPY;  Service: Endoscopy;;  . LOOP RECORDER IMPLANT  08/16/13   Dr. Sallyanne Kuster     Allergies  Allergies  Allergen Reactions  . Penicillins Hives    Has patient had a PCN reaction causing immediate rash, facial/tongue/throat swelling, SOB or lightheadedness with hypotension: No Has patient had a PCN reaction causing severe rash involving mucus membranes or skin necrosis: No Has patient had a PCN reaction that required hospitalization No Has patient had a PCN reaction occurring within the last 10 years: No If all of the above answers are "NO", then may proceed with Cephalosporin use.     History of Present Illness    66 y/o ? with the above complex PMH including CAD, ICM, HTN, HL, tob abuse, COPD, cryptogenic CVA, and cervical  disc dzs s/p prior cervical fusion.  Cardiac history dates back to May 2012, when he suffered an inferior ST segment elevation myocardial infarction. He was found to have a total occlusion of the right coronary artery which was successfully treated with drug-eluting stent. He was also noted to have a total occlusion of a small left circumflex, and otherwise nonobstructive disease in diagonal branches.  He underwent repeat catheterization March 2015 in the setting of chest pain. This reportedly showed nonobstructive disease and he was medically managed.  In May 2015, he suffered a cryptogenic, left MCA territory stroke. He subsequently underwent implantable loop recorder placement in July 2015. To date, this has never shown any significant arrhythmias/atrial fibrillation.  He was last seen in clinic in January of this year, at which time he was doing well.  Most recent device interrogation was in June, which again did not show any significant arrhythmia.  Patient lives locally and works for a [employer]. 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 He walks his dog twice a day for 30 minutes each time, without any chest pain, dyspnea, or exercise intolerance. He says he can probably walk for 45 minutes or more without stopping to rest.on September 18th, he awoke with unsteady gait and practically fell out of bed. He says he felt drunk. He was lightheaded with position changes and also noted his gait would drift to the left when walking. He initially had reported left-sided numbness and weakness, though he denies that this morning. Regardless, he presented to work and was found to have a low blood pressure and he was sent along to the emergency department. There, head CT did not show acute changes. He was seen by neurology. Carotid ultrasound did not show any significant disease. MRI/A showed: no acute intracranial abnormality; Old left basal ganglial lacunar infarct; No intracranial arterial occlusion or flow-limiting stenosis.  MR of the cervical spine showed progressive C3-4 and C6-7 disease with moderate canal and severe bilateral C7 foraminal stenosis. He has been evaluated by neurosurgery with plans for cervical disc surgery October 3. Cardiology has been asked to evaluate for preoperative clearance. Patient has not been on telemetry. He denies any acute cardiac issues during this hospitalization.  Inpatient Medications    . aspirin EC  81 mg Oral Daily  . enoxaparin (LOVENOX) injection  40 mg Subcutaneous Q24H  .  ipratropium-albuterol  3 mL Inhalation BID  . isosorbide mononitrate  30 mg Oral Daily  . mometasone-formoterol  2 puff Inhalation BID  . PARoxetine  40 mg Oral Daily  . pravastatin  40 mg Oral Daily  . ramipril  10 mg Oral Daily    Family History    Family History  Problem Relation Age of Onset  . Cancer Mother        oral  . Stroke Maternal Grandmother   . Diabetes Brother   . Coronary artery disease Brother     Social History    Social History   Social History  . Marital status: Married    Spouse name: N/A  . Number of children: N/A  . Years of education: N/A   Occupational History  . 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   Social History Main Topics  . Smoking status: Current Every Day Smoker    Packs/day: 1.00    Years: 53.00    Types: Cigarettes  . Smokeless tobacco: Never Used     Comment:  trying to cut back - currently smoking 0.5 ppd.  . Alcohol use No  . Drug use: No  . Sexual activity: Not on file   Other Topics Concern  . Not on file   Social History Narrative   Lives in [place] with wife, dtr, and 2 grandchildren.  Walks dog 30 mins 2x/day w/o difficulties.     Review of Systems    General:  No chills, fever, night sweats or weight changes.  Cardiovascular:  No chest pain, dyspnea on exertion, edema, orthopnea, palpitations, paroxysmal nocturnal dyspnea. Dermatological: No rash, lesions/masses Respiratory: No cough, dyspnea Urologic: No hematuria, dysuria Abdominal:   No nausea, vomiting, diarrhea, bright red blood per rectum, melena, or hematemesis Neurologic:  +++ left sided wkns/paresthesias.  Altered gait 9/18 - "felt drunk."  No visual changes, wkns, changes in mental status. All other systems reviewed and are otherwise negative except as noted above.  Physical Exam    Blood pressure 130/69, pulse 86, temperature 98 F (36.7 C), resp. rate 20, height 5\' 11"  (1.803 m), weight 210 lb 4 oz (95.4 kg), SpO2 98 %.   General: Pleasant, NAD Psych: Normal affect. Neuro: Alert and oriented X 3. Moves all extremities spontaneously. HEENT: Normal  Neck: Supple without bruits or JVD. Lungs:  Resp regular and unlabored, diminished breath sounds bilaterally Heart: RRR no s3, s4, or murmurs. Abdomen: Soft, non-tender, non-distended, BS + x 4.  Extremities: No clubbing, cyanosis or edema. DP/PT/Radials 2+ and equal bilaterally.  Labs     Recent Labs  10/07/16 1731  TROPONINI <0.03   Lab Results  Component Value Date   WBC 8.8 10/07/2016   HGB 11.7 (L) 10/07/2016   HCT 35.7 (L) 10/07/2016   MCV 76.7 (L) 10/07/2016   PLT 300 10/07/2016     Recent Labs Lab 10/07/16 1731  NA 136  K 4.2  CL 102  CO2 26  BUN 21*  CREATININE 0.98  CALCIUM 9.4  PROT 7.3  BILITOT 0.6  ALKPHOS 113  ALT 16*  AST 19  GLUCOSE 110*   Lab Results  Component Value Date   CHOL 116 10/08/2016   HDL 37 (L) 10/08/2016   LDLCALC 62 10/08/2016   TRIG 87 10/08/2016     Radiology Studies    Dg Cervical Spine With Flex & Extend  Result Date: 10/09/2016 CLINICAL DATA:  Left-sided paresthesia and gait instability. Distance cervical fusion. Spinal stenosis C3-4. EXAM: CERVICAL SPINE COMPLETE WITH FLEXION AND EXTENSION VIEWS COMPARISON:  MRI 10/08/2016 FINDINGS: Previous ACDF from C4 through C6 with solid fusion. Degenerative spondylosis at C3-4 with disc space narrowing and endplate osteophytes. Flexion extension views do not generated great deal of motion. No subluxation occurs. Bilateral foraminal stenosis because of osteophytic encroachment at the C3-4 level, worse on the right than the left. Uncovertebral degeneration at C6-7 with foraminal encroachment. IMPRESSION: The patient does not generate a great deal of bending motion. No subluxation is seen at the C3-4 level with bending. Bony foraminal stenosis at C3-4 and C6-7. Electronically Signed   By: Nelson Chimes M.D.   On: 10/09/2016 11:06   Ct Head Wo  Contrast  Result Date: 10/07/2016 CLINICAL DATA:  Severe headache EXAM: CT HEAD WITHOUT CONTRAST TECHNIQUE: Contiguous axial images were obtained from the base of the skull through the vertex without intravenous contrast. COMPARISON:  12/05/2013 FINDINGS: Brain: Remote left basal ganglia infarct as before. No acute intracranial hemorrhage, mass lesion, new infarction, midline shift, herniation, hydrocephalus, or extra-axial fluid collection. No focal mass  effect or edema. Cisterns are patent. No cerebellar abnormality. Vascular: No hyperdense vessel or unexpected calcification. Skull: Normal. Negative for fracture or focal lesion. Sinuses/Orbits: No acute finding. Other: None. IMPRESSION: No acute intracranial abnormality by noncontrast CT. Remote left basal ganglia lacunar-type infarct as before Electronically Signed   By: Jerilynn Mages.  Shick M.D.   On: 10/07/2016 17:43   Ct Cervical Spine Wo Contrast  Result Date: 10/09/2016 CLINICAL DATA:  Left-sided weakness and unsteady gait of 1 day duration. Previous cervical fusion. Adjacent segment degenerative changes. EXAM: CT CERVICAL SPINE WITHOUT CONTRAST TECHNIQUE: Multidetector CT imaging of the cervical spine was performed without intravenous contrast. Multiplanar CT image reconstructions were also generated. COMPARISON:  MRI 10/08/2016.  Radiography same day. FINDINGS: Alignment: Straightening of the normal cervical lordosis. Skull base and vertebrae: No primary bone lesion. See below for details at each level. Soft tissues and spinal canal: No significant soft tissue neck lesion. Atherosclerotic calcification at the carotid bifurcations. Disc levels: Foramen magnum widely patent. C1-2 shows ordinary osteoarthritis but no compressive stenosis. C2-3:  No significant finding. C3-4: Degenerative spondylosis with endplate osteophytes and bulging disc material. Spinal stenosis with AP diameter as narrow as 7 mm. Bilateral neural foraminal stenosis because of osteophytic  encroachment. C4 through C6: Previous ACDF is solid. Sufficient patency of the canal and foramina throughout the segment. C6-7: Degenerative spondylosis with endplate osteophytes and bulging disc material. AP diameter as narrow as 8.5 mm. Bilateral neural foraminal encroachment by osteophytes could affect the C7 nerve roots. C7-T1:  Normal interspace. Upper thoracic region is unremarkable. Upper chest: No active disease. Other: None IMPRESSION: Good appearance in the fusion segment from C4 through C6. Adjacent segment degenerative spondylosis at C3-4 and C6-7. Canal stenosis worse at the C3-4 than at C6-7. Bilateral neural foraminal stenosis at those levels that would have potential to compress the C4 and C7 nerve roots. Electronically Signed   By: Nelson Chimes M.D.   On: 10/09/2016 11:29   Mr Brain Wo Contrast  Result Date: 10/07/2016 CLINICAL DATA:  Left-sided weakness EXAM: MRI HEAD WITHOUT CONTRAST MRA HEAD WITHOUT CONTRAST TECHNIQUE: Multiplanar, multiecho pulse sequences of the brain and surrounding structures were obtained without intravenous contrast. Angiographic images of the head were obtained using MRA technique without contrast. COMPARISON:  Head CT 10/07/2016 FINDINGS: MRI HEAD FINDINGS Brain: The midline structures are normal. No focal diffusion restriction to indicate acute infarct. No intraparenchymal hemorrhage. Old left basal ganglia lacunar infarct. Mild periventricular white matter hyperintensity. No mass lesion. No chronic microhemorrhage or cerebral amyloid angiopathy. No hydrocephalus, age advanced atrophy or lobar predominant volume loss. No dural abnormality or extra-axial collection. Skull and upper cervical spine: The visualized skull base, calvarium, upper cervical spine and extracranial soft tissues are normal. Sinuses/Orbits: No fluid levels or advanced mucosal thickening. No mastoid effusion. Normal orbits. MRA HEAD FINDINGS Intracranial internal carotid arteries: Normal. Anterior  cerebral arteries: Normal. Middle cerebral arteries: Normal. Posterior communicating arteries: Absent bilaterally. Posterior cerebral arteries: Normal. Basilar artery: Normal. Vertebral arteries: Codominant. Normal. Superior cerebellar arteries: Normal. Anterior inferior cerebellar arteries: Normal. Posterior inferior cerebellar arteries: Left dominant. IMPRESSION: 1. No acute intracranial abnormality. 2. Old left basal ganglia lacunar infarct. 3. No intracranial arterial occlusion or flow limiting stenosis. Electronically Signed   By: Ulyses Jarred M.D.   On: 10/07/2016 22:57   Mr Cervical Spine Wo Contrast  Result Date: 10/08/2016 CLINICAL DATA:  Initial evaluation for left-sided numbness and weakness, gait difficulty. History of prior cervical spine fusion. EXAM: MRI CERVICAL SPINE WITHOUT  CONTRAST TECHNIQUE: Multiplanar, multisequence MR imaging of the cervical spine was performed. No intravenous contrast was administered. COMPARISON:  None available. FINDINGS: Alignment: Mild straightening of the normal cervical lordosis. Trace 2 mm retrolisthesis of C3 on C4. Vertebrae: Patient is status post ACDF at C4 through C6. There appears to be solid osseous fusion at these levels. Vertebral body heights maintained. No evidence for acute or chronic fracture. No discrete or worrisome osseous lesions. Bone marrow signal intensity normal. No abnormal marrow edema. Cord: Signal intensity within the cervical spinal cord is within normal limits. Posterior Fossa, vertebral arteries, paraspinal tissues: Visualized brain and posterior fossa within normal limits. Craniocervical junction normal. Paraspinous and prevertebral soft tissues normal. Normal intravascular flow voids present within the vertebral arteries bilaterally. Disc levels: C2-C3: Mild diffuse disc bulging, indenting the ventral thecal sac without significant canal stenosis. Mild bilateral facet degeneration. No significant neural foraminal stenosis. C3-C4: 2 mm  retrolisthesis. Chronic diffuse degenerative disc osteophyte with intervertebral disc space narrowing. Broad posterior component flattens and effaces the ventral thecal sac. Secondary cord flattening without cord signal changes. Severe canal stenosis with the thecal sac measuring 6 mm in AP diameter. Severe bilateral C4 foraminal stenosis present as well. C4-C5: Status post fusion. No residual canal stenosis. Residual uncovertebral hypertrophy with moderate left and mild right foraminal stenosis. C5-C6: Status post fusion. No residual canal stenosis. Residual left-sided uncovertebral disease with resultant mild left foraminal stenosis. No significant right foraminal encroachment C6-C7: Chronic diffuse degenerative disc osteophyte with intervertebral disc space narrowing. Broad posterior component flattens and partially effaces the ventral thecal sac. Mild ligamentum flavum hypertrophy. Moderate spinal stenosis. No significant cord deformity. Severe bilateral C7 foraminal stenosis, left slightly worse than right. C7-T1: Mild facet hypertrophy. No significant disc bulge. No stenosis. Visualized upper thoracic spine within normal limits. IMPRESSION: 1. Status post cervical fusion at C4 through C6 without residual canal stenosis. 2. Adjacent segment disease with degenerative disc osteophyte at C3-4 with resultant severe canal and bilateral C4 foraminal stenosis. 3. Diffuse degenerative disc osteophyte at C6-7 with resultant moderate canal and severe bilateral C7 foraminal stenosis. Electronically Signed   By: Jeannine Boga M.D.   On: 10/08/2016 14:15   US Carotid Bilateral (at [facility] Only)  Result Date: 10/08/2016 CLINICAL DATA:  66 year old male with a history of stroke. Cardiovascular risk factors include hypertension, known prior stroke/TIA, coronary artery disease, hyperlipidemia, tobacco use EXAM: BILATERAL CAROTID DUPLEX ULTRASOUND TECHNIQUE: Pearline Cables scale imaging, color Doppler and duplex ultrasound  were performed of bilateral carotid and vertebral arteries in the neck. COMPARISON:  05/22/2013 FINDINGS: Criteria: Quantification of carotid stenosis is based on velocity parameters that correlate the residual internal carotid diameter with NASCET-based stenosis levels, using the diameter of the distal internal carotid lumen as the denominator for stenosis measurement. The following velocity measurements were obtained: RIGHT ICA:  Systolic 268 cm/sec, Diastolic 44 cm/sec CCA:  341 cm/sec SYSTOLIC ICA/CCA RATIO:  1.1 ECA:  98 cm/sec LEFT ICA:  Systolic 98 cm/sec, Diastolic 45 cm/sec CCA:  962 cm/sec SYSTOLIC ICA/CCA RATIO:  0.7 ECA:  122 cm/sec Right Brachial SBP: Not acquired Left Brachial SBP: Not acquired RIGHT CAROTID ARTERY: No significant calcifications of the right common carotid artery. Intermediate waveform maintained. Heterogeneous and partially calcified plaque at the right carotid bifurcation. No significant lumen shadowing. Low resistance waveform of the right ICA. No significant tortuosity. RIGHT VERTEBRAL ARTERY: Antegrade flow with low resistance waveform. LEFT CAROTID ARTERY: No significant calcifications of the left common carotid artery. Intermediate waveform maintained.  Heterogeneous and partially calcified plaque at the left carotid bifurcation without significant lumen shadowing. Low resistance waveform of the left ICA. No significant tortuosity. LEFT VERTEBRAL ARTERY:  Antegrade flow with low resistance waveform. IMPRESSION: Color duplex indicates minimal heterogeneous and calcified plaque, with no hemodynamically significant stenosis by duplex criteria in the extracranial cerebrovascular circulation. Signed, Dulcy Fanny. Earleen Newport, DO [facility] Electronically Signed   By: Corrie Mckusick D.O.   On: 10/08/2016 09:51   Mr Jodene Nam Head Wo Contrast  Result Date: 10/07/2016 CLINICAL DATA:  Left-sided weakness EXAM: MRI HEAD WITHOUT CONTRAST MRA  HEAD WITHOUT CONTRAST TECHNIQUE: Multiplanar, multiecho pulse sequences of the brain and surrounding structures were obtained without intravenous contrast. Angiographic images of the head were obtained using MRA technique without contrast. COMPARISON:  Head CT 10/07/2016 FINDINGS: MRI HEAD FINDINGS Brain: The midline structures are normal. No focal diffusion restriction to indicate acute infarct. No intraparenchymal hemorrhage. Old left basal ganglia lacunar infarct. Mild periventricular white matter hyperintensity. No mass lesion. No chronic microhemorrhage or cerebral amyloid angiopathy. No hydrocephalus, age advanced atrophy or lobar predominant volume loss. No dural abnormality or extra-axial collection. Skull and upper cervical spine: The visualized skull base, calvarium, upper cervical spine and extracranial soft tissues are normal. Sinuses/Orbits: No fluid levels or advanced mucosal thickening. No mastoid effusion. Normal orbits. MRA HEAD FINDINGS Intracranial internal carotid arteries: Normal. Anterior cerebral arteries: Normal. Middle cerebral arteries: Normal. Posterior communicating arteries: Absent bilaterally. Posterior cerebral arteries: Normal. Basilar artery: Normal. Vertebral arteries: Codominant. Normal. Superior cerebellar arteries: Normal. Anterior inferior cerebellar arteries: Normal. Posterior inferior cerebellar arteries: Left dominant. IMPRESSION: 1. No acute intracranial abnormality. 2. Old left basal ganglia lacunar infarct. 3. No intracranial arterial occlusion or flow limiting stenosis. Electronically Signed   By: Ulyses Jarred M.D.   On: 10/07/2016 22:57    ECG & Cardiac Imaging    RSR, 85, inc RBBB, inf infarct.  Assessment & Plan    1. Preoperative cardiovascular evaluation/coronary artery disease: Status post prior inferior ST segment elevation myocardial infarction in 2012 stenting of right coronary artery. He has a known occlusion of the small left circumflex and had  moderate diagonal disease at that time. Subsequent catheterization in March 2015 reportedly did not show any significant stenosis. He has been doing exceptionally well from a cardiac standpoint, walking 60 minutes daily with his dog without any symptoms or limitations. 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 Given good exercise tolerance and any recent symptoms along with, normal LV function by echocardiogram this admission, he does not require any further ischemic evaluation. He has been on aspirin and Plavix chronically and I note, that his Plavix has already been held during hospital physician. As he is greater than 12 months since his drug-eluting stent placement, continued holding of Plavix is acceptable though we would recommend continuation of low-dose aspirin and statin therapy throughout the perioperative period.  2. Cervical disc disease with left-sided weakness: Patient has been seen by neurology and neurosurgery. Patient initially described feeling drunk on the day of admission with unsteady gait and swimmy headedness. CT and MRI/MRA did not show any acute intracranial findings, though cervical disc disease was noted. Patient pending surgery as above.  He does have prior history of stroke with an implantable loop recorder. Assuming he will be discharged today, I have asked him to send in a remote transmission for our device clinic to review.  3. Essential hypertension:  Stable. Patient does report some orthostatic symptoms and if not already completed, he should have orthostatic vital signs.  4. Hyperlipidemia: Continue statin therapy.  LDL 62 this admission.  5. Tobacco abuse: Patient has smoked one pack a day since the age 84. He is currently smoking half pack a day. Complete cessation advised.  6. COPD: In the setting of #5. Complete smoking cessation advised. He is not actively wheezing but does have diminished breath sounds.  Continue inhaler  therapy.   Signed, Murray Hodgkins, NP 10/10/2016, 8:57 AM

## 2016-10-10 NOTE — Care Management (Signed)
Requested order for walker per recommendations of physical therapy.  Patient states no agency preference just so long as in network with his medicare humana.  Referral called to [facility]

## 2016-10-10 NOTE — Telephone Encounter (Signed)
TCM... Pt is being discharged later today They saw Dr Rockey Situ in hospital  They need a 2 w fu  Currently scheduled 11/20/16 to see Christell Faith  Please advise

## 2016-10-10 NOTE — Telephone Encounter (Signed)
Lmov for patient to confirm new time and day

## 2016-10-10 NOTE — Progress Notes (Signed)
Pt for discharge home. Alert. No resp distress. Sl dc/d. Instructions discussed with pt.  No  New presc. Home meds discussed.  Diet / activity / and f/u discussed.  Pt provided a walker for his use at home.  Discharged via w/c at this time  To visitor entrance for courtesy car to take him to [place] clinic parking lot to  His car.no voiced c/o.

## 2016-10-10 NOTE — Telephone Encounter (Signed)
What about 10/22/16 at 10:40AM?

## 2016-10-10 NOTE — Discharge Summary (Signed)
[facility] [place] NAME: Louis Gutierrez    MR#:  [id number]  DATE OF BIRTH:  01-16-1951  DATE OF ADMISSION:  10/07/2016 ADMITTING PHYSICIAN: Harrie Foreman, MD  DATE OF DISCHARGE: 10/10/2016   PRIMARY CARE PHYSICIAN: Kirk Ruths, MD    ADMISSION DIAGNOSIS:  Cerebrovascular accident (CVA), unspecified mechanism ([place]) [I63.9]  DISCHARGE DIAGNOSIS:  Active Problems:   Spinal stenosis   SECONDARY DIAGNOSIS:   Past Medical History:  Diagnosis Date  . Asthma without status asthmaticus   . Atherosclerotic cerebrovascular disease    Overview:  Small and facial weakness with minimal weakness   . CAD (coronary artery disease)    a. 05/2010 Inf STEMI/PCI: RCA 142m (3.5x24 Promus DES), LCX small, 100, D1 50ost, D2 40ost, EF 45-50%; b. 03/2013 Cath ([facility]): Report not available - D/C summary says no significant dzs.  . Cervical disc disease    a. s/p C3-4 fusion 2003.  Marland Kitchen Cervical myelopathy ([place])    a. 09/2016 Admit w/ left sided wkns->found to have progressive cervical disc dzs above and below prior fusioin site.  Marland Kitchen COPD (chronic obstructive pulmonary disease) ([place])   . Cryptogenic stroke ([place])    a. 05/2013 s/p acute L MCA territory infarct; b. 07/2013 s/p MDT Linq ILR-->No AFib to documented to date (09/2016).  . Depression   . Essential (primary) hypertension   . H/O insomnia   . Ischemic cardiomyopathy    a. 05/2010 LV gram: EF 45-50% @ time of inf MI; b. 09/2016 Echo: EF 50-55%, inf HK, Gr1 DD, mild MR.  . Mixed hyperlipidemia   . Nephrolithiasis   . Obesity   . OSA (obstructive sleep apnea)   . Osteoarthritis   . STEMI (ST elevation myocardial infarction) ([place]) 06/07/2010  . Tobacco abuse   . Urinary incontinence   . Varicose veins     HOSPITAL COURSE:   1. Cervical spinal cord myelopathy TIA suspected but now less likely as symptoms persists as per neurology :  CT head negative. MRI/MRA brain  Negative Carotid Dopplers with no significant stenosis Echocardiogram. Results reviewed with EF 55%, some hypokinesis of inferior wall.  PTOT no recommendations - follow as out pt; Patient has passed bedside swallow evaluation  LDL 62  Continue aspirin and Plavix    Due to persistent symptoms, neurologist suggested MRI cervical spine. MRI of the C-spine with severe C3-C4 central canal stenosis , neurosurgery consulted  Suggested C3-4 and C6-7 fusion surgery, for that need cardio clearance and anesthesia clearance.  Appreciated help of cardiologist, No further work ups needed.  As his Stent was > 1 year ago, approved holding plavix.  2. Hypertension: Continue antihypertensive medication ramipril  3. CAD: Stable; continue aspirin and Plavix. Imdur per home regimen.     Called cardio consult as need clearance for spinal surgery.  4. OSA: The patient does not consistently wear CPAP at home which contributes to his risk for stroke. He agrees to use CPAP hospitalized.  5. COPD: Continue inhaled corticosteroid  6. DVT prophylaxis: Lovenox 7. GI prophylaxis: None  DISCHARGE CONDITIONS:   Stable.  CONSULTS OBTAINED:  Treatment Team:  Alexis Goodell, MD Minna Merritts, MD  DRUG ALLERGIES:   Allergies  Allergen Reactions  . Penicillins Hives    Has patient had a PCN reaction causing immediate rash, facial/tongue/throat swelling, SOB or lightheadedness with hypotension: No Has patient had a PCN reaction causing severe rash involving mucus membranes or skin necrosis: No Has  patient had a PCN reaction that required hospitalization No Has patient had a PCN reaction occurring within the last 10 years: No If all of the above answers are "NO", then may proceed with Cephalosporin use.     DISCHARGE MEDICATIONS:   Current Discharge Medication List    CONTINUE these medications which have NOT CHANGED   Details  acetaminophen (TYLENOL) 500 MG tablet Take 1,000 mg by mouth every  8 (eight) hours as needed for mild pain.     ADVAIR DISKUS 100-50 MCG/DOSE AEPB Inhale 1 puff into the lungs 2 (two) times daily.    aspirin EC 81 MG tablet Take 81 mg by mouth daily.    COMBIVENT RESPIMAT 20-100 MCG/ACT AERS respimat Inhale 1 puff into the lungs 2 (two) times daily.    isosorbide mononitrate (IMDUR) 30 MG 24 hr tablet TAKE ONE (1) TABLET EACH DAY Qty: 90 tablet, Refills: 2    nitroGLYCERIN (NITROSTAT) 0.4 MG SL tablet Place 0.4 mg under the tongue every 5 (five) minutes as needed for chest pain.     PARoxetine (PAXIL) 40 MG tablet Take 40 mg by mouth.    pravastatin (PRAVACHOL) 40 MG tablet Take 1 tablet (40 mg total) by mouth daily. Qty: 30 tablet, Refills: 9    ramipril (ALTACE) 10 MG capsule TAKE ONE (1) CAPSULE EACH DAY Qty: 30 capsule, Refills: 8    traZODone (DESYREL) 100 MG tablet Take 50-100 mg by mouth at bedtime as needed for sleep.       STOP taking these medications     clopidogrel (PLAVIX) 75 MG tablet          DISCHARGE INSTRUCTIONS:    Follow with cardiologist in 2 weeks. Surgery is scheduled today.  If you experience worsening of your admission symptoms, develop shortness of breath, life threatening emergency, suicidal or homicidal thoughts you must seek medical attention immediately by calling 911 or calling your MD immediately  if symptoms less severe.  You Must read complete instructions/literature along with all the possible adverse reactions/side effects for all the Medicines you take and that have been prescribed to you. Take any new Medicines after you have completely understood and accept all the possible adverse reactions/side effects.   Please note  You were cared for by a hospitalist during your hospital stay. If you have any questions about your discharge medications or the care you received while you were in the hospital after you are discharged, you can call the unit and asked to speak with the hospitalist on call if the  hospitalist that took care of you is not available. Once you are discharged, your primary care physician will handle any further medical issues. Please note that NO REFILLS for any discharge medications will be authorized once you are discharged, as it is imperative that you return to your primary care physician (or establish a relationship with a primary care physician if you do not have one) for your aftercare needs so that they can reassess your need for medications and monitor your lab values.    Today   CHIEF COMPLAINT:   Chief Complaint  Patient presents with  . Dizziness  . Weakness    difficulty ambulating/left side weakness  . Headache    HISTORY OF PRESENT ILLNESS:  Louis Gutierrez  is a 66 y.o. male with a known history of CVA, coronary artery disease status post MI, hypertension, and obstructive sleep apnea presents to the emergency department with left-sided weakness. The patient states that he has  also felt as if he may fall over when trying to walk. He awoke this morning with these symptoms yet went to work anyway. The severity of his symptoms gradually improved but he still felt as if his equilibrium was off. He has had an intermittent headache throughout the day. He denies chest pain, shortness of breath, difficulty swallowing or speaking. He admits that his left leg felt heavy and slightly numb earlier today. He also notes that his left hand feels weak. In the emergency department CT of his head was negative but due to his persistent symptoms emergency department staff called the hospitalist service for admission.   VITAL SIGNS:  Blood pressure 130/69, pulse 84, temperature 98.5 F (36.9 C), temperature source Oral, resp. rate 16, height 5\' 11"  (1.803 m), weight 95.4 kg (210 lb 4 oz), SpO2 98 %.  I/O:    Intake/Output Summary (Last 24 hours) at 10/10/16 1123 Last data filed at 10/10/16 0105  Gross per 24 hour  Intake              720 ml  Output              800 ml  Net               -80 ml    PHYSICAL EXAMINATION:   GENERAL:  66 y.o.-year-old patient lying in the bed with no acute distress.  EYES: Pupils equal, round, reactive to light and accommodation. No scleral icterus. Extraocular muscles intact.  HEENT: Head atraumatic, normocephalic. Oropharynx and nasopharynx clear.  NECK:  Supple, no jugular venous distention. No thyroid enlargement, no tenderness.  LUNGS: Normal breath sounds bilaterally, no wheezing, rales,rhonchi or crepitation. No use of accessory muscles of respiration.  CARDIOVASCULAR: S1, S2 normal. No murmurs, rubs, or gallops.  ABDOMEN: Soft, nontender, nondistended. Bowel sounds present. No organomegaly or mass.  EXTREMITIES: No pedal edema, cyanosis, or clubbing.  NEUROLOGIC: Cranial nerves II through XII are intact. Muscle strength 5/5 in all extremities. Sensation intact. Gait not checked.  PSYCHIATRIC: The patient is alert and oriented x 3.  SKIN: No obvious rash, lesion, or ulcer.    DATA REVIEW:   CBC  Recent Labs Lab 10/07/16 1731  WBC 8.8  HGB 11.7*  HCT 35.7*  PLT 300    Chemistries   Recent Labs Lab 10/07/16 1731  NA 136  K 4.2  CL 102  CO2 26  GLUCOSE 110*  BUN 21*  CREATININE 0.98  CALCIUM 9.4  AST 19  ALT 16*  ALKPHOS 113  BILITOT 0.6    Cardiac Enzymes  Recent Labs Lab 10/07/16 1731  TROPONINI <0.03    Microbiology Results  Results for orders placed or performed in visit on 10/16/15  Microscopic Examination     Status: Abnormal   Collection Time: 10/16/15 11:04 AM  Result Value Ref Range Status   WBC, UA 0-5 0 - 5 /hpf Final   RBC, UA 11-30 (A) 0 - 2 /hpf Final   Epithelial Cells (non renal) None seen 0 - 10 /hpf Final   Crystals Present (A) N/A Final   Crystal Type Amorphous Sediment N/A Final   Bacteria, UA Few None seen/Few Final    RADIOLOGY:  Dg Cervical Spine With Flex & Extend  Result Date: 10/09/2016 CLINICAL DATA:  Left-sided paresthesia and gait instability. Distance  cervical fusion. Spinal stenosis C3-4. EXAM: CERVICAL SPINE COMPLETE WITH FLEXION AND EXTENSION VIEWS COMPARISON:  MRI 10/08/2016 FINDINGS: Previous ACDF from C4 through C6 with solid fusion.  Degenerative spondylosis at C3-4 with disc space narrowing and endplate osteophytes. Flexion extension views do not generated great deal of motion. No subluxation occurs. Bilateral foraminal stenosis because of osteophytic encroachment at the C3-4 level, worse on the right than the left. Uncovertebral degeneration at C6-7 with foraminal encroachment. IMPRESSION: The patient does not generate a great deal of bending motion. No subluxation is seen at the C3-4 level with bending. Bony foraminal stenosis at C3-4 and C6-7. Electronically Signed   By: Nelson Chimes M.D.   On: 10/09/2016 11:06   Ct Cervical Spine Wo Contrast  Result Date: 10/09/2016 CLINICAL DATA:  Left-sided weakness and unsteady gait of 1 day duration. Previous cervical fusion. Adjacent segment degenerative changes. EXAM: CT CERVICAL SPINE WITHOUT CONTRAST TECHNIQUE: Multidetector CT imaging of the cervical spine was performed without intravenous contrast. Multiplanar CT image reconstructions were also generated. COMPARISON:  MRI 10/08/2016.  Radiography same day. FINDINGS: Alignment: Straightening of the normal cervical lordosis. Skull base and vertebrae: No primary bone lesion. See below for details at each level. Soft tissues and spinal canal: No significant soft tissue neck lesion. Atherosclerotic calcification at the carotid bifurcations. Disc levels: Foramen magnum widely patent. C1-2 shows ordinary osteoarthritis but no compressive stenosis. C2-3:  No significant finding. C3-4: Degenerative spondylosis with endplate osteophytes and bulging disc material. Spinal stenosis with AP diameter as narrow as 7 mm. Bilateral neural foraminal stenosis because of osteophytic encroachment. C4 through C6: Previous ACDF is solid. Sufficient patency of the canal and  foramina throughout the segment. C6-7: Degenerative spondylosis with endplate osteophytes and bulging disc material. AP diameter as narrow as 8.5 mm. Bilateral neural foraminal encroachment by osteophytes could affect the C7 nerve roots. C7-T1:  Normal interspace. Upper thoracic region is unremarkable. Upper chest: No active disease. Other: None IMPRESSION: Good appearance in the fusion segment from C4 through C6. Adjacent segment degenerative spondylosis at C3-4 and C6-7. Canal stenosis worse at the C3-4 than at C6-7. Bilateral neural foraminal stenosis at those levels that would have potential to compress the C4 and C7 nerve roots. Electronically Signed   By: Nelson Chimes M.D.   On: 10/09/2016 11:29   Mr Cervical Spine Wo Contrast  Result Date: 10/08/2016 CLINICAL DATA:  Initial evaluation for left-sided numbness and weakness, gait difficulty. History of prior cervical spine fusion. EXAM: MRI CERVICAL SPINE WITHOUT CONTRAST TECHNIQUE: Multiplanar, multisequence MR imaging of the cervical spine was performed. No intravenous contrast was administered. COMPARISON:  None available. FINDINGS: Alignment: Mild straightening of the normal cervical lordosis. Trace 2 mm retrolisthesis of C3 on C4. Vertebrae: Patient is status post ACDF at C4 through C6. There appears to be solid osseous fusion at these levels. Vertebral body heights maintained. No evidence for acute or chronic fracture. No discrete or worrisome osseous lesions. Bone marrow signal intensity normal. No abnormal marrow edema. Cord: Signal intensity within the cervical spinal cord is within normal limits. Posterior Fossa, vertebral arteries, paraspinal tissues: Visualized brain and posterior fossa within normal limits. Craniocervical junction normal. Paraspinous and prevertebral soft tissues normal. Normal intravascular flow voids present within the vertebral arteries bilaterally. Disc levels: C2-C3: Mild diffuse disc bulging, indenting the ventral thecal  sac without significant canal stenosis. Mild bilateral facet degeneration. No significant neural foraminal stenosis. C3-C4: 2 mm retrolisthesis. Chronic diffuse degenerative disc osteophyte with intervertebral disc space narrowing. Broad posterior component flattens and effaces the ventral thecal sac. Secondary cord flattening without cord signal changes. Severe canal stenosis with the thecal sac measuring 6 mm in AP diameter.  Severe bilateral C4 foraminal stenosis present as well. C4-C5: Status post fusion. No residual canal stenosis. Residual uncovertebral hypertrophy with moderate left and mild right foraminal stenosis. C5-C6: Status post fusion. No residual canal stenosis. Residual left-sided uncovertebral disease with resultant mild left foraminal stenosis. No significant right foraminal encroachment C6-C7: Chronic diffuse degenerative disc osteophyte with intervertebral disc space narrowing. Broad posterior component flattens and partially effaces the ventral thecal sac. Mild ligamentum flavum hypertrophy. Moderate spinal stenosis. No significant cord deformity. Severe bilateral C7 foraminal stenosis, left slightly worse than right. C7-T1: Mild facet hypertrophy. No significant disc bulge. No stenosis. Visualized upper thoracic spine within normal limits. IMPRESSION: 1. Status post cervical fusion at C4 through C6 without residual canal stenosis. 2. Adjacent segment disease with degenerative disc osteophyte at C3-4 with resultant severe canal and bilateral C4 foraminal stenosis. 3. Diffuse degenerative disc osteophyte at C6-7 with resultant moderate canal and severe bilateral C7 foraminal stenosis. Electronically Signed   By: Jeannine Boga M.D.   On: 10/08/2016 14:15    EKG:   Orders placed or performed during the hospital encounter of 10/07/16  . ED EKG  . ED EKG      Management plans discussed with the patient, family and they are in agreement.  CODE STATUS:     Code Status Orders         Start     Ordered   10/07/16 2121  Full code  Continuous     10/07/16 2120    Code Status History    Date Active Date Inactive Code Status Order ID Comments User Context   This patient has a current code status but no historical code status.      TOTAL TIME TAKING CARE OF THIS PATIENT: 35 minutes.    Vaughan Basta M.D on 10/10/2016 at 11:23 AM  Between 7am to 6pm - Pager - [phone]  After 6pm go to [email or web address] - password [email or web address] [facility]  Office  [phone]  CC: Primary care physician; Kirk Ruths, MD   Note: This dictation was prepared with Dragon dictation along with smaller phrase technology. Any transcriptional errors that result from this process are unintentional.

## 2016-10-10 NOTE — Progress Notes (Signed)
Physical Therapy Treatment Patient Details Name: Louis Gutierrez MRN: [id number] DOB: 31-Jul-1950 Today's Date: 10/10/2016    History of Present Illness Pt admitted for possible CVA this date, however all imaging returning negative. Pt with complaints of L side weakness. History of CVA and CAD s/p MI along with HTN. MR cervical spine ordered.  Plan for C3-4 and C6-7 ACDF October 3rd.    PT Comments    Pt reporting he has been having increasing L knee pain since hospital admission (pt denies any trauma; nursing aware and gave pt pain meds prior to PT session).  L knee noted to be swollen and decreased L knee ROM d/t pain and swelling noted.  Pt not safe ambulating with SPC d/t significant L knee pain (requiring UE support) but pt able to safely ambulate with RW after initial cueing.  Will continue to focus on strengthening, balance, and progressive functional mobility per pt tolerance.   Follow Up Recommendations  Outpatient PT     Equipment Recommendations  Rolling walker with 5" wheels (CM notified)   Recommendations for Other Services       Precautions / Restrictions Precautions Precautions: Fall Restrictions Weight Bearing Restrictions: No    Mobility  Bed Mobility Overal bed mobility: Modified Independent             General bed mobility comments: Sit to supine with increased effort to move L LE d/t L knee pain.  Transfers Overall transfer level: Needs assistance Equipment used: Rolling walker (2 wheeled) Transfers: Sit to/from Stand Sit to Stand: Min guard;Supervision         General transfer comment: pt with increased effort and requiring B UE support to stand with SPC; CGA to supervision with vc's for UE and LE placement/positioning for transfers with RW  Ambulation/Gait Ambulation/Gait assistance: Min guard Ambulation Distance (Feet): 100 Feet Assistive device: Rolling walker (2 wheeled) Gait Pattern/deviations: Step-to pattern Gait velocity: decreased   General Gait Details: decreased stance time L LE; antalgic.  Pt initially attempted to ambulate with SPC but requiring B UE support d/t L knee pain (x3 feet) so switched to RW.  Pt requiring initial vc's for gait technique and walker use but decreased cueing requiring with distance/repeition and was steady and safe using RW (also decreased discomfort in L knee)   Stairs            Wheelchair Mobility    Modified Rankin (Stroke Patients Only)       Balance Overall balance assessment: Needs assistance Sitting-balance support: No upper extremity supported;Feet supported Sitting balance-Leahy Scale: Good Sitting balance - Comments: sitting reaching within BOS   Standing balance support: Single extremity supported Standing balance-Leahy Scale: Poor Standing balance comment: requiring single UE support for standing reaching within BOS d/t L knee pain                            Cognition Arousal/Alertness: Awake/alert Behavior During Therapy: WFL for tasks assessed/performed Overall Cognitive Status: Within Functional Limits for tasks assessed                                        Exercises      General Comments General comments (skin integrity, edema, etc.): L knee noted to be swollen (nursing aware of pt's L knee pain and pt recently given pain meds).  Nursing cleared pt for  participation in physical therapy.  Pt agreeable to PT session.      Pertinent Vitals/Pain Pain Assessment: 0-10 Pain Score: 9  Pain Location: L knee Pain Descriptors / Indicators: Sore;Tender;Guarding;Grimacing Pain Intervention(s): Limited activity within patient's tolerance;Monitored during session;Premedicated before session;Repositioned  Vitals (HR and O2 on room air) stable and WFL throughout treatment session.    Home Living                      Prior Function            PT Goals (current goals can now be found in the care plan section) Acute Rehab  PT Goals Patient Stated Goal: go home PT Goal Formulation: With patient Time For Goal Achievement: 10/22/16 Potential to Achieve Goals: Good Progress towards PT goals: Progressing toward goals    Frequency    Min 2X/week      PT Plan Current plan remains appropriate    Co-evaluation              AM-PAC PT "6 Clicks" Daily Activity  Outcome Measure  Difficulty turning over in bed (including adjusting bedclothes, sheets and blankets)?: A Little Difficulty moving from lying on back to sitting on the side of the bed? : A Little Difficulty sitting down on and standing up from a chair with arms (e.g., wheelchair, bedside commode, etc,.)?: A Little Help needed moving to and from a bed to chair (including a wheelchair)?: A Little Help needed walking in hospital room?: A Little Help needed climbing 3-5 steps with a railing? : A Lot 6 Click Score: 17    End of Session Equipment Utilized During Treatment: Gait belt Activity Tolerance: Patient limited by pain Patient left: in bed;with call bell/phone within reach Nurse Communication: Mobility status (Nursing aware of pt's pain status) PT Visit Diagnosis: Unsteadiness on feet (R26.81);Muscle weakness (generalized) (M62.81)     Time: 5974-1638 PT Time Calculation (min) (ACUTE ONLY): 23 min  Charges:  $Gait Training: 8-22 mins $Therapeutic Activity: 8-22 mins                    G CodesLeitha Bleak, PT 10/10/16, 11:24 AM [phone]

## 2016-10-13 ENCOUNTER — Ambulatory Visit: Payer: Commercial Managed Care - HMO | Admitting: Urology

## 2016-10-13 ENCOUNTER — Encounter: Payer: Self-pay | Admitting: Urology

## 2016-10-14 ENCOUNTER — Other Ambulatory Visit: Payer: Self-pay | Admitting: Internal Medicine

## 2016-10-14 ENCOUNTER — Ambulatory Visit
Admission: RE | Admit: 2016-10-14 | Discharge: 2016-10-14 | Disposition: A | Discharge status: Home / Self Care | Payer: Medicare HMO | Source: Ambulatory Visit | Attending: Internal Medicine | Admitting: Internal Medicine

## 2016-10-14 DIAGNOSIS — R600 Localized edema: Secondary | ICD-10-CM

## 2016-10-14 DIAGNOSIS — M7122 Synovial cyst of popliteal space [Baker], left knee: Secondary | ICD-10-CM | POA: Insufficient documentation

## 2016-10-14 DIAGNOSIS — J449 Chronic obstructive pulmonary disease, unspecified: Secondary | ICD-10-CM | POA: Diagnosis not present

## 2016-10-14 DIAGNOSIS — I25119 Atherosclerotic heart disease of native coronary artery with unspecified angina pectoris: Secondary | ICD-10-CM | POA: Insufficient documentation

## 2016-10-14 DIAGNOSIS — M7989 Other specified soft tissue disorders: Secondary | ICD-10-CM | POA: Diagnosis not present

## 2016-10-14 DIAGNOSIS — M79605 Pain in left leg: Secondary | ICD-10-CM | POA: Diagnosis not present

## 2016-10-17 ENCOUNTER — Encounter
Admission: RE | Admit: 2016-10-17 | Discharge: 2016-10-17 | Disposition: A | Discharge status: Home / Self Care | Payer: Medicare HMO | Source: Ambulatory Visit | Attending: Neurosurgery | Admitting: Neurosurgery

## 2016-10-17 DIAGNOSIS — Z79899 Other long term (current) drug therapy: Secondary | ICD-10-CM | POA: Insufficient documentation

## 2016-10-17 DIAGNOSIS — Z7982 Long term (current) use of aspirin: Secondary | ICD-10-CM | POA: Diagnosis not present

## 2016-10-17 DIAGNOSIS — M5001 Cervical disc disorder with myelopathy,  high cervical region: Secondary | ICD-10-CM | POA: Diagnosis not present

## 2016-10-17 DIAGNOSIS — I10 Essential (primary) hypertension: Secondary | ICD-10-CM | POA: Insufficient documentation

## 2016-10-17 DIAGNOSIS — J449 Chronic obstructive pulmonary disease, unspecified: Secondary | ICD-10-CM | POA: Diagnosis not present

## 2016-10-17 DIAGNOSIS — Z01812 Encounter for preprocedural laboratory examination: Secondary | ICD-10-CM | POA: Diagnosis not present

## 2016-10-17 DIAGNOSIS — F17210 Nicotine dependence, cigarettes, uncomplicated: Secondary | ICD-10-CM | POA: Insufficient documentation

## 2016-10-17 DIAGNOSIS — G4733 Obstructive sleep apnea (adult) (pediatric): Secondary | ICD-10-CM | POA: Insufficient documentation

## 2016-10-17 HISTORY — DX: Unilateral inguinal hernia, without obstruction or gangrene, not specified as recurrent: K40.90

## 2016-10-17 LAB — TYPE AND SCREEN
ABO/RH(D): O POS
Antibody Screen: NEGATIVE

## 2016-10-17 LAB — SURGICAL PCR SCREEN
MRSA, PCR: NEGATIVE
STAPHYLOCOCCUS AUREUS: NEGATIVE

## 2016-10-17 NOTE — Patient Instructions (Signed)
Your procedure is scheduled on: Wednesday, October 22, 2016 Report to Same Day Surgery on the 2nd floor in the [facility]. To find out your arrival time, please call [phone] between 1PM - 3PM on: Tuesday, October 21, 2016  REMEMBER: Instructions that are not followed completely may result in serious medical risk up to and including death; or upon the discretion of your surgeon and anesthesiologist your surgery may need to be rescheduled.  Do not eat food or drink liquids after midnight. No gum chewing or hard candies.  You may however, drink CLEAR liquids up to 2 hours before you are scheduled to arrive at the hospital for your procedure.  Do not drink clear liquids within 2 hours of your scheduled arrival to the hospital as this may lead to your procedure being delayed or rescheduled.  Clear liquids include: - water  - apple juice without pulp - clear gatorade - black coffee or tea (NO milk, creamers, sugars) DO NOT drink anything not on this list.  Type 1 and Type 2 diabetics should only drink water.  No Alcohol for 24 hours before or after surgery.  No Smoking for 24 hours prior to surgery.  Notify your doctor if there is any change in your medical condition (cold, fever, infection).  Do not wear jewelry, make-up, hairpins, clips or nail polish.  Do not wear lotions, powders, or perfumes.   Do not shave 48 hours prior to surgery. Men may shave face and neck.  Contacts and dentures may not be worn into surgery.  Do not bring valuables to the hospital. [facility] is not responsible for any belongings or valuables.   TAKE THESE MEDICATIONS THE MORNING OF SURGERY WITH A SIP OF WATER:  1.  ADVAIR 2.  COMBIVENT 3.  ISOSORBIDE (IMDUR) 4.  PAXIL  Use CHG Soap as directed on instruction sheet.  Use inhalers on the day of surgery and bring to the hospital.  Follow recommendations from Cardiologist, Pulmonologist or PCP regarding stopping Aspirin AND Plavix.  Stop  Anti-inflammatories such as Advil, Aleve, Ibuprofen, Motrin, Naproxen, Naprosyn, Goodie powder, or aspirin products. (May take Tylenol or Acetaminophen if needed.)  Stop OVER THE COUNTER supplements until after surgery.  If you are being admitted to the hospital overnight, leave your suitcase in the car. After surgery it may be brought to your room.  If you are being discharged the day of surgery, you will not be allowed to drive home. You will need someone to drive you home and stay with you that night.   If you are taking public transportation, you will need to have a responsible adult to with you.  Please call the number above if you have any questions about these instructions.

## 2016-10-22 ENCOUNTER — Encounter
Admission: RE | Disposition: A | Discharge status: Home / Self Care | Payer: Self-pay | Source: Ambulatory Visit | Attending: Neurosurgery

## 2016-10-22 ENCOUNTER — Ambulatory Visit: Payer: Medicare HMO

## 2016-10-22 ENCOUNTER — Ambulatory Visit: Payer: Medicare HMO | Admitting: Cardiovascular Disease

## 2016-10-22 ENCOUNTER — Ambulatory Visit: Payer: Medicare HMO | Admitting: Anesthesiology

## 2016-10-22 ENCOUNTER — Observation Stay
Admission: RE | Admit: 2016-10-22 | Discharge: 2016-10-23 | Disposition: A | Discharge status: Home / Self Care | Payer: Medicare HMO | Source: Ambulatory Visit | Attending: Neurosurgery | Admitting: Neurosurgery

## 2016-10-22 DIAGNOSIS — Z79899 Other long term (current) drug therapy: Secondary | ICD-10-CM | POA: Insufficient documentation

## 2016-10-22 DIAGNOSIS — E669 Obesity, unspecified: Secondary | ICD-10-CM | POA: Insufficient documentation

## 2016-10-22 DIAGNOSIS — I739 Peripheral vascular disease, unspecified: Secondary | ICD-10-CM | POA: Diagnosis not present

## 2016-10-22 DIAGNOSIS — I10 Essential (primary) hypertension: Secondary | ICD-10-CM | POA: Diagnosis not present

## 2016-10-22 DIAGNOSIS — G959 Disease of spinal cord, unspecified: Secondary | ICD-10-CM | POA: Diagnosis present

## 2016-10-22 DIAGNOSIS — Z6831 Body mass index (BMI) 31.0-31.9, adult: Secondary | ICD-10-CM | POA: Diagnosis not present

## 2016-10-22 DIAGNOSIS — G4700 Insomnia, unspecified: Secondary | ICD-10-CM | POA: Diagnosis not present

## 2016-10-22 DIAGNOSIS — M4322 Fusion of spine, cervical region: Secondary | ICD-10-CM | POA: Diagnosis not present

## 2016-10-22 DIAGNOSIS — I252 Old myocardial infarction: Secondary | ICD-10-CM | POA: Diagnosis not present

## 2016-10-22 DIAGNOSIS — E782 Mixed hyperlipidemia: Secondary | ICD-10-CM | POA: Diagnosis not present

## 2016-10-22 DIAGNOSIS — J449 Chronic obstructive pulmonary disease, unspecified: Secondary | ICD-10-CM | POA: Diagnosis not present

## 2016-10-22 DIAGNOSIS — Z7901 Long term (current) use of anticoagulants: Secondary | ICD-10-CM | POA: Insufficient documentation

## 2016-10-22 DIAGNOSIS — M1990 Unspecified osteoarthritis, unspecified site: Secondary | ICD-10-CM | POA: Diagnosis not present

## 2016-10-22 DIAGNOSIS — Z7982 Long term (current) use of aspirin: Secondary | ICD-10-CM | POA: Insufficient documentation

## 2016-10-22 DIAGNOSIS — F17210 Nicotine dependence, cigarettes, uncomplicated: Secondary | ICD-10-CM | POA: Insufficient documentation

## 2016-10-22 DIAGNOSIS — M4802 Spinal stenosis, cervical region: Secondary | ICD-10-CM | POA: Insufficient documentation

## 2016-10-22 DIAGNOSIS — Z8673 Personal history of transient ischemic attack (TIA), and cerebral infarction without residual deficits: Secondary | ICD-10-CM | POA: Diagnosis not present

## 2016-10-22 DIAGNOSIS — Z7902 Long term (current) use of antithrombotics/antiplatelets: Secondary | ICD-10-CM | POA: Insufficient documentation

## 2016-10-22 DIAGNOSIS — Z9181 History of falling: Secondary | ICD-10-CM | POA: Diagnosis not present

## 2016-10-22 DIAGNOSIS — G4733 Obstructive sleep apnea (adult) (pediatric): Secondary | ICD-10-CM | POA: Insufficient documentation

## 2016-10-22 DIAGNOSIS — F329 Major depressive disorder, single episode, unspecified: Secondary | ICD-10-CM | POA: Insufficient documentation

## 2016-10-22 DIAGNOSIS — M50021 Cervical disc disorder at C4-C5 level with myelopathy: Secondary | ICD-10-CM | POA: Diagnosis not present

## 2016-10-22 DIAGNOSIS — M4712 Other spondylosis with myelopathy, cervical region: Principal | ICD-10-CM | POA: Insufficient documentation

## 2016-10-22 DIAGNOSIS — J45909 Unspecified asthma, uncomplicated: Secondary | ICD-10-CM | POA: Insufficient documentation

## 2016-10-22 DIAGNOSIS — I2510 Atherosclerotic heart disease of native coronary artery without angina pectoris: Secondary | ICD-10-CM | POA: Diagnosis not present

## 2016-10-22 DIAGNOSIS — M50023 Cervical disc disorder at C6-C7 level with myelopathy: Secondary | ICD-10-CM | POA: Diagnosis not present

## 2016-10-22 DIAGNOSIS — Z981 Arthrodesis status: Secondary | ICD-10-CM | POA: Insufficient documentation

## 2016-10-22 DIAGNOSIS — Z419 Encounter for procedure for purposes other than remedying health state, unspecified: Secondary | ICD-10-CM

## 2016-10-22 HISTORY — PX: ANTERIOR CERVICAL DECOMP/DISCECTOMY FUSION: SHX1161

## 2016-10-22 LAB — ABO/RH: ABO/RH(D): O POS

## 2016-10-22 SURGERY — ANTERIOR CERVICAL DECOMPRESSION/DISCECTOMY FUSION 2 LEVELS
Anesthesia: General | Site: Spine Cervical | Wound class: Clean

## 2016-10-22 MED ORDER — VANCOMYCIN HCL IN DEXTROSE 1-5 GM/200ML-% IV SOLN
1000.0000 mg | Freq: Once | INTRAVENOUS | Status: AC
Start: 2016-10-22 — End: 2016-10-22
  Administered 2016-10-22: 1000 mg via INTRAVENOUS

## 2016-10-22 MED ORDER — DEXAMETHASONE SODIUM PHOSPHATE 4 MG/ML IJ SOLN
INTRAMUSCULAR | Status: DC | PRN
  Administered 2016-10-22: 10 mg via INTRAVENOUS

## 2016-10-22 MED ORDER — LABETALOL HCL 5 MG/ML IV SOLN
INTRAVENOUS | Status: AC
  Administered 2016-10-22: 10 mg via INTRAVENOUS
  Filled 2016-10-22: qty 4

## 2016-10-22 MED ORDER — BUPIVACAINE LIPOSOME 1.3 % IJ SUSP
INTRAMUSCULAR | Status: AC
  Filled 2016-10-22: qty 20

## 2016-10-22 MED ORDER — IPRATROPIUM-ALBUTEROL 0.5-2.5 (3) MG/3ML IN SOLN
3.0000 mL | Freq: Two times a day (BID) | RESPIRATORY_TRACT | Status: DC
  Administered 2016-10-22 – 2016-10-23 (×2): 3 mL via RESPIRATORY_TRACT
  Filled 2016-10-22 (×2): qty 3

## 2016-10-22 MED ORDER — SUCCINYLCHOLINE CHLORIDE 20 MG/ML IJ SOLN
INTRAMUSCULAR | Status: DC | PRN
  Administered 2016-10-22: 100 mg via INTRAVENOUS

## 2016-10-22 MED ORDER — PRAVASTATIN SODIUM 20 MG PO TABS
40.0000 mg | ORAL_TABLET | Freq: Every day | ORAL | Status: DC
  Administered 2016-10-22: 40 mg via ORAL
  Filled 2016-10-22: qty 2

## 2016-10-22 MED ORDER — HYDRALAZINE HCL 20 MG/ML IJ SOLN
INTRAMUSCULAR | Status: AC
  Filled 2016-10-22: qty 1

## 2016-10-22 MED ORDER — IPRATROPIUM-ALBUTEROL 20-100 MCG/ACT IN AERS
1.0000 | INHALATION_SPRAY | RESPIRATORY_TRACT | Status: DC
Start: 2016-10-22 — End: 2016-10-22

## 2016-10-22 MED ORDER — ACETAMINOPHEN 650 MG RE SUPP
650.0000 mg | RECTAL | Status: DC | PRN
Start: 2016-10-22 — End: 2016-10-23

## 2016-10-22 MED ORDER — METHOCARBAMOL 500 MG PO TABS
500.0000 mg | ORAL_TABLET | Freq: Four times a day (QID) | ORAL | Status: DC | PRN
Start: 2016-10-22 — End: 2016-10-23
  Administered 2016-10-22: 500 mg via ORAL
  Filled 2016-10-22: qty 1

## 2016-10-22 MED ORDER — REMIFENTANIL HCL 1 MG IV SOLR
INTRAVENOUS | Status: DC | PRN
  Administered 2016-10-22: .1 ug/kg/min via INTRAVENOUS

## 2016-10-22 MED ORDER — OXYCODONE HCL 5 MG PO TABS: 5.0000 mg | ORAL_TABLET | ORAL | Status: DC | PRN

## 2016-10-22 MED ORDER — MENTHOL 3 MG MT LOZG
1.0000 | LOZENGE | OROMUCOSAL | Status: DC | PRN
  Filled 2016-10-22: qty 9

## 2016-10-22 MED ORDER — PROPOFOL 10 MG/ML IV BOLUS
INTRAVENOUS | Status: DC | PRN
  Administered 2016-10-22: 18 mg via INTRAVENOUS
  Administered 2016-10-22: 50 mg via INTRAVENOUS
  Administered 2016-10-22: 150 mg via INTRAVENOUS

## 2016-10-22 MED ORDER — PROPOFOL 10 MG/ML IV BOLUS
INTRAVENOUS | Status: AC
  Filled 2016-10-22: qty 20

## 2016-10-22 MED ORDER — ISOSORBIDE MONONITRATE ER 30 MG PO TB24
30.0000 mg | ORAL_TABLET | Freq: Every day | ORAL | Status: DC
  Administered 2016-10-23: 30 mg via ORAL
  Filled 2016-10-22: qty 1

## 2016-10-22 MED ORDER — PROPOFOL 500 MG/50ML IV EMUL
INTRAVENOUS | Status: AC
  Filled 2016-10-22: qty 50

## 2016-10-22 MED ORDER — SODIUM CHLORIDE 0.9% FLUSH
3.0000 mL | Freq: Two times a day (BID) | INTRAVENOUS | Status: DC
  Administered 2016-10-22: 3 mL via INTRAVENOUS

## 2016-10-22 MED ORDER — PROPOFOL 500 MG/50ML IV EMUL
INTRAVENOUS | Status: DC | PRN
  Administered 2016-10-22: 150 ug/kg/min via INTRAVENOUS

## 2016-10-22 MED ORDER — FENTANYL CITRATE (PF) 100 MCG/2ML IJ SOLN
25.0000 ug | INTRAMUSCULAR | Status: DC | PRN
  Administered 2016-10-22 (×2): 25 ug via INTRAVENOUS

## 2016-10-22 MED ORDER — OXYBUTYNIN CHLORIDE 5 MG PO TABS
ORAL_TABLET | ORAL | Status: AC
  Filled 2016-10-22: qty 1

## 2016-10-22 MED ORDER — SODIUM CHLORIDE 0.9 % IJ SOLN
INTRAMUSCULAR | Status: AC
  Filled 2016-10-22: qty 50

## 2016-10-22 MED ORDER — GELATIN ABSORBABLE 12-7 MM EX MISC
CUTANEOUS | Status: DC | PRN
  Administered 2016-10-22: 1

## 2016-10-22 MED ORDER — SODIUM CHLORIDE 0.9% FLUSH: 3.0000 mL | INTRAVENOUS | Status: DC | PRN

## 2016-10-22 MED ORDER — SODIUM CHLORIDE FLUSH 0.9 % IV SOLN
INTRAVENOUS | Status: AC
  Filled 2016-10-22: qty 10

## 2016-10-22 MED ORDER — ONDANSETRON HCL 4 MG/2ML IJ SOLN: 4.0000 mg | Freq: Four times a day (QID) | INTRAMUSCULAR | Status: DC | PRN

## 2016-10-22 MED ORDER — LIDOCAINE HCL (PF) 2 % IJ SOLN
INTRAMUSCULAR | Status: AC
  Filled 2016-10-22: qty 4

## 2016-10-22 MED ORDER — VANCOMYCIN HCL IN DEXTROSE 1-5 GM/200ML-% IV SOLN
INTRAVENOUS | Status: AC
Start: 2016-10-22 — End: 2016-10-22
  Administered 2016-10-22: 1000 mg via INTRAVENOUS
  Filled 2016-10-22: qty 200

## 2016-10-22 MED ORDER — PHENYLEPHRINE HCL 10 MG/ML IJ SOLN
INTRAVENOUS | Status: DC | PRN
  Administered 2016-10-22: 40 ug/min via INTRAVENOUS

## 2016-10-22 MED ORDER — GELATIN ABSORBABLE 12-7 MM EX MISC
CUTANEOUS | Status: AC
  Filled 2016-10-22: qty 1

## 2016-10-22 MED ORDER — METHYLPREDNISOLONE ACETATE 40 MG/ML IJ SUSP
INTRAMUSCULAR | Status: AC
  Filled 2016-10-22: qty 1

## 2016-10-22 MED ORDER — NITROGLYCERIN 0.4 MG SL SUBL: 0.4000 mg | SUBLINGUAL_TABLET | SUBLINGUAL | Status: DC | PRN

## 2016-10-22 MED ORDER — PHENYLEPHRINE HCL 10 MG/ML IJ SOLN
INTRAMUSCULAR | Status: AC
  Filled 2016-10-22: qty 1

## 2016-10-22 MED ORDER — ONDANSETRON HCL 4 MG/2ML IJ SOLN
INTRAMUSCULAR | Status: DC | PRN
  Administered 2016-10-22: 4 mg via INTRAVENOUS

## 2016-10-22 MED ORDER — TRAZODONE HCL 50 MG PO TABS: 50.0000 mg | ORAL_TABLET | Freq: Every evening | ORAL | Status: DC | PRN

## 2016-10-22 MED ORDER — DEXTROSE 5 % IV SOLN
500.0000 mg | Freq: Four times a day (QID) | INTRAVENOUS | Status: DC | PRN
  Filled 2016-10-22: qty 5

## 2016-10-22 MED ORDER — FAMOTIDINE 20 MG PO TABS
20.0000 mg | ORAL_TABLET | Freq: Once | ORAL | Status: AC
  Administered 2016-10-22: 20 mg via ORAL

## 2016-10-22 MED ORDER — MIDAZOLAM HCL 2 MG/2ML IJ SOLN
INTRAMUSCULAR | Status: AC
  Filled 2016-10-22: qty 2

## 2016-10-22 MED ORDER — SODIUM CHLORIDE 0.9 % IV SOLN
INTRAVENOUS | Status: DC
  Administered 2016-10-22 (×2): via INTRAVENOUS

## 2016-10-22 MED ORDER — THROMBIN 5000 UNITS EX SOLR
CUTANEOUS | Status: DC | PRN
  Administered 2016-10-22: 5000 [IU] via TOPICAL

## 2016-10-22 MED ORDER — BUPIVACAINE-EPINEPHRINE (PF) 0.5% -1:200000 IJ SOLN
INTRAMUSCULAR | Status: DC | PRN
  Administered 2016-10-22: 10 mL

## 2016-10-22 MED ORDER — HYDROMORPHONE HCL 1 MG/ML IJ SOLN
INTRAMUSCULAR | Status: AC
  Administered 2016-10-22: 0.25 mg via INTRAVENOUS
  Filled 2016-10-22: qty 1

## 2016-10-22 MED ORDER — FENTANYL CITRATE (PF) 100 MCG/2ML IJ SOLN
INTRAMUSCULAR | Status: AC
  Filled 2016-10-22: qty 2

## 2016-10-22 MED ORDER — HYDROMORPHONE HCL 1 MG/ML IJ SOLN
0.2500 mg | INTRAMUSCULAR | Status: DC | PRN
  Administered 2016-10-22: 0.25 mg via INTRAVENOUS

## 2016-10-22 MED ORDER — ONDANSETRON HCL 4 MG/2ML IJ SOLN: 4.0000 mg | Freq: Once | INTRAMUSCULAR | Status: DC | PRN

## 2016-10-22 MED ORDER — REMIFENTANIL HCL 1 MG IV SOLR
INTRAVENOUS | Status: AC
  Filled 2016-10-22: qty 1000

## 2016-10-22 MED ORDER — MIDAZOLAM HCL 2 MG/2ML IJ SOLN
INTRAMUSCULAR | Status: DC | PRN
  Administered 2016-10-22: 1 mg via INTRAVENOUS

## 2016-10-22 MED ORDER — KETAMINE HCL 50 MG/ML IJ SOLN
INTRAMUSCULAR | Status: AC
  Filled 2016-10-22: qty 10

## 2016-10-22 MED ORDER — PHENOL 1.4 % MT LIQD
1.0000 | OROMUCOSAL | Status: DC | PRN
  Filled 2016-10-22: qty 177

## 2016-10-22 MED ORDER — BUPIVACAINE-EPINEPHRINE (PF) 0.5% -1:200000 IJ SOLN
INTRAMUSCULAR | Status: AC
  Filled 2016-10-22: qty 30

## 2016-10-22 MED ORDER — BUPIVACAINE HCL (PF) 0.5 % IJ SOLN
INTRAMUSCULAR | Status: AC
  Filled 2016-10-22: qty 30

## 2016-10-22 MED ORDER — FAMOTIDINE 20 MG PO TABS
ORAL_TABLET | ORAL | Status: AC
  Administered 2016-10-22: 20 mg via ORAL
  Filled 2016-10-22: qty 1

## 2016-10-22 MED ORDER — MOMETASONE FURO-FORMOTEROL FUM 100-5 MCG/ACT IN AERO
2.0000 | INHALATION_SPRAY | Freq: Two times a day (BID) | RESPIRATORY_TRACT | Status: DC
  Administered 2016-10-22 – 2016-10-23 (×2): 2 via RESPIRATORY_TRACT
  Filled 2016-10-22: qty 8.8

## 2016-10-22 MED ORDER — ACETAMINOPHEN 500 MG PO TABS
1000.0000 mg | ORAL_TABLET | Freq: Four times a day (QID) | ORAL | Status: DC
  Administered 2016-10-22 – 2016-10-23 (×3): 1000 mg via ORAL
  Filled 2016-10-22 (×3): qty 2

## 2016-10-22 MED ORDER — LIDOCAINE HCL (CARDIAC) 20 MG/ML IV SOLN
INTRAVENOUS | Status: DC | PRN
  Administered 2016-10-22: 80 mg via INTRAVENOUS

## 2016-10-22 MED ORDER — BACITRACIN 50000 UNITS IM SOLR
INTRAMUSCULAR | Status: DC | PRN
  Administered 2016-10-22: 1000 mL

## 2016-10-22 MED ORDER — THROMBIN 5000 UNITS EX SOLR
CUTANEOUS | Status: AC
  Filled 2016-10-22: qty 5000

## 2016-10-22 MED ORDER — SENNOSIDES-DOCUSATE SODIUM 8.6-50 MG PO TABS: 1.0000 | ORAL_TABLET | Freq: Every evening | ORAL | Status: DC | PRN

## 2016-10-22 MED ORDER — KETAMINE HCL 50 MG/ML IJ SOLN
INTRAMUSCULAR | Status: DC | PRN
  Administered 2016-10-22: 50 mg via INTRAVENOUS

## 2016-10-22 MED ORDER — SUCCINYLCHOLINE CHLORIDE 20 MG/ML IJ SOLN
INTRAMUSCULAR | Status: AC
  Filled 2016-10-22: qty 1

## 2016-10-22 MED ORDER — RAMIPRIL 10 MG PO CAPS
10.0000 mg | ORAL_CAPSULE | Freq: Every day | ORAL | Status: DC
  Administered 2016-10-22 – 2016-10-23 (×2): 10 mg via ORAL
  Filled 2016-10-22 (×2): qty 1

## 2016-10-22 MED ORDER — GLYCOPYRROLATE 0.2 MG/ML IJ SOLN
INTRAMUSCULAR | Status: AC
Start: 2016-10-22 — End: 2016-10-22
  Filled 2016-10-22: qty 1

## 2016-10-22 MED ORDER — ACETAMINOPHEN 325 MG PO TABS: 650.0000 mg | ORAL_TABLET | ORAL | Status: DC | PRN

## 2016-10-22 MED ORDER — LACTATED RINGERS IV SOLN
INTRAVENOUS | Status: DC
  Administered 2016-10-22 (×3): via INTRAVENOUS

## 2016-10-22 MED ORDER — GLYCOPYRROLATE 0.2 MG/ML IJ SOLN
INTRAMUSCULAR | Status: DC | PRN
  Administered 2016-10-22: 0.2 mg via INTRAVENOUS

## 2016-10-22 MED ORDER — ALBUTEROL SULFATE (2.5 MG/3ML) 0.083% IN NEBU: 2.5000 mg | INHALATION_SOLUTION | Freq: Four times a day (QID) | RESPIRATORY_TRACT | Status: DC | PRN

## 2016-10-22 MED ORDER — SODIUM CHLORIDE 0.9 % IV SOLN: 250.0000 mL | INTRAVENOUS | Status: DC

## 2016-10-22 MED ORDER — PHENYLEPHRINE HCL 10 MG/ML IJ SOLN
INTRAMUSCULAR | Status: DC | PRN
  Administered 2016-10-22: 200 ug via INTRAVENOUS
  Administered 2016-10-22: 150 ug via INTRAVENOUS
  Administered 2016-10-22: 200 ug via INTRAVENOUS
  Administered 2016-10-22: 100 ug via INTRAVENOUS
  Administered 2016-10-22 (×2): 150 ug via INTRAVENOUS

## 2016-10-22 MED ORDER — ONDANSETRON HCL 4 MG PO TABS: 4.0000 mg | ORAL_TABLET | Freq: Four times a day (QID) | ORAL | Status: DC | PRN

## 2016-10-22 MED ORDER — HYDRALAZINE HCL 20 MG/ML IJ SOLN
10.0000 mg | Freq: Once | INTRAMUSCULAR | Status: AC
  Administered 2016-10-22: 10 mg via INTRAVENOUS

## 2016-10-22 MED ORDER — HYDROMORPHONE HCL 1 MG/ML IJ SOLN: 0.5000 mg | INTRAMUSCULAR | Status: DC | PRN

## 2016-10-22 MED ORDER — LABETALOL HCL 5 MG/ML IV SOLN
10.0000 mg | Freq: Once | INTRAVENOUS | Status: AC
  Administered 2016-10-22: 10 mg via INTRAVENOUS

## 2016-10-22 MED ORDER — PAROXETINE HCL 20 MG PO TABS
40.0000 mg | ORAL_TABLET | Freq: Every day | ORAL | Status: DC
  Administered 2016-10-23: 40 mg via ORAL
  Filled 2016-10-22: qty 2

## 2016-10-22 SURGICAL SUPPLY — 55 items
BLADE BOVIE TIP EXT 4 (BLADE) ×2 IMPLANT
BLADE SURG 15 STRL LF DISP TIS (BLADE) ×1 IMPLANT
BLADE SURG 15 STRL SS (BLADE) ×1
BONE MATRIX OSTEOCEL PRO SM (Bone Implant) ×2 IMPLANT
BUR NEURO DRILL SOFT 3.0X3.8M (BURR) ×2 IMPLANT
CAGE COROENT SM 7X17X14-7SPINE (Cage) ×4 IMPLANT
CANISTER SUCT 1200ML W/VALVE (MISCELLANEOUS) ×4 IMPLANT
CHLORAPREP W/TINT 26ML (MISCELLANEOUS) ×2 IMPLANT
COUNTER NEEDLE 20/40 LG (NEEDLE) ×2 IMPLANT
COVER LIGHT HANDLE STERIS (MISCELLANEOUS) ×4 IMPLANT
CRADLE LAMINECT ARM (MISCELLANEOUS) ×2 IMPLANT
CUP MEDICINE 2OZ PLAST GRAD ST (MISCELLANEOUS) ×4 IMPLANT
DERMABOND ADVANCED (GAUZE/BANDAGES/DRESSINGS) ×1
DERMABOND ADVANCED .7 DNX12 (GAUZE/BANDAGES/DRESSINGS) ×1 IMPLANT
DRAPE C-ARM 42X72 X-RAY (DRAPES) ×4 IMPLANT
DRAPE INCISE IOBAN 66X45 STRL (DRAPES) ×2 IMPLANT
DRAPE LAPAROTOMY 77X122 PED (DRAPES) ×2 IMPLANT
DRAPE MICROSCOPE SPINE 48X150 (DRAPES) ×2 IMPLANT
DRAPE POUCH INSTRU U-SHP 10X18 (DRAPES) ×2 IMPLANT
DRAPE SHEET LG 3/4 BI-LAMINATE (DRAPES) ×2 IMPLANT
DRAPE SURG 17X11 SM STRL (DRAPES) ×8 IMPLANT
DRAPE TABLE BACK 80X90 (DRAPES) ×2 IMPLANT
ELECT CAUTERY BLADE TIP 2.5 (TIP) ×2
ELECTRODE CAUTERY BLDE TIP 2.5 (TIP) ×1 IMPLANT
FEE INTRAOP MONITOR IMPULS NCS (MISCELLANEOUS) ×1 IMPLANT
FRAME EYE SHIELD (PROTECTIVE WEAR) ×2 IMPLANT
GLOVE SURG SYN 8.5  E (GLOVE) ×3
GLOVE SURG SYN 8.5 E (GLOVE) ×3 IMPLANT
GOWN SRG XL LVL 3 NONREINFORCE (GOWNS) ×1 IMPLANT
GOWN STRL NON-REIN TWL XL LVL3 (GOWNS) ×1
GOWN STRL REUS W/ TWL LRG LVL3 (GOWN DISPOSABLE) ×1 IMPLANT
GOWN STRL REUS W/TWL LRG LVL3 (GOWN DISPOSABLE) ×1
GRADUATE 1200CC STRL 31836 (MISCELLANEOUS) ×2 IMPLANT
INTRAOP MONITOR FEE IMPULS NCS (MISCELLANEOUS) ×1
INTRAOP MONITOR FEE IMPULSE (MISCELLANEOUS) ×1
IV CATH ANGIO 12GX3 LT BLUE (NEEDLE) ×2 IMPLANT
KIT RM TURNOVER STRD PROC AR (KITS) ×2 IMPLANT
MARKER SKIN DUAL TIP RULER LAB (MISCELLANEOUS) ×4 IMPLANT
NEEDLE HYPO 22GX1.5 SAFETY (NEEDLE) ×2 IMPLANT
NS IRRIG 1000ML POUR BTL (IV SOLUTION) ×2 IMPLANT
PACK LAMINECTOMY NEURO (CUSTOM PROCEDURE TRAY) ×2 IMPLANT
PIN CASPAR 14 (PIN) ×1 IMPLANT
PIN CASPAR 14MM (PIN) ×2
SCREW COROENTSELF-TAP 4X14MM (Screw) ×12 IMPLANT
SPOGE SURGIFLO 8M (HEMOSTASIS) ×2
SPONGE KITTNER 5P (MISCELLANEOUS) ×2 IMPLANT
SPONGE SURGIFLO 8M (HEMOSTASIS) ×2 IMPLANT
STRIP CLOSURE SKIN 1/2X4 (GAUZE/BANDAGES/DRESSINGS) IMPLANT
SUT V-LOC 90 ABS DVC 3-0 CL (SUTURE) ×2 IMPLANT
SUT VIC AB 3-0 SH 8-18 (SUTURE) ×2 IMPLANT
SYR 30ML LL (SYRINGE) ×2 IMPLANT
TAPE ADH 3 LX (MISCELLANEOUS) ×2 IMPLANT
TOWEL OR 17X26 4PK STRL BLUE (TOWEL DISPOSABLE) ×8 IMPLANT
TRAY FOLEY W/METER SILVER 16FR (SET/KITS/TRAYS/PACK) ×2 IMPLANT
TUBING CONNECTING 10 (TUBING) ×2 IMPLANT

## 2016-10-22 NOTE — Progress Notes (Signed)
  History: DEYLAN CANTERBURY is POD0 ACDF C3-4, C6-7 with removal of prior plate C4-6. Patient tolerated procedure well without complications. Patient was seen shortly after surgery and was still hazy with anesthesia, but states he is doing well with little pain.   Physical Exam: Vitals:   10/22/16 1425 10/22/16 1436  BP: 124/71 (!) 123/94  Pulse: 70 74  Resp: 20 15  Temp:  (!) 97 F (36.1 C)  SpO2: 96% 92%    AA Ox3 CNI Strength:5/5 upper and lower extremities. Sensation intact and symmetric upper and lower extremities.   Assessment/Plan:  TREBOR GALDAMEZ is POD0 ACDF. Patient is doing well and physical exam is reassuring. Will reevaluate patient in the morning to determine resolution of symptoms that were present prior to surgery and pain assessment level.   - mobilize - pain control - DVT prophylaxis  Marin Olp PA-C Department of Neurosurgery

## 2016-10-22 NOTE — Op Note (Signed)
Indications: [name] is a 66 yo male with prior history of C4-6 anterior cervical discectomy and fusion who presented with progressive myelopathy due to stenosis and spondylosis at C3-4 and C6-7.  He was worsening and elected for surgical intervention.    Findings: severe stenosis C3-4 and C6-7  Preoperative Diagnosis: Cervical myelopathy Postoperative Diagnosis: same   EBL: 20 ml IVF: 2000 ml Drains: none Disposition: Extubated and Stable to PACU Complications: none  No foley catheter was placed.   Preoperative Note:   Risks of surgery discussed include: infection, bleeding, stroke, coma, death, paralysis, CSF leak, nerve/spinal cord injury, numbness, tingling, weakness, complex regional pain syndrome, recurrent stenosis and/or disc herniation, vascular injury, development of instability, neck/back pain, need for further surgery, persistent symptoms, development of deformity, and the risks of anesthesia. They understood these risks and have agreed to proceed.  Operative Note:   Procedure:  1) Anterior cervical diskectomy and fusion at C3/4 and C6/7 2) Anterior cervical instrumentation at C3/4 and C6/7 3) Placement of biomechanical devices at C3/4 and C6/7  4) Use of operative microscope 5) Use of flouroscopy 6) Removal of prior C4-6 plate   Procedure: After obtaining informed consent, the patient taken to the operating room, placed in supine position, general anesthesia induced.  The patient had a small shoulder roll placed behind their shoulders.  The patient received preop antibiotics and IV Decadron.  The patient had a neck incision outlined, was prepped and draped in usual sterile fashion. The incision was injected with local anesthetic.   An incision was opened, dissection taken down medial to the carotid artery and jugular vein, lateral to the trachea and esophagus.  The prevertebral fascia identified and the prior plate was identified for localization. The longus colli were  dissected laterally, and self-retaining retractors placed to open the operative field. The microscope was then brought into the field.  The entire prior plate was identified, and the screws were removed.  The plate was then removed.  With this complete, distractor pins were placed in the vertebral bodies of C3 and C5. The distractor was placed, and the annulus at C3/4 was opened using a bovie.  Curettes and pituitary rongeurs used to remove the majority of disk, then the drill was used to remove the posterior osteophyte and begin the foraminotomies. The nerve hook was used to elevate the posterior longitudinal ligament, which was then removed with Kerrison rongeurs. The microblunt nerve hook could be passed out the foramen bilateral at each level.   Meticulous hemostasis obtained.  A biomechanical device (Nuvasive Coroent Interlock 7 mm height x 17 mm width by 14 mm depth) was placed at C3/4. Using the awl, starting points were made for the screws.  1 70mm screw was placed into C4, and 2 95mm screws were placed into C3.  The Caspar pin at C3 was removed and bone wax used for hemostasis for the pin holes.    Each device had been filled with allograft for aid in arthrodesis.  Final AP and lateral radiographs were taken.   With everything in good position, the wound was irrigated copiously with bacitracin-containing solution and meticulous hemostasis obtained.  Wound was closed in 2 layers using interrupted inverted 3-0 Vicryl sutures in the platysma and 3-0 monocryl on the dermis.  The wound was dressed with dermabond, the head of bed at 30 degrees, taken to recovery room in stable condition.  No new postop neurological deficits were identified.  Sponge and pattie counts were correct at the end  of the procedure.   Monitoring was used throughout without any changes.  I performed the entire procedure with the assistance of Marin Olp PA as an Pensions consultant.  Meade Maw MD

## 2016-10-22 NOTE — Anesthesia Post-op Follow-up Note (Signed)
Anesthesia QCDR form completed.        

## 2016-10-22 NOTE — OR Nursing (Signed)
One plate and 5 screws explanted from cervical spine

## 2016-10-22 NOTE — Transfer of Care (Signed)
Immediate Anesthesia Transfer of Care Note  Patient: Louis Gutierrez  Procedure(s) Performed: ANTERIOR CERVICAL DECOMPRESSION/DISCECTOMY FUSION 2 LEVELS C3-4, C6-7 (N/A Spine Cervical)  Patient Location: PACU  Anesthesia Type:General  Level of Consciousness: sedated  Airway & Oxygen Therapy: Patient Spontanous Breathing and Patient connected to nasal cannula oxygen  Post-op Assessment: Report given to RN and Post -op Vital signs reviewed and stable  Post vital signs: Reviewed and stable  Last Vitals:  Vitals:   10/22/16 0821  BP: (!) 141/83  Pulse: 74  Resp: 18  Temp: (!) 35.5 C  SpO2: 99%    Last Pain:  Vitals:   10/22/16 0821  TempSrc: Oral  PainSc: 4       Patients Stated Pain Goal: 2 (29/19/16 6060)  Complications: No apparent anesthesia complications

## 2016-10-22 NOTE — Anesthesia Preprocedure Evaluation (Signed)
Anesthesia Evaluation  Patient identified by MRN, date of birth, ID band Patient awake    Reviewed: Allergy & Precautions, NPO status , Patient's Chart, lab work & pertinent test results  Airway Mallampati: II  TM Distance: >3 FB Neck ROM: Limited   Comment: C-spine fusion--C2-3--for radicular pain. Limited extension and flexion. Dental  (+) Missing, Loose, Poor Dentition, Dental Advisory Given,    Pulmonary asthma , sleep apnea and Continuous Positive Airway Pressure Ventilation , COPD,  COPD inhaler, Current Smoker,  Pack per day for many years.    + decreased breath sounds      Cardiovascular Exercise Tolerance: Poor hypertension, Pt. on medications + CAD, + Past MI and + Peripheral Vascular Disease   Rhythm:Regular Rate:Normal  Hx of MI in 2012, and a CVA, on Plavix and ASA--held for these procedures.   Neuro/Psych PSYCHIATRIC DISORDERS Depression C-spine fusion. CVA, No Residual Symptoms    GI/Hepatic negative GI ROS, Neg liver ROS,   Endo/Other  negative endocrine ROS  Renal/GU Renal diseaseStones.  negative genitourinary   Musculoskeletal  (+) Arthritis , Osteoarthritis,    Abdominal (+)  Abdomen: soft.    Peds negative pediatric ROS (+)  Hematology negative hematology ROS (+)   Anesthesia Other Findings Past Medical History: No date: Asthma without status asthmaticus No date: Atherosclerotic cerebrovascular disease     Comment:  Overview:  Small and facial weakness with minimal               weakness  No date: CAD (coronary artery disease)     Comment:  a. 05/2010 Inf STEMI/PCI: RCA 152m (3.5x24 Promus DES),               LCX small, 100, D1 50ost, D2 40ost, EF 45-50%; b. 03/2013               Cath ([facility]): Report not available - D/C summary says              no significant dzs. No date: Cervical disc disease     Comment:  a. s/p C3-4 fusion 2003. No date: Cervical myelopathy ([place])     Comment:  a.  09/2016 Admit w/ left sided wkns->found to have               progressive cervical disc dzs above and below prior               fusioin site. No date: COPD (chronic obstructive pulmonary disease) ([place]) No date: Cryptogenic stroke [facility])     Comment:  a. 05/2013 s/p acute L MCA territory infarct; b. 07/2013               s/p MDT Linq ILR-->No AFib to documented to date               (09/2016). No date: Depression No date: Essential (primary) hypertension No date: H/O insomnia No date: Inguinal hernia No date: Ischemic cardiomyopathy     Comment:  a. 05/2010 LV gram: EF 45-50% @ time of inf MI; b. 09/2016              Echo: EF 50-55%, inf HK, Gr1 DD, mild MR. No date: Mixed hyperlipidemia No date: Nephrolithiasis No date: Obesity No date: OSA (obstructive sleep apnea) No date: Osteoarthritis 06/07/2010: STEMI (ST elevation myocardial infarction) ([place]) No date: Tobacco abuse No date: Urinary incontinence No date: Varicose veins  Reproductive/Obstetrics  Anesthesia Physical  Anesthesia Plan  ASA: III  Anesthesia Plan: General   Post-op Pain Management:    Induction: Intravenous  PONV Risk Score and Plan:   Airway Management Planned: Oral ETT  Additional Equipment:   Intra-op Plan:   Post-operative Plan: Extubation in OR  Informed Consent: I have reviewed the patients History and Physical, chart, labs and discussed the procedure including the risks, benefits and alternatives for the proposed anesthesia with the patient or authorized representative who has indicated his/her understanding and acceptance.   Dental advisory given  Plan Discussed with: CRNA and Surgeon  Anesthesia Plan Comments:         Anesthesia Quick Evaluation

## 2016-10-22 NOTE — Anesthesia Procedure Notes (Signed)
Procedure Name: Intubation Date/Time: 10/22/2016 10:23 AM Performed by: Rosaria Ferries, Demon Volante Pre-anesthesia Checklist: Patient identified, Emergency Drugs available, Suction available and Patient being monitored Patient Re-evaluated:Patient Re-evaluated prior to induction Oxygen Delivery Method: Circle system utilized Preoxygenation: Pre-oxygenation with 100% oxygen Induction Type: IV induction Laryngoscope Size: Mac and 3 Grade View: Grade II Tube size: 7.0 mm Number of attempts: 1 Airway Equipment and Method: Stylet Placement Confirmation: ETT inserted through vocal cords under direct vision,  positive ETCO2 and breath sounds checked- equal and bilateral Secured at: 23 cm Tube secured with: Tape Dental Injury: Teeth and Oropharynx as per pre-operative assessment

## 2016-10-22 NOTE — H&P (Signed)
I have reviewed and confirmed my history and physical from 10/09/16 with no additions or changes. Plan for C3-4 and C6-7 ACDF with possible removal of prior instrumentation.  Risks and benefits reviewed.  Heart sounds normal no MRG. Chest Clear to Auscultation Bilaterally.

## 2016-10-23 ENCOUNTER — Encounter: Payer: Self-pay | Admitting: Neurosurgery

## 2016-10-23 ENCOUNTER — Observation Stay: Payer: Medicare HMO

## 2016-10-23 DIAGNOSIS — J449 Chronic obstructive pulmonary disease, unspecified: Secondary | ICD-10-CM | POA: Diagnosis not present

## 2016-10-23 DIAGNOSIS — F17210 Nicotine dependence, cigarettes, uncomplicated: Secondary | ICD-10-CM | POA: Diagnosis not present

## 2016-10-23 DIAGNOSIS — G4733 Obstructive sleep apnea (adult) (pediatric): Secondary | ICD-10-CM | POA: Diagnosis not present

## 2016-10-23 DIAGNOSIS — M4712 Other spondylosis with myelopathy, cervical region: Secondary | ICD-10-CM | POA: Diagnosis not present

## 2016-10-23 DIAGNOSIS — M4802 Spinal stenosis, cervical region: Secondary | ICD-10-CM | POA: Diagnosis not present

## 2016-10-23 DIAGNOSIS — M4322 Fusion of spine, cervical region: Secondary | ICD-10-CM | POA: Diagnosis not present

## 2016-10-23 DIAGNOSIS — I2510 Atherosclerotic heart disease of native coronary artery without angina pectoris: Secondary | ICD-10-CM | POA: Diagnosis not present

## 2016-10-23 DIAGNOSIS — I10 Essential (primary) hypertension: Secondary | ICD-10-CM | POA: Diagnosis not present

## 2016-10-23 DIAGNOSIS — E669 Obesity, unspecified: Secondary | ICD-10-CM | POA: Diagnosis not present

## 2016-10-23 DIAGNOSIS — J45909 Unspecified asthma, uncomplicated: Secondary | ICD-10-CM | POA: Diagnosis not present

## 2016-10-23 MED ORDER — OXYCODONE HCL 5 MG PO TABS: 5.0000 mg | ORAL_TABLET | ORAL | 0 refills | Status: DC | PRN

## 2016-10-23 MED ORDER — METHOCARBAMOL 500 MG PO TABS: 500.0000 mg | ORAL_TABLET | Freq: Four times a day (QID) | ORAL | 0 refills | Status: DC | PRN

## 2016-10-23 NOTE — Anesthesia Postprocedure Evaluation (Signed)
Anesthesia Post Note  Patient: Louis Gutierrez  Procedure(s) Performed: ANTERIOR CERVICAL DECOMPRESSION/DISCECTOMY FUSION 2 LEVELS C3-4, C6-7 (N/A Spine Cervical)  Patient location during evaluation: PACU Anesthesia Type: General Level of consciousness: awake and alert and oriented Pain management: pain level controlled Vital Signs Assessment: post-procedure vital signs reviewed and stable Respiratory status: spontaneous breathing Cardiovascular status: blood pressure returned to baseline Anesthetic complications: no     Last Vitals:  Vitals:   10/23/16 0736 10/23/16 0739  BP: (!) 146/82   Pulse: 70   Resp: 16   Temp: 36.6 C   SpO2: 93% 93%    Last Pain:  Vitals:   10/23/16 0736  TempSrc: Oral  PainSc:                  [name]

## 2016-10-23 NOTE — Discharge Summary (Signed)
  History: Louis Gutierrez is POD1 s/p ACDF C3-4 , C6-7, with removal of prior plate C4-6. Patient tolerated procedure well without complications. Symptoms prior to surgery have resolved. Feels strength has greater improved already. Films have been taken, patient is able to void and eat without issue. No difficulty swallowing. Still has not yet ambulated since surgery. Pain currently 2/10 controlled with tylenol.   Physical Exam: Vitals:   10/23/16 0736 10/23/16 0739  BP: (!) 146/82   Pulse: 70   Resp: 16   Temp: 97.9 F (36.6 C)   SpO2: 93% 93%    AA Ox3 CNI Skin: Incision site intact. Glue present. No drainage.  Strength:4+/5 left illiopsoas, grip, and deltoid. 5/5 otherwise. Sensation intact and symmetric upper and lower extremities.   EXAM: CERVICAL SPINE - 2-3 VIEW  COMPARISON:  Fluoroscopy from yesterday  FINDINGS: C3-4 and C6-7 ACDF. Hardware is in unremarkable position. Pre-existing intervening solid arthrodesis across the disc spaces. No evidence of fracture or abnormal subsidence. Inferior visualization is limited in the lateral projection due to overlapping shoulders. Prevertebral soft tissue swelling and gas as commonly seen.  IMPRESSION: 1. New C3-4 and C6-7 ACDF. Unremarkable hardware positioning. No acute osseous finding. 2. Prevertebral swelling and gas that is mild for 1 day postop.   Electronically Signed   By: Monte Fantasia M.D.   On: 10/23/2016 08:02  Assessment/Plan:  Louis Gutierrez is doing very well post operatively. Pain adequately controlled with tylenol but will continue oxycodone and robaxin for breakthrough pain. Symptoms prior to surgery are improving. Incision site looks well. No issues with swallowing. Patient ambulated without difficulty or imbalance. Films show intact hardware. He is scheduled for 2 week follow up in clinic to monitor progress.    Marin Olp PA-C Department of Neurosurgery

## 2016-10-23 NOTE — Care Management Obs Status (Signed)
MEDICARE OBSERVATION STATUS NOTIFICATION   Patient Details  Name: Louis Gutierrez MRN: [id number] Date of Birth: [birth year]   Medicare Observation Status Notification Given:  Yes    Jolly Mango, RN 10/23/2016, 8:43 AM

## 2016-10-23 NOTE — Discharge Instructions (Signed)

## 2016-10-23 NOTE — Progress Notes (Signed)
Discharge summary reviewed with verbal understanding. Answered all questions. One narcotic Rx given upon discharge. Escorted to personal vehicle via wc

## 2016-10-24 DIAGNOSIS — I10 Essential (primary) hypertension: Secondary | ICD-10-CM | POA: Diagnosis not present

## 2016-10-24 DIAGNOSIS — R2689 Other abnormalities of gait and mobility: Secondary | ICD-10-CM | POA: Diagnosis not present

## 2016-10-24 DIAGNOSIS — M6281 Muscle weakness (generalized): Secondary | ICD-10-CM | POA: Diagnosis not present

## 2016-10-24 DIAGNOSIS — Z4789 Encounter for other orthopedic aftercare: Secondary | ICD-10-CM | POA: Diagnosis not present

## 2016-10-27 DIAGNOSIS — M6281 Muscle weakness (generalized): Secondary | ICD-10-CM | POA: Diagnosis not present

## 2016-10-27 DIAGNOSIS — I10 Essential (primary) hypertension: Secondary | ICD-10-CM | POA: Diagnosis not present

## 2016-10-27 DIAGNOSIS — Z4789 Encounter for other orthopedic aftercare: Secondary | ICD-10-CM | POA: Diagnosis not present

## 2016-10-27 DIAGNOSIS — R2689 Other abnormalities of gait and mobility: Secondary | ICD-10-CM | POA: Diagnosis not present

## 2016-10-28 ENCOUNTER — Encounter: Payer: Self-pay | Admitting: Neurosurgery

## 2016-10-28 DIAGNOSIS — M6281 Muscle weakness (generalized): Secondary | ICD-10-CM | POA: Diagnosis not present

## 2016-10-28 DIAGNOSIS — Z4789 Encounter for other orthopedic aftercare: Secondary | ICD-10-CM | POA: Diagnosis not present

## 2016-10-28 DIAGNOSIS — R2689 Other abnormalities of gait and mobility: Secondary | ICD-10-CM | POA: Diagnosis not present

## 2016-10-28 DIAGNOSIS — I10 Essential (primary) hypertension: Secondary | ICD-10-CM | POA: Diagnosis not present

## 2016-10-30 DIAGNOSIS — R2689 Other abnormalities of gait and mobility: Secondary | ICD-10-CM | POA: Diagnosis not present

## 2016-10-30 DIAGNOSIS — Z4789 Encounter for other orthopedic aftercare: Secondary | ICD-10-CM | POA: Diagnosis not present

## 2016-10-30 DIAGNOSIS — M6281 Muscle weakness (generalized): Secondary | ICD-10-CM | POA: Diagnosis not present

## 2016-10-30 DIAGNOSIS — I10 Essential (primary) hypertension: Secondary | ICD-10-CM | POA: Diagnosis not present

## 2016-11-04 DIAGNOSIS — R2689 Other abnormalities of gait and mobility: Secondary | ICD-10-CM | POA: Diagnosis not present

## 2016-11-04 DIAGNOSIS — M6281 Muscle weakness (generalized): Secondary | ICD-10-CM | POA: Diagnosis not present

## 2016-11-04 DIAGNOSIS — I10 Essential (primary) hypertension: Secondary | ICD-10-CM | POA: Diagnosis not present

## 2016-11-04 DIAGNOSIS — Z4789 Encounter for other orthopedic aftercare: Secondary | ICD-10-CM | POA: Diagnosis not present

## 2016-11-07 DIAGNOSIS — Z4789 Encounter for other orthopedic aftercare: Secondary | ICD-10-CM | POA: Diagnosis not present

## 2016-11-07 DIAGNOSIS — I10 Essential (primary) hypertension: Secondary | ICD-10-CM | POA: Diagnosis not present

## 2016-11-07 DIAGNOSIS — M6281 Muscle weakness (generalized): Secondary | ICD-10-CM | POA: Diagnosis not present

## 2016-11-07 DIAGNOSIS — R2689 Other abnormalities of gait and mobility: Secondary | ICD-10-CM | POA: Diagnosis not present

## 2016-11-12 DIAGNOSIS — M6281 Muscle weakness (generalized): Secondary | ICD-10-CM | POA: Diagnosis not present

## 2016-11-12 DIAGNOSIS — Z4789 Encounter for other orthopedic aftercare: Secondary | ICD-10-CM | POA: Diagnosis not present

## 2016-11-12 DIAGNOSIS — I10 Essential (primary) hypertension: Secondary | ICD-10-CM | POA: Diagnosis not present

## 2016-11-12 DIAGNOSIS — R2689 Other abnormalities of gait and mobility: Secondary | ICD-10-CM | POA: Diagnosis not present

## 2016-11-13 DIAGNOSIS — Z4789 Encounter for other orthopedic aftercare: Secondary | ICD-10-CM | POA: Diagnosis not present

## 2016-11-13 DIAGNOSIS — I10 Essential (primary) hypertension: Secondary | ICD-10-CM | POA: Diagnosis not present

## 2016-11-13 DIAGNOSIS — M6281 Muscle weakness (generalized): Secondary | ICD-10-CM | POA: Diagnosis not present

## 2016-11-13 DIAGNOSIS — R2689 Other abnormalities of gait and mobility: Secondary | ICD-10-CM | POA: Diagnosis not present

## 2016-11-20 ENCOUNTER — Ambulatory Visit: Payer: Medicare HMO | Admitting: Physician Assistant

## 2016-11-20 DIAGNOSIS — Z981 Arthrodesis status: Secondary | ICD-10-CM | POA: Diagnosis not present

## 2016-11-20 DIAGNOSIS — M4322 Fusion of spine, cervical region: Secondary | ICD-10-CM | POA: Diagnosis not present

## 2016-11-20 DIAGNOSIS — G959 Disease of spinal cord, unspecified: Secondary | ICD-10-CM | POA: Diagnosis not present

## 2016-11-25 ENCOUNTER — Ambulatory Visit (INDEPENDENT_AMBULATORY_CARE_PROVIDER_SITE_OTHER): Payer: Medicare HMO

## 2016-11-25 ENCOUNTER — Encounter: Payer: Self-pay | Admitting: Nurse Practitioner

## 2016-11-25 ENCOUNTER — Other Ambulatory Visit: Payer: Self-pay | Admitting: Nurse Practitioner

## 2016-11-25 ENCOUNTER — Ambulatory Visit: Payer: Medicare HMO | Admitting: Physician Assistant

## 2016-11-25 ENCOUNTER — Ambulatory Visit: Payer: Medicare HMO | Admitting: Nurse Practitioner

## 2016-11-25 VITALS — BP 120/78 | HR 83 | Ht 71.0 in | Wt 215.5 lb

## 2016-11-25 DIAGNOSIS — E782 Mixed hyperlipidemia: Secondary | ICD-10-CM

## 2016-11-25 DIAGNOSIS — I10 Essential (primary) hypertension: Secondary | ICD-10-CM | POA: Diagnosis not present

## 2016-11-25 DIAGNOSIS — M7989 Other specified soft tissue disorders: Secondary | ICD-10-CM

## 2016-11-25 DIAGNOSIS — R609 Edema, unspecified: Secondary | ICD-10-CM

## 2016-11-25 DIAGNOSIS — M79602 Pain in left arm: Secondary | ICD-10-CM

## 2016-11-25 DIAGNOSIS — I2510 Atherosclerotic heart disease of native coronary artery without angina pectoris: Secondary | ICD-10-CM | POA: Diagnosis not present

## 2016-11-25 DIAGNOSIS — Z720 Tobacco use: Secondary | ICD-10-CM

## 2016-11-25 NOTE — Patient Instructions (Signed)
Medication Instructions:  Your physician recommends that you continue on your current medications as directed. Please refer to the Current Medication list given to you today.   Labwork: none  Testing/Procedures: Left upper extremity venous duplex to rule out DVT  Follow-Up: Your physician wants you to follow-up in: 1 year with Dr. Sallyanne Kuster.  You will receive a reminder letter in the mail two months in advance. If you don't receive a letter, please call our office to schedule the follow-up appointment.   Any Other Special Instructions Will Be Listed Below (If Applicable).     If you need a refill on your cardiac medications before your next appointment, please call your pharmacy.

## 2016-11-25 NOTE — Progress Notes (Signed)
Office Visit    Patient Name: Louis Gutierrez Date of Encounter: 11/25/2016  Primary Care Provider:  Kirk Ruths, MD Primary Cardiologist:  Jerilynn Mages. Croitoru, MD   Chief Complaint    66 y/o ? with a history of CAD status post inferior STEMI and RCA stenting, cryptogenic stroke, hypertension, hyperlipidemia, tobacco abuse, and cervical disc disease who presents for follow-up after recent cervical discectomy.  Past Medical History    Past Medical History:  Diagnosis Date  . Asthma without status asthmaticus   . Atherosclerotic cerebrovascular disease    Overview:  Small and facial weakness with minimal weakness   . CAD (coronary artery disease)    a. 05/2010 Inf STEMI/PCI: RCA 152m (3.5x24 Promus DES), LCX small, 100, D1 50ost, D2 40ost, EF 45-50%; b. 03/2013 Cath ([facility]): Report not available - D/C summary says no significant dzs.  . Cervical disc disease    a. s/p C3-4 fusion 2003.  Marland Kitchen Cervical myelopathy ([place])    a. 09/2016 Admit w/ left sided wkns->found to have progressive cervical disc dzs above and below prior fusioin site; b. 10/2016 s/p ant cervical diskectomy.  Marland Kitchen COPD (chronic obstructive pulmonary disease) ([place])   . Cryptogenic stroke ([place])    a. 05/2013 s/p acute L MCA territory infarct; b. 07/2013 s/p MDT Linq ILR-->No AFib to documented to date (09/2016).  . Depression   . Essential (primary) hypertension   . H/O insomnia   . Inguinal hernia   . Ischemic cardiomyopathy    a. 05/2010 LV gram: EF 45-50% @ time of inf MI; b. 09/2016 Echo: EF 50-55%, inf HK, Gr1 DD, mild MR.  . Mixed hyperlipidemia   . Nephrolithiasis   . Obesity   . OSA (obstructive sleep apnea)   . Osteoarthritis   . STEMI (ST elevation myocardial infarction) ([place]) 06/07/2010  . Tobacco abuse   . Urinary incontinence   . Varicose veins    Past Surgical History:  Procedure Laterality Date  . BACK SURGERY  2003   C3C4  . CARDIAC CATHETERIZATION  06/07/2010   50 % ostial narrowing in 1st  diagonal from LAD, 2nd diagonal had 40% ostial narrowing; AV groove circumflex was diminutive and occluded proximally; RCA totally occluded in mid segment - stented with 3.5 x 25mm Promus Element DES  - See Media tab  . CARDIAC CATHETERIZATION  05/2013   [facility]  . CERVICAL FUSION     c4/5/6  . LOOP RECORDER IMPLANT  08/16/13   Dr. Sallyanne Kuster    Allergies  Allergies  Allergen Reactions  . Penicillins Hives and Nausea And Vomiting    As a child Has patient had a PCN reaction causing immediate rash, facial/tongue/throat swelling, SOB or lightheadedness with hypotension: No Has patient had a PCN reaction causing severe rash involving mucus membranes or skin necrosis: No Has patient had a PCN reaction that required hospitalization No Has patient had a PCN reaction occurring within the last 10 years: No If all of the above answers are "NO", then may proceed with Cephalosporin use.     History of Present Illness    66 y/o ? with the above complex past medical history including CAD, ischemic cardia myopathy, hypertension, tobacco abuse, hyperlipidemia, COPD, cryptogenic stroke, and cervical disc disease status post prior cervical fusion.  In May 2012, he suffered an inferior STEMI and found to have a total occlusion of the right coronary artery which was successfully treated with drug-eluting stent.  He was also noted to have a  total occlusion of a small left circumflex and otherwise nonobstructive disease in diagonal branches.  Repeat catheterization 2015 showed nonobstructive disease and he was medically managed.  In May 2015, he suffered a cryptogenic, left MCA territory stroke.  Subsequent implantable loop recorder was never showed any atrial fibrillation or significant arrhythmias.  He was recently admitted to [facility] with unsteady gait, lightheadedness, and left-sided weakness.  Head CT did not show any acute changes.  He was seen by neurology.  Carotid ultrasound did not show any  significant disease.  MRI/A showed no acute intracranial abnormality with old left basal ganglia lacunar infarct.  MRI of the cervical spine showed progressive C3-4 and C6-7 disease with moderate canal and severe bilateral C7 foraminal stenosis.  He was seen by neurosurgery with recommendation for outpatient cervical discectomy.  We saw him during his hospitalization for preop clearance.  He was having no cardiac symptoms at that time.  Patient underwent successful anterior cervical discectomy on October 3.  He says postoperative course was uneventful and has done well from a cardiac standpoint without chest pain, dyspnea, palpitations, PND, orthopnea, dizziness, syncope, or early satiety.  He continues to smoke 1 pack a day.  Beginning yesterday morning, he began experiencing left hand swelling which now also involves his left wrist/distal forearm.  In the setting of swelling he has had some pain.  There is no pain or swelling of the upper arm.  He is not noted any redness or palpable cord.  He is not sure if he had an IV on that side for his surgery in early October.  Home Medications    Prior to Admission medications   Medication Sig Start Date End Date Taking? Authorizing Provider  acetaminophen (TYLENOL) 500 MG tablet Take 1,000 mg by mouth every 8 (eight) hours as needed for mild pain.    Yes [provider]  ADVAIR DISKUS 100-50 MCG/DOSE AEPB Inhale 1 puff into the lungs 2 (two) times daily. 01/02/16  Yes [provider]  aspirin 81 MG chewable tablet Chew 81 mg daily by mouth.   Yes [provider]  COMBIVENT RESPIMAT 20-100 MCG/ACT AERS respimat Inhale 1 puff into the lungs See admin instructions. Takes 1 puff twice a day. Also uses it as a rescue inhaler 1 puff every 6 hours as needed. 01/02/16  Yes [provider]  isosorbide mononitrate (IMDUR) 30 MG 24 hr tablet TAKE ONE (1) TABLET EACH DAY 08/05/16  Yes Croitoru, Mihai, MD  methocarbamol (ROBAXIN) 500 MG  tablet Take 1 tablet (500 mg total) by mouth every 6 (six) hours as needed for muscle spasms. 10/23/16  Yes Marin Olp, PA-C  nitroGLYCERIN (NITROSTAT) 0.4 MG SL tablet Place 0.4 mg under the tongue every 5 (five) minutes as needed for chest pain.  04/18/14  Yes [provider]  oxyCODONE (OXY IR/ROXICODONE) 5 MG immediate release tablet Take 1-2 tablets (5-10 mg total) by mouth every 3 (three) hours as needed for severe pain or breakthrough pain. 10/23/16  Yes Marin Olp, PA-C  PARoxetine (PAXIL) 40 MG tablet Take 40 mg by mouth daily.  11/16/13  Yes [provider]  ramipril (ALTACE) 10 MG capsule TAKE ONE (1) CAPSULE EACH DAY 03/24/16  Yes Croitoru, Mihai, MD  traZODone (DESYREL) 100 MG tablet Take 50 mg by mouth at bedtime as needed for sleep.    Yes [provider]  pravastatin (PRAVACHOL) 40 MG tablet Take 1 tablet (40 mg total) by mouth daily. Patient not taking: Reported on  11/25/2016 03/19/16   Croitoru, Mihai, MD    Review of Systems    Left arm/hand swelling as above.  He denies chest pain, palpitations, dyspnea, pnd, orthopnea, n, v, dizziness, syncope, edema, weight gain, or early satiety.  All other systems reviewed and are otherwise negative except as noted above.  Physical Exam    VS:  BP 120/78 (BP Location: Right Arm, Patient Position: Sitting, Cuff Size: Normal)   Pulse 83   Ht 5\' 11"  (1.803 m)   Wt 215 lb 8 oz (97.8 kg)   BMI 30.06 kg/m  , BMI Body mass index is 30.06 kg/m. GEN: Well nourished, well developed, in no acute distress.  HEENT: normal.  Neck: Supple, no JVD, carotid bruits, or masses. Cardiac: RRR, no murmurs, rubs, or gallops. No clubbing, cyanosis.  Radials/DP/PT 2+ and equal bilaterally.  The left hand and distal forearm are moderately swollen with trace pitting edema over both areas.  In that setting, he has difficulty moving his fingers.  No erythema or palpable cord.  Sensation and pulses are normal. Respiratory:  Respirations  regular and unlabored, clear to auscultation bilaterally. GI: Soft, nontender, nondistended, BS + x 4. MS: no deformity or atrophy. Skin: warm and dry, no rash. Neuro:  Strength and sensation are intact. Psych: Normal affect.  Accessory Clinical Findings    ECG -regular sinus rhythm, 83, prior inferoposterior infarct without acute changes.  Assessment & Plan    1.  Coronary artery disease: Status post prior inferior MI with stenting of the RCA.  Known occlusion of a small left circumflex.  He has been doing well from a cardiac standpoint without chest pain or dyspnea.  He remains on aspirin, nitrate, statin, and ACE inhibitor therapy.  2.  Left upper extremity swelling: Patient woke up yesterday with swelling of the left hand which persisted throughout the day yesterday and now is also associated with left distal forearm swelling.  He has good distal pulses, sensation, and is without erythema or palpable cord.  He thinks he may have had an IV in that side at the time of his surgery last month.  He wonders if this might be related to his cervical disc surgery though it only just started yesterday.  I did obtain a left upper extremity ultrasound and equal to merrily, there is no evidence for DVT.  3.  Essential hypertension: Stable on nitrate and ACE inhibitor therapy.  4.  Hyperlipidemia: LDL was 62 in September.  Recommend continued statin.  He is currently holding his Pravachol for unclear reasons.  5.  Ongoing tobacco abuse with COPD: No active wheezing.  Complete cessation advised.  He realizes he needs to quit but continues to smoke 1 pack a day.  6.  Disposition: Follow-up with Dr. Sallyanne Kuster in 1 year or sooner if necessary.   Murray Hodgkins, NP 11/25/2016, 4:35 PM

## 2016-11-25 NOTE — Progress Notes (Signed)
Thanks, Chris MCr 

## 2016-12-23 DIAGNOSIS — M1120 Other chondrocalcinosis, unspecified site: Secondary | ICD-10-CM | POA: Diagnosis not present

## 2017-01-06 DIAGNOSIS — J449 Chronic obstructive pulmonary disease, unspecified: Secondary | ICD-10-CM | POA: Diagnosis not present

## 2017-01-06 DIAGNOSIS — I10 Essential (primary) hypertension: Secondary | ICD-10-CM | POA: Diagnosis not present

## 2017-01-06 DIAGNOSIS — F325 Major depressive disorder, single episode, in full remission: Secondary | ICD-10-CM | POA: Diagnosis not present

## 2017-01-06 DIAGNOSIS — E119 Type 2 diabetes mellitus without complications: Secondary | ICD-10-CM | POA: Diagnosis not present

## 2017-01-06 DIAGNOSIS — I25119 Atherosclerotic heart disease of native coronary artery with unspecified angina pectoris: Secondary | ICD-10-CM | POA: Diagnosis not present

## 2017-01-08 DIAGNOSIS — Z981 Arthrodesis status: Secondary | ICD-10-CM | POA: Diagnosis not present

## 2017-01-08 DIAGNOSIS — M4322 Fusion of spine, cervical region: Secondary | ICD-10-CM | POA: Diagnosis not present

## 2017-01-08 DIAGNOSIS — G959 Disease of spinal cord, unspecified: Secondary | ICD-10-CM | POA: Diagnosis not present

## 2017-01-14 DIAGNOSIS — J40 Bronchitis, not specified as acute or chronic: Secondary | ICD-10-CM | POA: Diagnosis not present

## 2017-02-04 DIAGNOSIS — M25461 Effusion, right knee: Secondary | ICD-10-CM | POA: Diagnosis not present

## 2017-02-04 DIAGNOSIS — W01198A Fall on same level from slipping, tripping and stumbling with subsequent striking against other object, initial encounter: Secondary | ICD-10-CM | POA: Diagnosis not present

## 2017-02-04 DIAGNOSIS — Z9181 History of falling: Secondary | ICD-10-CM | POA: Diagnosis not present

## 2017-02-04 DIAGNOSIS — S8991XA Unspecified injury of right lower leg, initial encounter: Secondary | ICD-10-CM | POA: Diagnosis not present

## 2017-02-04 DIAGNOSIS — M1731 Unilateral post-traumatic osteoarthritis, right knee: Secondary | ICD-10-CM | POA: Diagnosis not present

## 2017-02-09 DIAGNOSIS — M25561 Pain in right knee: Secondary | ICD-10-CM | POA: Diagnosis not present

## 2017-02-09 DIAGNOSIS — M1731 Unilateral post-traumatic osteoarthritis, right knee: Secondary | ICD-10-CM | POA: Diagnosis not present

## 2017-04-18 ENCOUNTER — Other Ambulatory Visit: Payer: Self-pay | Admitting: Cardiovascular Disease

## 2017-05-07 DIAGNOSIS — Z0100 Encounter for examination of eyes and vision without abnormal findings: Secondary | ICD-10-CM | POA: Diagnosis not present

## 2017-05-08 DIAGNOSIS — H524 Presbyopia: Secondary | ICD-10-CM | POA: Diagnosis not present

## 2017-05-15 ENCOUNTER — Telehealth: Payer: Self-pay | Admitting: Cardiovascular Disease

## 2017-05-15 MED ORDER — NITROGLYCERIN 0.4 MG SL SUBL: 0.4000 mg | SUBLINGUAL_TABLET | SUBLINGUAL | 3 refills | Status: DC | PRN

## 2017-05-15 NOTE — Telephone Encounter (Signed)
New message  Patient dropped medication, needs refill at local pharmacy  *STAT* If patient is at the pharmacy, call can be transferred to refill team.   1. Which medications need to be refilled? (please list name of each medication and dose if known) nitroGLYCERIN (NITROSTAT) 0.4 MG SL tablet  2. Which pharmacy/location (including street and city if local pharmacy) is medication to be sent to?[place], [place]  3. Do they need a 30 day or 90 day supply? [place]

## 2017-06-04 ENCOUNTER — Telehealth: Payer: Self-pay | Admitting: *Deleted

## 2017-06-04 DIAGNOSIS — Z87891 Personal history of nicotine dependence: Secondary | ICD-10-CM

## 2017-06-04 DIAGNOSIS — Z122 Encounter for screening for malignant neoplasm of respiratory organs: Secondary | ICD-10-CM

## 2017-06-04 NOTE — Telephone Encounter (Signed)
Received referral for initial lung cancer screening scan. Contacted patient and obtained smoking history,(current, 53 pack year) as well as answering questions related to screening process. Patient denies signs of lung cancer such as weight loss or hemoptysis. Patient denies comorbidity that would prevent curative treatment if lung cancer were found. Patient is scheduled for shared decision making visit and CT scan on 06/23/17.

## 2017-06-23 ENCOUNTER — Encounter: Payer: Self-pay | Admitting: Oncology

## 2017-06-23 ENCOUNTER — Inpatient Hospital Stay: Payer: Medicare HMO | Attending: Oncology | Admitting: Oncology

## 2017-06-23 ENCOUNTER — Ambulatory Visit
Admission: RE | Admit: 2017-06-23 | Discharge: 2017-06-23 | Disposition: A | Discharge status: Home / Self Care | Payer: Medicare HMO | Source: Ambulatory Visit | Attending: Oncology | Admitting: Oncology

## 2017-06-23 DIAGNOSIS — Z87891 Personal history of nicotine dependence: Secondary | ICD-10-CM

## 2017-06-23 DIAGNOSIS — J432 Centrilobular emphysema: Secondary | ICD-10-CM | POA: Diagnosis not present

## 2017-06-23 DIAGNOSIS — D3502 Benign neoplasm of left adrenal gland: Secondary | ICD-10-CM | POA: Diagnosis not present

## 2017-06-23 DIAGNOSIS — I700 Atherosclerosis of aorta: Secondary | ICD-10-CM | POA: Diagnosis not present

## 2017-06-23 DIAGNOSIS — I2510 Atherosclerotic heart disease of native coronary artery without angina pectoris: Secondary | ICD-10-CM | POA: Insufficient documentation

## 2017-06-23 DIAGNOSIS — Z122 Encounter for screening for malignant neoplasm of respiratory organs: Secondary | ICD-10-CM

## 2017-06-23 DIAGNOSIS — F17210 Nicotine dependence, cigarettes, uncomplicated: Secondary | ICD-10-CM | POA: Diagnosis not present

## 2017-06-23 DIAGNOSIS — N200 Calculus of kidney: Secondary | ICD-10-CM | POA: Diagnosis not present

## 2017-06-23 NOTE — Progress Notes (Signed)
In accordance with CMS guidelines, patient has met eligibility criteria including age, absence of signs or symptoms of lung cancer.  Social History   Tobacco Use  . Smoking status: Current Every Day Smoker    Packs/day: 1.00    Years: 53.00    Pack years: 53.00    Types: Cigarettes  . Smokeless tobacco: Never Used  . Tobacco comment: trying to cut back - currently smoking 0.5 ppd.  Substance Use Topics  . Alcohol use: No  . Drug use: No     A shared decision-making session was conducted prior to the performance of CT scan. This includes one or more decision aids, includes benefits and harms of screening, follow-up diagnostic testing, over-diagnosis, false positive rate, and total radiation exposure.  Counseling on the importance of adherence to annual lung cancer LDCT screening, impact of co-morbidities, and ability or willingness to undergo diagnosis and treatment is imperative for compliance of the program.  Counseling on the importance of continued smoking cessation for former smokers; the importance of smoking cessation for current smokers, and information about tobacco cessation interventions have been given to patient including [place] and [employer] programs.  Written order for lung cancer screening with LDCT has been given to the patient and any and all questions have been answered to the best of my abilities.   Yearly follow up will be coordinated by Burgess Estelle, Thoracic Navigator.  Faythe Casa, NP 06/23/2017 2:16 PM

## 2017-06-26 ENCOUNTER — Encounter: Payer: Self-pay | Admitting: *Deleted

## 2017-07-01 DIAGNOSIS — E119 Type 2 diabetes mellitus without complications: Secondary | ICD-10-CM | POA: Diagnosis not present

## 2017-07-01 DIAGNOSIS — I25119 Atherosclerotic heart disease of native coronary artery with unspecified angina pectoris: Secondary | ICD-10-CM | POA: Diagnosis not present

## 2017-07-01 DIAGNOSIS — I10 Essential (primary) hypertension: Secondary | ICD-10-CM | POA: Diagnosis not present

## 2017-07-01 DIAGNOSIS — J449 Chronic obstructive pulmonary disease, unspecified: Secondary | ICD-10-CM | POA: Diagnosis not present

## 2017-07-08 DIAGNOSIS — Z125 Encounter for screening for malignant neoplasm of prostate: Secondary | ICD-10-CM | POA: Diagnosis not present

## 2017-07-08 DIAGNOSIS — J449 Chronic obstructive pulmonary disease, unspecified: Secondary | ICD-10-CM | POA: Diagnosis not present

## 2017-07-08 DIAGNOSIS — Z0000 Encounter for general adult medical examination without abnormal findings: Secondary | ICD-10-CM | POA: Diagnosis not present

## 2017-07-08 DIAGNOSIS — I25119 Atherosclerotic heart disease of native coronary artery with unspecified angina pectoris: Secondary | ICD-10-CM | POA: Diagnosis not present

## 2017-07-08 DIAGNOSIS — F325 Major depressive disorder, single episode, in full remission: Secondary | ICD-10-CM | POA: Diagnosis not present

## 2017-07-08 DIAGNOSIS — E119 Type 2 diabetes mellitus without complications: Secondary | ICD-10-CM | POA: Diagnosis not present

## 2017-07-08 DIAGNOSIS — I10 Essential (primary) hypertension: Secondary | ICD-10-CM | POA: Diagnosis not present

## 2017-07-18 ENCOUNTER — Other Ambulatory Visit: Payer: Self-pay | Admitting: Cardiovascular Disease

## 2017-07-20 NOTE — Telephone Encounter (Signed)
Rx(s) sent to pharmacy electronically.  

## 2017-08-28 DIAGNOSIS — N529 Male erectile dysfunction, unspecified: Secondary | ICD-10-CM | POA: Diagnosis not present

## 2017-08-28 DIAGNOSIS — D3502 Benign neoplasm of left adrenal gland: Secondary | ICD-10-CM | POA: Diagnosis not present

## 2017-08-31 DIAGNOSIS — D3502 Benign neoplasm of left adrenal gland: Secondary | ICD-10-CM | POA: Diagnosis not present

## 2017-08-31 DIAGNOSIS — N529 Male erectile dysfunction, unspecified: Secondary | ICD-10-CM | POA: Diagnosis not present

## 2017-09-05 ENCOUNTER — Other Ambulatory Visit: Payer: Self-pay | Admitting: Cardiovascular Disease

## 2017-11-30 ENCOUNTER — Encounter: Payer: Self-pay | Admitting: Cardiovascular Disease

## 2017-11-30 ENCOUNTER — Ambulatory Visit: Payer: Medicare HMO | Admitting: Cardiovascular Disease

## 2017-11-30 VITALS — BP 112/78 | HR 59 | Ht 71.0 in | Wt 212.2 lb

## 2017-11-30 DIAGNOSIS — E7800 Pure hypercholesterolemia, unspecified: Secondary | ICD-10-CM | POA: Diagnosis not present

## 2017-11-30 DIAGNOSIS — Z8673 Personal history of transient ischemic attack (TIA), and cerebral infarction without residual deficits: Secondary | ICD-10-CM

## 2017-11-30 DIAGNOSIS — I2510 Atherosclerotic heart disease of native coronary artery without angina pectoris: Secondary | ICD-10-CM

## 2017-11-30 DIAGNOSIS — I10 Essential (primary) hypertension: Secondary | ICD-10-CM | POA: Diagnosis not present

## 2017-11-30 DIAGNOSIS — F17200 Nicotine dependence, unspecified, uncomplicated: Secondary | ICD-10-CM

## 2017-11-30 NOTE — Patient Instructions (Signed)
Medication Instructions:  Dr Croitoru recommends that you continue on your current medications as directed. Please refer to the Current Medication list given to you today.  If you need a refill on your cardiac medications before your next appointment, please call your pharmacy.   Follow-Up: At [facility], you and your health needs are our priority.  As part of our continuing mission to provide you with exceptional heart care, we have created designated Provider Care Teams.  These Care Teams include your primary Cardiologist (physician) and Advanced Practice Providers (APPs -  Physician Assistants and Nurse Practitioners) who all work together to provide you with the care you need, when you need it. You will need a follow up appointment in 12 months.  Please call our office 2 months in advance to schedule this appointment.  You may see Mihai Croitoru, MD or one of the following Advanced Practice Providers on your designated Care Team: Hao Meng, PA-C . Tabb Croghan Duke, PA-C 

## 2017-11-30 NOTE — Progress Notes (Signed)
Cardiology Office Note    Date:  12/01/2017   ID:  Louis Gutierrez, DOB Apr 16, 1950, MRN [id number]  PCP:  Louis Ruths, MD  Cardiologist:   Louis Klein, MD   No chief complaint on file.   History of Present Illness:  Louis Gutierrez is a 67 y.o. male with coronary artery disease and a history of cryptogenic stroke.    He is doing well from a physical point of view, but is under a lot of emotional stress.  His 67 year old daughter and her 2 children are living with them.  The children's father is "a dead beat" and is not providing any child support despite a court order.  He has been unable to quit smoking and is still smoking roughly a pack a day.  He works 5 days a week at the [employer].  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  Finances are challenging.  His loop recorder has reached end of service and we cannot communicate with his device today.  Doing 3 years of monitoring it never showed atrial fibrillation.  He has not had any new stroke symptoms during this time.  The patient specifically denies any chest pain at rest exertion, dyspnea at rest or with exertion, orthopnea, paroxysmal nocturnal dyspnea, syncope, palpitations, focal neurological deficits, intermittent claudication, lower extremity edema, unexplained weight gain, cough, hemoptysis or wheezing.  He has lost some weight and is no longer obese, although he remains overweight.  He presented with ST segment elevation myocardial infarction in May of 2012. At that time had an inferior wall infarction, manifesting as unstable angina pectoris and eventually a syncopal episode. The right coronary artery was totally occluded and revascularization was difficult but eventually he received a 3.5 x 24 mm Promus drug-eluting stent to the mid RCA. Also noted was 50% ostial stenosis of the first diagonal artery and 40% ostial stenosis of the second diagonal artery branches of the LAD, as well as total  occlusion of a small left circumflex system. There was evidence of inferolateral hypokinesis and ejection fraction of 45-50% at that time. He presented with chest pain to [facility] in 2015 when he had a repeat coronary angiogram that was reportedly normal. I don't have that report. In May 2015 he presented with right hemiparesis and expressive aphasia with an infarction in the territory of the left middle cerebral artery. An etiology was not identified. He underwent loop recorder implantation in August 2015 and so far the device has not recorded any arrhythmia. His most recent echo performed in March 2015 showed left ventricular ejection fraction of 50-55 percent with abnormal relaxation and no regional wall motion abnormalities. Both atria were described as normal in size and there were no serious valvular abnormalities.   Past Medical History:  Diagnosis Date  . Asthma without status asthmaticus   . Atherosclerotic cerebrovascular disease    Overview:  Small and facial weakness with minimal weakness   . CAD (coronary artery disease)    a. 05/2010 Inf STEMI/PCI: RCA 175m (3.5x24 Promus DES), LCX small, 100, D1 50ost, D2 40ost, EF 45-50%; b. 03/2013 Cath ([facility]): Report not available - D/C summary says no significant dzs.  . Cervical disc disease    a. s/p C3-4 fusion 2003.  Marland Kitchen Cervical myelopathy ([place])    a. 09/2016 Admit w/ left sided wkns->found to have progressive cervical disc dzs above and below prior fusioin site; b. 10/2016 s/p ant cervical diskectomy.  Marland Kitchen  COPD (chronic obstructive pulmonary disease) ([place])   . Cryptogenic stroke ([place])    a. 05/2013 s/p acute L MCA territory infarct; b. 07/2013 s/p MDT Linq ILR-->No AFib to documented to date (09/2016).  . Depression   . Essential (primary) hypertension   . H/O insomnia   . Inguinal hernia   . Ischemic cardiomyopathy    a. 05/2010 LV gram: EF 45-50% @ time of inf MI; b. 09/2016 Echo: EF 50-55%, inf HK, Gr1 DD, mild MR.  .  Mixed hyperlipidemia   . Nephrolithiasis   . Obesity   . OSA (obstructive sleep apnea)   . Osteoarthritis   . STEMI (ST elevation myocardial infarction) ([place]) 06/07/2010  . Tobacco abuse   . Urinary incontinence   . Varicose veins     Past Surgical History:  Procedure Laterality Date  . ANTERIOR CERVICAL DECOMP/DISCECTOMY FUSION N/A 10/22/2016   Procedure: ANTERIOR CERVICAL DECOMPRESSION/DISCECTOMY FUSION 2 LEVELS C3-4, C6-7;  Surgeon: Meade Maw, MD;  Location: [facility] ORS;  Service: Neurosurgery;  Laterality: N/A;  . BACK SURGERY  2003   C3C4  . CARDIAC CATHETERIZATION  06/07/2010   50 % ostial narrowing in 1st diagonal from LAD, 2nd diagonal had 40% ostial narrowing; AV groove circumflex was diminutive and occluded proximally; RCA totally occluded in mid segment - stented with 3.5 x 55mm Promus Element DES  - See Media tab  . CARDIAC CATHETERIZATION  05/2013   [place]  . CERVICAL FUSION     c4/5/6  . COLONOSCOPY WITH PROPOFOL N/A 02/26/2015   Procedure: COLONOSCOPY WITH PROPOFOL;  Surgeon: Josefine Class, MD;  Location: [facility] ENDOSCOPY;  Service: Endoscopy;  Laterality: N/A;  . ESOPHAGOGASTRODUODENOSCOPY  02/26/2015   Procedure: ESOPHAGOGASTRODUODENOSCOPY (EGD);  Surgeon: Josefine Class, MD;  Location: [facility] ENDOSCOPY;  Service: Endoscopy;;  . LOOP RECORDER IMPLANT  08/16/13   Dr. Sallyanne Kuster    Current Medications: Outpatient Medications Prior to Visit  Medication Sig Dispense Refill  . acetaminophen (TYLENOL) 500 MG tablet Take 1,000 mg by mouth every 8 (eight) hours as needed for mild pain.     Marland Kitchen aspirin 81 MG chewable tablet Chew 81 mg daily by mouth.    . clopidogrel (PLAVIX) 75 MG tablet Take 1 tablet (75 mg total) by mouth daily. 30 tablet 4  . COMBIVENT RESPIMAT 20-100 MCG/ACT AERS respimat Inhale 1 puff into the lungs See admin instructions. Takes 1 puff twice a day. Also uses it as a rescue inhaler 1 puff every 6 hours as needed.    . isosorbide mononitrate  (IMDUR) 30 MG 24 hr tablet TAKE ONE (1) TABLET EACH DAY 90 tablet 0  . nitroGLYCERIN (NITROSTAT) 0.4 MG SL tablet Place 1 tablet (0.4 mg total) under the tongue every 5 (five) minutes as needed for chest pain. 30 tablet 3  . PARoxetine (PAXIL) 40 MG tablet Take 40 mg by mouth daily.     . pravastatin (PRAVACHOL) 40 MG tablet Take 1 tablet (40 mg total) by mouth daily. 30 tablet 8  . ramipril (ALTACE) 10 MG capsule TAKE ONE (1) CAPSULE EACH DAY 30 capsule 7  . traZODone (DESYREL) 100 MG tablet Take 50 mg by mouth at bedtime as needed for sleep.     Marland Kitchen ADVAIR DISKUS 100-50 MCG/DOSE AEPB Inhale 1 puff into the lungs 2 (two) times daily.    . methocarbamol (ROBAXIN) 500 MG tablet Take 1 tablet (500 mg total) by mouth every 6 (six) hours as needed for muscle spasms. 120 tablet 0  . oxyCODONE (  OXY IR/ROXICODONE) 5 MG immediate release tablet Take 1-2 tablets (5-10 mg total) by mouth every 3 (three) hours as needed for severe pain or breakthrough pain. 30 tablet 0   No facility-administered medications prior to visit.      Allergies:   Penicillins   Social History   Socioeconomic History  . Marital status: Married    Spouse name: Not on file  . Number of children: Not on file  . Years of education: Not on file  . Highest education level: Not on file  Occupational History  . 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  Social Needs  . Financial resource strain: Not on file  . Food insecurity:    Worry: Not on file    Inability: Not on file  . Transportation needs:    Medical: Not on file    Non-medical: Not on file  Tobacco Use  . Smoking status: Current Every Day Smoker    Packs/day: 1.00    Years: 53.00    Pack years: 53.00    Types: Cigarettes  . Smokeless tobacco: Never Used  . Tobacco comment: trying to cut back - currently smoking 0.5 ppd.  Substance and Sexual Activity  . Alcohol use: No  . Drug use: No  . Sexual activity: Not on  file  Lifestyle  . Physical activity:    Days per week: Not on file    Minutes per session: Not on file  . Stress: Not on file  Relationships  . Social connections:    Talks on phone: Not on file    Gets together: Not on file    Attends religious service: Not on file    Active member of club or organization: Not on file    Attends meetings of clubs or organizations: Not on file    Relationship status: Not on file  Other Topics Concern  . Not on file  Social History Narrative   Lives in [place] with wife, dtr, and 2 grandchildren.  Walks dog 30 mins 2x/day w/o difficulties.     Family History:  The patient's family history includes Cancer in his mother; Coronary artery disease in his brother; Diabetes in his brother; Stroke in his maternal grandmother.   ROS:   Please see the history of present illness.    ROS all other systems are reviewed and are negative   PHYSICAL EXAM:   VS:  BP 112/78   Pulse (!) 59   Ht 5\' 11"  (1.803 m)   Wt 212 lb 3.2 oz (96.3 kg)   BMI 29.60 kg/m     General: Alert, oriented x3, no distress, overweight Head: no evidence of trauma, PERRL, EOMI, no exophtalmos or lid lag, no myxedema, no xanthelasma; normal ears, nose and oropharynx Neck: normal jugular venous pulsations and no hepatojugular reflux; brisk carotid pulses without delay and no carotid bruits Chest: clear to auscultation, no signs of consolidation by percussion or palpation, normal fremitus, symmetrical and full respiratory excursions Cardiovascular: normal position and quality of the apical impulse, regular rhythm, normal first and second heart sounds, no murmurs, rubs or gallops Abdomen: no tenderness or distention, no masses by palpation, no abnormal pulsatility or arterial bruits, normal bowel sounds, no hepatosplenomegaly Extremities: no clubbing, cyanosis or edema; 2+ radial, ulnar and brachial pulses bilaterally; 2+ right femoral, posterior tibial and dorsalis pedis pulses; 2+ left  femoral, posterior tibial and dorsalis pedis pulses; no subclavian or femoral bruits Neurological: grossly  nonfocal Psych: Normal mood and affect, but looks worried   Wt Readings from Last 3 Encounters:  11/30/17 212 lb 3.2 oz (96.3 kg)  06/23/17 219 lb (99.3 kg)  11/25/16 215 lb 8 oz (97.8 kg)      Studies/Labs Reviewed:   EKG:  EKG is ordered today.  Sinus rhythm with Q waves of old inferior infarction, otherwise normal.  QTc 413 ms  Recent Labs: August 31, 2017 Creatinine 0.9, BUN 13, potassium 4.4, aldosterone/renin ratio 3.1, cortisol 7.3 (ordered for evaluation of an adrenal adenoma, incidentally discovered during CT of the chest for lung cancer screening)  July 01, 2017  hemoglobin A1c 6.5%  Lipid Panel    Component Value Date/Time   CHOL 116 10/08/2016 0507   CHOL 110 05/22/2013 0526   TRIG 87 10/08/2016 0507   TRIG 162 05/22/2013 0526   HDL 37 (L) 10/08/2016 0507   HDL 17 (L) 05/22/2013 0526   CHOLHDL 3.1 10/08/2016 0507   VLDL 17 10/08/2016 0507   VLDL 32 05/22/2013 0526   LDLCALC 62 10/08/2016 0507   LDLCALC 61 05/22/2013 0526   July 01, 2017 Total cholesterol 109, triglycerides 78, HDL 34, LDL 59  ASSESSMENT:    1. Coronary artery disease involving native coronary artery of native heart without angina pectoris   2. History of arterial ischemic stroke   3. Hypercholesterolemia   4. Essential hypertension   5. Smoking      PLAN:  In order of problems listed above:  1. CAD: Asymptomatic, angina free despite being fairly active.  Smoking is his major unaddressed coronary risk factor. 2. Hx cryptogenic ischemic stroke: On antiplatelet therapy without recurrence.  3 years of implantable loop monitoring did not show atrial fibrillation. 3. HLP:  LDL was well within target range, HDL remains a little low. 4. HTN: Excellent blood pressure control 5. Smoking again.  Unlikely to quit unless his social situation improves    Medication Adjustments/Labs and  Tests Ordered: Current medicines are reviewed at length with the patient today.  Concerns regarding medicines are outlined above.  Medication changes, Labs and Tests ordered today are listed in the Patient Instructions below. Patient Instructions  Medication Instructions:  Dr Sallyanne Kuster recommends that you continue on your current medications as directed. Please refer to the Current Medication list given to you today.  If you need a refill on your cardiac medications before your next appointment, please call your pharmacy.   Follow-Up: At [facility], you and your health needs are our priority.  As part of our continuing mission to provide you with exceptional heart care, we have created designated Provider Care Teams.  These Care Teams include your primary Cardiologist (physician) and Advanced Practice Providers (APPs -  Physician Assistants and Nurse Practitioners) who all work together to provide you with the care you need, when you need it. You will need a follow up appointment in 12 months.  Please call our office 2 months in advance to schedule this appointment.  You may see Louis Klein, MD or one of the following Advanced Practice Providers on your designated Care Team: [place], [place] . Fabian Sharp, PA-C    Signed, Louis Klein, MD  12/01/2017 1:38 PM    [facility] [address], [place], [place]  [address] Phone: [phone]; Fax: [phone]

## 2017-12-24 DIAGNOSIS — M25531 Pain in right wrist: Secondary | ICD-10-CM | POA: Diagnosis not present

## 2017-12-24 DIAGNOSIS — M79641 Pain in right hand: Secondary | ICD-10-CM | POA: Diagnosis not present

## 2017-12-30 DIAGNOSIS — Z125 Encounter for screening for malignant neoplasm of prostate: Secondary | ICD-10-CM | POA: Diagnosis not present

## 2017-12-30 DIAGNOSIS — I25119 Atherosclerotic heart disease of native coronary artery with unspecified angina pectoris: Secondary | ICD-10-CM | POA: Diagnosis not present

## 2017-12-30 DIAGNOSIS — E119 Type 2 diabetes mellitus without complications: Secondary | ICD-10-CM | POA: Diagnosis not present

## 2017-12-30 DIAGNOSIS — J449 Chronic obstructive pulmonary disease, unspecified: Secondary | ICD-10-CM | POA: Diagnosis not present

## 2018-01-06 DIAGNOSIS — E119 Type 2 diabetes mellitus without complications: Secondary | ICD-10-CM | POA: Diagnosis not present

## 2018-01-06 DIAGNOSIS — Z23 Encounter for immunization: Secondary | ICD-10-CM | POA: Diagnosis not present

## 2018-01-06 DIAGNOSIS — I10 Essential (primary) hypertension: Secondary | ICD-10-CM | POA: Diagnosis not present

## 2018-01-06 DIAGNOSIS — J449 Chronic obstructive pulmonary disease, unspecified: Secondary | ICD-10-CM | POA: Diagnosis not present

## 2018-01-06 DIAGNOSIS — I25119 Atherosclerotic heart disease of native coronary artery with unspecified angina pectoris: Secondary | ICD-10-CM | POA: Diagnosis not present

## 2018-02-22 ENCOUNTER — Other Ambulatory Visit: Payer: Self-pay | Admitting: *Deleted

## 2018-02-22 MED ORDER — CLOPIDOGREL BISULFATE 75 MG PO TABS: 75.0000 mg | ORAL_TABLET | Freq: Every day | ORAL | 3 refills | Status: DC

## 2018-02-22 MED ORDER — RAMIPRIL 10 MG PO CAPS: 10.0000 mg | ORAL_CAPSULE | Freq: Every day | ORAL | 2 refills | Status: DC

## 2018-03-19 ENCOUNTER — Telehealth: Payer: Self-pay | Admitting: Cardiovascular Disease

## 2018-03-19 DIAGNOSIS — I10 Essential (primary) hypertension: Secondary | ICD-10-CM | POA: Diagnosis not present

## 2018-03-19 DIAGNOSIS — R42 Dizziness and giddiness: Secondary | ICD-10-CM | POA: Diagnosis not present

## 2018-03-19 DIAGNOSIS — E119 Type 2 diabetes mellitus without complications: Secondary | ICD-10-CM | POA: Diagnosis not present

## 2018-03-19 DIAGNOSIS — I25119 Atherosclerotic heart disease of native coronary artery with unspecified angina pectoris: Secondary | ICD-10-CM | POA: Diagnosis not present

## 2018-03-19 MED ORDER — ISOSORBIDE MONONITRATE ER 30 MG PO TB24: 30.0000 mg | ORAL_TABLET | Freq: Every day | ORAL | 3 refills | Status: DC

## 2018-03-19 NOTE — Telephone Encounter (Signed)
 *  STAT* If patient is at the pharmacy, call can be transferred to refill team.   1. Which medications need to be refilled? (please list name of each medication and dose if known) isosorbide mononitrate (IMDUR) 30 MG 24 hr tablet  2. Which pharmacy/location (including street and city if local pharmacy) is medication to be sent to? [facility]  3. Do they need a 30 day or 90 day supply? 90 day  Patient is completely out of medication

## 2018-03-19 NOTE — Telephone Encounter (Signed)
RX sent in for Imdur.

## 2018-04-02 ENCOUNTER — Other Ambulatory Visit: Payer: Self-pay | Admitting: Cardiovascular Disease

## 2018-04-02 MED ORDER — CLOPIDOGREL BISULFATE 75 MG PO TABS: 75.0000 mg | ORAL_TABLET | Freq: Every day | ORAL | 2 refills | Status: DC

## 2018-04-02 MED ORDER — RAMIPRIL 10 MG PO CAPS: 10.0000 mg | ORAL_CAPSULE | Freq: Every day | ORAL | 2 refills | Status: DC

## 2018-04-02 NOTE — Telephone Encounter (Signed)
°*  STAT* If patient is at the pharmacy, call can be transferred to refill team.   1. Which medications need to be refilled? (please list name of each medication and dose if known)  clopidogrel (PLAVIX) 75 MG tablet ramipril (ALTACE) 10 MG capsule  2. Which pharmacy/location (including street and city if local pharmacy) is medication to be sent to? [place], [place]   3. Do they need a 30 day or 90 day supply? 34  Pt is in the process of changing pharmacies, and the pharmacy lost these rx

## 2018-05-18 DIAGNOSIS — M7532 Calcific tendinitis of left shoulder: Secondary | ICD-10-CM | POA: Diagnosis not present

## 2018-05-18 DIAGNOSIS — M25522 Pain in left elbow: Secondary | ICD-10-CM | POA: Diagnosis not present

## 2018-05-18 DIAGNOSIS — M25512 Pain in left shoulder: Secondary | ICD-10-CM | POA: Diagnosis not present

## 2018-05-24 ENCOUNTER — Other Ambulatory Visit: Payer: Self-pay

## 2018-05-24 MED ORDER — PRAVASTATIN SODIUM 40 MG PO TABS: 40.0000 mg | ORAL_TABLET | Freq: Every day | ORAL | 8 refills | Status: DC

## 2018-07-08 DIAGNOSIS — E119 Type 2 diabetes mellitus without complications: Secondary | ICD-10-CM | POA: Diagnosis not present

## 2018-07-08 DIAGNOSIS — I10 Essential (primary) hypertension: Secondary | ICD-10-CM | POA: Diagnosis not present

## 2018-07-08 DIAGNOSIS — I25119 Atherosclerotic heart disease of native coronary artery with unspecified angina pectoris: Secondary | ICD-10-CM | POA: Diagnosis not present

## 2018-07-15 DIAGNOSIS — I25119 Atherosclerotic heart disease of native coronary artery with unspecified angina pectoris: Secondary | ICD-10-CM | POA: Diagnosis not present

## 2018-07-15 DIAGNOSIS — I10 Essential (primary) hypertension: Secondary | ICD-10-CM | POA: Diagnosis not present

## 2018-07-15 DIAGNOSIS — F325 Major depressive disorder, single episode, in full remission: Secondary | ICD-10-CM | POA: Diagnosis not present

## 2018-07-15 DIAGNOSIS — Z0000 Encounter for general adult medical examination without abnormal findings: Secondary | ICD-10-CM | POA: Diagnosis not present

## 2018-07-15 DIAGNOSIS — E119 Type 2 diabetes mellitus without complications: Secondary | ICD-10-CM | POA: Diagnosis not present

## 2018-07-15 DIAGNOSIS — J449 Chronic obstructive pulmonary disease, unspecified: Secondary | ICD-10-CM | POA: Diagnosis not present

## 2018-07-26 ENCOUNTER — Telehealth: Payer: Self-pay | Admitting: Cardiovascular Disease

## 2018-07-26 NOTE — Telephone Encounter (Signed)
Dental extraction is considered low risk procedure with local anesthesia, we do not typical recommend holding antiplatelet agent for 1-2 extraction. Since 3 teeth are being extracted, will clear with Dr. Sallyanne Kuster to see if need to hold antiplatelet agent. Last PCI was in 2012, reportedly had repeat cath in 2015 which showed stable anatomy.

## 2018-07-26 NOTE — Telephone Encounter (Signed)
New Message    Louis Gutierrez is calling from Dr Larwance Rote office and says the pt is needing to have some teeth removed. She said he is on a blood thinner and they need to know how long to hold the medication prior to the teeth removal and for how long after  She would like the information to be faxed   Monday-Thursday  Fax [phone]

## 2018-07-26 NOTE — Telephone Encounter (Signed)
   [facility] Pre-operative Risk Assessment    Request for surgical clearance:  1. What type of surgery is being performed? Dental extraction(s) - 3 total  2. When is this surgery scheduled? TBS   3. What type of clearance is required (medical clearance vs. Pharmacy clearance to hold med vs. Both)? Medical   4. Are there any medications that need to be held prior to surgery and how long? ASA & Plavix - how long to hold and when can he resume; please address if pre-medication is necessary   5. Practice name and name of physician performing surgery? Dr. Sandrea Hughs   6. What is your office phone number [phone]   7.   What is your office fax number [phone]  8.   Anesthesia type (None, local, MAC, general) ? Local anesthesia   Louis Gutierrez M 07/26/2018, 2:15 PM  _________________________________________________________________   (provider comments below)

## 2018-07-27 ENCOUNTER — Telehealth: Payer: Self-pay | Admitting: Cardiovascular Disease

## 2018-07-27 NOTE — Telephone Encounter (Signed)
   Primary Cardiologist: Sanda Klein, MD  Chart reviewed as part of pre-operative protocol coverage. Simple dental extractions are considered low risk procedures per guidelines and generally do not require any specific cardiac clearance. It is also generally accepted that for simple extractions and dental cleanings, there is no need to interrupt blood thinner therapy.   SBE prophylaxis is not required for the patient.  I will route this recommendation to the requesting party via Epic fax function and remove from pre-op pool.  Please call with questions. Per Dr. Sallyanne Kuster, ok to hold clopidogrel for 5 days before extraction. Ideally, we recommend continue aspirin during the procedure unless absolutely need to hold, in which case, a 5 day holding period is needed.   [place], [place] 07/27/2018, 8:51 AM

## 2018-07-27 NOTE — Telephone Encounter (Signed)
Spoke with Louis Gutierrez from Dr Willette Alma office and reread Dr Lurline Del note to her from yesterday. She verbalized understanding and stated she had the faxed note of recommendations in her hands currently. No further need.

## 2018-07-27 NOTE — Telephone Encounter (Signed)
Ok to hold clopidogrel for 5 days before extraction. Thanks

## 2018-07-27 NOTE — Telephone Encounter (Signed)
New Message    Helene Kelp from Dr. Willette Alma office wants to know if it's ok to premedicate patient before extractions.

## 2018-07-28 ENCOUNTER — Telehealth: Payer: Self-pay | Admitting: *Deleted

## 2018-07-28 NOTE — Telephone Encounter (Signed)
Patient has been notified that lung cancer screening CT scan is due currently or will be in near future. Confirmed that patient is within the appropriate age range, and asymptomatic, (no signs or symptoms of lung cancer). Patient denies illness that would prevent curative treatment for lung cancer if found. Verified smoking history (current smoker). Patient is agreeable for CT scan being scheduled.  He prefers appointment on Thursday or Friday anytime of the day, with the exception of Thursday July 16th.

## 2018-07-30 ENCOUNTER — Other Ambulatory Visit: Payer: Self-pay | Admitting: *Deleted

## 2018-07-30 DIAGNOSIS — Z122 Encounter for screening for malignant neoplasm of respiratory organs: Secondary | ICD-10-CM

## 2018-07-30 DIAGNOSIS — Z87891 Personal history of nicotine dependence: Secondary | ICD-10-CM

## 2018-08-06 ENCOUNTER — Ambulatory Visit: Admission: RE | Admit: 2018-08-06 | Payer: Medicare HMO | Source: Ambulatory Visit

## 2018-08-10 ENCOUNTER — Encounter: Payer: Self-pay | Admitting: *Deleted

## 2018-08-25 ENCOUNTER — Other Ambulatory Visit: Payer: Self-pay

## 2018-08-25 ENCOUNTER — Ambulatory Visit
Admission: RE | Admit: 2018-08-25 | Discharge: 2018-08-25 | Disposition: A | Discharge status: Home / Self Care | Payer: Medicare HMO | Source: Ambulatory Visit | Attending: Oncology | Admitting: Oncology

## 2018-08-25 DIAGNOSIS — D3502 Benign neoplasm of left adrenal gland: Secondary | ICD-10-CM | POA: Diagnosis not present

## 2018-08-25 DIAGNOSIS — Z122 Encounter for screening for malignant neoplasm of respiratory organs: Secondary | ICD-10-CM | POA: Diagnosis not present

## 2018-08-25 DIAGNOSIS — I10 Essential (primary) hypertension: Secondary | ICD-10-CM | POA: Diagnosis not present

## 2018-08-25 DIAGNOSIS — Z87891 Personal history of nicotine dependence: Secondary | ICD-10-CM | POA: Diagnosis not present

## 2018-08-25 DIAGNOSIS — F17210 Nicotine dependence, cigarettes, uncomplicated: Secondary | ICD-10-CM | POA: Diagnosis not present

## 2018-08-26 DIAGNOSIS — D3502 Benign neoplasm of left adrenal gland: Secondary | ICD-10-CM | POA: Diagnosis not present

## 2018-08-26 DIAGNOSIS — I10 Essential (primary) hypertension: Secondary | ICD-10-CM | POA: Diagnosis not present

## 2018-08-27 ENCOUNTER — Encounter: Payer: Self-pay | Admitting: *Deleted

## 2018-09-10 ENCOUNTER — Other Ambulatory Visit: Payer: Self-pay

## 2018-09-10 MED ORDER — NITROGLYCERIN 0.4 MG SL SUBL: 0.4000 mg | SUBLINGUAL_TABLET | SUBLINGUAL | 3 refills | Status: DC | PRN

## 2018-11-12 ENCOUNTER — Other Ambulatory Visit: Payer: Self-pay | Admitting: *Deleted

## 2018-11-12 MED ORDER — PRAVASTATIN SODIUM 40 MG PO TABS: 40.0000 mg | ORAL_TABLET | Freq: Every day | ORAL | 1 refills | Status: DC

## 2018-11-30 ENCOUNTER — Ambulatory Visit (INDEPENDENT_AMBULATORY_CARE_PROVIDER_SITE_OTHER): Payer: Medicare HMO | Admitting: Cardiovascular Disease

## 2018-11-30 ENCOUNTER — Encounter: Payer: Self-pay | Admitting: Cardiovascular Disease

## 2018-11-30 ENCOUNTER — Other Ambulatory Visit: Payer: Self-pay

## 2018-11-30 VITALS — BP 151/86 | HR 74 | Temp 97.3°F | Ht 71.0 in | Wt 220.0 lb

## 2018-11-30 DIAGNOSIS — E785 Hyperlipidemia, unspecified: Secondary | ICD-10-CM | POA: Diagnosis not present

## 2018-11-30 DIAGNOSIS — Z720 Tobacco use: Secondary | ICD-10-CM

## 2018-11-30 DIAGNOSIS — I2510 Atherosclerotic heart disease of native coronary artery without angina pectoris: Secondary | ICD-10-CM | POA: Diagnosis not present

## 2018-11-30 DIAGNOSIS — I639 Cerebral infarction, unspecified: Secondary | ICD-10-CM | POA: Diagnosis not present

## 2018-11-30 DIAGNOSIS — I10 Essential (primary) hypertension: Secondary | ICD-10-CM | POA: Diagnosis not present

## 2018-11-30 NOTE — Progress Notes (Signed)
Cardiology Office Note    Date:  12/01/2018   ID:  Louis Gutierrez, DOB 17-Sep-1950, MRN [id number]  PCP:  Kirk Ruths, MD  Cardiologist:   Sanda Klein, MD   Chief Complaint  Patient presents with  . Coronary Artery Disease    History of Present Illness:  Louis Gutierrez is a 68 y.o. male with coronary artery disease and a history of cryptogenic stroke.    He has few somatic complaints but remains under a lot of emotional stress.  Occasionally he gets chest pain when he gets very upset, but has not really needed nitroglycerin.  He does not have exertional angina.  Due to financial issues he had to move in with 2 of his daughters.  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  He smokes 3 packs of cigarettes a day.  He had a screening CT lung cancer which showed coronary artery calcifications (not surprising) and gallstones (asymptomatic).  Since this cryptogenic stroke, he has an implantable loop recorder that has reached end of service and is left in place.  During 3 years of monitoring that did not show atrial fibrillation.  He has not had any new neurological events.  His loop recorder has reached end of service and we cannot communicate with his device today.  Doing 3 years of monitoring it never showed atrial fibrillation.  He has not had any new stroke symptoms during this time.  The patient specifically denies any chest pain with exertion, dyspnea at rest or with exertion, orthopnea, paroxysmal nocturnal dyspnea, syncope, palpitations, focal neurological deficits, intermittent claudication, lower extremity edema, unexplained weight gain, cough, hemoptysis or wheezing.  He has gained back a little weight and is just over the BMI obesity limit.  He presented with ST segment elevation myocardial infarction in May of 2012. At that time had an inferior wall infarction, manifesting as unstable angina pectoris and eventually a syncopal episode. The  right coronary artery was totally occluded and revascularization was difficult but eventually he received a 3.5 x 24 mm Promus drug-eluting stent to the mid RCA. Also noted was 50% ostial stenosis of the first diagonal artery and 40% ostial stenosis of the second diagonal artery branches of the LAD, as well as total occlusion of a small left circumflex system. There was evidence of inferolateral hypokinesis and ejection fraction of 45-50% at that time. He presented with chest pain to [facility] in 2015 when he had a repeat coronary angiogram that was reportedly normal. I don't have that report. In May 2015 he presented with right hemiparesis and expressive aphasia with an infarction in the territory of the left middle cerebral artery. An etiology was not identified. He underwent loop recorder implantation in August 2015 and so far the device has not recorded any arrhythmia. His most recent echo performed in March 2015 showed left ventricular ejection fraction of 50-55 percent with abnormal relaxation and no regional wall motion abnormalities. Both atria were described as normal in size and there were no serious valvular abnormalities.   Past Medical History:  Diagnosis Date  . Asthma without status asthmaticus   . Atherosclerotic cerebrovascular disease    Overview:  Small and facial weakness with minimal weakness   . CAD (coronary artery disease)    a. 05/2010 Inf STEMI/PCI: RCA 143m (3.5x24 Promus DES), LCX small, 100, D1 50ost, D2 40ost, EF 45-50%; b. 03/2013 Cath ([facility]): Report not available - D/C summary says no  significant dzs.  . Cervical disc disease    a. s/p C3-4 fusion 2003.  Marland Kitchen Cervical myelopathy ([place])    a. 09/2016 Admit w/ left sided wkns->found to have progressive cervical disc dzs above and below prior fusioin site; b. 10/2016 s/p ant cervical diskectomy.  Marland Kitchen COPD (chronic obstructive pulmonary disease) ([place])   . Cryptogenic stroke ([place])    a. 05/2013 s/p acute L MCA  territory infarct; b. 07/2013 s/p MDT Linq ILR-->No AFib to documented to date (09/2016).  . Depression   . Essential (primary) hypertension   . H/O insomnia   . Inguinal hernia   . Ischemic cardiomyopathy    a. 05/2010 LV gram: EF 45-50% @ time of inf MI; b. 09/2016 Echo: EF 50-55%, inf HK, Gr1 DD, mild MR.  . Mixed hyperlipidemia   . Nephrolithiasis   . Obesity   . OSA (obstructive sleep apnea)   . Osteoarthritis   . STEMI (ST elevation myocardial infarction) ([place]) 06/07/2010  . Tobacco abuse   . Urinary incontinence   . Varicose veins     Past Surgical History:  Procedure Laterality Date  . ANTERIOR CERVICAL DECOMP/DISCECTOMY FUSION N/A 10/22/2016   Procedure: ANTERIOR CERVICAL DECOMPRESSION/DISCECTOMY FUSION 2 LEVELS C3-4, C6-7;  Surgeon: Meade Maw, MD;  Location: [facility] ORS;  Service: Neurosurgery;  Laterality: N/A;  . BACK SURGERY  2003   C3C4  . CARDIAC CATHETERIZATION  06/07/2010   50 % ostial narrowing in 1st diagonal from LAD, 2nd diagonal had 40% ostial narrowing; AV groove circumflex was diminutive and occluded proximally; RCA totally occluded in mid segment - stented with 3.5 x 66mm Promus Element DES  - See Media tab  . CARDIAC CATHETERIZATION  05/2013   [place]  . CERVICAL FUSION     c4/5/6  . COLONOSCOPY WITH PROPOFOL N/A 02/26/2015   Procedure: COLONOSCOPY WITH PROPOFOL;  Surgeon: Josefine Class, MD;  Location: [facility] ENDOSCOPY;  Service: Endoscopy;  Laterality: N/A;  . ESOPHAGOGASTRODUODENOSCOPY  02/26/2015   Procedure: ESOPHAGOGASTRODUODENOSCOPY (EGD);  Surgeon: Josefine Class, MD;  Location: [facility] ENDOSCOPY;  Service: Endoscopy;;  . LOOP RECORDER IMPLANT  08/16/13   Dr. Sallyanne Kuster    Current Medications: Outpatient Medications Prior to Visit  Medication Sig Dispense Refill  . acetaminophen (TYLENOL) 500 MG tablet Take 1,000 mg by mouth every 8 (eight) hours as needed for mild pain.     Marland Kitchen aspirin 81 MG chewable tablet Chew 81 mg daily by mouth.    .  clopidogrel (PLAVIX) 75 MG tablet Take 1 tablet (75 mg total) by mouth daily. 90 tablet 2  . COMBIVENT RESPIMAT 20-100 MCG/ACT AERS respimat Inhale 1 puff into the lungs See admin instructions. Takes 1 puff twice a day. Also uses it as a rescue inhaler 1 puff every 6 hours as needed.    . isosorbide mononitrate (IMDUR) 30 MG 24 hr tablet Take 1 tablet (30 mg total) by mouth daily. 90 tablet 3  . nitroGLYCERIN (NITROSTAT) 0.4 MG SL tablet Place 1 tablet (0.4 mg total) under the tongue every 5 (five) minutes as needed for chest pain. 30 tablet 3  . PARoxetine (PAXIL) 40 MG tablet Take 40 mg by mouth daily.     . pravastatin (PRAVACHOL) 40 MG tablet Take 1 tablet (40 mg total) by mouth daily. 30 tablet 1  . ramipril (ALTACE) 10 MG capsule Take 1 capsule (10 mg total) by mouth daily. 90 capsule 2  . traZODone (DESYREL) 100 MG tablet Take 50 mg by mouth at bedtime  as needed for sleep.      No facility-administered medications prior to visit.      Allergies:   Penicillins   Social History   Socioeconomic History  . Marital status: Married    Spouse name: Not on file  . Number of children: Not on file  . Years of education: Not on file  . Highest education level: Not on file  Occupational History  . 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  Social Needs  . Financial resource strain: Not on file  . Food insecurity    Worry: Not on file    Inability: Not on file  . Transportation needs    Medical: Not on file    Non-medical: Not on file  Tobacco Use  . Smoking status: Current Every Day Smoker    Packs/day: 1.00    Years: 53.00    Pack years: 53.00    Types: Cigarettes  . Smokeless tobacco: Never Used  . Tobacco comment: trying to cut back - currently smoking 0.5 ppd.  Substance and Sexual Activity  . Alcohol use: No  . Drug use: No  . Sexual activity: Not on file  Lifestyle  . Physical activity    Days per week: Not on file     Minutes per session: Not on file  . Stress: Not on file  Relationships  . Social Herbalist on phone: Not on file    Gets together: Not on file    Attends religious service: Not on file    Active member of club or organization: Not on file    Attends meetings of clubs or organizations: Not on file    Relationship status: Not on file  Other Topics Concern  . Not on file  Social History Narrative   Lives in [place] with wife, dtr, and 2 grandchildren.  Walks dog 30 mins 2x/day w/o difficulties.     Family History:  The patient's family history includes Cancer in his mother; Coronary artery disease in his brother; Diabetes in his brother; Stroke in his maternal grandmother.   ROS:   Please see the history of present illness.    ROS All other systems are reviewed and are negative.   PHYSICAL EXAM:   VS:  BP (!) 151/86   Pulse 74   Temp (!) 97.3 F (36.3 C)   Ht 5\' 11"  (1.803 m)   Wt 220 lb (99.8 kg)   SpO2 96%   BMI 30.68 kg/m      General: Alert, oriented x3, no distress, borderline obese Head: no evidence of trauma, PERRL, EOMI, no exophtalmos or lid lag, no myxedema, no xanthelasma; normal ears, nose and oropharynx Neck: normal jugular venous pulsations and no hepatojugular reflux; brisk carotid pulses without delay and no carotid bruits Chest: clear to auscultation, no signs of consolidation by percussion or palpation, normal fremitus, symmetrical and full respiratory excursions Cardiovascular: normal position and quality of the apical impulse, regular rhythm, normal first and second heart sounds, no murmurs, rubs or gallops Abdomen: no tenderness or distention, no masses by palpation, no abnormal pulsatility or arterial bruits, normal bowel sounds, no hepatosplenomegaly Extremities: no clubbing, cyanosis or edema; 2+ radial, ulnar and brachial pulses bilaterally; 2+ right femoral, posterior tibial and dorsalis pedis pulses; 2+ left femoral, posterior tibial and  dorsalis pedis pulses; no subclavian or femoral bruits Neurological: grossly nonfocal Psych: Normal mood and affect   Wt Readings from  Last 3 Encounters:  11/30/18 220 lb (99.8 kg)  08/25/18 219 lb (99.3 kg)  11/30/17 212 lb 3.2 oz (96.3 kg)      Studies/Labs Reviewed:   EKG:  EKG is ordered today.  It shows sinus rhythm and Q waves of an old inferior infarction, right bundle branch block, otherwise normal without acute ST-T changes.  QTC 446 ms Recent Labs: 07/08/2018 Total cholesterol 104, triglycerides 78, HDL 34, LDL 54 Hemoglobin A1c 6.6%  08/26/2018 Potassium 4.4, creatinine 1.0  August 31, 2017 Creatinine 0.9, BUN 13, potassium 4.4, aldosterone/renin ratio 3.1, cortisol 7.3 (ordered for evaluation of an adrenal adenoma, incidentally discovered during CT of the chest for lung cancer screening)  July 01, 2017  hemoglobin A1c 6.5%  Total cholesterol 109, triglycerides 78, HDL 34, LDL 59  Lipid Panel    Component Value Date/Time   CHOL 116 10/08/2016 0507   CHOL 110 05/22/2013 0526   TRIG 87 10/08/2016 0507   TRIG 162 05/22/2013 0526   HDL 37 (L) 10/08/2016 0507   HDL 17 (L) 05/22/2013 0526   CHOLHDL 3.1 10/08/2016 0507   VLDL 17 10/08/2016 0507   VLDL 32 05/22/2013 0526   LDLCALC 62 10/08/2016 0507   LDLCALC 61 05/22/2013 0526     ASSESSMENT:    1. Coronary artery disease involving native coronary artery of native heart without angina pectoris   2. Cryptogenic stroke ([place])   3. Dyslipidemia (high LDL; low HDL)   4. Essential hypertension   5. Tobacco abuse      PLAN:  In order of problems listed above:  1. CAD: During intense physical activity he does not have any symptoms.  He occasionally feels his chest pain when emotional distress.  On beta-blocker, aspirin, clopidogrel, statin, long-acting nitrates, ACE inhibitor. 2. Hx cryptogenic ischemic stroke: No recurrence.  On antiplatelet therapy with aspirin and clopidogrel.  3 years of implantable loop  monitoring did not show atrial fibrillation. 3. HLP: Excellent LDL, HDL remains a little low.  It will not improve without more physical activity and weight loss. 4. HTN: Usually very well controlled, he checks it occasionally at home and it is consistently in the 120-130/70 range.  No changes were made to his medications today. 5. Smoking heavily again.  I tried to impress on him how critical it is for him to quit smoking to avoid serious cardiovascular or lung problems.  He understands but feels that his emotional and social situation will not allow smoking cessation.   Medication Adjustments/Labs and Tests Ordered: Current medicines are reviewed at length with the patient today.  Concerns regarding medicines are outlined above.  Medication changes, Labs and Tests ordered today are listed in the Patient Instructions below. Patient Instructions  Medication Instructions:  No changes *If you need a refill on your cardiac medications before your next appointment, please call your pharmacy*  Lab Work: None ordered If you have labs (blood work) drawn today and your tests are completely normal, you will receive your results only by: Marland Kitchen MyChart Message (if you have MyChart) OR . A paper copy in the mail If you have any lab test that is abnormal or we need to change your treatment, we will call you to review the results.  Testing/Procedures: None ordered  Follow-Up: At [facility], you and your health needs are our priority.  As part of our continuing mission to provide you with exceptional heart care, we have created designated Provider Care Teams.  These Care Teams include your  primary Cardiologist (physician) and Advanced Practice Providers (APPs -  Physician Assistants and Nurse Practitioners) who all work together to provide you with the care you need, when you need it.  Your next appointment:   12 months  The format for your next appointment:   In Person  Provider:   Sanda Klein,  MD     Signed, Sanda Klein, MD  12/01/2018 5:18 PM    [facility] [address], [place], [place]  [address] Phone: [phone]; Fax: [phone]

## 2018-11-30 NOTE — Patient Instructions (Signed)

## 2018-12-01 ENCOUNTER — Encounter: Payer: Self-pay | Admitting: Cardiovascular Disease

## 2018-12-08 DIAGNOSIS — J40 Bronchitis, not specified as acute or chronic: Secondary | ICD-10-CM | POA: Diagnosis not present

## 2018-12-08 DIAGNOSIS — Z87891 Personal history of nicotine dependence: Secondary | ICD-10-CM | POA: Diagnosis not present

## 2018-12-08 DIAGNOSIS — Z8673 Personal history of transient ischemic attack (TIA), and cerebral infarction without residual deficits: Secondary | ICD-10-CM | POA: Diagnosis not present

## 2018-12-08 DIAGNOSIS — I25119 Atherosclerotic heart disease of native coronary artery with unspecified angina pectoris: Secondary | ICD-10-CM | POA: Diagnosis not present

## 2018-12-08 DIAGNOSIS — I10 Essential (primary) hypertension: Secondary | ICD-10-CM | POA: Diagnosis not present

## 2018-12-26 ENCOUNTER — Emergency Department
Admission: EM | Admit: 2018-12-26 | Discharge: 2018-12-26 | Disposition: A | Discharge status: Home / Self Care | Payer: Medicare HMO | Attending: Emergency Medicine | Admitting: Emergency Medicine

## 2018-12-26 ENCOUNTER — Emergency Department: Payer: Medicare HMO

## 2018-12-26 ENCOUNTER — Other Ambulatory Visit: Payer: Self-pay

## 2018-12-26 ENCOUNTER — Encounter: Payer: Self-pay | Admitting: Intensive Care

## 2018-12-26 DIAGNOSIS — Y9389 Activity, other specified: Secondary | ICD-10-CM | POA: Insufficient documentation

## 2018-12-26 DIAGNOSIS — Y9289 Other specified places as the place of occurrence of the external cause: Secondary | ICD-10-CM | POA: Insufficient documentation

## 2018-12-26 DIAGNOSIS — S199XXA Unspecified injury of neck, initial encounter: Secondary | ICD-10-CM | POA: Diagnosis not present

## 2018-12-26 DIAGNOSIS — I2510 Atherosclerotic heart disease of native coronary artery without angina pectoris: Secondary | ICD-10-CM | POA: Diagnosis not present

## 2018-12-26 DIAGNOSIS — I10 Essential (primary) hypertension: Secondary | ICD-10-CM | POA: Insufficient documentation

## 2018-12-26 DIAGNOSIS — S161XXA Strain of muscle, fascia and tendon at neck level, initial encounter: Secondary | ICD-10-CM | POA: Diagnosis not present

## 2018-12-26 DIAGNOSIS — R519 Headache, unspecified: Secondary | ICD-10-CM | POA: Insufficient documentation

## 2018-12-26 DIAGNOSIS — Z79899 Other long term (current) drug therapy: Secondary | ICD-10-CM | POA: Insufficient documentation

## 2018-12-26 DIAGNOSIS — S0990XA Unspecified injury of head, initial encounter: Secondary | ICD-10-CM | POA: Diagnosis not present

## 2018-12-26 DIAGNOSIS — Y998 Other external cause status: Secondary | ICD-10-CM | POA: Insufficient documentation

## 2018-12-26 DIAGNOSIS — X500XXA Overexertion from strenuous movement or load, initial encounter: Secondary | ICD-10-CM | POA: Insufficient documentation

## 2018-12-26 DIAGNOSIS — J449 Chronic obstructive pulmonary disease, unspecified: Secondary | ICD-10-CM | POA: Insufficient documentation

## 2018-12-26 DIAGNOSIS — Z7982 Long term (current) use of aspirin: Secondary | ICD-10-CM | POA: Insufficient documentation

## 2018-12-26 MED ORDER — LIDOCAINE 5 % EX PTCH
1.0000 | MEDICATED_PATCH | Freq: Once | CUTANEOUS | Status: DC
  Administered 2018-12-26: 1 via TRANSDERMAL
  Filled 2018-12-26: qty 1

## 2018-12-26 MED ORDER — TRAMADOL HCL 50 MG PO TABS: 50.0000 mg | ORAL_TABLET | Freq: Four times a day (QID) | ORAL | 0 refills | Status: DC | PRN

## 2018-12-26 MED ORDER — OXYCODONE-ACETAMINOPHEN 5-325 MG PO TABS
1.0000 | ORAL_TABLET | Freq: Once | ORAL | Status: AC
  Administered 2018-12-26: 1 via ORAL
  Filled 2018-12-26: qty 1

## 2018-12-26 MED ORDER — BACLOFEN 10 MG PO TABS: 10.0000 mg | ORAL_TABLET | Freq: Every day | ORAL | 1 refills | Status: AC

## 2018-12-26 MED ORDER — PREDNISONE 10 MG (21) PO TBPK: ORAL_TABLET | ORAL | 0 refills | Status: DC

## 2018-12-26 MED ORDER — CYCLOBENZAPRINE HCL 10 MG PO TABS
10.0000 mg | ORAL_TABLET | Freq: Once | ORAL | Status: AC
  Administered 2018-12-26: 10 mg via ORAL
  Filled 2018-12-26: qty 1

## 2018-12-26 MED ORDER — LIDOCAINE 5 % EX PTCH: 1.0000 | MEDICATED_PATCH | Freq: Two times a day (BID) | CUTANEOUS | 0 refills | Status: AC

## 2018-12-26 NOTE — Discharge Instructions (Addendum)
Follow-up with your regular doctor or neurosurgery if not improving in 5 to 7 days.  Use medications as prescribed.  Return emergency department worsening.  Try using the Lidoderm patch and muscle relaxer prior to using tramadol.  If pain is not reduced with those medications you can use tramadol for pain.  Be aware this is a medication that can cause addictive problems.

## 2018-12-26 NOTE — ED Provider Notes (Signed)
[facility] Emergency Department Provider Note  ____________________________________________   First MD Initiated Contact with Patient 12/26/18 1436     (approximate)  I have reviewed the triage vital signs and the nursing notes.   HISTORY  Chief Complaint Neck Pain    HPI Louis Gutierrez is a 68 y.o. male presents emergency department complaint of neck pain.  Patient had a near fall yesterday.  States he caught the edge of the toolbox to keep him from falling.  He has pain radiating up into his head and along his neck.  History of C-spine surgery.  Has a cage and screws on his neck.  He denies any numbness or tingling.  He denies any strokelike symptoms.  He denies fever or chills.    Past Medical History:  Diagnosis Date  . Asthma without status asthmaticus   . Atherosclerotic cerebrovascular disease    Overview:  Small and facial weakness with minimal weakness   . CAD (coronary artery disease)    a. 05/2010 Inf STEMI/PCI: RCA 167m (3.5x24 Promus DES), LCX small, 100, D1 50ost, D2 40ost, EF 45-50%; b. 03/2013 Cath ([facility]): Report not available - D/C summary says no significant dzs.  . Cervical disc disease    a. s/p C3-4 fusion 2003.  Marland Kitchen Cervical myelopathy ([place])    a. 09/2016 Admit w/ left sided wkns->found to have progressive cervical disc dzs above and below prior fusioin site; b. 10/2016 s/p ant cervical diskectomy.  Marland Kitchen COPD (chronic obstructive pulmonary disease) ([place])   . Cryptogenic stroke ([place])    a. 05/2013 s/p acute L MCA territory infarct; b. 07/2013 s/p MDT Linq ILR-->No AFib to documented to date (09/2016).  . Depression   . Essential (primary) hypertension   . H/O insomnia   . Inguinal hernia   . Ischemic cardiomyopathy    a. 05/2010 LV gram: EF 45-50% @ time of inf MI; b. 09/2016 Echo: EF 50-55%, inf HK, Gr1 DD, mild MR.  . Mixed hyperlipidemia   . Nephrolithiasis   . Obesity   . OSA (obstructive sleep apnea)   . Osteoarthritis   .  STEMI (ST elevation myocardial infarction) ([place]) 06/07/2010  . Tobacco abuse   . Urinary incontinence   . Varicose veins     Patient Active Problem List   Diagnosis Date Noted  . Cervical myelopathy ([place]) 10/22/2016  . Spinal stenosis 10/10/2016  . Left inguinal hernia 02/05/2016  . History of arterial ischemic stroke 01/31/2016  . Left Adrenal cortical adenoma 10/16/2015  . Nephrolithiasis 10/16/2015  . Prostate cancer screening 10/16/2015  . Renal cysts, congenital, bilateral 10/16/2015  . Microscopic hematuria 10/16/2015  . 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 Cervical pain 12/08/2014  . Atherosclerotic cerebrovascular disease 06/09/2014  . Chronic obstructive pulmonary disease ([place]) 06/09/2014  . CAD in native artery 06/09/2014  . Essential (primary) hypertension 06/09/2014  . Blood glucose elevated 06/09/2014  . Major depression in remission ([place]) 06/09/2014  . 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 Chondrocalcinosis of knee 04/20/2014  . Primary osteoarthritis of both knees 04/20/2014  . Status post placement of implantable loop recorder 08/24/2013  . Stroke of unknown cause ([place]) 08/06/2013  . Tobacco abuse 06/30/2012  . COPD (chronic obstructive pulmonary disease) ([place]) 06/30/2012  . OSA (obstructive sleep apnea)   . CAD (coronary artery disease)   . Mixed hyperlipidemia   . Obesity   . Hypertension 06/10/2010  . STEMI (ST elevation myocardial infarction) ([place])  06/07/2010    Past Surgical History:  Procedure Laterality Date  . ANTERIOR CERVICAL DECOMP/DISCECTOMY FUSION N/A 10/22/2016   Procedure: ANTERIOR CERVICAL DECOMPRESSION/DISCECTOMY FUSION 2 LEVELS C3-4, C6-7;  Surgeon: Meade Maw, MD;  Location: [facility] ORS;  Service: Neurosurgery;  Laterality: N/A;  . BACK SURGERY  2003   C3C4  . CARDIAC CATHETERIZATION  06/07/2010   50 % ostial narrowing in 1st diagonal from LAD, 2nd diagonal had 40% ostial narrowing; AV  groove circumflex was diminutive and occluded proximally; RCA totally occluded in mid segment - stented with 3.5 x 40mm Promus Element DES  - See Media tab  . CARDIAC CATHETERIZATION  05/2013   [place]  . CERVICAL FUSION     c4/5/6  . COLONOSCOPY WITH PROPOFOL N/A 02/26/2015   Procedure: COLONOSCOPY WITH PROPOFOL;  Surgeon: Josefine Class, MD;  Location: [facility] ENDOSCOPY;  Service: Endoscopy;  Laterality: N/A;  . ESOPHAGOGASTRODUODENOSCOPY  02/26/2015   Procedure: ESOPHAGOGASTRODUODENOSCOPY (EGD);  Surgeon: Josefine Class, MD;  Location: [facility] ENDOSCOPY;  Service: Endoscopy;;  . LOOP RECORDER IMPLANT  08/16/13   Dr. Sallyanne Kuster    Prior to Admission medications   Medication Sig Start Date End Date Taking? Authorizing Provider  acetaminophen (TYLENOL) 500 MG tablet Take 1,000 mg by mouth every 8 (eight) hours as needed for mild pain.     [provider]  aspirin 81 MG chewable tablet Chew 81 mg daily by mouth.    [provider]  baclofen (LIORESAL) 10 MG tablet Take 1 tablet (10 mg total) by mouth daily. 12/26/18 12/26/19  Versie Starks, PA-C  clopidogrel (PLAVIX) 75 MG tablet Take 1 tablet (75 mg total) by mouth daily. 04/02/18   Croitoru, Mihai, MD  COMBIVENT RESPIMAT 20-100 MCG/ACT AERS respimat Inhale 1 puff into the lungs See admin instructions. Takes 1 puff twice a day. Also uses it as a rescue inhaler 1 puff every 6 hours as needed. 01/02/16   [provider]  isosorbide mononitrate (IMDUR) 30 MG 24 hr tablet Take 1 tablet (30 mg total) by mouth daily. 03/19/18   Croitoru, Mihai, MD  lidocaine (LIDODERM) 5 % Place 1 patch onto the skin every 12 (twelve) hours. Remove & Discard patch within 12 hours or as directed by MD 12/26/18 12/26/19  Caryn Section, Linden Dolin, PA-C  nitroGLYCERIN (NITROSTAT) 0.4 MG SL tablet Place 1 tablet (0.4 mg total) under the tongue every 5 (five) minutes as needed for chest pain. 09/10/18   Croitoru, Mihai, MD  PARoxetine (PAXIL) 40 MG tablet Take  40 mg by mouth daily.  11/16/13   [provider]  pravastatin (PRAVACHOL) 40 MG tablet Take 1 tablet (40 mg total) by mouth daily. 11/12/18   Croitoru, Mihai, MD  predniSONE (STERAPRED UNI-PAK 21 TAB) 10 MG (21) TBPK tablet Take 6 pills on day one then decrease by 1 pill each day 12/26/18   Versie Starks, PA-C  ramipril (ALTACE) 10 MG capsule Take 1 capsule (10 mg total) by mouth daily. 04/02/18   Croitoru, Mihai, MD  traMADol (ULTRAM) 50 MG tablet Take 1 tablet (50 mg total) by mouth every 6 (six) hours as needed. 12/26/18   Fisher, Linden Dolin, PA-C  traZODone (DESYREL) 100 MG tablet Take 50 mg by mouth at bedtime as needed for sleep.     [provider]    Allergies Penicillins  Family History  Problem Relation Age of Onset  . Cancer Mother        oral  . Stroke Maternal Grandmother   .  Diabetes Brother   . Coronary artery disease Brother     Social History Social History   Tobacco Use  . Smoking status: Current Every Day Smoker    Packs/day: 1.00    Years: 53.00    Pack years: 53.00    Types: Cigarettes  . Smokeless tobacco: Never Used  . Tobacco comment: trying to cut back - currently smoking 0.5 ppd.  Substance Use Topics  . Alcohol use: No  . Drug use: No    Review of Systems  Constitutional: No fever/chills Eyes: No visual changes. ENT: No sore throat. Respiratory: Denies cough Genitourinary: Negative for dysuria. Musculoskeletal: Negative for back pain.  Positive for neck pain Skin: Negative for rash.    ____________________________________________   PHYSICAL EXAM:  VITAL SIGNS: ED Triage Vitals  Enc Vitals Group     BP 12/26/18 1423 115/65     Pulse Rate 12/26/18 1423 86     Resp 12/26/18 1423 16     Temp 12/26/18 1423 98.7 F (37.1 C)     Temp Source 12/26/18 1423 Oral     SpO2 12/26/18 1423 97 %     Weight 12/26/18 1406 213 lb (96.6 kg)     Height 12/26/18 1406 5\' 11"  (1.803 m)     Head Circumference --      Peak Flow --       Pain Score 12/26/18 1406 7     Pain Loc --      Pain Edu? --      Excl. in [place]? --     Constitutional: Alert and oriented. Well appearing and in no acute distress. Eyes: Conjunctivae are normal.  Head: Atraumatic. Nose: No congestion/rhinnorhea. Mouth/Throat: Mucous membranes are moist.   Neck:  supple no lymphadenopathy noted Cardiovascular: Normal rate, regular rhythm. Heart sounds are normal Respiratory: Normal respiratory effort.  No retractions, lungs c t a  GU: deferred Musculoskeletal: FROM all extremities, warm and well perfused, C-spine is tender to palpation, right-sided trapezius muscle is tender to palpation, neurovascular is intact, grips are equal bilaterally Neurologic:  Normal speech and language.  Skin:  Skin is warm, dry and intact. No rash noted. Psychiatric: Mood and affect are normal. Speech and behavior are normal.  ____________________________________________   LABS (all labs ordered are listed, but only abnormal results are displayed)  Labs Reviewed - No data to display ____________________________________________   ____________________________________________  RADIOLOGY  CT of the C-spine and CT of the head are both negative for any acute abnormalities  ____________________________________________   PROCEDURES  Procedure(s) performed: Percocet, Flexeril p.o., Lidoderm patch   Procedures    ____________________________________________   INITIAL IMPRESSION / ASSESSMENT AND PLAN / ED COURSE  Pertinent labs & imaging results that were available during my care of the patient were reviewed by me and considered in my medical decision making (see chart for details).   Patient 68 year old male presents emergency department with concerns of neck pain.  See HPI  Physical exam shows patient to appear well.  C-spine is tender.  Trapezius muscles tender.  Remainder exam is unremarkable  Due to the previous surgeries along with the headache CT of the  head and C-spine were ordered.  Both are negative for any acute abnormalities.  Explained the findings to the patient.  He was given Percocet, Flexeril, and a Lidoderm patch while here in the ED.  I sent a prescription for Sterapred, baclofen, tramadol, and Lidoderm patches to his pharmacy.  He is to follow-up with either his  regular doctor or neurosurgery at [facility] clinic if not improving in 5 to 7 days.  He states he understands and will comply.  He was discharged in stable condition.    Louis Gutierrez was evaluated in Emergency Department on 12/26/2018 for the symptoms described in the history of present illness. He was evaluated in the context of the global COVID-19 pandemic, which necessitated consideration that the patient might be at risk for infection with the SARS-CoV-2 virus that causes COVID-19. Institutional protocols and algorithms that pertain to the evaluation of patients at risk for COVID-19 are in a state of rapid change based on information released by regulatory bodies including the CDC and federal and state organizations. These policies and algorithms were followed during the patient's care in the ED.   As part of my medical decision making, I reviewed the following data within the [facility] notes reviewed and incorporated, Old chart reviewed, Radiograph reviewed , Notes from prior ED visits and [place] Controlled Substance Database  ____________________________________________   FINAL CLINICAL IMPRESSION(S) / ED DIAGNOSES  Final diagnoses:  Acute strain of neck muscle, initial encounter      NEW MEDICATIONS STARTED DURING THIS VISIT:  New Prescriptions   BACLOFEN (LIORESAL) 10 MG TABLET    Take 1 tablet (10 mg total) by mouth daily.   LIDOCAINE (LIDODERM) 5 %    Place 1 patch onto the skin every 12 (twelve) hours. Remove & Discard patch within 12 hours or as directed by MD   PREDNISONE (STERAPRED UNI-PAK 21 TAB) 10 MG (21) TBPK TABLET    Take 6  pills on day one then decrease by 1 pill each day   TRAMADOL (ULTRAM) 50 MG TABLET    Take 1 tablet (50 mg total) by mouth every 6 (six) hours as needed.     Note:  This document was prepared using Dragon voice recognition software and may include unintentional dictation errors.    Versie Starks, PA-C 12/26/18 [place]    Arta Silence, MD 12/26/18 [phone]

## 2018-12-26 NOTE — ED Triage Notes (Signed)
Patient reports catching himself from falling the day before yesterday and twisting his neck the wrong way. HX neck surgery. Reports C4-C7 inclusive.

## 2018-12-26 NOTE — ED Notes (Signed)
Pt verbalized understanding of discharge instructions. NAD at this time. 

## 2019-01-11 DIAGNOSIS — I25119 Atherosclerotic heart disease of native coronary artery with unspecified angina pectoris: Secondary | ICD-10-CM | POA: Diagnosis not present

## 2019-01-11 DIAGNOSIS — Z87891 Personal history of nicotine dependence: Secondary | ICD-10-CM | POA: Diagnosis not present

## 2019-01-11 DIAGNOSIS — J449 Chronic obstructive pulmonary disease, unspecified: Secondary | ICD-10-CM | POA: Diagnosis not present

## 2019-01-11 DIAGNOSIS — E119 Type 2 diabetes mellitus without complications: Secondary | ICD-10-CM | POA: Diagnosis not present

## 2019-01-11 DIAGNOSIS — F325 Major depressive disorder, single episode, in full remission: Secondary | ICD-10-CM | POA: Diagnosis not present

## 2019-01-11 DIAGNOSIS — I10 Essential (primary) hypertension: Secondary | ICD-10-CM | POA: Diagnosis not present

## 2019-02-03 DIAGNOSIS — H52223 Regular astigmatism, bilateral: Secondary | ICD-10-CM | POA: Diagnosis not present

## 2019-02-07 ENCOUNTER — Other Ambulatory Visit: Payer: Self-pay

## 2019-02-07 MED ORDER — ISOSORBIDE MONONITRATE ER 30 MG PO TB24: 30.0000 mg | ORAL_TABLET | Freq: Every day | ORAL | 2 refills | Status: DC

## 2019-02-15 DIAGNOSIS — H18413 Arcus senilis, bilateral: Secondary | ICD-10-CM | POA: Diagnosis not present

## 2019-02-15 DIAGNOSIS — H2513 Age-related nuclear cataract, bilateral: Secondary | ICD-10-CM | POA: Diagnosis not present

## 2019-02-15 DIAGNOSIS — H25013 Cortical age-related cataract, bilateral: Secondary | ICD-10-CM | POA: Diagnosis not present

## 2019-02-15 DIAGNOSIS — H25043 Posterior subcapsular polar age-related cataract, bilateral: Secondary | ICD-10-CM | POA: Diagnosis not present

## 2019-02-15 DIAGNOSIS — H2512 Age-related nuclear cataract, left eye: Secondary | ICD-10-CM | POA: Diagnosis not present

## 2019-02-25 DIAGNOSIS — H2512 Age-related nuclear cataract, left eye: Secondary | ICD-10-CM | POA: Diagnosis not present

## 2019-02-28 DIAGNOSIS — H2512 Age-related nuclear cataract, left eye: Secondary | ICD-10-CM | POA: Diagnosis not present

## 2019-03-01 DIAGNOSIS — H2511 Age-related nuclear cataract, right eye: Secondary | ICD-10-CM | POA: Diagnosis not present

## 2019-04-04 DIAGNOSIS — H2511 Age-related nuclear cataract, right eye: Secondary | ICD-10-CM | POA: Diagnosis not present

## 2019-05-06 ENCOUNTER — Other Ambulatory Visit: Payer: Self-pay

## 2019-05-06 MED ORDER — RAMIPRIL 10 MG PO CAPS: 10.0000 mg | ORAL_CAPSULE | Freq: Every day | ORAL | 1 refills | Status: DC

## 2019-05-18 ENCOUNTER — Other Ambulatory Visit: Payer: Self-pay

## 2019-05-18 ENCOUNTER — Emergency Department: Payer: Medicare HMO

## 2019-05-18 ENCOUNTER — Emergency Department
Admission: EM | Admit: 2019-05-18 | Discharge: 2019-05-18 | Disposition: A | Discharge status: Home / Self Care | Payer: Medicare HMO | Attending: Emergency Medicine | Admitting: Emergency Medicine

## 2019-05-18 ENCOUNTER — Encounter: Payer: Self-pay | Admitting: Emergency Medicine

## 2019-05-18 DIAGNOSIS — Y999 Unspecified external cause status: Secondary | ICD-10-CM | POA: Insufficient documentation

## 2019-05-18 DIAGNOSIS — W11XXXA Fall on and from ladder, initial encounter: Secondary | ICD-10-CM | POA: Diagnosis not present

## 2019-05-18 DIAGNOSIS — I10 Essential (primary) hypertension: Secondary | ICD-10-CM | POA: Diagnosis not present

## 2019-05-18 DIAGNOSIS — J449 Chronic obstructive pulmonary disease, unspecified: Secondary | ICD-10-CM | POA: Diagnosis not present

## 2019-05-18 DIAGNOSIS — S20211A Contusion of right front wall of thorax, initial encounter: Secondary | ICD-10-CM | POA: Insufficient documentation

## 2019-05-18 DIAGNOSIS — R1032 Left lower quadrant pain: Secondary | ICD-10-CM | POA: Insufficient documentation

## 2019-05-18 DIAGNOSIS — I119 Hypertensive heart disease without heart failure: Secondary | ICD-10-CM | POA: Diagnosis not present

## 2019-05-18 DIAGNOSIS — R0789 Other chest pain: Secondary | ICD-10-CM | POA: Diagnosis not present

## 2019-05-18 DIAGNOSIS — F17210 Nicotine dependence, cigarettes, uncomplicated: Secondary | ICD-10-CM | POA: Insufficient documentation

## 2019-05-18 DIAGNOSIS — Y9389 Activity, other specified: Secondary | ICD-10-CM | POA: Insufficient documentation

## 2019-05-18 DIAGNOSIS — Y929 Unspecified place or not applicable: Secondary | ICD-10-CM | POA: Diagnosis not present

## 2019-05-18 DIAGNOSIS — S299XXA Unspecified injury of thorax, initial encounter: Secondary | ICD-10-CM | POA: Diagnosis not present

## 2019-05-18 DIAGNOSIS — I2510 Atherosclerotic heart disease of native coronary artery without angina pectoris: Secondary | ICD-10-CM | POA: Insufficient documentation

## 2019-05-18 DIAGNOSIS — Z79899 Other long term (current) drug therapy: Secondary | ICD-10-CM | POA: Diagnosis not present

## 2019-05-18 LAB — CBC
HCT: 35.1 % — ABNORMAL LOW (ref 39.0–52.0)
Hemoglobin: 10 g/dL — ABNORMAL LOW (ref 13.0–17.0)
MCH: 20.7 pg — ABNORMAL LOW (ref 26.0–34.0)
MCHC: 28.5 g/dL — ABNORMAL LOW (ref 30.0–36.0)
MCV: 72.8 fL — ABNORMAL LOW (ref 80.0–100.0)
Platelets: 324 10*3/uL (ref 150–400)
RBC: 4.82 MIL/uL (ref 4.22–5.81)
RDW: 17.1 % — ABNORMAL HIGH (ref 11.5–15.5)
WBC: 7.1 10*3/uL (ref 4.0–10.5)
nRBC: 0 % (ref 0.0–0.2)

## 2019-05-18 LAB — BASIC METABOLIC PANEL
Anion gap: 8 (ref 5–15)
BUN: 15 mg/dL (ref 8–23)
CO2: 26 mmol/L (ref 22–32)
Calcium: 9.3 mg/dL (ref 8.9–10.3)
Chloride: 105 mmol/L (ref 98–111)
Creatinine, Ser: 0.89 mg/dL (ref 0.61–1.24)
GFR calc Af Amer: >60 mL/min (ref >60)
GFR calc non Af Amer: >60 mL/min (ref >60)
Glucose, Bld: 127 mg/dL — ABNORMAL HIGH (ref 70–99)
Potassium: 4.6 mmol/L (ref 3.5–5.1)
Sodium: 139 mmol/L (ref 135–145)

## 2019-05-18 LAB — TROPONIN I (HIGH SENSITIVITY)
Troponin I (High Sensitivity): 9 ng/L (ref <18)
Troponin I (High Sensitivity): 9 ng/L (ref <18)

## 2019-05-18 MED ORDER — OXYCODONE HCL 5 MG PO TABS: 5.0000 mg | ORAL_TABLET | Freq: Three times a day (TID) | ORAL | 0 refills | Status: AC | PRN

## 2019-05-18 MED ORDER — LIDOCAINE 5 % EX PTCH
1.0000 | MEDICATED_PATCH | CUTANEOUS | Status: DC
  Administered 2019-05-18: 14:00:00 1 via TRANSDERMAL
  Filled 2019-05-18: qty 1

## 2019-05-18 MED ORDER — OXYCODONE HCL 5 MG PO TABS
5.0000 mg | ORAL_TABLET | Freq: Once | ORAL | Status: AC
  Administered 2019-05-18: 14:00:00 5 mg via ORAL
  Filled 2019-05-18: qty 1

## 2019-05-18 MED ORDER — ACETAMINOPHEN 500 MG PO TABS
1000.0000 mg | ORAL_TABLET | Freq: Once | ORAL | Status: AC
  Administered 2019-05-18: 1000 mg via ORAL
  Filled 2019-05-18: qty 2

## 2019-05-18 NOTE — ED Notes (Signed)
Pt states he fell on Saturday off a 7 ft off a latter while doing work at his house. Pt states he thinks he lost consciousness and states today he had worsening shob and pain in the right side of his chest/rib  Area.

## 2019-05-18 NOTE — Discharge Instructions (Addendum)
We are treating you for possible rib fractures.  Take Tylenol 1 g every 8 hours.  You can use the patches that you have at home.  Take the oxycodone for breakthrough pain.  Do not drive or work while on these.  Your x-ray is as below.  There is concern for possible no pneumonia versus atelectasis but you had no symptoms of pneumonia and so we have held off on antibiotics at this time.  You should return the ER if you develop worsening shortness of breath, fevers, cough.  Use your incentive spirometry to try to keep your lungs open to prevent infection.   No evident rib fracture. Underlying emphysematous change with areas of scarring. Question superimposed atelectasis versus pneumonia left lower lobe. Stable cardiac silhouette. Loop recorder on left.

## 2019-05-18 NOTE — ED Provider Notes (Signed)
[facility] Emergency Department Provider Note  ____________________________________________   None    (approximate)  I have reviewed the triage vital signs and the nursing notes.   HISTORY  Chief Complaint Fall, Flank Pain, and Abdominal Pain    HPI Louis Gutierrez is a 69 y.o. male who is on Plavix who comes in for a fall.  Patient fell from a ladder on Saturday, 4 days ago.  Patient states that he was coming down the water when it opened up and fell down.  Thinks he fell about 7 feet.  Landed onto his right chest wall.  Patient stating that he is having pain on the right side of his chest.  Initially had a little bit of lower abdominal pain but not significantly tender at this time.  Patient states that the pain in his chest is moderate, constant, worse with movement or with breathing.  Denies any fevers, cough, concerns for pneumonia.  Patient is on Plavix.  Did have brief LOC but denies any headache, nausea, vomiting and feels at his baseline self at this time.          Past Medical History:  Diagnosis Date  . Asthma without status asthmaticus   . Atherosclerotic cerebrovascular disease    Overview:  Small and facial weakness with minimal weakness   . CAD (coronary artery disease)    a. 05/2010 Inf STEMI/PCI: RCA 13m (3.5x24 Promus DES), LCX small, 100, D1 50ost, D2 40ost, EF 45-50%; b. 03/2013 Cath ([facility]): Report not available - D/C summary says no significant dzs.  . Cervical disc disease    a. s/p C3-4 fusion 2003.  Marland Kitchen Cervical myelopathy ([place])    a. 09/2016 Admit w/ left sided wkns->found to have progressive cervical disc dzs above and below prior fusioin site; b. 10/2016 s/p ant cervical diskectomy.  Marland Kitchen COPD (chronic obstructive pulmonary disease) ([place])   . Cryptogenic stroke ([place])    a. 05/2013 s/p acute L MCA territory infarct; b. 07/2013 s/p MDT Linq ILR-->No AFib to documented to date (09/2016).  . Depression   . Essential (primary)  hypertension   . H/O insomnia   . Inguinal hernia   . Ischemic cardiomyopathy    a. 05/2010 LV gram: EF 45-50% @ time of inf MI; b. 09/2016 Echo: EF 50-55%, inf HK, Gr1 DD, mild MR.  . Mixed hyperlipidemia   . Nephrolithiasis   . Obesity   . OSA (obstructive sleep apnea)   . Osteoarthritis   . STEMI (ST elevation myocardial infarction) ([place]) 06/07/2010  . Tobacco abuse   . Urinary incontinence   . Varicose veins     Patient Active Problem List   Diagnosis Date Noted  . Cervical myelopathy ([place]) 10/22/2016  . Spinal stenosis 10/10/2016  . Left inguinal hernia 02/05/2016  . History of arterial ischemic stroke 01/31/2016  . Left Adrenal cortical adenoma 10/16/2015  . Nephrolithiasis 10/16/2015  . Prostate cancer screening 10/16/2015  . Renal cysts, congenital, bilateral 10/16/2015  . Microscopic hematuria 10/16/2015  . 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 Cervical pain 12/08/2014  . Atherosclerotic cerebrovascular disease 06/09/2014  . Chronic obstructive pulmonary disease ([place]) 06/09/2014  . CAD in native artery 06/09/2014  . Essential (primary) hypertension 06/09/2014  . Blood glucose elevated 06/09/2014  . Major depression in remission ([place]) 06/09/2014  . 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 Chondrocalcinosis of knee 04/20/2014  . Primary osteoarthritis of both knees 04/20/2014  . Status post  placement of implantable loop recorder 08/24/2013  . Stroke of unknown cause ([place]) 08/06/2013  . Tobacco abuse 06/30/2012  . COPD (chronic obstructive pulmonary disease) ([place]) 06/30/2012  . OSA (obstructive sleep apnea)   . CAD (coronary artery disease)   . Mixed hyperlipidemia   . Obesity   . Hypertension 06/10/2010  . STEMI (ST elevation myocardial infarction) ([place]) 06/07/2010    Past Surgical History:  Procedure Laterality Date  . ANTERIOR CERVICAL DECOMP/DISCECTOMY FUSION N/A 10/22/2016   Procedure: ANTERIOR CERVICAL  DECOMPRESSION/DISCECTOMY FUSION 2 LEVELS C3-4, C6-7;  Surgeon: Meade Maw, MD;  Location: [facility] ORS;  Service: Neurosurgery;  Laterality: N/A;  . BACK SURGERY  2003   C3C4  . CARDIAC CATHETERIZATION  06/07/2010   50 % ostial narrowing in 1st diagonal from LAD, 2nd diagonal had 40% ostial narrowing; AV groove circumflex was diminutive and occluded proximally; RCA totally occluded in mid segment - stented with 3.5 x 46mm Promus Element DES  - See Media tab  . CARDIAC CATHETERIZATION  05/2013   [place]  . CERVICAL FUSION     c4/5/6  . COLONOSCOPY WITH PROPOFOL N/A 02/26/2015   Procedure: COLONOSCOPY WITH PROPOFOL;  Surgeon: Josefine Class, MD;  Location: [facility] ENDOSCOPY;  Service: Endoscopy;  Laterality: N/A;  . ESOPHAGOGASTRODUODENOSCOPY  02/26/2015   Procedure: ESOPHAGOGASTRODUODENOSCOPY (EGD);  Surgeon: Josefine Class, MD;  Location: [facility] ENDOSCOPY;  Service: Endoscopy;;  . LOOP RECORDER IMPLANT  08/16/13   Dr. Sallyanne Kuster    Prior to Admission medications   Medication Sig Start Date End Date Taking? Authorizing Provider  acetaminophen (TYLENOL) 500 MG tablet Take 1,000 mg by mouth every 8 (eight) hours as needed for mild pain.     [provider]  aspirin 81 MG chewable tablet Chew 81 mg daily by mouth.    [provider]  baclofen (LIORESAL) 10 MG tablet Take 1 tablet (10 mg total) by mouth daily. 12/26/18 12/26/19  Versie Starks, PA-C  clopidogrel (PLAVIX) 75 MG tablet Take 1 tablet (75 mg total) by mouth daily. 04/02/18   Croitoru, Mihai, MD  COMBIVENT RESPIMAT 20-100 MCG/ACT AERS respimat Inhale 1 puff into the lungs See admin instructions. Takes 1 puff twice a day. Also uses it as a rescue inhaler 1 puff every 6 hours as needed. 01/02/16   [provider]  isosorbide mononitrate (IMDUR) 30 MG 24 hr tablet Take 1 tablet (30 mg total) by mouth daily. 02/07/19   Croitoru, Mihai, MD  lidocaine (LIDODERM) 5 % Place 1 patch onto the skin every 12 (twelve)  hours. Remove & Discard patch within 12 hours or as directed by MD 12/26/18 12/26/19  Caryn Section, Linden Dolin, PA-C  nitroGLYCERIN (NITROSTAT) 0.4 MG SL tablet Place 1 tablet (0.4 mg total) under the tongue every 5 (five) minutes as needed for chest pain. 09/10/18   Croitoru, Mihai, MD  PARoxetine (PAXIL) 40 MG tablet Take 40 mg by mouth daily.  11/16/13   [provider]  pravastatin (PRAVACHOL) 40 MG tablet Take 1 tablet (40 mg total) by mouth daily. 11/12/18   Croitoru, Mihai, MD  predniSONE (STERAPRED UNI-PAK 21 TAB) 10 MG (21) TBPK tablet Take 6 pills on day one then decrease by 1 pill each day 12/26/18   Versie Starks, PA-C  ramipril (ALTACE) 10 MG capsule Take 1 capsule (10 mg total) by mouth daily. 05/06/19   Croitoru, Mihai, MD  traMADol (ULTRAM) 50 MG tablet Take 1 tablet (50 mg total) by mouth every 6 (six) hours as needed.  12/26/18   Fisher, Linden Dolin, PA-C  traZODone (DESYREL) 100 MG tablet Take 50 mg by mouth at bedtime as needed for sleep.     [provider]    Allergies Penicillins  Family History  Problem Relation Age of Onset  . Cancer Mother        oral  . Stroke Maternal Grandmother   . Diabetes Brother   . Coronary artery disease Brother     Social History Social History   Tobacco Use  . Smoking status: Current Every Day Smoker    Packs/day: 1.00    Years: 53.00    Pack years: 53.00    Types: Cigarettes  . Smokeless tobacco: Never Used  . Tobacco comment: trying to cut back - currently smoking 0.5 ppd.  Substance Use Topics  . Alcohol use: No  . Drug use: No      Review of Systems Constitutional: No fever/chills Eyes: No visual changes. ENT: No sore throat. Cardiovascular: Positive right-sided chest wall pain. Respiratory: Denies shortness of breath. Gastrointestinal: Mild left lower abdominal pain now resolving.  No nausea, no vomiting.  No diarrhea.  No constipation. Genitourinary: Negative for dysuria. Musculoskeletal: Negative for back  pain. Skin: Negative for rash. Neurological: Negative for headaches, focal weakness or numbness. All other ROS negative ____________________________________________   PHYSICAL EXAM:  VITAL SIGNS: ED Triage Vitals  Enc Vitals Group     BP 05/18/19 1109 124/72     Pulse Rate 05/18/19 1109 84     Resp 05/18/19 1109 20     Temp 05/18/19 1109 98.3 F (36.8 C)     Temp Source 05/18/19 1109 Oral     SpO2 05/18/19 1109 98 %     Weight 05/18/19 1110 219 lb (99.3 kg)     Height 05/18/19 1110 5\' 11"  (1.803 m)     Head Circumference --      Peak Flow --      Pain Score 05/18/19 1116 9     Pain Loc --      Pain Edu? --      Excl. in [place]? --     Constitutional: Alert and oriented. Well appearing and in no acute distress. Eyes: Conjunctivae are normal. EOMI. Head: Atraumatic. Nose: No congestion/rhinnorhea. Mouth/Throat: Mucous membranes are moist.   Neck: No stridor. Trachea Midline. FROM.  No C-spine tenderness Cardiovascular: Normal rate, regular rhythm. Grossly normal heart sounds.  Good peripheral circulation.  Tenderness on the right side of chest wall. Respiratory: Normal respiratory effort.  No retractions. Lungs CTAB. Gastrointestinal: Soft and nontender. No distention. No abdominal bruits.  Musculoskeletal: No lower extremity tenderness nor edema.  No joint effusions. Neurologic:  Normal speech and language. No gross focal neurologic deficits are appreciated.  Skin:  Skin is warm, dry and intact. No rash noted. Psychiatric: Mood and affect are normal. Speech and behavior are normal. GU: Deferred   ____________________________________________   LABS (all labs ordered are listed, but only abnormal results are displayed)  Labs Reviewed  BASIC METABOLIC PANEL - Abnormal; Notable for the following components:      Result Value   Glucose, Bld 127 (*)    All other components within normal limits  CBC - Abnormal; Notable for the following components:   Hemoglobin 10.0 (*)     HCT 35.1 (*)    MCV 72.8 (*)    MCH 20.7 (*)    MCHC 28.5 (*)    RDW 17.1 (*)    All other components within  normal limits  TROPONIN I (HIGH SENSITIVITY)  TROPONIN I (HIGH SENSITIVITY)   ____________________________________________   ED ECG REPORT I, Vanessa Hebron, the attending physician, personally viewed and interpreted this ECG.  EKG is normal sinus rate of 84, no ST elevation, and T wave inversion in lead III with occasional PVC and right bundle branch block.  Reviewed prior EKG and this looks similar ____________________________________________  RADIOLOGY Robert Bellow, personally viewed and evaluated these images (plain radiographs) as part of my medical decision making, as well as reviewing the written report by the radiologist.  ED MD interpretation: No obvious rib fracture  Official radiology report(s): DG Ribs Unilateral W/Chest Right  Result Date: 05/18/2019 CLINICAL DATA:  Pain following fall.  Shortness of breath. EXAM: RIGHT RIBS AND CHEST - 3+ VIEW COMPARISON:  Chest radiograph December 03, 2014 FINDINGS: Frontal chest as well as oblique and cone-down rib images obtained. There is underlying hyperexpansion with scarring in the lateral left base. Areas of scarring are noted in the left lower lobe with associated atelectasis and possible consolidation in the left lower lobe region. Interstitial thickening is noted in the right lower lobe, stable. Heart size is normal. There is diminished vascularity in the upper lobes, likely due to a degree of underlying bullous disease in the upper lobes, a stable finding. No adenopathy. A loop recorder is present on the left. No pneumothorax or pleural effusion evident. No appreciable rib fracture. IMPRESSION: No evident rib fracture. Underlying emphysematous change with areas of scarring. Question superimposed atelectasis versus pneumonia left lower lobe. Stable cardiac silhouette. Loop recorder on left. Electronically Signed   By:  Lowella Grip III M.D.   On: 05/18/2019 11:58    ____________________________________________   PROCEDURES  Procedure(s) performed (including Critical Care):  Procedures   ____________________________________________   INITIAL IMPRESSION / ASSESSMENT AND PLAN / ED COURSE  Louis Gutierrez was evaluated in Emergency Department on 05/18/2019 for the symptoms described in the history of present illness. He was evaluated in the context of the global COVID-19 pandemic, which necessitated consideration that the patient might be at risk for infection with the SARS-CoV-2 virus that causes COVID-19. Institutional protocols and algorithms that pertain to the evaluation of patients at risk for COVID-19 are in a state of rapid change based on information released by regulatory bodies including the CDC and federal and state organizations. These policies and algorithms were followed during the patient's care in the ED.    Patient is a 69 year old who comes in with mechanical fall onto his right chest wall.  Will get chest x-ray to evaluate for rib fractures, pneumothorax.  Will get labs to evaluate for electrolyte abnormalities, AKI.  EKG and cardiac markers to evaluate for ACS although his chest pain seems to be reproducible nature.  Patient taken Tylenol without relief in symptoms.  Discussed with patient CT imaging of his head.  Stated that if he had had this fall right before coming in that I would deftly recommend it but now that he is 4 days out and he is having new neuro deficits and no headache patient would like to hold off on CT head.  Patient has no C-spine tenderness to suggest cervical injury.  His abdomen although there was some pain initially seems to be getting better and I have low suspicion for abdominal injury..  Patient does state that he has a loop recorder but is not active.  Respiratory rate is slightly elevated but more likely secondary to the splinting  from the pain.  His oxygen  levels are 100%.   Hemoglobin is at 10 the last check was 2 years ago with 11.7 Chest x-ray no obvious rib fractures but does have possible atelectasis versus pneumonia  Discussed with patient the abnormal chest x-ray on the left but he denies any cough, fevers or symptoms of pneumonia.  Patient states that has been told before that he has some scar tissue on the left side of.  At this time I think we can hold off on antibiotics given my low suspicion for pneumonia.  Patient's pain is on his right side.  Discussed with patient that there was no obvious rib fractures on the x-ray but sometimes it can miss small ones.  We will treat him as if he has a rib fracture.  Given patient is already been taking Tylenol cannot take ibuprofen due to being on Plavix will give a short course of oxycodone, lidocaine patch.  We discussed using incentive spirometry which patient states that he has at home.  Patient understand that he should return to the ER if he develops shortness of breath, coughing, fevers or any other concerns due to his high risk of any pneumonia  I discussed the provisional nature of ED diagnosis, the treatment so far, the ongoing plan of care, follow up appointments and return precautions with the patient and any family or support people present. They expressed understanding and agreed with the plan, discharged home. ____________________________________________   FINAL CLINICAL IMPRESSION(S) / ED DIAGNOSES   Final diagnoses:  Contusion of rib on right side, initial encounter      MEDICATIONS GIVEN DURING THIS VISIT:  Medications  lidocaine (LIDODERM) 5 % 1 patch (1 patch Transdermal Patch Applied 05/18/19 1349)  acetaminophen (TYLENOL) tablet 1,000 mg (1,000 mg Oral Given 05/18/19 1349)  oxyCODONE (Oxy IR/ROXICODONE) immediate release tablet 5 mg (5 mg Oral Given 05/18/19 1349)     ED Discharge Orders         Ordered    oxyCODONE (ROXICODONE) 5 MG immediate release tablet   Every 8 hours PRN     05/18/19 1445           Note:  This document was prepared using Dragon voice recognition software and may include unintentional dictation errors.   Vanessa [place], MD 05/18/19 [phone]

## 2019-05-18 NOTE — ED Triage Notes (Signed)
Pt reports fell from a ladder Saturday about 7 steps up and now with pain to his right flank and left lower abd. Pt takes plavix daily for past MI.

## 2019-05-19 DIAGNOSIS — Z0100 Encounter for examination of eyes and vision without abnormal findings: Secondary | ICD-10-CM | POA: Diagnosis not present

## 2019-06-08 ENCOUNTER — Other Ambulatory Visit: Payer: Self-pay | Admitting: Physician Assistant

## 2019-06-08 ENCOUNTER — Other Ambulatory Visit: Payer: Self-pay

## 2019-06-08 ENCOUNTER — Ambulatory Visit
Admission: RE | Admit: 2019-06-08 | Discharge: 2019-06-08 | Disposition: A | Discharge status: Home / Self Care | Payer: Medicare HMO | Source: Ambulatory Visit | Attending: Physician Assistant | Admitting: Physician Assistant

## 2019-06-08 DIAGNOSIS — J439 Emphysema, unspecified: Secondary | ICD-10-CM | POA: Diagnosis not present

## 2019-06-08 DIAGNOSIS — I700 Atherosclerosis of aorta: Secondary | ICD-10-CM | POA: Diagnosis not present

## 2019-06-08 DIAGNOSIS — D3502 Benign neoplasm of left adrenal gland: Secondary | ICD-10-CM | POA: Diagnosis not present

## 2019-06-08 DIAGNOSIS — Z981 Arthrodesis status: Secondary | ICD-10-CM | POA: Insufficient documentation

## 2019-06-08 DIAGNOSIS — R0781 Pleurodynia: Secondary | ICD-10-CM | POA: Diagnosis not present

## 2019-06-08 DIAGNOSIS — M79601 Pain in right arm: Secondary | ICD-10-CM | POA: Diagnosis not present

## 2019-06-08 DIAGNOSIS — R918 Other nonspecific abnormal finding of lung field: Secondary | ICD-10-CM | POA: Diagnosis not present

## 2019-06-08 DIAGNOSIS — S069X9A Unspecified intracranial injury with loss of consciousness of unspecified duration, initial encounter: Secondary | ICD-10-CM | POA: Insufficient documentation

## 2019-06-08 DIAGNOSIS — Y92009 Unspecified place in unspecified non-institutional (private) residence as the place of occurrence of the external cause: Secondary | ICD-10-CM | POA: Insufficient documentation

## 2019-06-08 DIAGNOSIS — W11XXXA Fall on and from ladder, initial encounter: Secondary | ICD-10-CM | POA: Diagnosis not present

## 2019-06-08 DIAGNOSIS — W19XXXA Unspecified fall, initial encounter: Secondary | ICD-10-CM

## 2019-06-08 DIAGNOSIS — Z87891 Personal history of nicotine dependence: Secondary | ICD-10-CM | POA: Diagnosis not present

## 2019-06-08 DIAGNOSIS — M542 Cervicalgia: Secondary | ICD-10-CM | POA: Insufficient documentation

## 2019-06-08 DIAGNOSIS — Z8673 Personal history of transient ischemic attack (TIA), and cerebral infarction without residual deficits: Secondary | ICD-10-CM | POA: Insufficient documentation

## 2019-06-08 DIAGNOSIS — J984 Other disorders of lung: Secondary | ICD-10-CM | POA: Diagnosis not present

## 2019-06-08 DIAGNOSIS — M25511 Pain in right shoulder: Secondary | ICD-10-CM | POA: Diagnosis not present

## 2019-06-08 DIAGNOSIS — R519 Headache, unspecified: Secondary | ICD-10-CM | POA: Diagnosis not present

## 2019-06-08 DIAGNOSIS — S2241XA Multiple fractures of ribs, right side, initial encounter for closed fracture: Secondary | ICD-10-CM | POA: Diagnosis not present

## 2019-06-10 ENCOUNTER — Other Ambulatory Visit: Payer: Self-pay | Admitting: Cardiovascular Disease

## 2019-06-10 MED ORDER — PRAVASTATIN SODIUM 40 MG PO TABS: 40.0000 mg | ORAL_TABLET | Freq: Every day | ORAL | 3 refills | Status: DC

## 2019-06-10 NOTE — Telephone Encounter (Signed)
*  STAT* If patient is at the pharmacy, call can be transferred to refill team.   1. Which medications need to be refilled? (please list name of each medication and dose if known) pravastatin (PRAVACHOL) 40 MG tablet  2. Which pharmacy/location (including street and city if local pharmacy) is medication to be sent to? [facility] [place], [address] AT [facility] [address]  3. Do they need a 30 day or 90 day supply? 90   Patient is out of medication

## 2019-06-23 ENCOUNTER — Other Ambulatory Visit: Payer: Self-pay | Admitting: Neurosurgery

## 2019-06-23 DIAGNOSIS — M25512 Pain in left shoulder: Secondary | ICD-10-CM | POA: Diagnosis not present

## 2019-06-23 DIAGNOSIS — M25511 Pain in right shoulder: Secondary | ICD-10-CM | POA: Diagnosis not present

## 2019-06-23 DIAGNOSIS — G959 Disease of spinal cord, unspecified: Secondary | ICD-10-CM

## 2019-06-23 DIAGNOSIS — G8929 Other chronic pain: Secondary | ICD-10-CM | POA: Diagnosis not present

## 2019-06-27 DIAGNOSIS — M25511 Pain in right shoulder: Secondary | ICD-10-CM | POA: Diagnosis not present

## 2019-06-27 DIAGNOSIS — S4351XA Sprain of right acromioclavicular joint, initial encounter: Secondary | ICD-10-CM | POA: Diagnosis not present

## 2019-06-27 DIAGNOSIS — M7541 Impingement syndrome of right shoulder: Secondary | ICD-10-CM | POA: Diagnosis not present

## 2019-06-27 DIAGNOSIS — W11XXXA Fall on and from ladder, initial encounter: Secondary | ICD-10-CM | POA: Diagnosis not present

## 2019-06-27 DIAGNOSIS — M7551 Bursitis of right shoulder: Secondary | ICD-10-CM | POA: Diagnosis not present

## 2019-06-27 DIAGNOSIS — M19011 Primary osteoarthritis, right shoulder: Secondary | ICD-10-CM | POA: Diagnosis not present

## 2019-07-11 ENCOUNTER — Ambulatory Visit
Admission: RE | Admit: 2019-07-11 | Discharge: 2019-07-11 | Disposition: A | Discharge status: Home / Self Care | Payer: Medicare HMO | Source: Ambulatory Visit | Attending: Neurosurgery | Admitting: Neurosurgery

## 2019-07-11 ENCOUNTER — Other Ambulatory Visit: Payer: Self-pay

## 2019-07-11 DIAGNOSIS — G959 Disease of spinal cord, unspecified: Secondary | ICD-10-CM

## 2019-07-11 DIAGNOSIS — M542 Cervicalgia: Secondary | ICD-10-CM | POA: Diagnosis not present

## 2019-07-14 DIAGNOSIS — M25512 Pain in left shoulder: Secondary | ICD-10-CM | POA: Diagnosis not present

## 2019-07-14 DIAGNOSIS — G8929 Other chronic pain: Secondary | ICD-10-CM | POA: Diagnosis not present

## 2019-07-14 DIAGNOSIS — M25511 Pain in right shoulder: Secondary | ICD-10-CM | POA: Diagnosis not present

## 2019-07-14 DIAGNOSIS — E119 Type 2 diabetes mellitus without complications: Secondary | ICD-10-CM | POA: Diagnosis not present

## 2019-07-22 DIAGNOSIS — M19012 Primary osteoarthritis, left shoulder: Secondary | ICD-10-CM | POA: Diagnosis not present

## 2019-07-22 DIAGNOSIS — M7542 Impingement syndrome of left shoulder: Secondary | ICD-10-CM | POA: Diagnosis not present

## 2019-07-22 DIAGNOSIS — M7552 Bursitis of left shoulder: Secondary | ICD-10-CM | POA: Diagnosis not present

## 2019-07-22 DIAGNOSIS — M25512 Pain in left shoulder: Secondary | ICD-10-CM | POA: Diagnosis not present

## 2019-07-22 DIAGNOSIS — M7532 Calcific tendinitis of left shoulder: Secondary | ICD-10-CM | POA: Diagnosis not present

## 2019-07-28 ENCOUNTER — Telehealth: Payer: Self-pay

## 2019-07-28 NOTE — Patient Outreach (Signed)
     Opened encounter in error. Correct encounter completed.

## 2019-07-29 ENCOUNTER — Other Ambulatory Visit: Payer: Self-pay

## 2019-07-29 NOTE — Patient Outreach (Signed)
[facility]) Care Management  07/29/2019  Louis Gutierrez Jun 24, 1950 [id number]  Initial Assessment Telephone Screen  Referral Date: 07/29/2019 Referral Source: EMMI-Prevent Referral Reason: "Score 9, HTN,COPD" Insurance: [employer]   Outreach attempt # 1 to patient. Spoke with patient and discussed and reviewed referral source and reason with patient. Patient agreeable to RN  M services. He voices that sometimes "he does stupid things and could use some support." Patient shares that he should not have been on a ladder back in May but was and fell and cracked three ribs. He has healed from injury and reports no further falls. He voices that he is independent with ADLS/IADLs. He lives with supportive spouse. Patient able to drive himself to MD appts. 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 He voices appetite WNL and good. He is adhering to healthy diet lifestyle. Patient denies any SOB or breathing problems at present. He states he has had COPD for 8-10 yrs and is managing well. RN CM discussed COVID-19 vaccine with patient. He is adamant that he has not and will not get vaccine. He reports he is adhering to mask regulations and safety guidelines when out in public. He denies any RN CM needs or concerns at present.   Conditions: Per chart review, patient has PMH that includes but not limited to heart disease, CVA, HTN,COPD and DM. Last A1C on file was 6.6(June 2020). Patient reports he is in the process of having remaining teeth extracted and getting fitted for dentures. He voices that he had cataract removal surgery to both eyes about 3-4 months ago and is doing well.    Medications: Med review completed with patient. He voices that he is able to manage his meds a present and denies any issues or concerns affording them.   Medications Reviewed Today    Reviewed by Hayden Pedro, RN (Registered Nurse) on  07/29/19 at 1021  Med List Status: <None>  Medication Order Taking? Sig Documenting Provider Last Dose Status Informant  acetaminophen (TYLENOL) 500 MG tablet [id number]  Take 1,000 mg by mouth every 8 (eight) hours as needed for mild pain.  [provider]  Active Self  aspirin 81 MG chewable tablet [id number]  Chew 81 mg daily by mouth. [provider]  Active   baclofen (LIORESAL) 10 MG tablet [id number]  Take 1 tablet (10 mg total) by mouth daily. Versie Starks, PA-C  Active   clopidogrel (PLAVIX) 75 MG tablet [id number]  Take 1 tablet (75 mg total) by mouth daily. Croitoru, Mihai, MD  Active   COMBIVENT RESPIMAT 20-100 MCG/ACT AERS respimat [id number]  Inhale 1 puff into the lungs See admin instructions. Takes 1 puff twice a day. Also uses it as a rescue inhaler 1 puff every 6 hours as needed. [provider]  Active Self  isosorbide mononitrate (IMDUR) 30 MG 24 hr tablet [id number]  Take 1 tablet (30 mg total) by mouth daily. Croitoru, Mihai, MD  Active   lidocaine (LIDODERM) 5 % [id number]  Place 1 patch onto the skin every 12 (twelve) hours. Remove & Discard patch within 12 hours or as directed by MD Caryn Section, Linden Dolin, PA-C  Active   nitroGLYCERIN (NITROSTAT) 0.4 MG SL tablet [id number]  Place 1 tablet (0.4 mg total) under the tongue every 5 (five) minutes as needed for chest pain. Croitoru, Mihai, MD  Active   PARoxetine (PAXIL) 40 MG tablet [id number]  Take 40 mg by  mouth daily.  [provider]  Active Self           Med Note Jimmye Norman, Scharlene Gloss   Tue Oct 07, 2016  6:39 PM)    pravastatin (PRAVACHOL) 40 MG tablet [id number]  Take 1 tablet (40 mg total) by mouth daily. Croitoru, Mihai, MD  Active   predniSONE (STERAPRED UNI-PAK 21 TAB) 10 MG (21) TBPK tablet [id number] No Take 6 pills on day one then decrease by 1 pill each day  Patient not taking: Reported on 07/29/2019   Versie Starks, PA-C Not Taking Active   ramipril (ALTACE) 10 MG capsule [id number]  Take 1  capsule (10 mg total) by mouth daily. Croitoru, Mihai, MD  Active   traMADol (ULTRAM) 50 MG tablet [id number] No Take 1 tablet (50 mg total) by mouth every 6 (six) hours as needed.  Patient not taking: Reported on 07/29/2019   Versie Starks, PA-C Not Taking Active   traZODone (DESYREL) 100 MG tablet [id number]  Take 50 mg by mouth at bedtime as needed for sleep.  [provider]  Active Self           Med Note Ovidio Kin Oct 07, 2016  6:39 PM)           Depression screen [facility] 2/9 07/29/2019  Decreased Interest 0  Down, Depressed, Hopeless 0  PHQ - 2 Score 0   Fall Risk  07/29/2019  Falls in the past year? 1  Number falls in past yr: 0  Injury with Fall? 1  Risk for fall due to : History of fall(s);Medication side effect  Follow up Education provided;Falls evaluation completed;Falls prevention discussed   SDOH Screenings   Alcohol Screen:   . Last Alcohol Screening Score (AUDIT):   Depression (PHQ2-9): Low Risk   . PHQ-2 Score: 0  Financial Resource Strain:   . Difficulty of Paying Living Expenses:   Food Insecurity: No Food Insecurity  . Worried About Charity fundraiser in the Last Year: Never true  . Ran Out of Food in the Last Year: Never true  Housing:   . Last Housing Risk Score:   Physical Activity:   . Days of Exercise per Week:   . Minutes of Exercise per Session:   Social Connections:   . Frequency of Communication with Friends and Family:   . Frequency of Social Gatherings with Friends and Family:   . Attends Religious Services:   . Active Member of Clubs or Organizations:   . Attends Archivist Meetings:   Marland Kitchen Marital Status:   Stress:   . Feeling of Stress :   Tobacco Use: High Risk  . Smoking Tobacco Use: Current Every Day Smoker  . Smokeless Tobacco Use: Never Used  Transportation Needs: No Transportation Needs  . Lack of Transportation (Medical): No  . Lack of Transportation (Non-Medical): No   Assessment: [facility] CM Care  Plan Problem One     Most Recent Value  Care Plan Problem One At risk for hospitlization due o co-morbidities   Role Documenting the Problem One Care Management Telephonic Coordinator  Care Plan for Problem One Active  [facility] Long Term Goal  Patient will report no hospitlizations over the next 90 days.   [facility] Long Term Goal Start Date 07/29/19  Interventions for Problem One Long Term Goal RNCM assessed for any acute issues/sxs. RNCM discussed & reviewed COPD action plan with pt. RN CM confirmed pt following up with MD  regularly and knows when,how and why to contact them. RN CM completed med review and assessed for med adherence and barriers.     [facility] CM Care Plan Problem Two     Most Recent Value  Care Plan Problem Two Patient with poor dentition.  Role Documenting the Problem Two Care Management Telephonic Coordinator  Care Plan for Problem Two Active  Interventions for Problem Two Long Term Goal  RNCM completed nutriton eval and confirmed no issues with eating. RN CM confirmed pt has dentist provider and seeing regularly.   Florin will report getting fitted and obtaining dentures within the next 90 days.  [facility] Long Term Goal Start Date 07/29/19  [facility] CM Short Term Goal #1  Patient will report having successful teeth extraction over the next 30 days  [facility] CM Short Term Goal #1 Start Date 07/29/19  Interventions for Short Term Goal #2  RNCM confirmed dental appt in place and no barriers to keeping appt.       Consent: [facility] services reviewed and discussed with patient. Verbal consent for services given.    Plan: RN CM discussed with patient next outreach within two months. Patient gave verbal consent and in agreement with RN CM follow up and timeframe. Patient aware that they may contact RN CM sooner for any issues or concerns. RN CM will send welcome letter to patient. RN CM will send barriers letter and route encounter to PCP.  Enzo Montgomery, RN,BSN,CCM [place]  Management Telephonic Care Management Coordinator Direct Phone: [phone] Toll Free: [phone] Fax: [phone]

## 2019-08-08 ENCOUNTER — Telehealth: Payer: Self-pay

## 2019-08-08 DIAGNOSIS — Z122 Encounter for screening for malignant neoplasm of respiratory organs: Secondary | ICD-10-CM

## 2019-08-08 DIAGNOSIS — Z87891 Personal history of nicotine dependence: Secondary | ICD-10-CM

## 2019-08-08 NOTE — Telephone Encounter (Signed)
Contacted patient to schedule annual lung CT screening scan.  Patient agreeable and scan scheduled for Thursday, Aug 12 at 8:45.  Patient aware of where to go for scan. Patient has not had a COVID vaccine in the past 4 weeks.  His current smoking status is 1 pack per day. Patient feels he has no condition that would prevent him from undergoing treatment in the case of a cancer diagnosis.

## 2019-08-09 NOTE — Addendum Note (Signed)
Addended by: Lieutenant Diego on: 08/09/2019 01:03 PM   Modules accepted: Orders

## 2019-08-09 NOTE — Telephone Encounter (Signed)
Smoking history: current, 54.5 pack year

## 2019-09-17 ENCOUNTER — Other Ambulatory Visit: Payer: Self-pay | Admitting: Cardiovascular Disease

## 2019-09-22 ENCOUNTER — Other Ambulatory Visit: Payer: Self-pay

## 2019-09-22 NOTE — Patient Outreach (Signed)
[facility]) Care Management  09/22/2019  Louis Gutierrez 06-Dec-1950 [id number]   Telephone Assessment    Outreach attempt #1 to patient. No answer. RN CM left HIPAA compliant voicemail message along with contact info.    Plan: RN CM will make outreach attempt to patient within the month of October if no return call.   Enzo Montgomery, RN,BSN,CCM [place] Management Telephonic Care Management Coordinator Direct Phone: [phone] Toll Free: [phone] Fax: [phone]

## 2019-10-02 ENCOUNTER — Other Ambulatory Visit: Payer: Self-pay | Admitting: Cardiovascular Disease

## 2019-10-05 ENCOUNTER — Telehealth: Payer: Self-pay | Admitting: Cardiovascular Disease

## 2019-10-05 NOTE — Telephone Encounter (Signed)
Louis Gutierrez is calling requesting to speak with a nurse in regards to if Dr. Sallyanne Kuster feels it safe for him to get the Covid vaccine. I advised him the office is advising all pt's to receive it, but he is requesting to discuss it further. Please advise.

## 2019-10-05 NOTE — Telephone Encounter (Signed)
Spoke with the pt re: getting the COVID vaccine he says he is considering getting it and going to [facility] for [employer] vaccine but he would feel better about it just hearing of Dr. Sallyanne Kuster thinks he should and he has no cardiac contraindications.. I explained that heartcare is supporting all of our pts to get the vaccine but I will forward to Dr. Sallyanne Kuster for his peace of mind and call him after his review.

## 2019-10-05 NOTE — Telephone Encounter (Signed)
Please tell Louis Gutierrez that he has no cardiac contraindications to receiving the vaccine. On the contrary, his cardiac problems make him more likely to have serious complications from coronavirus infection. I recommend that he receives the vaccine.

## 2019-10-06 NOTE — Telephone Encounter (Signed)
Pt advised and agrees and he will plan to get the [employer] vaccine soon.

## 2019-10-18 ENCOUNTER — Telehealth: Payer: Self-pay | Admitting: Cardiovascular Disease

## 2019-10-18 NOTE — Telephone Encounter (Signed)
Will forward to provider for recommendations 

## 2019-10-18 NOTE — Telephone Encounter (Signed)
Response faxed to [facility].

## 2019-10-18 NOTE — Telephone Encounter (Signed)
Louis Gutierrez has COPD on bronchodilators and is bradycardic. He does not need beta blockers.

## 2019-10-18 NOTE — Telephone Encounter (Signed)
° ° ° °  Louis Gutierrez is calling, she said last 09/24 they faxed a recommendation if Dr. Loletha Grayer agree to give patient beta blocker

## 2019-10-20 ENCOUNTER — Other Ambulatory Visit: Payer: Self-pay | Admitting: Cardiovascular Disease

## 2019-10-24 ENCOUNTER — Other Ambulatory Visit: Payer: Self-pay

## 2019-10-24 NOTE — Patient Outreach (Signed)
[facility]) Care Management  10/24/2019  Louis Gutierrez [birth year] [id number]   Telephone Assessment    Outreach attempt #1 to patient. Spoke briefly with patient who reported he was doing fine. He ws unable to talk at present and requested call back at another time.      Plan: RN CM will make outreach attempt to patient within th month of Nov.   Louis Gutierrez [facility] [place] Management Telephonic Care Management Coordinator Direct Phone: [phone] Toll Free: [phone] Fax: [phone]

## 2019-11-29 ENCOUNTER — Other Ambulatory Visit: Payer: Self-pay

## 2019-11-29 NOTE — Patient Outreach (Signed)
[facility]) Care Management  11/29/2019  Louis Gutierrez [birth year] [id number]   Telephone Assessment   Unsuccessful outreach attempt to patient.      Plan: RN CM will make quarterly outreach attempt to patient within the month of  Feb  if no return call from patient.   Enzo Montgomery, RN,BSN,CCM [place] Management Telephonic Care Management Coordinator Direct Phone: [phone] Toll Free: [phone] Fax: [phone]

## 2019-12-22 ENCOUNTER — Encounter: Payer: Self-pay | Admitting: Cardiovascular Disease

## 2019-12-22 ENCOUNTER — Other Ambulatory Visit: Payer: Self-pay

## 2019-12-22 ENCOUNTER — Ambulatory Visit (INDEPENDENT_AMBULATORY_CARE_PROVIDER_SITE_OTHER): Payer: Medicare HMO | Admitting: Cardiovascular Disease

## 2019-12-22 VITALS — BP 136/78 | HR 88 | Ht 71.0 in | Wt 207.0 lb

## 2019-12-22 DIAGNOSIS — E785 Hyperlipidemia, unspecified: Secondary | ICD-10-CM

## 2019-12-22 DIAGNOSIS — Z8673 Personal history of transient ischemic attack (TIA), and cerebral infarction without residual deficits: Secondary | ICD-10-CM

## 2019-12-22 DIAGNOSIS — R231 Pallor: Secondary | ICD-10-CM

## 2019-12-22 DIAGNOSIS — R7303 Prediabetes: Secondary | ICD-10-CM | POA: Diagnosis not present

## 2019-12-22 DIAGNOSIS — I10 Essential (primary) hypertension: Secondary | ICD-10-CM

## 2019-12-22 DIAGNOSIS — I2510 Atherosclerotic heart disease of native coronary artery without angina pectoris: Secondary | ICD-10-CM

## 2019-12-22 NOTE — Progress Notes (Signed)
Cardiology Office Note    Date:  12/22/2019   ID:  Louis Gutierrez, DOB 1950-03-20, MRN [id number]  PCP:  Kirk Ruths, MD  Cardiologist:   Sanda Klein, MD   Chief Complaint  Patient presents with   Follow-up    12 months.   Shortness of Breath    History of Present Illness:  Louis Gutierrez is a 69 y.o. male with coronary artery disease and a history of cryptogenic stroke.    He has had a pretty good year except for a fall from a ladder about 6 months ago.  He cracked several ribs.  He did have head impact but a head CT showed normal findings.  He was quite short of breath for a while after the injury and quit smoking successfully since then.  He continues to work, but on a reduced schedule and seems to be much more content.  He has not had any problems with angina pectoris and has not had any neurological complaints.  He has managed to lose some weight and is no longer obese.  He looks pale to me today, but he states that this is not unusual for him.  He had a screening CT lung cancer which showed coronary artery calcifications (not surprising) and gallstones (asymptomatic).  Since this cryptogenic stroke, he has an implantable loop recorder that has reached end of service and is left in place.  During 3 years of monitoring that did not show atrial fibrillation.  He has not had any new neurological events.  His loop recorder has reached end of service.  He presented with ST segment elevation myocardial infarction in May of 2012. At that time had an inferior wall infarction, manifesting as unstable angina pectoris and eventually a syncopal episode. The right coronary artery was totally occluded and revascularization was difficult but eventually he received a 3.5 x 24 mm Promus drug-eluting stent to the mid RCA. Also noted was 50% ostial stenosis of the first diagonal artery and 40% ostial stenosis of the second diagonal artery branches of the LAD, as well as total occlusion of  a small left circumflex system. There was evidence of inferolateral hypokinesis and ejection fraction of 45-50% at that time. He presented with chest [facility] to [facility] in 2015 when he had a repeat coronary angiogram that was reportedly normal. I don't have that report. In May 2015 he presented with right hemiparesis and expressive aphasia with an infarction in the territory of the left middle cerebral artery. An etiology was not identified. He underwent loop recorder implantation in August 2015 and in 3 years the device has not recorded any arrhythmia. His most recent echo performed in March 2015 showed left ventricular ejection fraction of 50-55 percent with abnormal relaxation and no regional wall motion abnormalities. Both atria were described as normal in size and there were no serious valvular abnormalities.  Identical findings recorded in September 2018 echo, again EF 50-55% and the left atrium was described as normal in size.   Past Medical History:  Diagnosis Date   Asthma without status asthmaticus    Atherosclerotic cerebrovascular disease    Overview:  Small and facial weakness with minimal weakness    CAD (coronary artery disease)    a. 05/2010 Inf STEMI/PCI: RCA 135m (3.5x24 Promus DES), LCX small, 100, D1 50ost, D2 40ost, EF 45-50%; b. 03/2013 Cath ([facility]): Report not available - D/C summary says no significant dzs.   Cervical disc disease    a.  s/p C3-4 fusion 2003.   Cervical myelopathy ([place])    a. 09/2016 Admit w/ left sided wkns->found to have progressive cervical disc dzs above and below prior fusioin site; b. 10/2016 s/p ant cervical diskectomy.   COPD (chronic obstructive pulmonary disease) ([place])    Cryptogenic stroke ([place])    a. 05/2013 s/p acute L MCA territory infarct; b. 07/2013 s/p MDT Linq ILR-->No AFib to documented to date (09/2016).   Depression    Essential (primary) hypertension    H/O insomnia    Inguinal hernia    Ischemic  cardiomyopathy    a. 05/2010 LV gram: EF 45-50% @ time of inf MI; b. 09/2016 Echo: EF 50-55%, inf HK, Gr1 DD, mild MR.   Mixed hyperlipidemia    Nephrolithiasis    Obesity    OSA (obstructive sleep apnea)    Osteoarthritis    STEMI (ST elevation myocardial infarction) ([place]) 06/07/2010   Tobacco abuse    Urinary incontinence    Varicose veins     Past Surgical History:  Procedure Laterality Date   ANTERIOR CERVICAL DECOMP/DISCECTOMY FUSION N/A 10/22/2016   Procedure: ANTERIOR CERVICAL DECOMPRESSION/DISCECTOMY FUSION 2 LEVELS C3-4, C6-7;  Surgeon: Meade Maw, MD;  Location: [facility] ORS;  Service: Neurosurgery;  Laterality: N/A;   BACK SURGERY  2003   C3C4   CARDIAC CATHETERIZATION  06/07/2010   50 % ostial narrowing in 1st diagonal from LAD, 2nd diagonal had 40% ostial narrowing; AV groove circumflex was diminutive and occluded proximally; RCA totally occluded in mid segment - stented with 3.5 x 65mm Promus Element DES  - See Media tab   CARDIAC CATHETERIZATION  05/2013   [place]   CERVICAL FUSION     c4/5/6   COLONOSCOPY WITH PROPOFOL N/A 02/26/2015   Procedure: COLONOSCOPY WITH PROPOFOL;  Surgeon: Josefine Class, MD;  Location: [facility] ENDOSCOPY;  Service: Endoscopy;  Laterality: N/A;   ESOPHAGOGASTRODUODENOSCOPY  02/26/2015   Procedure: ESOPHAGOGASTRODUODENOSCOPY (EGD);  Surgeon: Josefine Class, MD;  Location: [facility] ENDOSCOPY;  Service: Endoscopy;;   LOOP RECORDER IMPLANT  08/16/13   Dr. Sallyanne Kuster    Current Medications: Outpatient Medications Prior to Visit  Medication Sig Dispense Refill   acetaminophen (TYLENOL) 500 MG tablet Take 1,000 mg by mouth every 8 (eight) hours as needed for mild pain.      aspirin 81 MG chewable tablet Chew 81 mg daily by mouth.     baclofen (LIORESAL) 10 MG tablet Take 1 tablet (10 mg total) by mouth daily. 30 tablet 1   clopidogrel (PLAVIX) 75 MG tablet TAKE 1 TABLET(75 MG) BY MOUTH DAILY 90 tablet 1   COMBIVENT RESPIMAT  20-100 MCG/ACT AERS respimat Inhale 1 puff into the lungs See admin instructions. Takes 1 puff twice a day. Also uses it as a rescue inhaler 1 puff every 6 hours as needed.     isosorbide mononitrate (IMDUR) 30 MG 24 hr tablet TAKE 1 TABLET(30 MG) BY MOUTH DAILY 90 tablet 1   lidocaine (LIDODERM) 5 % Place 1 patch onto the skin every 12 (twelve) hours. Remove & Discard patch within 12 hours or as directed by MD 10 patch 0   nitroGLYCERIN (NITROSTAT) 0.4 MG SL tablet PLACE 1 TABLET UNDER THE TONGUE EVERY 5 MINUTES AS NEEDED FOR CHEST PAIN 25 tablet 2   PARoxetine (PAXIL) 40 MG tablet Take 40 mg by mouth daily.      pravastatin (PRAVACHOL) 40 MG tablet Take 1 tablet (40 mg total) by mouth daily. 90 tablet 3  predniSONE (STERAPRED UNI-PAK 21 TAB) 10 MG (21) TBPK tablet Take 6 pills on day one then decrease by 1 pill each day 21 tablet 0   ramipril (ALTACE) 10 MG capsule TAKE 1 CAPSULE(10 MG) BY MOUTH DAILY 90 capsule 1   traMADol (ULTRAM) 50 MG tablet Take 1 tablet (50 mg total) by mouth every 6 (six) hours as needed. 15 tablet 0   traZODone (DESYREL) 100 MG tablet Take 50 mg by mouth at bedtime as needed for sleep.      No facility-administered medications prior to visit.     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  Tobacco Use   Smoking status: Current Every Day Smoker    Packs/day: 1.00    Years: 53.00    Pack years: 53.00    Types: Cigarettes   Smokeless tobacco: Never Used   Tobacco comment: trying to cut back - currently smoking 0.5 ppd.  Vaping Use   Vaping Use: Former  Substance and Sexual Activity   Alcohol use: No   Drug use: No   Sexual activity: Not on  file  Other Topics Concern   Not on file  Social History Narrative   Lives in [place] with wife, dtr, and 2 grandchildren.  Walks dog 30 mins 2x/day w/o difficulties.   Social Determinants of Health   Financial Resource Strain:    Difficulty of Paying Living Expenses: Not on file  Food Insecurity: No Food Insecurity   Worried About Charity fundraiser in the Last Year: Never true   Ran Out of Food in the Last Year: Never true  Transportation Needs: No Transportation Needs   Lack of Transportation (Medical): No   Lack of Transportation (Non-Medical): No  Physical Activity:    Days of Exercise per Week: Not on file   Minutes of Exercise per Session: Not on file  Stress:    Feeling of Stress : Not on file  Social Connections:    Frequency of Communication with Friends and Family: Not on file   Frequency of Social Gatherings with Friends and Family: Not on file   Attends Religious Services: Not on file   Active Member of Clubs or Organizations: Not on file   Attends Archivist Meetings: Not on file   Marital Status: Not on file     Family History:  The patient's family history includes Cancer in his mother; Coronary artery disease in his brother; Diabetes in his brother; Stroke in his maternal grandmother.   ROS:   Please see the history of present illness.    ROS All other systems are reviewed and are negative.   PHYSICAL EXAM:   VS:  BP 136/78 (BP Location: Left Arm, Patient Position: Sitting, Cuff Size: Normal)    Pulse 88    Ht 5\' 11"  (1.803 m)    Wt 207 lb (93.9 kg)    BMI 28.87 kg/m       General: Alert, oriented x3, no distress, overweight.  Fingernail beds are very pale.  Has a few superficial bruises on his forearms. Head: no evidence of trauma, PERRL, EOMI, no exophtalmos or lid lag,  no myxedema, no xanthelasma; normal ears, nose and oropharynx Neck: normal jugular venous pulsations and no hepatojugular reflux; brisk carotid pulses without  delay and no carotid bruits Chest: clear to auscultation, no signs of consolidation by percussion or palpation, normal fremitus, symmetrical and full respiratory excursions Cardiovascular: normal position and quality of the apical impulse, regular rhythm, normal first and second heart sounds, no murmurs, rubs or gallops Abdomen: no tenderness or distention, no masses by palpation, no abnormal pulsatility or arterial bruits, normal bowel sounds, no hepatosplenomegaly Extremities: no clubbing, cyanosis or edema; 2+ radial, ulnar and brachial pulses bilaterally; 2+ right femoral, posterior tibial and dorsalis pedis pulses; 2+ left femoral, posterior tibial and dorsalis pedis pulses; no subclavian or femoral bruits Neurological: grossly nonfocal Psych: Normal mood and affect    Wt Readings from Last 3 Encounters:  12/22/19 207 lb (93.9 kg)  05/18/19 219 lb (99.3 kg)  12/26/18 213 lb (96.6 kg)      Studies/Labs Reviewed:   EKG:  EKG is ordered today.  It shows sinus rhythm with single PVC, old right bundle branch block, inferior Q waves, no ischemic ST-T changes, QTC 462 ms Recent Labs: 07/08/2018 Total cholesterol 104, triglycerides 78, HDL 34, LDL 54 Hemoglobin A1c 6.6%  08/26/2018 Potassium 4.4, creatinine 1.0  August 31, 2017 Creatinine 0.9, BUN 13, potassium 4.4, aldosterone/renin ratio 3.1, cortisol 7.3 (ordered for evaluation of an adrenal adenoma, incidentally discovered during CT of the chest for lung cancer screening)  July 01, 2017  hemoglobin A1c 6.5%  Total cholesterol 109, triglycerides 78, HDL 34, LDL 59  Lipid Panel    Component Value Date/Time   CHOL 116 10/08/2016 0507   CHOL 110 05/22/2013 0526   TRIG 87 10/08/2016 0507   TRIG 162 05/22/2013 0526   HDL 37 (L) 10/08/2016 0507   HDL 17 (L) 05/22/2013 0526   CHOLHDL 3.1 10/08/2016 0507   VLDL 17 10/08/2016 0507   VLDL 32 05/22/2013 0526   LDLCALC 62 10/08/2016 0507   LDLCALC 61 05/22/2013 0526      ASSESSMENT:    1. Coronary artery disease involving native coronary artery of native heart without angina pectoris   2. History of arterial ischemic stroke   3. Dyslipidemia (high LDL; low HDL)   4. Essential hypertension   5. Prediabetes   6. Pallor      PLAN:  In order of problems listed above:  1. CAD: Asymptomatic despite a relatively active lifestyle.  On beta-blocker, aspirin, clopidogrel, statin, long-acting nitrates, ACE inhibitor. 2. Hx cryptogenic ischemic stroke: No new symptoms.  On antiplatelet therapy with aspirin and clopidogrel.  3 years of implantable loop monitoring did not show atrial fibrillation. 3. HLP/prediabetes: He's lost quite a bit of weight and is no longer in obesity range.  Time to recheck his lipid profile.  Target LDL less than 70.  Recheck his hemoglobin A1c which was borderline for diagnosis of diabetes in June of last year at 6.6%. 4. HTN: Slightly high systolic blood pressure, but at home this is consistently in the 120s. 5. Smoking: I am very happy that he has quit smoking.  Encourage him never to start again. 6. Pallor: We will check a CBC when we repeat his lipid profile.  In April 2021 he had mild anemia with a hemoglobin of 10 and microcytic/hypochromic erythrocyte parameters.  I am concerned he has worsening anemia due to iron deficiency.   Medication Adjustments/Labs and Tests Ordered: Current medicines are reviewed at length with the patient today.  Concerns regarding medicines are outlined above.  Medication changes, Labs and Tests ordered today are listed in the Patient Instructions below. Patient Instructions  Medication Instructions:  No changes *If you need a refill on your cardiac medications before your next appointment, please call your pharmacy*   Lab Work: Your provider would like for you to have the following labs today: CBC, CMET, Lipid and A1C  If you have labs (blood work) drawn today and your tests are completely  normal, you will receive your results only by:  [place] (if you have MyChart) OR  A paper copy in the mail If you have any lab test that is abnormal or we need to change your treatment, we will call you to review the results.   Testing/Procedures: None ordered   Follow-Up: At [facility], you and your health needs are our priority.  As part of our continuing mission to provide you with exceptional heart care, we have created designated Provider Care Teams.  These Care Teams include your primary Cardiologist (physician) and Advanced Practice Providers (APPs -  Physician Assistants and Nurse Practitioners) who all work together to provide you with the care you need, when you need it.  We recommend signing up for the patient portal called "MyChart".  Sign up information is provided on this After Visit Summary.  MyChart is used to connect with patients for Virtual Visits (Telemedicine).  Patients are able to view lab/test results, encounter notes, upcoming appointments, etc.  Non-urgent messages can be sent to your provider as well.   To learn more about what you can do with MyChart, go to [email or web address].    Your next appointment:   12 month(s)  The format for your next appointment:   In Person  Provider:   You may see Sanda Klein, MD or one of the following Advanced Practice Providers on your designated Care Team:    Almyra Deforest, PA-C  Fabian Sharp, [place] or   Roby Lofts, PA-C       Signed, Sanda Klein, MD  12/22/2019 12:51 PM    [address], [place], [place]  [address] Phone: [phone]; Fax: [phone]

## 2019-12-22 NOTE — Patient Instructions (Signed)
Medication Instructions:  No changes *If you need a refill on your cardiac medications before your next appointment, please call your pharmacy*   Lab Work: Your provider would like for you to have the following labs today: CBC, CMET, Lipid and A1C  If you have labs (blood work) drawn today and your tests are completely normal, you will receive your results only by: Marland Kitchen MyChart Message (if you have MyChart) OR . A paper copy in the mail If you have any lab test that is abnormal or we need to change your treatment, we will call you to review the results.   Testing/Procedures: None ordered   Follow-Up: At [facility], you and your health needs are our priority.  As part of our continuing mission to provide you with exceptional heart care, we have created designated Provider Care Teams.  These Care Teams include your primary Cardiologist (physician) and Advanced Practice Providers (APPs -  Physician Assistants and Nurse Practitioners) who all work together to provide you with the care you need, when you need it.  We recommend signing up for the patient portal called "MyChart".  Sign up information is provided on this After Visit Summary.  MyChart is used to connect with patients for Virtual Visits (Telemedicine).  Patients are able to view lab/test results, encounter notes, upcoming appointments, etc.  Non-urgent messages can be sent to your provider as well.   To learn more about what you can do with MyChart, go to [email or web address].    Your next appointment:   12 month(s)  The format for your next appointment:   In Person  Provider:   You may see Sanda Klein, MD or one of the following Advanced Practice Providers on your designated Care Team:    Almyra Deforest, PA-C  Fabian Sharp, PA-C or   Roby Lofts, [place]

## 2019-12-23 ENCOUNTER — Other Ambulatory Visit: Payer: Self-pay | Admitting: *Deleted

## 2019-12-23 DIAGNOSIS — D649 Anemia, unspecified: Secondary | ICD-10-CM

## 2019-12-23 LAB — COMPREHENSIVE METABOLIC PANEL
ALT: 11 IU/L (ref 0–44)
AST: 12 IU/L (ref 0–40)
Albumin/Globulin Ratio: 1.6 (ref 1.2–2.2)
Albumin: 4.2 g/dL (ref 3.8–4.8)
Alkaline Phosphatase: 163 IU/L — ABNORMAL HIGH (ref 44–121)
BUN/Creatinine Ratio: 11 (ref 10–24)
BUN: 12 mg/dL (ref 8–27)
Bilirubin Total: 0.3 mg/dL (ref 0.0–1.2)
CO2: 28 mmol/L (ref 20–29)
Calcium: 9.7 mg/dL (ref 8.6–10.2)
Chloride: 100 mmol/L (ref 96–106)
Creatinine, Ser: 1.1 mg/dL (ref 0.76–1.27)
GFR calc Af Amer: 79 mL/min/{1.73_m2} (ref >59)
GFR calc non Af Amer: 68 mL/min/{1.73_m2} (ref >59)
Globulin, Total: 2.7 g/dL (ref 1.5–4.5)
Glucose: 153 mg/dL — ABNORMAL HIGH (ref 65–99)
Potassium: 4.9 mmol/L (ref 3.5–5.2)
Sodium: 139 mmol/L (ref 134–144)
Total Protein: 6.9 g/dL (ref 6.0–8.5)

## 2019-12-23 LAB — LIPID PANEL
Chol/HDL Ratio: 2.5 ratio (ref 0.0–5.0)
Cholesterol, Total: 115 mg/dL (ref 100–199)
HDL: 46 mg/dL (ref >39)
LDL Chol Calc (NIH): 55 mg/dL (ref 0–99)
Triglycerides: 66 mg/dL (ref 0–149)
VLDL Cholesterol Cal: 14 mg/dL (ref 5–40)

## 2019-12-23 LAB — CBC
Hematocrit: 33.8 % — ABNORMAL LOW (ref 37.5–51.0)
Hemoglobin: 10.1 g/dL — ABNORMAL LOW (ref 13.0–17.7)
MCH: 21.6 pg — ABNORMAL LOW (ref 26.6–33.0)
MCHC: 29.9 g/dL — ABNORMAL LOW (ref 31.5–35.7)
MCV: 72 fL — ABNORMAL LOW (ref 79–97)
Platelets: 346 10*3/uL (ref 150–450)
RBC: 4.67 x10E6/uL (ref 4.14–5.80)
RDW: 15.9 % — ABNORMAL HIGH (ref 11.6–15.4)
WBC: 6.2 10*3/uL (ref 3.4–10.8)

## 2019-12-23 LAB — HEMOGLOBIN A1C
Est. average glucose Bld gHb Est-mCnc: 137 mg/dL
Hgb A1c MFr Bld: 6.4 % — ABNORMAL HIGH (ref 4.8–5.6)

## 2019-12-30 ENCOUNTER — Other Ambulatory Visit: Payer: Self-pay

## 2019-12-30 ENCOUNTER — Other Ambulatory Visit (INDEPENDENT_AMBULATORY_CARE_PROVIDER_SITE_OTHER): Payer: Medicare HMO

## 2019-12-30 DIAGNOSIS — D649 Anemia, unspecified: Secondary | ICD-10-CM | POA: Diagnosis not present

## 2019-12-31 LAB — IRON,TIBC AND FERRITIN PANEL
Ferritin: 6 ng/mL — ABNORMAL LOW (ref 30–400)
Iron Saturation: 5 % — CL (ref 15–55)
Iron: 19 ug/dL — ABNORMAL LOW (ref 38–169)
Total Iron Binding Capacity: 407 ug/dL (ref 250–450)
UIBC: 388 ug/dL — ABNORMAL HIGH (ref 111–343)

## 2020-01-03 ENCOUNTER — Other Ambulatory Visit: Payer: Self-pay | Admitting: *Deleted

## 2020-01-03 MED ORDER — FERROUS SULFATE 325 (65 FE) MG PO TBEC: 325.0000 mg | DELAYED_RELEASE_TABLET | Freq: Two times a day (BID) | ORAL | 11 refills | Status: DC

## 2020-01-05 DIAGNOSIS — E119 Type 2 diabetes mellitus without complications: Secondary | ICD-10-CM | POA: Diagnosis not present

## 2020-01-05 DIAGNOSIS — I10 Essential (primary) hypertension: Secondary | ICD-10-CM | POA: Diagnosis not present

## 2020-01-05 DIAGNOSIS — Z0000 Encounter for general adult medical examination without abnormal findings: Secondary | ICD-10-CM | POA: Diagnosis not present

## 2020-01-05 DIAGNOSIS — I25119 Atherosclerotic heart disease of native coronary artery with unspecified angina pectoris: Secondary | ICD-10-CM | POA: Diagnosis not present

## 2020-01-05 DIAGNOSIS — F325 Major depressive disorder, single episode, in full remission: Secondary | ICD-10-CM | POA: Diagnosis not present

## 2020-01-05 DIAGNOSIS — D509 Iron deficiency anemia, unspecified: Secondary | ICD-10-CM | POA: Diagnosis not present

## 2020-01-30 DIAGNOSIS — Z7902 Long term (current) use of antithrombotics/antiplatelets: Secondary | ICD-10-CM | POA: Diagnosis not present

## 2020-01-30 DIAGNOSIS — D509 Iron deficiency anemia, unspecified: Secondary | ICD-10-CM | POA: Diagnosis not present

## 2020-01-30 DIAGNOSIS — K219 Gastro-esophageal reflux disease without esophagitis: Secondary | ICD-10-CM | POA: Diagnosis not present

## 2020-01-30 DIAGNOSIS — D649 Anemia, unspecified: Secondary | ICD-10-CM | POA: Diagnosis not present

## 2020-02-21 ENCOUNTER — Other Ambulatory Visit: Payer: Self-pay

## 2020-02-21 NOTE — Patient Outreach (Signed)
[facility]) Care Management  02/21/2020  JENNINGS CORADO 1950-08-11 [id number]   Telephone Assessment Quarterly Call   Unsuccessful quarterly outreach attempt to patient.     Plan: RN CM will make quarterly outreach attempt to patient within the month of May if no return call from patient.  Enzo Montgomery, RN,BSN,CCM [place] Management Telephonic Care Management Coordinator Direct Phone: [phone] Toll Free: [phone] Fax: [phone]

## 2020-03-14 ENCOUNTER — Other Ambulatory Visit: Payer: Self-pay

## 2020-03-14 ENCOUNTER — Other Ambulatory Visit
Admission: RE | Admit: 2020-03-14 | Discharge: 2020-03-14 | Disposition: A | Discharge status: Home / Self Care | Payer: Medicare HMO | Source: Ambulatory Visit | Attending: Gastroenterology | Admitting: Gastroenterology

## 2020-03-14 DIAGNOSIS — Z20822 Contact with and (suspected) exposure to covid-19: Secondary | ICD-10-CM | POA: Insufficient documentation

## 2020-03-14 DIAGNOSIS — Z01812 Encounter for preprocedural laboratory examination: Secondary | ICD-10-CM | POA: Insufficient documentation

## 2020-03-14 LAB — SARS CORONAVIRUS 2 (TAT 6-24 HRS): SARS Coronavirus 2: NEGATIVE

## 2020-03-15 ENCOUNTER — Encounter: Payer: Self-pay | Admitting: *Deleted

## 2020-03-16 ENCOUNTER — Ambulatory Visit: Payer: Medicare HMO | Admitting: Certified Registered Nurse Anesthetist

## 2020-03-16 ENCOUNTER — Encounter
Admission: RE | Disposition: A | Discharge status: Home / Self Care | Payer: Self-pay | Source: Home / Self Care | Attending: Gastroenterology

## 2020-03-16 ENCOUNTER — Ambulatory Visit
Admission: RE | Admit: 2020-03-16 | Discharge: 2020-03-16 | Disposition: A | Discharge status: Home / Self Care | Payer: Medicare HMO | Attending: Gastroenterology | Admitting: Gastroenterology

## 2020-03-16 ENCOUNTER — Encounter: Payer: Self-pay | Admitting: *Deleted

## 2020-03-16 ENCOUNTER — Other Ambulatory Visit: Payer: Self-pay

## 2020-03-16 DIAGNOSIS — Z7982 Long term (current) use of aspirin: Secondary | ICD-10-CM | POA: Insufficient documentation

## 2020-03-16 DIAGNOSIS — D509 Iron deficiency anemia, unspecified: Secondary | ICD-10-CM | POA: Insufficient documentation

## 2020-03-16 DIAGNOSIS — D123 Benign neoplasm of transverse colon: Secondary | ICD-10-CM | POA: Insufficient documentation

## 2020-03-16 DIAGNOSIS — Z8673 Personal history of transient ischemic attack (TIA), and cerebral infarction without residual deficits: Secondary | ICD-10-CM | POA: Insufficient documentation

## 2020-03-16 DIAGNOSIS — Z79899 Other long term (current) drug therapy: Secondary | ICD-10-CM | POA: Insufficient documentation

## 2020-03-16 DIAGNOSIS — D128 Benign neoplasm of rectum: Secondary | ICD-10-CM | POA: Diagnosis not present

## 2020-03-16 DIAGNOSIS — Z7902 Long term (current) use of antithrombotics/antiplatelets: Secondary | ICD-10-CM | POA: Diagnosis not present

## 2020-03-16 DIAGNOSIS — K6289 Other specified diseases of anus and rectum: Secondary | ICD-10-CM | POA: Insufficient documentation

## 2020-03-16 DIAGNOSIS — Z955 Presence of coronary angioplasty implant and graft: Secondary | ICD-10-CM | POA: Diagnosis not present

## 2020-03-16 DIAGNOSIS — K635 Polyp of colon: Secondary | ICD-10-CM | POA: Insufficient documentation

## 2020-03-16 DIAGNOSIS — K5730 Diverticulosis of large intestine without perforation or abscess without bleeding: Secondary | ICD-10-CM | POA: Insufficient documentation

## 2020-03-16 DIAGNOSIS — Z86010 Personal history of colonic polyps: Secondary | ICD-10-CM | POA: Insufficient documentation

## 2020-03-16 DIAGNOSIS — I2510 Atherosclerotic heart disease of native coronary artery without angina pectoris: Secondary | ICD-10-CM | POA: Diagnosis not present

## 2020-03-16 DIAGNOSIS — Z880 Allergy status to penicillin: Secondary | ICD-10-CM | POA: Insufficient documentation

## 2020-03-16 DIAGNOSIS — K641 Second degree hemorrhoids: Secondary | ICD-10-CM | POA: Insufficient documentation

## 2020-03-16 DIAGNOSIS — K2970 Gastritis, unspecified, without bleeding: Secondary | ICD-10-CM | POA: Diagnosis not present

## 2020-03-16 DIAGNOSIS — D125 Benign neoplasm of sigmoid colon: Secondary | ICD-10-CM | POA: Diagnosis not present

## 2020-03-16 DIAGNOSIS — K2950 Unspecified chronic gastritis without bleeding: Secondary | ICD-10-CM | POA: Insufficient documentation

## 2020-03-16 DIAGNOSIS — K621 Rectal polyp: Secondary | ICD-10-CM | POA: Diagnosis not present

## 2020-03-16 DIAGNOSIS — K219 Gastro-esophageal reflux disease without esophagitis: Secondary | ICD-10-CM | POA: Diagnosis not present

## 2020-03-16 DIAGNOSIS — D124 Benign neoplasm of descending colon: Secondary | ICD-10-CM | POA: Diagnosis not present

## 2020-03-16 DIAGNOSIS — K2930 Chronic superficial gastritis without bleeding: Secondary | ICD-10-CM | POA: Diagnosis not present

## 2020-03-16 DIAGNOSIS — K3189 Other diseases of stomach and duodenum: Secondary | ICD-10-CM | POA: Diagnosis not present

## 2020-03-16 DIAGNOSIS — F418 Other specified anxiety disorders: Secondary | ICD-10-CM | POA: Diagnosis not present

## 2020-03-16 HISTORY — PX: ESOPHAGOGASTRODUODENOSCOPY (EGD) WITH PROPOFOL: SHX5813

## 2020-03-16 HISTORY — PX: COLONOSCOPY WITH PROPOFOL: SHX5780

## 2020-03-16 HISTORY — DX: Type 2 diabetes mellitus without complications: E11.9

## 2020-03-16 HISTORY — DX: Insomnia, unspecified: G47.00

## 2020-03-16 HISTORY — DX: Anxiety disorder, unspecified: F41.9

## 2020-03-16 HISTORY — DX: Gastritis, unspecified, without bleeding: K29.70

## 2020-03-16 LAB — GLUCOSE, CAPILLARY: Glucose-Capillary: 110 mg/dL — ABNORMAL HIGH (ref 70–99)

## 2020-03-16 SURGERY — COLONOSCOPY WITH PROPOFOL
Anesthesia: General

## 2020-03-16 MED ORDER — PROPOFOL 10 MG/ML IV BOLUS
INTRAVENOUS | Status: AC
  Filled 2020-03-16: qty 20

## 2020-03-16 MED ORDER — SODIUM CHLORIDE 0.9 % IV SOLN: INTRAVENOUS | Status: DC

## 2020-03-16 MED ORDER — LIDOCAINE HCL (CARDIAC) PF 100 MG/5ML IV SOSY
PREFILLED_SYRINGE | INTRAVENOUS | Status: DC | PRN
  Administered 2020-03-16: 100 mg via INTRAVENOUS

## 2020-03-16 MED ORDER — PHENYLEPHRINE HCL (PRESSORS) 10 MG/ML IV SOLN
INTRAVENOUS | Status: DC | PRN
  Administered 2020-03-16 (×3): 100 ug via INTRAVENOUS

## 2020-03-16 MED ORDER — PROPOFOL 10 MG/ML IV BOLUS
INTRAVENOUS | Status: DC | PRN
  Administered 2020-03-16: 70 mg via INTRAVENOUS
  Administered 2020-03-16: 30 mg via INTRAVENOUS

## 2020-03-16 MED ORDER — PROPOFOL 500 MG/50ML IV EMUL
INTRAVENOUS | Status: DC | PRN
  Administered 2020-03-16: 170 ug/kg/min via INTRAVENOUS

## 2020-03-16 NOTE — Interval H&P Note (Signed)
History and Physical Interval Note:  03/16/2020 10:17 AM  Louis Gutierrez  has presented today for surgery, with the diagnosis of FE DEF ANEMIA GERD.  The various methods of treatment have been discussed with the patient and family. After consideration of risks, benefits and other options for treatment, the patient has consented to  Procedure(s): COLONOSCOPY WITH PROPOFOL (N/A) ESOPHAGOGASTRODUODENOSCOPY (EGD) WITH PROPOFOL (N/A) as a surgical intervention.  The patient's history has been reviewed, patient examined, no change in status, stable for surgery.  I have reviewed the patient's chart and labs.  Questions were answered to the patient's satisfaction.     Lesly Rubenstein  Ok to proceed with EGD/Colonoscopy. Last dose of plavix >5 days ago

## 2020-03-16 NOTE — Op Note (Signed)
[facility] Gastroenterology Patient Name: Louis Gutierrez Procedure Date: 03/16/2020 10:05 AM MRN: [id number] Account #: [id number] Date of Birth: [birth year] Admit Type: Outpatient Age: 70 Room: [facility] ENDO ROOM 3 Gender: Male Note Status: Finalized Procedure:             Colonoscopy Indications:           Iron deficiency anemia Providers:             Andrey Farmer MD, MD Medicines:             Monitored Anesthesia Care Complications:         No immediate complications. Estimated blood loss:                         Minimal. Procedure:             Pre-Anesthesia Assessment:                        - Prior to the procedure, a History and Physical was                         performed, and patient medications and allergies were                         reviewed. The patient is competent. The risks and                         benefits of the procedure and the sedation options and                         risks were discussed with the patient. All questions                         were answered and informed consent was obtained.                         Patient identification and proposed procedure were                         verified by the physician, the nurse, the anesthetist                         and the technician in the endoscopy suite. Mental                         Status Examination: alert and oriented. Airway                         Examination: normal oropharyngeal airway and neck                         mobility. Respiratory Examination: clear to                         auscultation. CV Examination: normal. Prophylactic                         Antibiotics: The patient does not require prophylactic  antibiotics. Prior Anticoagulants: The patient has                         taken Plavix (clopidogrel), last dose was 6 days prior                         to procedure. ASA Grade Assessment: III - A patient                         with severe  systemic disease. After reviewing the                         risks and benefits, the patient was deemed in                         satisfactory condition to undergo the procedure. The                         anesthesia plan was to use monitored anesthesia care                         (MAC). Immediately prior to administration of                         medications, the patient was re-assessed for adequacy                         to receive sedatives. The heart rate, respiratory                         rate, oxygen saturations, blood pressure, adequacy of                         pulmonary ventilation, and response to care were                         monitored throughout the procedure. The physical                         status of the patient was re-assessed after the                         procedure.                        After obtaining informed consent, the colonoscope was                         passed under direct vision. Throughout the procedure,                         the patient's blood pressure, pulse, and oxygen                         saturations were monitored continuously. The                         Colonoscope was introduced through the anus and  advanced to the the terminal ileum. The colonoscopy                         was performed without difficulty. The patient                         tolerated the procedure well. The quality of the bowel                         preparation was fair. Findings:      The perianal and digital rectal examinations were normal.      The terminal ileum appeared normal.      A tattoo was seen at the hepatic flexure, in the ascending colon and in       the distal ascending colon. The tattoo site appeared normal.      A 10 mm polyp was found in the distal transverse colon. The polyp was       semi-pedunculated. The polyp was removed with a hot snare. Resection and       retrieval were complete. Estimated blood loss was  minimal.      Five sessile polyps were found in the recto-sigmoid colon. The polyps       were 2 to 5 mm in size. These polyps were removed with a cold snare.       Resection and retrieval were complete. Estimated blood loss was minimal.      Two sessile polyps were found in the recto-sigmoid colon. The polyps       were 1 to 2 mm in size. These polyps were removed with a jumbo cold       forceps. Resection and retrieval were complete. Estimated blood loss was       minimal.      Two sessile polyps were found in the rectum. The polyps were 3 to 4 mm       in size. These polyps were removed with a cold snare. Resection and       retrieval were complete. Estimated blood loss was minimal.      A few small-mouthed diverticula were found in the sigmoid colon,       descending colon and transverse colon.      Internal hemorrhoids were found during retroflexion. The hemorrhoids       were Grade II (internal hemorrhoids that prolapse but reduce       spontaneously).      Anal papilla(e) were hypertrophied.      The exam was otherwise without abnormality on direct and retroflexion       views. Impression:            - Preparation of the colon was fair.                        - The examined portion of the ileum was normal.                        - A tattoo was seen at the hepatic flexure, in the                         ascending colon and in the distal ascending colon. The  tattoo site appeared normal.                        - One 10 mm polyp in the distal transverse colon,                         removed with a hot snare. Resected and retrieved.                        - Five 2 to 5 mm polyps at the recto-sigmoid colon,                         removed with a cold snare. Resected and retrieved.                        - Two 1 to 2 mm polyps at the recto-sigmoid colon,                         removed with a jumbo cold forceps. Resected and                         retrieved.                         - Two 3 to 4 mm polyps in the rectum, removed with a                         cold snare. Resected and retrieved.                        - Diverticulosis in the sigmoid colon, in the                         descending colon and in the transverse colon.                        - Internal hemorrhoids.                        - Anal papilla(e) were hypertrophied.                        - The examination was otherwise normal on direct and                         retroflexion views. Recommendation:        - Discharge patient to home.                        - Resume previous diet.                        - Resume Plavix (clopidogrel) at prior dose in 2 days.                        - Await pathology results.                        - Repeat colonoscopy in 1 year for surveillance.                        -  Return to referring physician as previously                         scheduled. Continue iron therapy. If no response after                         trial of 3 months then can consider video capsule                         study. Procedure Code(s):     --- Professional ---                        [phone], Colonoscopy, flexible; with removal of                         tumor(s), polyp(s), or other lesion(s) by snare                         technique                        [id number], 76, Colonoscopy, flexible; with biopsy, single                         or multiple Diagnosis Code(s):     --- Professional ---                        K64.1, Second degree hemorrhoids                        K63.5, Polyp of colon                        K62.1, Rectal polyp                        K62.89, Other specified diseases of anus and rectum                        D50.9, Iron deficiency anemia, unspecified                        K57.30, Diverticulosis of large intestine without                         perforation or abscess without bleeding CPT copyright 2019 American Medical Association. All rights  reserved. The codes documented in this report are preliminary and upon coder review may  be revised to meet current compliance requirements. Andrey Farmer MD, MD 03/16/2020 11:19:39 AM Number of Addenda: 0 Note Initiated On: 03/16/2020 10:05 AM Scope Withdrawal Time: 0 hours 27 minutes 9 seconds  Total Procedure Duration: 0 hours 32 minutes 57 seconds  Estimated Blood Loss:  Estimated blood loss was minimal.      [facility]

## 2020-03-16 NOTE — H&P (Signed)
Outpatient short stay form Pre-procedure 03/16/2020 10:14 AM Raylene Miyamoto MD, MPH  Primary Physician: Dr. Ouida Sills  Reason for visit:  IDA  History of present illness:   70 y/o gentleman with history of CAD and diagnosis of IDA. Has history of large polyp removed in 2017 with recommendation of repeat colonoscopy six months afterwards which he did not get done. No family history of GI malignancies. No abdominal surgeries. Has hernia.    Current Facility-Administered Medications:  .  0.9 %  sodium chloride infusion, , Intravenous, Continuous, Locklear, Hilton Cork, MD, Last Rate: 20 mL/hr at 03/16/20 1001, New Bag at 03/16/20 1001  Medications Prior to Admission  Medication Sig Dispense Refill Last Dose  . acetaminophen (TYLENOL) 500 MG tablet Take 1,000 mg by mouth every 8 (eight) hours as needed for mild pain.    Past Week at Unknown time  . COMBIVENT RESPIMAT 20-100 MCG/ACT AERS respimat Inhale 1 puff into the lungs See admin instructions. Takes 1 puff twice a day. Also uses it as a rescue inhaler 1 puff every 6 hours as needed.   03/15/2020 at 2100  . ferrous sulfate 325 (65 FE) MG EC tablet Take 1 tablet (325 mg total) by mouth in the morning and at bedtime. 60 tablet 11 Past Week at Unknown time  . isosorbide mononitrate (IMDUR) 30 MG 24 hr tablet TAKE 1 TABLET(30 MG) BY MOUTH DAILY 90 tablet 1 03/16/2020 at 0500  . nitroGLYCERIN (NITROSTAT) 0.4 MG SL tablet PLACE 1 TABLET UNDER THE TONGUE EVERY 5 MINUTES AS NEEDED FOR CHEST PAIN 25 tablet 2 Past Month at Unknown time  . pantoprazole (PROTONIX) 40 MG tablet Take 40 mg by mouth daily.   03/14/2020 at Unknown time  . PARoxetine (PAXIL) 40 MG tablet Take 40 mg by mouth daily.    03/15/2020 at 0800  . pravastatin (PRAVACHOL) 40 MG tablet Take 1 tablet (40 mg total) by mouth daily. 90 tablet 3 03/15/2020 at 0800  . ramipril (ALTACE) 10 MG capsule TAKE 1 CAPSULE(10 MG) BY MOUTH DAILY 90 capsule 1 03/16/2020 at 0500  . aspirin 81 MG chewable tablet  Chew 81 mg daily by mouth. (Patient not taking: Reported on 03/16/2020)   Not Taking at Unknown time  . clopidogrel (PLAVIX) 75 MG tablet TAKE 1 TABLET(75 MG) BY MOUTH DAILY 90 tablet 1 03/11/2020 at 0800  . predniSONE (STERAPRED UNI-PAK 21 TAB) 10 MG (21) TBPK tablet Take 6 pills on day one then decrease by 1 pill each day (Patient not taking: Reported on 03/16/2020) 21 tablet 0 Not Taking at Unknown time  . traMADol (ULTRAM) 50 MG tablet Take 1 tablet (50 mg total) by mouth every 6 (six) hours as needed. (Patient not taking: Reported on 03/16/2020) 15 tablet 0 Not Taking at Unknown time  . traZODone (DESYREL) 100 MG tablet Take 50 mg by mouth at bedtime as needed for sleep.    03/14/2020 at 2100     Allergies  Allergen Reactions  . Penicillins Anaphylaxis, Hives and Nausea And Vomiting    As a child Has patient had a PCN reaction causing immediate rash, facial/tongue/throat swelling, SOB or lightheadedness with hypotension: No Has patient had a PCN reaction causing severe rash involving mucus membranes or skin necrosis: No Has patient had a PCN reaction that required hospitalization No Has patient had a PCN reaction occurring within the last 10 years: No If all of the above answers are "NO", then may proceed with Cephalosporin use.      Past  Medical History:  Diagnosis Date  . Anxiety   . Asthma without status asthmaticus   . Atherosclerotic cerebrovascular disease    Overview:  Small and facial weakness with minimal weakness   . CAD (coronary artery disease)    a. 05/2010 Inf STEMI/PCI: RCA 178m (3.5x24 Promus DES), LCX small, 100, D1 50ost, D2 40ost, EF 45-50%; b. 03/2013 Cath ([facility]): Report not available - D/C summary says no significant dzs.  . Cervical disc disease    a. s/p C3-4 fusion 2003.  Marland Kitchen Cervical myelopathy ([place])    a. 09/2016 Admit w/ left sided wkns->found to have progressive cervical disc dzs above and below prior fusioin site; b. 10/2016 s/p ant cervical diskectomy.   Marland Kitchen COPD (chronic obstructive pulmonary disease) ([place])   . Cryptogenic stroke ([place])    a. 05/2013 s/p acute L MCA territory infarct; b. 07/2013 s/p MDT Linq ILR-->No AFib to documented to date (09/2016).  . Depression   . Diabetes mellitus without complication ([place])   . Essential (primary) hypertension   . Gastritis   . H/O insomnia   . Inguinal hernia   . Insomnia disorder   . Ischemic cardiomyopathy    a. 05/2010 LV gram: EF 45-50% @ time of inf MI; b. 09/2016 Echo: EF 50-55%, inf HK, Gr1 DD, mild MR.  . Mixed hyperlipidemia   . Nephrolithiasis   . Nephrolithiasis   . Obesity   . OSA (obstructive sleep apnea)   . Osteoarthritis   . STEMI (ST elevation myocardial infarction) ([place]) 06/07/2010  . Tobacco abuse   . Urinary incontinence   . Varicose veins     Review of systems:  Otherwise negative.    Physical Exam  Gen: Alert, oriented. Appears stated age.  HEENT: PERRLA. Lungs: No respiratory distress CV: RRR Abd: soft, benign, no masses Ext: No edema   Planned procedures: Proceed with EGD/colonoscopy. The patient understands the nature of the planned procedure, indications, risks, alternatives and potential complications including but not limited to bleeding, infection, perforation, damage to internal organs and possible oversedation/side effects from anesthesia. The patient agrees and gives consent to proceed.  Please refer to procedure notes for findings, recommendations and patient disposition/instructions.     Raylene Miyamoto MD, MPH Gastroenterology 03/16/2020  10:14 AM

## 2020-03-16 NOTE — Transfer of Care (Signed)
Immediate Anesthesia Transfer of Care Note  Patient: Louis Gutierrez  Procedure(s) Performed: COLONOSCOPY WITH PROPOFOL (N/A ) ESOPHAGOGASTRODUODENOSCOPY (EGD) WITH PROPOFOL (N/A )  Patient Location: PACU  Anesthesia Type:General  Level of Consciousness: drowsy  Airway & Oxygen Therapy: Patient Spontanous Breathing and Patient connected to nasal cannula oxygen  Post-op Assessment: Report given to RN and Post -op Vital signs reviewed and stable  Post vital signs: Reviewed and stable  Last Vitals:  Vitals Value Taken Time  BP    Temp    Pulse 94 03/16/20 1113  Resp 23 03/16/20 1113  SpO2 94 % 03/16/20 1113  Vitals shown include unvalidated device data.  Last Pain:  Vitals:   03/16/20 0946  TempSrc: Temporal  PainSc: 2          Complications: No complications documented.

## 2020-03-16 NOTE — Anesthesia Preprocedure Evaluation (Signed)
Anesthesia Evaluation  Patient identified by MRN, date of birth, ID band Patient awake    Reviewed: Allergy & Precautions, NPO status , Patient's Chart, lab work & pertinent test results  Airway Mallampati: II  TM Distance: >3 FB Neck ROM: Limited   Comment: C-spine fusion--C2-3--for radicular pain. Limited extension and flexion. Dental  (+) Missing, Loose, Poor Dentition, Dental Advisory Given,    Pulmonary asthma , sleep apnea and Continuous Positive Airway Pressure Ventilation , COPD,  COPD inhaler, Current Smoker, former smoker,  Pack per day for many years.    + decreased breath sounds      Cardiovascular Exercise Tolerance: Poor hypertension, Pt. on medications + CAD, + Past MI and + Peripheral Vascular Disease   Rhythm:Regular Rate:Normal  Hx of MI in 2012, and a CVA, on Plavix and ASA--held for these procedures.   Neuro/Psych PSYCHIATRIC DISORDERS Anxiety Depression C-spine fusion. CVA, No Residual Symptoms    GI/Hepatic negative GI ROS, Neg liver ROS,   Endo/Other  negative endocrine ROSdiabetes  Renal/GU Renal diseaseStones.  negative genitourinary   Musculoskeletal  (+) Arthritis , Osteoarthritis,    Abdominal (+)  Abdomen: soft.    Peds negative pediatric ROS (+)  Hematology negative hematology ROS (+)   Anesthesia Other Findings Past Medical History: No date: Asthma without status asthmaticus No date: Atherosclerotic cerebrovascular disease     Comment:  Overview:  Small and facial weakness with minimal               weakness  No date: CAD (coronary artery disease)     Comment:  a. 05/2010 Inf STEMI/PCI: RCA 123m (3.5x24 Promus DES),               LCX small, 100, D1 50ost, D2 40ost, EF 45-50%; b. 03/2013               Cath ([facility]): Report not available - D/C summary says              no significant dzs. No date: Cervical disc disease     Comment:  a. s/p C3-4 fusion 2003. No date: Cervical  myelopathy ([place])     Comment:  a. 09/2016 Admit w/ left sided wkns->found to have               progressive cervical disc dzs above and below prior               fusioin site. No date: COPD (chronic obstructive pulmonary disease) ([place]) No date: Cryptogenic stroke [facility])     Comment:  a. 05/2013 s/p acute L MCA territory infarct; b. 07/2013               s/p MDT Linq ILR-->No AFib to documented to date               (09/2016). No date: Depression No date: Essential (primary) hypertension No date: H/O insomnia No date: Inguinal hernia No date: Ischemic cardiomyopathy     Comment:  a. 05/2010 LV gram: EF 45-50% @ time of inf MI; b. 09/2016              Echo: EF 50-55%, inf HK, Gr1 DD, mild MR. No date: Mixed hyperlipidemia No date: Nephrolithiasis No date: Obesity No date: OSA (obstructive sleep apnea) No date: Osteoarthritis 06/07/2010: STEMI (ST elevation myocardial infarction) ([place]) No date: Tobacco abuse No date: Urinary incontinence No date: Varicose veins  Reproductive/Obstetrics  Anesthesia Physical  Anesthesia Plan  ASA: III  Anesthesia Plan: General   Post-op Pain Management:    Induction: Intravenous  PONV Risk Score and Plan: Propofol infusion  Airway Management Planned: Nasal Cannula  Additional Equipment:   Intra-op Plan:   Post-operative Plan:   Informed Consent: I have reviewed the patients History and Physical, chart, labs and discussed the procedure including the risks, benefits and alternatives for the proposed anesthesia with the patient or authorized representative who has indicated his/her understanding and acceptance.     Dental advisory given  Plan Discussed with: CRNA and Surgeon  Anesthesia Plan Comments:         Anesthesia Quick Evaluation

## 2020-03-16 NOTE — Op Note (Signed)
[facility] Gastroenterology Patient Name: Louis Gutierrez Procedure Date: 03/16/2020 10:05 AM MRN: [id number] Account #: [id number] Date of Birth: 1950-12-16 Admit Type: Outpatient Age: 70 Room: [facility] ENDO ROOM 3 Gender: Male Note Status: Finalized Procedure:             Upper GI endoscopy Indications:           Iron deficiency anemia Providers:             Andrey Farmer MD, MD Medicines:             Monitored Anesthesia Care Complications:         No immediate complications. Estimated blood loss:                         Minimal. Procedure:             Pre-Anesthesia Assessment:                        - Prior to the procedure, a History and Physical was                         performed, and patient medications and allergies were                         reviewed. The patient is competent. The risks and                         benefits of the procedure and the sedation options and                         risks were discussed with the patient. All questions                         were answered and informed consent was obtained.                         Patient identification and proposed procedure were                         verified by the physician, the nurse, the anesthetist                         and the technician in the endoscopy suite. Mental                         Status Examination: alert and oriented. Airway                         Examination: normal oropharyngeal airway and neck                         mobility. Respiratory Examination: clear to                         auscultation. CV Examination: normal. Prophylactic                         Antibiotics: The patient does not require prophylactic  antibiotics. Prior Anticoagulants: The patient has                         taken Plavix (clopidogrel), last dose was 6 days prior                         to procedure. ASA Grade Assessment: III - A patient                         with  severe systemic disease. After reviewing the                         risks and benefits, the patient was deemed in                         satisfactory condition to undergo the procedure. The                         anesthesia plan was to use monitored anesthesia care                         (MAC). Immediately prior to administration of                         medications, the patient was re-assessed for adequacy                         to receive sedatives. The heart rate, respiratory                         rate, oxygen saturations, blood pressure, adequacy of                         pulmonary ventilation, and response to care were                         monitored throughout the procedure. The physical                         status of the patient was re-assessed after the                         procedure.                        After obtaining informed consent, the endoscope was                         passed under direct vision. Throughout the procedure,                         the patient's blood pressure, pulse, and oxygen                         saturations were monitored continuously. The Endoscope                         was introduced through the mouth, and advanced to the  second part of duodenum. The upper GI endoscopy was                         accomplished without difficulty. The patient tolerated                         the procedure well. Findings:      The examined esophagus was normal.      Multiple 4 mm papules (nodules) with no stigmata of recent bleeding were       found in the gastric body and in the gastric antrum. Biopsies were taken       with a cold forceps for histology. Estimated blood loss was minimal.      The examined duodenum was normal. Impression:            - Normal esophagus.                        - Multiple papules (nodules) found in the stomach.                         Biopsied.                        - Normal examined  duodenum. Recommendation:        - Discharge patient to home.                        - Resume previous diet.                        - Continue present medications.                        - Await pathology results.                        - Perform a colonoscopy as previously scheduled. Procedure Code(s):     --- Professional ---                        [phone], Esophagogastroduodenoscopy, flexible,                         transoral; with biopsy, single or multiple Diagnosis Code(s):     --- Professional ---                        K31.89, Other diseases of stomach and duodenum                        D50.9, Iron deficiency anemia, unspecified CPT copyright 2019 American Medical Association. All rights reserved. The codes documented in this report are preliminary and upon coder review may  be revised to meet current compliance requirements. Andrey Farmer MD, MD 03/16/2020 11:11:11 AM Number of Addenda: 0 Note Initiated On: 03/16/2020 10:05 AM Estimated Blood Loss:  Estimated blood loss was minimal.      [facility]

## 2020-03-18 NOTE — Anesthesia Postprocedure Evaluation (Signed)
Anesthesia Post Note  Patient: Louis Gutierrez  Procedure(s) Performed: COLONOSCOPY WITH PROPOFOL (N/A ) ESOPHAGOGASTRODUODENOSCOPY (EGD) WITH PROPOFOL (N/A )  Patient location during evaluation: Endoscopy Anesthesia Type: General Level of consciousness: awake and alert and oriented Pain management: pain level controlled Vital Signs Assessment: post-procedure vital signs reviewed and stable Respiratory status: spontaneous breathing Cardiovascular status: blood pressure returned to baseline Anesthetic complications: no   No complications documented.   Last Vitals:  Vitals:   03/16/20 1140 03/16/20 1150  BP: (!) 146/87 (!) 153/90  Pulse: 85 80  Resp: (!) 22 (!) 22  Temp:    SpO2: 95% 92%    Last Pain:  Vitals:   03/17/20 1134  TempSrc:   PainSc: 0-No pain                 CARROLL,PAUL

## 2020-03-19 ENCOUNTER — Encounter: Payer: Self-pay | Admitting: Gastroenterology

## 2020-03-20 LAB — SURGICAL PATHOLOGY

## 2020-04-17 ENCOUNTER — Other Ambulatory Visit: Payer: Self-pay | Admitting: Cardiovascular Disease

## 2020-04-26 DIAGNOSIS — E119 Type 2 diabetes mellitus without complications: Secondary | ICD-10-CM | POA: Diagnosis not present

## 2020-04-26 DIAGNOSIS — D509 Iron deficiency anemia, unspecified: Secondary | ICD-10-CM | POA: Diagnosis not present

## 2020-04-26 DIAGNOSIS — I10 Essential (primary) hypertension: Secondary | ICD-10-CM | POA: Diagnosis not present

## 2020-04-26 DIAGNOSIS — I25119 Atherosclerotic heart disease of native coronary artery with unspecified angina pectoris: Secondary | ICD-10-CM | POA: Diagnosis not present

## 2020-05-03 DIAGNOSIS — F325 Major depressive disorder, single episode, in full remission: Secondary | ICD-10-CM | POA: Diagnosis not present

## 2020-05-03 DIAGNOSIS — I110 Hypertensive heart disease with heart failure: Secondary | ICD-10-CM | POA: Diagnosis not present

## 2020-05-03 DIAGNOSIS — I25119 Atherosclerotic heart disease of native coronary artery with unspecified angina pectoris: Secondary | ICD-10-CM | POA: Diagnosis not present

## 2020-05-03 DIAGNOSIS — K4090 Unilateral inguinal hernia, without obstruction or gangrene, not specified as recurrent: Secondary | ICD-10-CM | POA: Diagnosis not present

## 2020-05-03 DIAGNOSIS — J449 Chronic obstructive pulmonary disease, unspecified: Secondary | ICD-10-CM | POA: Diagnosis not present

## 2020-05-03 DIAGNOSIS — R42 Dizziness and giddiness: Secondary | ICD-10-CM | POA: Diagnosis not present

## 2020-05-03 DIAGNOSIS — I10 Essential (primary) hypertension: Secondary | ICD-10-CM | POA: Diagnosis not present

## 2020-05-03 DIAGNOSIS — E119 Type 2 diabetes mellitus without complications: Secondary | ICD-10-CM | POA: Diagnosis not present

## 2020-05-03 DIAGNOSIS — I509 Heart failure, unspecified: Secondary | ICD-10-CM | POA: Diagnosis not present

## 2020-05-10 ENCOUNTER — Encounter: Payer: Self-pay | Admitting: Cardiovascular Disease

## 2020-05-10 ENCOUNTER — Ambulatory Visit: Payer: Self-pay | Admitting: General Surgery

## 2020-05-10 DIAGNOSIS — K4020 Bilateral inguinal hernia, without obstruction or gangrene, not specified as recurrent: Secondary | ICD-10-CM | POA: Diagnosis not present

## 2020-05-10 DIAGNOSIS — J449 Chronic obstructive pulmonary disease, unspecified: Secondary | ICD-10-CM | POA: Diagnosis not present

## 2020-05-10 DIAGNOSIS — E119 Type 2 diabetes mellitus without complications: Secondary | ICD-10-CM | POA: Diagnosis not present

## 2020-05-10 DIAGNOSIS — I25119 Atherosclerotic heart disease of native coronary artery with unspecified angina pectoris: Secondary | ICD-10-CM | POA: Diagnosis not present

## 2020-05-10 NOTE — H&P (Signed)
PATIENT PROFILE: Louis Gutierrez is a 70 y.o. male who presents to the Clinic for consultation at the request of Dr. Ouida Sills for evaluation of inguinal hernia.  PCP:  Harrold Donath, MD  HISTORY OF PRESENT ILLNESS: Mr. Boeding reports feeling a left inguinal hernia since 2 years ago.  He reported that in the last couple of months he has been aggravating.  He reported that the hernia is getting more painful.  The pain is localized to the left groin.  The pain is aggravated with bending and certain abdominal wall movement.  There has been no alleviating factors identified.  He also feels a bulge when he needs to strain.  He denies any abdominal distention or nausea or vomiting.  He denies any fever chills.  He denies any abdominal surgeries.   PROBLEM LIST:         Problem List  Date Reviewed: 07/22/2019         Noted   Adrenal adenoma, left 08/28/2017   Healthcare maintenance 01/07/2016   Overview    Flu yearly, pneumovax at [place] 2015, prevnar 13 rx 12-17 and 6-18, colon frequent with polyp, walking and dm diet. Full code, ct scans year and dc tob  MEDICARE WELLNESS VISIT   PROVIDERS RENDERING CARE Dr. Ouida Sills, [facility] gastroenterology, [facility] cardiology, Dr. Fleet Contras  FUNCTIONAL ASSESSMENT  (1) Hearing: Demonstrates normal hearing in conversation.  (2) Risk of Falls: No reports of falls or abnormal balance. Gait is observed to be good upon observation.  (3) Home Safety; Home is safe and secure (4) Activities of Daily Living; Household chores and grooming are managed without problems. Personal finances are managed without problems.   DEPRESSION SCREENING There does not seem to be loss of interest in activities nor excess crying or changes in sleep or appetite.   COGNITIVE SCREENING Orientation is appropriate as are responses to questions and general conversation. No reports of forgetfulness or losing things.    PREVENTION PLAN Cardiovascular: Followed closely  with disease Diabetes: followed with disease on diet Colon Cancer: history of polyps and last colonoscopy 4-17 Glaucoma: Yearly eye exam Pneumonia: Pneumovax 2015 and prevnar 13 prescription 12-17 and again 6-18 Shingles: Shingrix 6-19 Influenza: Yearly flu vaccine each fall Smoking Cessation: Working on modulation and cessation. Ct scans are being obtained   OTHER PERSONALIZED HEALTH ADVISE Active at work and diabetic diet.  END OF LIFE CARE WANTS Full Code     Frazier Richards MD              Coronary artery disease involving native coronary artery of native heart with angina pectoris (CMS-HCC) 06/09/2014   Overview    With stent on plavix and asa       COPD mixed type , unspecified (CMS-HCC) 06/09/2014   Overview    Ct handout 6-18 and 12-18       Major depression in remission (CMS-HCC) 06/09/2014   Essential hypertension with goal blood pressure less than 140/90 06/09/2014   Atherosclerotic cerebrovascular disease 06/09/2014   Overview    Small and facial weakness with minimal weakness       Pseudogout 06/09/2014   DM II (diabetes mellitus, type II), controlled (CMS-HCC) 06/09/2014   Primary osteoarthritis of both knees 04/20/2014      GENERAL REVIEW OF SYSTEMS:   General ROS: negative for - chills, fatigue, fever, weight gain or weight loss Allergy and Immunology ROS: negative for - hives  Hematological and Lymphatic ROS: Positive for - bleeding problems or bruising, negative  for palpable nodes Endocrine ROS: negative for - heat or cold intolerance, hair changes Respiratory ROS: Positive for - cough, shortness of breath or wheezing Cardiovascular ROS: no chest pain or palpitations GI ROS: negative for nausea, vomiting, abdominal pain, diarrhea, constipation Musculoskeletal ROS: negative for - joint swelling or muscle pain Neurological ROS: Positive for - confusion, syncope Dermatological ROS: negative for pruritus  and rash Psychiatric: negative for anxiety, depression, positive for difficulty sleeping and memory loss  MEDICATIONS: Current Medications        Current Outpatient Medications  Medication Sig Dispense Refill  . acetaminophen (TYLENOL) 500 MG tablet Take 500 mg by mouth as needed.    Marland Kitchen aspirin 81 MG EC tablet Take 81 mg by mouth once daily.    . clopidogrel (PLAVIX) 75 mg tablet Take 75 mg by mouth once daily.    . ferrous sulfate 325 (65 FE) MG EC tablet Take 1 tablet by mouth 2 (two) times daily    . ipratropium-albuteroL (COMBIVENT RESPIMAT) 20-100 mcg/actuation inhaler Inhale 1 inhalation into the lungs 4 (four) times daily as needed for Wheezing 12 g 11  . isosorbide mononitrate (IMDUR) 30 MG ER tablet Take 1 tablet by mouth once daily.    . nitroGLYcerin (NITROSTAT) 0.4 MG SL tablet Place 1 tablet under the tongue as needed.    . pantoprazole (PROTONIX) 40 MG DR tablet TAKE 1 TABLET(40 MG) BY MOUTH EVERY DAY 90 tablet 1  . PARoxetine (PAXIL) 40 MG tablet TAKE 1 TABLET(40 MG) BY MOUTH EVERY DAY 90 tablet 1  . pravastatin (PRAVACHOL) 40 MG tablet Take 40 mg by mouth nightly.    . ramipril (ALTACE) 10 MG capsule Take 10 mg by mouth once daily.    . traZODone (DESYREL) 100 MG tablet TAKE 1 TABLET(100 MG) BY MOUTH EVERY NIGHT 90 tablet 1  . sodium, potassium, and magnesium (SUPREP) oral solution Take 1 Bottle by mouth as directed One kit contains 2 bottles.  Take both bottles at the times instructed by your provider. (Patient not taking: No sig reported) 354 mL 0   No current facility-administered medications for this visit.      ALLERGIES: Penicillin  PAST MEDICAL HISTORY:     Past Medical History:  Diagnosis Date  . Allergy 10/01/60   penicillan  . Anxiety   . Asthma without status asthmaticus, unspecified   . CAD (coronary artery disease)   . COPD (chronic obstructive pulmonary disease) (CMS-HCC)   . CVA (cerebral vascular accident) (CMS-HCC)   .  Depression   . DM II (diabetes mellitus, type II), controlled (CMS-HCC)   . Gastritis 02/26/2015  . Hx of insomnia   . Hypertension   . Myocardial infarction (CMS-HCC)   . Nephrolithiasis   . Osteoarthritis   . Serrated adenoma of colon, unspecified 02/26/2015  . Sleep apnea   . Urinary incontinence   . Varicose veins     PAST SURGICAL HISTORY:      Past Surgical History:  Procedure Laterality Date  . ACDF C3-4 C6-7  10/22/2016   Dr Meade Maw  . COLONOSCOPY  02/26/2015   Serrated adenoma of colon/Repeat 85yrMGR  . COLONOSCOPY  03/16/2020   Tubular adenoma/Hyperplastic polyps/Fair colon prep/Repeat 147yrTL  . EGD  02/26/2015   Gastritis/Intestinal Metaplasia/Repeat 3y62yrGR  . EGD Bilateral 03/16/2020   Gastritis/Repeat as needed/CTL     FAMILY HISTORY:      Family History  Problem Relation Age of Onset  . Stroke Brother   . Heart disease Brother   .  Asthma Brother   . Diabetes Brother   . High blood pressure (Hypertension) Brother   . Heart disease Maternal Uncle   . Colon cancer Neg Hx   . Colon polyps Neg Hx      SOCIAL HISTORY: Social History          Socioeconomic History  . Marital status: Married  Tobacco Use  . Smoking status: Former Smoker    Packs/day: 1.00    Years: 53.00    Pack years: 53.00    Types: Cigarettes  . Smokeless tobacco: Never Used  Vaping Use  . Vaping Use: Former  Substance and Sexual Activity  . Alcohol use: Never    Alcohol/week: 0.0 standard drinks  . Drug use: No  . Sexual activity: Not Currently    Partners: Female      PHYSICAL EXAM:    Vitals:   05/10/20 0815  BP: (!) 144/83  Pulse: 84   Body mass index is 28.87 kg/m. Weight: 93.9 kg (207 lb)   GENERAL: Alert, active, oriented x3  HEENT: Pupils equal reactive to light. Extraocular movements are intact. Sclera clear. Palpebral conjunctiva normal red color.Pharynx clear.  NECK: Supple with no  palpable mass and no adenopathy.  LUNGS: Sound clear with no rales rhonchi or wheezes.  HEART: Regular rhythm S1 and S2 without murmur.  ABDOMEN: Soft and depressible, nontender with no palpable mass, no hepatomegaly.  Palpable left inguinal hernia on Valsalva.  Tender on palpation on the right groin.  EXTREMITIES: Well-developed well-nourished symmetrical with no dependent edema.  NEUROLOGICAL: Awake alert oriented, facial expression symmetrical, moving all extremities.  REVIEW OF DATA: I have reviewed the following data today:      Appointment on 04/26/2020  Component Date Value  . Glucose 04/26/2020 138 (!)  . Sodium 04/26/2020 140   . Potassium 04/26/2020 4.8   . Chloride 04/26/2020 103   . Carbon Dioxide (CO2) 04/26/2020 31.6   . Calcium 04/26/2020 9.5   . Urea Nitrogen (BUN) 04/26/2020 17   . Creatinine 04/26/2020 1.1   . Glomerular Filtration Ra* 04/26/2020 66   . BUN/Crea Ratio 04/26/2020 15.5   . Anion Gap w/K 04/26/2020 10.2   . WBC (White Blood Cell Co* 04/26/2020 6.0   . RBC (Red Blood Cell Coun* 04/26/2020 5.27   . Hemoglobin 04/26/2020 14.0 (!)  . Hematocrit 04/26/2020 46.0   . MCV (Mean Corpuscular Vo* 04/26/2020 87.3   . MCH (Mean Corpuscular He* 04/26/2020 26.6 (!)  . MCHC (Mean Corpuscular H* 04/26/2020 30.4 (!)  . Platelet Count 04/26/2020 283   . RDW-CV (Red Cell Distrib* 04/26/2020 15.5 (!)  . MPV (Mean Platelet Volum* 04/26/2020 9.4   . Neutrophils 04/26/2020 3.98   . Lymphocytes 04/26/2020 1.30   . Monocytes 04/26/2020 0.55   . Eosinophils 04/26/2020 0.13   . Basophils 04/26/2020 0.05   . Neutrophil % 04/26/2020 66.1   . Lymphocyte % 04/26/2020 21.6   . Monocyte % 04/26/2020 9.1   . Eosinophil % 04/26/2020 2.2   . Basophil% 04/26/2020 0.8   . Immature Granulocyte % 04/26/2020 0.2   . Immature Granulocyte Cou* 04/26/2020 0.01   . Hemoglobin A1C 04/26/2020 6.5 (!)  . Average Blood Glucose (C* 04/26/2020 140   . Protein, Total 04/26/2020  7.1   . Albumin 04/26/2020 4.1   . Bilirubin, Total 04/26/2020 0.6   . Bilirubin, Conjugated 04/26/2020 0.15   . Alk Phos (alkaline Phosp* 04/26/2020 133 (!)  . AST  04/26/2020 13   .  ALT  04/26/2020 9   . Cholesterol, Total 04/26/2020 124   . Triglyceride 04/26/2020 100   . HDL (High Density Lipopr* 04/26/2020 38.8   . LDL Calculated 04/26/2020 65   . VLDL Cholesterol 04/26/2020 20   . Cholesterol/HDL Ratio 04/26/2020 3.2   . Urine Albumin, Random 04/26/2020 <7   . Iron 04/26/2020 36 (!)  . Total Iron Binding Capac* 04/26/2020 495.9 (!)  . Transferrin 04/26/2020 354.2   . % Saturation 04/26/2020 7   . Ferritin 04/26/2020 7 (!)     ASSESSMENT: Mr. Ungaro is a 70 y.o. male presenting for consultation for left persistent bilateral inguinal hernia.    The patient presents with a symptomatic, reducible left inguinal hernia with tenderness on palpation of the right groin. Patient was oriented about the diagnosis of inguinal hernia and its implication. The patient was oriented about the treatment alternatives (observation vs surgical repair). Due to patient symptoms, repair is recommended. Patient oriented about the surgical procedure, the use of mesh and its risk of complications such as: infection, bleeding, injury to vas deference, vasculature and testicle, injury to bowel or bladder, and chronic pain.  Patient with stable COPD without any recent exacerbation.  He has coronary artery disease with an MI 7 years ago and had stent placement.  He is on aspirin and Plavix.  Recommended the patient to hold Plavix for 7 days and aspirin for 5 days.  We will get cardiac clearance for surgery.  He has controlled diabetes mellitus.  We will continue with current medications.  Non-recurrent bilateral inguinal hernia without obstruction or gangrene [K40.20]  PLAN: 1. Robotic assisted laparoscopic left vs bilateral inguinal hernia repair with mesh (25956) 2.  CBC, CMP (done) 3.  Avoid taking  aspirin 5 days before procedure 4.  Hold Plavis 7 days before surgery 5.  Cardiac Clearance 6.  Contact us if has any question or concern.   Patient verbalized understanding, all questions were answered, and were agreeable with the plan outlined above.    Herbert Pun, MD  Electronically signed by Herbert Pun, MD

## 2020-05-10 NOTE — H&P (View-Only) (Signed)
PATIENT PROFILE: Louis Gutierrez is a 70 y.o. male who presents to the Clinic for consultation at the request of Dr. Ouida Sills for evaluation of inguinal hernia.  PCP:  Harrold Donath, MD  HISTORY OF PRESENT ILLNESS: [name] reports feeling a left inguinal hernia since 2 years ago.  He reported that in the last couple of months he has been aggravating.  He reported that the hernia is getting more painful.  The pain is localized to the left groin.  The pain is aggravated with bending and certain abdominal wall movement.  There has been no alleviating factors identified.  He also feels a bulge when he needs to strain.  He denies any abdominal distention or nausea or vomiting.  He denies any fever chills.  He denies any abdominal surgeries.   PROBLEM LIST:         Problem List  Date Reviewed: 07/22/2019         Noted   Adrenal adenoma, left 08/28/2017   Healthcare maintenance 01/07/2016   Overview    Flu yearly, pneumovax at [place] 2015, prevnar 13 rx 12-17 and 6-18, colon frequent with polyp, walking and dm diet. Full code, ct scans year and dc tob  MEDICARE WELLNESS VISIT   PROVIDERS RENDERING CARE Dr. Ouida Sills, [facility] gastroenterology, [facility] cardiology, Dr. Fleet Contras  FUNCTIONAL ASSESSMENT  (1) Hearing: Demonstrates normal hearing in conversation.  (2) Risk of Falls: No reports of falls or abnormal balance. Gait is observed to be good upon observation.  (3) Home Safety; Home is safe and secure (4) Activities of Daily Living; Household chores and grooming are managed without problems. Personal finances are managed without problems.   DEPRESSION SCREENING There does not seem to be loss of interest in activities nor excess crying or changes in sleep or appetite.   COGNITIVE SCREENING Orientation is appropriate as are responses to questions and general conversation. No reports of forgetfulness or losing things.    PREVENTION PLAN Cardiovascular: Followed closely  with disease Diabetes: followed with disease on diet Colon Cancer: history of polyps and last colonoscopy 4-17 Glaucoma: Yearly eye exam Pneumonia: Pneumovax 2015 and prevnar 13 prescription 12-17 and again 6-18 Shingles: Shingrix 6-19 Influenza: Yearly flu vaccine each fall Smoking Cessation: Working on modulation and cessation. Ct scans are being obtained   OTHER PERSONALIZED HEALTH ADVISE Active at work and diabetic diet.  END OF LIFE CARE WANTS Full Code     Frazier Richards MD              Coronary artery disease involving native coronary artery of native heart with angina pectoris (CMS-HCC) 06/09/2014   Overview    With stent on plavix and asa       COPD mixed type , unspecified (CMS-HCC) 06/09/2014   Overview    Ct handout 6-18 and 12-18       Major depression in remission (CMS-HCC) 06/09/2014   Essential hypertension with goal blood pressure less than 140/90 06/09/2014   Atherosclerotic cerebrovascular disease 06/09/2014   Overview    Small and facial weakness with minimal weakness       Pseudogout 06/09/2014   DM II (diabetes mellitus, type II), controlled (CMS-HCC) 06/09/2014   Primary osteoarthritis of both knees 04/20/2014      GENERAL REVIEW OF SYSTEMS:   General ROS: negative for - chills, fatigue, fever, weight gain or weight loss Allergy and Immunology ROS: negative for - hives  Hematological and Lymphatic ROS: Positive for - bleeding problems or bruising, negative  for palpable nodes Endocrine ROS: negative for - heat or cold intolerance, hair changes Respiratory ROS: Positive for - cough, shortness of breath or wheezing Cardiovascular ROS: no chest pain or palpitations GI ROS: negative for nausea, vomiting, abdominal pain, diarrhea, constipation Musculoskeletal ROS: negative for - joint swelling or muscle pain Neurological ROS: Positive for - confusion, syncope Dermatological ROS: negative for pruritus  and rash Psychiatric: negative for anxiety, depression, positive for difficulty sleeping and memory loss  MEDICATIONS: Current Medications        Current Outpatient Medications  Medication Sig Dispense Refill  . acetaminophen (TYLENOL) 500 MG tablet Take 500 mg by mouth as needed.    Marland Kitchen aspirin 81 MG EC tablet Take 81 mg by mouth once daily.    . clopidogrel (PLAVIX) 75 mg tablet Take 75 mg by mouth once daily.    . ferrous sulfate 325 (65 FE) MG EC tablet Take 1 tablet by mouth 2 (two) times daily    . ipratropium-albuteroL (COMBIVENT RESPIMAT) 20-100 mcg/actuation inhaler Inhale 1 inhalation into the lungs 4 (four) times daily as needed for Wheezing 12 g 11  . isosorbide mononitrate (IMDUR) 30 MG ER tablet Take 1 tablet by mouth once daily.    . nitroGLYcerin (NITROSTAT) 0.4 MG SL tablet Place 1 tablet under the tongue as needed.    . pantoprazole (PROTONIX) 40 MG DR tablet TAKE 1 TABLET(40 MG) BY MOUTH EVERY DAY 90 tablet 1  . PARoxetine (PAXIL) 40 MG tablet TAKE 1 TABLET(40 MG) BY MOUTH EVERY DAY 90 tablet 1  . pravastatin (PRAVACHOL) 40 MG tablet Take 40 mg by mouth nightly.    . ramipril (ALTACE) 10 MG capsule Take 10 mg by mouth once daily.    . traZODone (DESYREL) 100 MG tablet TAKE 1 TABLET(100 MG) BY MOUTH EVERY NIGHT 90 tablet 1  . sodium, potassium, and magnesium (SUPREP) oral solution Take 1 Bottle by mouth as directed One kit contains 2 bottles.  Take both bottles at the times instructed by your provider. (Patient not taking: No sig reported) 354 mL 0   No current facility-administered medications for this visit.      ALLERGIES: Penicillin  PAST MEDICAL HISTORY:     Past Medical History:  Diagnosis Date  . Allergy 10/01/60   penicillan  . Anxiety   . Asthma without status asthmaticus, unspecified   . CAD (coronary artery disease)   . COPD (chronic obstructive pulmonary disease) (CMS-HCC)   . CVA (cerebral vascular accident) (CMS-HCC)   .  Depression   . DM II (diabetes mellitus, type II), controlled (CMS-HCC)   . Gastritis 02/26/2015  . Hx of insomnia   . Hypertension   . Myocardial infarction (CMS-HCC)   . Nephrolithiasis   . Osteoarthritis   . Serrated adenoma of colon, unspecified 02/26/2015  . Sleep apnea   . Urinary incontinence   . Varicose veins     PAST SURGICAL HISTORY:      Past Surgical History:  Procedure Laterality Date  . ACDF C3-4 C6-7  10/22/2016   Dr Meade Maw  . COLONOSCOPY  02/26/2015   Serrated adenoma of colon/Repeat 1yrMGR  . COLONOSCOPY  03/16/2020   Tubular adenoma/Hyperplastic polyps/Fair colon prep/Repeat 145yrTL  . EGD  02/26/2015   Gastritis/Intestinal Metaplasia/Repeat 3y15yrGR  . EGD Bilateral 03/16/2020   Gastritis/Repeat as needed/CTL     FAMILY HISTORY:      Family History  Problem Relation Age of Onset  . Stroke Brother   . Heart disease Brother   .  Asthma Brother   . Diabetes Brother   . High blood pressure (Hypertension) Brother   . Heart disease Maternal Uncle   . Colon cancer Neg Hx   . Colon polyps Neg Hx      SOCIAL HISTORY: Social History          Socioeconomic History  . Marital status: Married  Tobacco Use  . Smoking status: Former Smoker    Packs/day: 1.00    Years: 53.00    Pack years: 53.00    Types: Cigarettes  . Smokeless tobacco: Never Used  Vaping Use  . Vaping Use: Former  Substance and Sexual Activity  . Alcohol use: Never    Alcohol/week: 0.0 standard drinks  . Drug use: No  . Sexual activity: Not Currently    Partners: Female      PHYSICAL EXAM:    Vitals:   05/10/20 0815  BP: (!) 144/83  Pulse: 84   Body mass index is 28.87 kg/m. Weight: 93.9 kg (207 lb)   GENERAL: Alert, active, oriented x3  HEENT: Pupils equal reactive to light. Extraocular movements are intact. Sclera clear. Palpebral conjunctiva normal red color.Pharynx clear.  NECK: Supple with no  palpable mass and no adenopathy.  LUNGS: Sound clear with no rales rhonchi or wheezes.  HEART: Regular rhythm S1 and S2 without murmur.  ABDOMEN: Soft and depressible, nontender with no palpable mass, no hepatomegaly.  Palpable left inguinal hernia on Valsalva.  Tender on palpation on the right groin.  EXTREMITIES: Well-developed well-nourished symmetrical with no dependent edema.  NEUROLOGICAL: Awake alert oriented, facial expression symmetrical, moving all extremities.  REVIEW OF DATA: I have reviewed the following data today:      Appointment on 04/26/2020  Component Date Value  . Glucose 04/26/2020 138 (!)  . Sodium 04/26/2020 140   . Potassium 04/26/2020 4.8   . Chloride 04/26/2020 103   . Carbon Dioxide (CO2) 04/26/2020 31.6   . Calcium 04/26/2020 9.5   . Urea Nitrogen (BUN) 04/26/2020 17   . Creatinine 04/26/2020 1.1   . Glomerular Filtration Ra* 04/26/2020 66   . BUN/Crea Ratio 04/26/2020 15.5   . Anion Gap w/K 04/26/2020 10.2   . WBC (White Blood Cell Co* 04/26/2020 6.0   . RBC (Red Blood Cell Coun* 04/26/2020 5.27   . Hemoglobin 04/26/2020 14.0 (!)  . Hematocrit 04/26/2020 46.0   . MCV (Mean Corpuscular Vo* 04/26/2020 87.3   . MCH (Mean Corpuscular He* 04/26/2020 26.6 (!)  . MCHC (Mean Corpuscular H* 04/26/2020 30.4 (!)  . Platelet Count 04/26/2020 283   . RDW-CV (Red Cell Distrib* 04/26/2020 15.5 (!)  . MPV (Mean Platelet Volum* 04/26/2020 9.4   . Neutrophils 04/26/2020 3.98   . Lymphocytes 04/26/2020 1.30   . Monocytes 04/26/2020 0.55   . Eosinophils 04/26/2020 0.13   . Basophils 04/26/2020 0.05   . Neutrophil % 04/26/2020 66.1   . Lymphocyte % 04/26/2020 21.6   . Monocyte % 04/26/2020 9.1   . Eosinophil % 04/26/2020 2.2   . Basophil% 04/26/2020 0.8   . Immature Granulocyte % 04/26/2020 0.2   . Immature Granulocyte Cou* 04/26/2020 0.01   . Hemoglobin A1C 04/26/2020 6.5 (!)  . Average Blood Glucose (C* 04/26/2020 140   . Protein, Total 04/26/2020  7.1   . Albumin 04/26/2020 4.1   . Bilirubin, Total 04/26/2020 0.6   . Bilirubin, Conjugated 04/26/2020 0.15   . Alk Phos (alkaline Phosp* 04/26/2020 133 (!)  . AST  04/26/2020 13   .  ALT  04/26/2020 9   . Cholesterol, Total 04/26/2020 124   . Triglyceride 04/26/2020 100   . HDL (High Density Lipopr* 04/26/2020 38.8   . LDL Calculated 04/26/2020 65   . VLDL Cholesterol 04/26/2020 20   . Cholesterol/HDL Ratio 04/26/2020 3.2   . Urine Albumin, Random 04/26/2020 <7   . Iron 04/26/2020 36 (!)  . Total Iron Binding Capac* 04/26/2020 495.9 (!)  . Transferrin 04/26/2020 354.2   . % Saturation 04/26/2020 7   . Ferritin 04/26/2020 7 (!)     ASSESSMENT: Mr. Schauer is a 70 y.o. male presenting for consultation for left persistent bilateral inguinal hernia.    The patient presents with a symptomatic, reducible left inguinal hernia with tenderness on palpation of the right groin. Patient was oriented about the diagnosis of inguinal hernia and its implication. The patient was oriented about the treatment alternatives (observation vs surgical repair). Due to patient symptoms, repair is recommended. Patient oriented about the surgical procedure, the use of mesh and its risk of complications such as: infection, bleeding, injury to vas deference, vasculature and testicle, injury to bowel or bladder, and chronic pain.  Patient with stable COPD without any recent exacerbation.  He has coronary artery disease with an MI 7 years ago and had stent placement.  He is on aspirin and Plavix.  Recommended the patient to hold Plavix for 7 days and aspirin for 5 days.  We will get cardiac clearance for surgery.  He has controlled diabetes mellitus.  We will continue with current medications.  Non-recurrent bilateral inguinal hernia without obstruction or gangrene [K40.20]  PLAN: 1. Robotic assisted laparoscopic left vs bilateral inguinal hernia repair with mesh (95621) 2.  CBC, CMP (done) 3.  Avoid taking  aspirin 5 days before procedure 4.  Hold Plavis 7 days before surgery 5.  Cardiac Clearance 6.  Contact us if has any question or concern.   Patient verbalized understanding, all questions were answered, and were agreeable with the plan outlined above.    Herbert Pun, MD  Electronically signed by Herbert Pun, MD

## 2020-05-11 ENCOUNTER — Telehealth: Payer: Self-pay

## 2020-05-11 NOTE — Telephone Encounter (Signed)
   Name: Louis Gutierrez  DOB: [birth year]  MRN: [id number]   Primary Cardiologist: Sanda Klein, MD  Chart reviewed as part of pre-operative protocol coverage. Patient was contacted 05/11/2020 in reference to pre-operative risk assessment for pending surgery as outlined below.  Louis Gutierrez was last seen on 12/22/2019 by Dr. Sallyanne Kuster.  Since that day, Louis Gutierrez has done fine from a cardiac standpoint. He has chronic DOE which is unchanged over the past several months. He can complete 4 METs without anginal complaints.  Therefore, based on ACC/AHA guidelines, the patient would be at acceptable risk for the planned procedure without further cardiovascular testing.   The patient was advised that if he develops new symptoms prior to surgery to contact our office to arrange for a follow-up visit, and he verbalized understanding.  Per previous recommendations and given lack of interval change in cardiac history, patient can hold aspirin and plavix 5-7 days prior to his upcoming procedure with plans to restart as soon as he is cleared to do so by his surgeon.   I will route this recommendation to the requesting party via Epic fax function and remove from pre-op pool. Please call with questions.  Abigail Butts, PA-C 05/11/2020, 1:50 PM

## 2020-05-11 NOTE — Telephone Encounter (Signed)
   [facility] Pre-operative Risk Assessment    Patient Name: Louis Gutierrez  DOB: Jan 18, 1951  MRN: [id number]   HEARTCARE STAFF: - Please ensure there is not already an duplicate clearance open for this procedure. - Under Visit Info/Reason for Call, type in Other and utilize the format Clearance MM/DD/YY or Clearance TBD. Do not use dashes or single digits. - If request is for dental extraction, please clarify the # of teeth to be extracted.  Request for surgical clearance:  1. What type of surgery is being performed? Inguinal Hernia   2. When is this surgery scheduled? 05/23/20   3. What type of clearance is required (medical clearance vs. Pharmacy clearance to hold med vs. Both)?  Both   4. Are there any medications that need to be held prior to surgery and how long? ASA, Plavix for 7 days prior    5. Practice name and name of physician performing surgery? [facility],  Dr. Peyton Najjar   6. What is the office phone number? [phone]   7.   What is the office fax number? [phone]  8.   Anesthesia type (None, local, MAC, general) ? Not listed    Jacqulynn Cadet 05/11/2020, 10:38 AM  _________________________________________________________________   (provider comments below)

## 2020-05-17 ENCOUNTER — Other Ambulatory Visit: Payer: Self-pay

## 2020-05-17 ENCOUNTER — Encounter
Admission: RE | Admit: 2020-05-17 | Discharge: 2020-05-17 | Disposition: A | Discharge status: Home / Self Care | Payer: Medicare HMO | Source: Ambulatory Visit | Attending: General Surgery | Admitting: General Surgery

## 2020-05-17 DIAGNOSIS — Z01818 Encounter for other preprocedural examination: Secondary | ICD-10-CM | POA: Insufficient documentation

## 2020-05-17 HISTORY — DX: Anemia, unspecified: D64.9

## 2020-05-17 HISTORY — DX: Gastro-esophageal reflux disease without esophagitis: K21.9

## 2020-05-17 HISTORY — DX: Dizziness and giddiness: R42

## 2020-05-17 HISTORY — DX: Personal history of urinary calculi: Z87.442

## 2020-05-17 NOTE — Patient Instructions (Signed)
INSTRUCTIONS FOR SURGERY     Your surgery is scheduled for:   Wednesday, MAY 4TH     To find out your arrival time for the day of surgery,          please call [phone] between 1 pm and 3 pm on :  Tuesday, MAY 3RD     When you arrive for surgery, report to [facility].     ONCE THEY HAVE COMPLETED THEIR PROCESS, GO TO THE SECOND FLOOR AND     SIGN IN AT THE SURGERY DESK.     REMEMBER: Instructions that are not followed completely may result in serious medical risk,  up to and including death, or upon the discretion of your surgeon and anesthesiologist,            your surgery may need to be rescheduled.  __X__ 1. Do not eat food after midnight the night before your procedure.                    No gum, candy, lozenger, tic tacs, tums or hard candies.                  ABSOLUTELY NOTHING SOLID IN YOUR MOUTH AFTER MIDNIGHT                    You may drink unlimited clear liquids up to 2 hours before you are scheduled to arrive for surgery.                   Do not drink anything within those 2 hours unless you need to take medicine, then take the                   smallest amount you need.  Clear liquids include:  water, apple juice without pulp,                   any flavor Gatorade, Black coffee, black tea.  Sugar may be added but no dairy/ honey /lemon.                        Broth and jello is not considered a clear liquid.  __x__  2. On the morning of surgery, please brush your teeth with toothpaste and water. You may rinse with                  mouthwash if you wish but DO NOT SWALLOW TOOTHPASTE OR MOUTHWASH  __X___3. NO alcohol for 24 hours before or after surgery.  __x___ 4.  Do NOT smoke or use e-cigarettes for 24 HOURS PRIOR TO SURGERY.                      DO NOT Use any chewable tobacco products for at least 6 hours prior to surgery.  __x___ 5. If you start any new medication after this  appointment and prior to surgery, please                   Bring it with you on the day of surgery.  ___x__ 6. Notify your doctor if there is any change in your medical condition, such as fever,                  infection, vomitting, diarrhea or any open sores.  __x___ 7.  USE the CHG SOAP as instructed, the night before surgery and the day of surgery.                   Once you have washed with this soap, do NOT use any of the following: Powders, perfumes                    or lotions. Please do not wear make up, hairpins, clips or nail polish. You MAY wear deodorant.                   Men may shave their face and neck. PLEASE HAVE CLEAN PAJAMAS, CLEAN SHEETS AND                       NO ANIMALS IN BED WITH YOU!!  :(                   DO NOT wear ANY jewelry on the day of surgery. If there are rings that are too tight to                    remove easily, please address this prior to the surgery day. Piercings need to be removed.                                                                     NO METAL ON YOUR BODY.                    Do NOT bring any valuables.  If you came to Pre-Admit testing then you will not need license,                     insurance card or credit card.  If you will be staying overnight, please either leave your things in                     the car or have your family be responsible for these items.                     [facility] IS NOT RESPONSIBLE FOR BELONGINGS OR VALUABLES.  ___X__ 8. DO NOT wear contact lenses on surgery day.  You may not have dentures,                     Hearing aides, contacts or glasses in the operating room. These items can be                    Placed in the Recovery Room to receive immediately after surgery.  __x___ 9. IF YOU ARE SCHEDULED TO GO HOME ON THE SAME DAY, YOU MUST                   Have someone to drive you home and to stay with you  for the first 24  hours.                    Have an arrangement prior to arriving on surgery  day.  ___x__ 10. Take the following medications on the morning of surgery with a sip of water:                              1. IMDUR                     2. PROTONIX (take an extra dose the night before surgery)                     3. PAXIL                     4. COMBIVENT INHALER                     5.                     6. TAKE YOUR EVENING MEDICATIONS AS USUAL.  __X__  12. STOP PLAVIX 7 DAYS PRIOR TO SURGERY.  STOP ALL ASPIRIN PRODUCTS                      BY 5 DAYS PRIOR TO SURGERY.                       THIS INCLUDES BC POWDERS / GOODIES POWDER  __x___ 13. STOP Anti-inflammatories as of TODAY, 05/17/20                      This includes IBUPROFEN / MOTRIN / ADVIL / ALEVE/ NAPROXYN                    YOU MAY TAKE TYLENOL ANY TIME PRIOR TO SURGERY.  ___X__ 14.  Stop supplements until after surgery.                     This includes:  FERROUS SULFATE  ___X__17.  Continue to take the following medications but do not take on the morning of surgery:                        ALTACE/RAMIPRIL  __X____18.  Wear clean and comfortable clothing to the hospital.  [place]. MAKE SURE TO HAVE STOOL SOFTENERS AT HOME. BRING PHONE NUMBERS FOR YOUR CONTACTS.

## 2020-05-18 ENCOUNTER — Encounter: Payer: Self-pay | Admitting: General Surgery

## 2020-05-18 NOTE — Progress Notes (Signed)
Perioperative Services  Pre-Admission/Anesthesia Testing Clinical Review  Date: 05/18/20  Patient Demographics:  Name: Louis Gutierrez DOB:   03-16-1950 MRN:   [id number]  Planned Surgical Procedure(s):    Case: [id number] Date/Time: 05/23/20 1247   Procedure: XI ROBOTIC ASSISTED INGUINAL HERNIA REPAIR WITH MESH (Bilateral Groin)   Anesthesia type: General   Pre-op diagnosis: K40.20 non recurrent bil inguinal hernia w/o obstruction or gangrene   Location: [facility] OR ROOM 06 / [facility]S FOR [facility]   Surgeons: Herbert Pun, MD    NOTE: Available PAT nursing documentation and vital signs have been reviewed. Clinical nursing staff has updated patient's PMH/PSHx, current medication list, and drug allergies/intolerances to ensure comprehensive history available to assist in medical decision making as it pertains to the aforementioned surgical procedure and anticipated anesthetic course.   Clinical Discussion:  Louis Gutierrez is a 70 y.o. male who is submitted for pre-surgical anesthesia review and clearance prior to him undergoing the above procedure. Patient is a Former Smoker (53 pack years; quit 10/2019). Pertinent PMH includes: CAD, STEMI, ischemic cardiomyopathy, aortic atherosclerosis, CVA, HTN, HLD, T2DM, adrenal adenoma, OSAH (does not use nocturnal PAP therapy), COPD, GERD, IDA, OA, cervical DDD (s/p ACDF of C3-C4), insomnia, anxiety, depression  Patient is followed by cardiology Sallyanne Kuster, MD). He was last seen in the cardiology clinic on 12/22/2019; notes reviewed.  At the time of his clinic visit, patient doing well overall from a cardiovascular perspective.  He denied any chest pain, PND, orthopnea, palpitations, peripheral edema, vertiginous symptoms, or presyncope/syncope.  Patient with complaints of chronic DOE related to his underlying COPD diagnosis.  Patient with a past medical history significant for cardiovascular disease.   Patient suffered an inferior wall  STEMI on 06/09/2010.  Patient transported via EMS from home to [facility] in [place] for treatment.  PCI was performed revealing a CTO of the RCA and LCx, 50% stenosis of the ostial D1, and 40% stenosis ostial D2.  DES x 1 was placed to the RCA.   Myocardial perfusion imaging study performed on 07/01/2012 suggests no evidence of stress-induced myocardial ischemia or arrhythmia.  There was an old scar in the RCA territory without significant area of reversible ischemia consistent with previous MI.  LVEF mildly reduced at 51% due to inferior wall hypokinesis.   TTE performed on 04/05/2013 demonstrated a low normal left ventricular systolic function with an EF of 50-55%.  There was no evidence of significant valvular insufficiency.   Patient presented to the ED at [facility] on 04/04/2013 with complaints of unstable angina.  Diagnostic left heart catheterization was performed revealing a right dominant coronary circulation.  There was 40% stenosis of the mid LAD and 20% stenosis of the mid RCA at the site of the previous stent.  LVEF was 55%.   Patient presented to the ED in 05/2013 with right hemiparesis and expressive aphasia.  MRI imaging of the brain was (+) for an acute left MCA infarction, with additional small areas of left parietal cortical and subcortical infarction.  TTE was repeated during his admission that showed normal left ventricular systolic function with an EF of 55-60%.  Carotid Doppler study showed a minimal amount of bilateral atherosclerotic plaque (R >L), but no hemodynamically significant stenosis.   Patient underwent ILR placement and 08/2013.  Device was interrogated regularly, however no arrhythmias were ever recorded.  Per cardiology, ILR has reached end of service and is no longer functioning.   Most recent TTE performed on 10/08/2016 revealed normal left  ventricular systolic function (EF 69-48%) with inferior myocardium hypokinesis (see full interpretation of cardiovascular  test below).  Patient remains on chronic DAPT therapy (ASA + clopidogrel).  He is compliant with therapy with no evidence of GI bleeding.  Patient on GDMT for his HTN and HLD diagnoses.  Blood pressure reasonably controlled at 136/78 on currently prescribed nitrate and ACEi therapies.  Patient is on a statin for his HLD. T2DM well controlled on currently prescribed regimen; Hgb A1c 6.5% when last checked on 04/26/2020. Functional capacity, as defined by DASI, is documented as being >/= 4 METS.  No changes were made to patient's medication regimen.  Patient to follow-up with outpatient cardiology in 12 months or sooner if needed.  Patient is scheduled for an elective hernia repair on 05/23/2020 with Dr. Herbert Pun.  Given patient's past medical history significant for cardiovascular diagnoses, presurgical cardiac clearance was sought by the performing surgeon's office and PAT team. Per cardiology, "based on patient's past medical history and time since his last clinic visit, patient would be at an overall ACCEPTABLE risk for the planned procedure without further cardiovascular testing or intervention at this time".  Again, this patient is on daily DAPT therapy. He has been instructed on recommendations for holding his ASA and clopidogrel for 7 days prior to his procedure with plans to restart as soon as postoperative bleeding risk felt to be minimized by his attending surgeon. The patient has been instructed that his last dose of his antiplatelet medications will be on 05/15/2020.  Patient denies previous perioperative complications with anesthesia in the past. In review of the available records, it is noted that patient underwent a general anesthetic course here (ASA III) in 02/2020 without documented complications.   Vitals with BMI 05/17/2020 03/16/2020 03/16/2020  Height _0  - -  Weight 207 lbs - -  BMI 54.62 - -  Systolic - 703 500  Diastolic - 90 87  Pulse - 80 85     Providers/Specialists:   NOTE: Primary physician provider listed below. Patient may have been seen by APP or partner within same practice.   PROVIDER ROLE / SPECIALTY LAST Tanna Savoy, MD  General Surgery  05/10/2020  Kirk Ruths, MD  Primary Care Provider  05/03/2020  Morrell Riddle, MD  Cardiology  12/22/2019   Allergies:  Penicillins  Current Home Medications:   No current facility-administered medications for this encounter.   Marland Kitchen acetaminophen (TYLENOL) 500 MG tablet  . aspirin 81 MG chewable tablet  . clopidogrel (PLAVIX) 75 MG tablet  . COMBIVENT RESPIMAT 20-100 MCG/ACT AERS respimat  . ferrous sulfate 325 (65 FE) MG EC tablet  . isosorbide mononitrate (IMDUR) 30 MG 24 hr tablet  . nitroGLYCERIN (NITROSTAT) 0.4 MG SL tablet  . pantoprazole (PROTONIX) 40 MG tablet  . PARoxetine (PAXIL) 40 MG tablet  . pravastatin (PRAVACHOL) 40 MG tablet  . ramipril (ALTACE) 10 MG capsule  . traZODone (DESYREL) 100 MG tablet  . predniSONE (STERAPRED UNI-PAK 21 TAB) 10 MG (21) TBPK tablet  . traMADol (ULTRAM) 50 MG tablet   History:   Past Medical History:  Diagnosis Date  . Adrenal adenoma, left   . Anxiety   . Aortic atherosclerosis ([place])   . Asthma without status asthmaticus   . Atherosclerotic cerebrovascular disease    Overview:  Small and facial weakness with minimal weakness   . CAD (coronary artery disease)    a. 05/2010 Inf STEMI/PCI: RCA 179m(3.5x24 Promus DES), LCX small, 100, D1  50ost, D2 40ost, EF 45-50%; b. 03/2013 Cath ([facility]): 40% mLAD, 20% stenosis mRCA (site of the previous stent); LVEF 55%.  . Cervical disc disease    a. s/p C3-4 fusion 2003.  Marland Kitchen Cervical myelopathy ([place])    a. 09/2016 Admit w/ left sided wkns->found to have progressive cervical disc dzs above and below prior fusioin site; b. 10/2016 s/p ant cervical diskectomy.  Marland Kitchen COPD (chronic obstructive pulmonary disease) ([place])   . Cryptogenic stroke ([place]) 05/2013    s/p acute  L MCA territory infarct; b. 07/2013 s/p MDT Linq ILR-->No AFib to documented to date (09/2016).  . Current use of long term anticoagulation    DAPT therapy (ASA + clopidogrel)  . Depression   . Diabetes mellitus without complication ([place])   . Essential (primary) hypertension   . Gastritis   . GERD (gastroesophageal reflux disease)   . H/O insomnia   . History of cerebrovascular accident (CVA) with residual deficit 2015   facial drooping on one side of face is only residual since stroke  . IDA (iron deficiency anemia)   . Inguinal hernia   . Insomnia disorder   . Ischemic cardiomyopathy    a. 05/2010 LV gram: EF 45-50% @ time of inf MI; b. 09/2016 Echo: EF 50-55%, inf HK, Gr1 DD, mild MR.  . Mixed hyperlipidemia   . Nephrolithiasis   . Obesity   . OSA (obstructive sleep apnea)    no cpap  . Osteoarthritis   . STEMI (ST elevation myocardial infarction) ([place]) 06/07/2010   1 stent  . Tobacco abuse   . Urinary incontinence   . Varicose veins   . Vertigo    Past Surgical History:  Procedure Laterality Date  . ANTERIOR CERVICAL DECOMP/DISCECTOMY FUSION N/A 10/22/2016   Procedure: ANTERIOR CERVICAL DECOMPRESSION/DISCECTOMY FUSION 2 LEVELS C3-4, C6-7;  Surgeon: Meade Maw, MD;  Location: [facility] ORS;  Service: Neurosurgery;  Laterality: N/A;  . CARDIAC CATHETERIZATION  06/07/2010   50 % ostial narrowing in 1st diagonal from LAD, 2nd diagonal had 40% ostial narrowing; AV groove circumflex was diminutive and occluded proximally; RCA totally occluded in mid segment - stented with 3.5 x 63m Promus Element DES  - See Media tab  . CARDIAC CATHETERIZATION  04/04/2013   40% mLAD, 20% mRCA at the site of the previous stent.  LVEF was 55%.  . COLONOSCOPY WITH PROPOFOL N/A 02/26/2015   Procedure: COLONOSCOPY WITH PROPOFOL;  Surgeon: MJosefine Class MD;  [facility];  Service: Endoscopy;  Laterality: N/A;  . COLONOSCOPY WITH PROPOFOL N/A 03/16/2020   Procedure: COLONOSCOPY WITH  PROPOFOL;  Surgeon: LLesly Rubenstein MD;  Location: [facility] ENDOSCOPY;  Service: Endoscopy;  Laterality: N/A;  . ESOPHAGOGASTRODUODENOSCOPY  02/26/2015   Procedure: ESOPHAGOGASTRODUODENOSCOPY (EGD);  Surgeon: MJosefine Class MD;  [facility];  Service: Endoscopy;;  . ESOPHAGOGASTRODUODENOSCOPY (EGD) WITH PROPOFOL N/A 03/16/2020   Procedure: ESOPHAGOGASTRODUODENOSCOPY (EGD) WITH PROPOFOL;  Surgeon: LLesly Rubenstein MD;  Location: [facility] ENDOSCOPY;  Service: Endoscopy;  Laterality: N/A;  . EYE SURGERY     cataract replaced  . LOOP RECORDER IMPLANT  08/16/13   Dr. CSallyanne Kuster  Family History  Problem Relation Age of Onset  . Cancer Mother        oral  . Stroke Maternal Grandmother   . Diabetes Brother   . Coronary artery disease Brother    Social History   Tobacco Use  . Smoking status: Former Smoker    Packs/day: 1.00    Years: 53.00  Pack years: 53.00    Types: Cigarettes    Quit date: 11/14/2019    Years since quitting: 0.5  . Smokeless tobacco: Never Used  Vaping Use  . Vaping Use: Some days  . Start date: 11/14/2019  . Substances: Nicotine  Substance Use Topics  . Alcohol use: No  . Drug use: No    Pertinent Clinical Results:  LABS: Labs reviewed: Acceptable for surgery. Order: [id number]  Ref Range & Units 3 wk ago  WBC (White Blood Cell Count) 4.1 - 10.2 10^3/uL 6.0   RBC (Red Blood Cell Count) 4.69 - 6.13 10^6/uL 5.27   Hemoglobin 14.1 - 18.1 gm/dL 14.0Low   Hematocrit 40.0 - 52.0 % 46.0   MCV (Mean Corpuscular Volume) 80.0 - 100.0 fl 87.3   MCH (Mean Corpuscular Hemoglobin) 27.0 - 31.2 pg 26.6Low   MCHC (Mean Corpuscular Hemoglobin Concentration) 32.0 - 36.0 gm/dL 30.4Low   Platelet Count 150 - 450 10^3/uL 283   RDW-CV (Red Cell Distribution Width) 11.6 - 14.8 % 15.5High   MPV (Mean Platelet Volume) 9.4 - 12.4 fl 9.4   Neutrophils 1.50 - 7.80 10^3/uL 3.98   Lymphocytes 1.00 - 3.60 10^3/uL 1.30   Monocytes 0.00 - 1.50 10^3/uL 0.55    Eosinophils 0.00 - 0.55 10^3/uL 0.13   Basophils 0.00 - 0.09 10^3/uL 0.05   Neutrophil % 32.0 - 70.0 % 66.1   Lymphocyte % 10.0 - 50.0 % 21.6   Monocyte % 4.0 - 13.0 % 9.1   Eosinophil % 1.0 - 5.0 % 2.2   Basophil% 0.0 - 2.0 % 0.8   Immature Granulocyte % <=0.7 % 0.2   Immature Granulocyte Count <=0.06 10^3/L 0.01   Resulting Agency  [facility] - [facility]  Specimen Collected: 04/26/20 08:14 Last Resulted: 04/26/20 09:13  Received From: [facility]  Result Received: 04/26/20 13:15   Order: [id number]  Ref Range & Units 3 wk ago  Glucose 70 - 110 mg/dL 138High   Sodium 136 - 145 mmol/L 140   Potassium 3.6 - 5.1 mmol/L 4.8   Chloride 97 - 109 mmol/L 103   Carbon Dioxide (CO2) 22.0 - 32.0 mmol/L 31.6   Calcium 8.7 - 10.3 mg/dL 9.5   Urea Nitrogen (BUN) 7 - 25 mg/dL 17   Creatinine 0.7 - 1.3 mg/dL 1.1   Glomerular Filtration Rate (eGFR), MDRD Estimate >60 mL/min/1.73sq m 66   BUN/Crea Ratio 6.0 - 20.0 15.5   Anion Gap w/K 6.0 - 16.0 10.2   Resulting Agency  [facility] - [facility]  Specimen Collected: 04/26/20 08:14 Last Resulted: 04/26/20 09:43  Received From: [facility]  Result Received: 04/26/20 13:15  Order: [id number]  Ref Range & Units 3 wk ago  Hemoglobin A1C 4.2 - 5.6 % 6.5High   Average Blood Glucose (Calc) mg/dL 140   Resulting Agency  [facility] - LAB    ECG: Date: 12/22/2019 Time ECG obtained: 0836 AM Rate: 88 bpm Rhythm: Sinus rhythm with occasional PVCs; RBBB Axis (leads I and aVF): Normal Intervals: PR 182 ms. QRS 122 ms. QTc 462 ms. ST segment and T wave changes: No evidence of acute ST segment elevation or depression; evidence of an age-indeterminate inferior infarct Comparison: Similar to previous tracing obtained on 05/18/2019   IMAGING / PROCEDURES: MRI CERVICAL SPINE WITHOUT CONTRAST performed on 07/11/2019 1. At C2-3 there is a broad-based disc bulge.  2. Mild bilateral facet arthropathy.   3. Moderate right foraminal stenosis.  4. Mild left  foraminal stenosis. 5. Anterior cervical fusion from C3 through C7. Anterior fusion hardware is present at C3-4 and C6-7. Solid osseous fusion across the disc spaces at C4-C6. Moderate-severe bilateral foraminal narrowing at C3-4. Severe bilateral foraminal stenosis at C6-7. 6. No acute osseous injury of the cervical spine.  CT CHEST WITHOUT CONTRAST performed on 06/08/2019 1. Subacute nondisplaced right lateral sixth, seventh, and eighth rib fractures. 2. No additional acute traumatic injury to the chest.  3. No pneumothorax or pulmonary complication. 4. Stable pulmonary nodules, largest measuring 6 mm in the right middle lobe, unchanged from lung cancer screening CT 08/25/2018. 5. Stable scarring throughout the left lung. 6. Stable left adrenal adenoma.  Aortic Atherosclerosis ECHOCARDIOGRAM performed on 10/08/2016 1. LVEF 50-55% 2. Left ventricular systolic function normal 3. Left ventricular cavity size normal 4. Hypokinesis of the inferior myocardium 5. Doppler parameters are consistent with abnormal left ventricular relaxation (G1DD) 6. Mild mitral valve regurgitation 7. Left atrium normal in size 8. Right ventricular systolic function was normal 9. PASP normal  BILATERAL CAROTID DOPPLER performed on 10/08/2016 1. Color duplex indicates minimal heterogeneous and calcified plaque 2. No hemodynamically significant stenosis by duplex criteria in the extracranial cerebrovascular circulation 3. Vertebrals demonstrate antegrade flow with low resistance waveform  LEFT HEART CATHETERIZATION AND CORONARY ANGIOGRAPHY performed on 04/04/2013 1. LVEF 55% 2. Right dominant coronary circulation   3. 40% stenosis of the mid LAD 4. 20% stenosis of the mid RCA at the site of the previous stent  LEXISCAN performed on 07/01/2012 1. Mildly reduced left ventricular systolic function (EF 89%). 2. Inferior wall hypokinesis 3. Fixed defect  most consistent with previous myocardial infarction  LEFT HEART CATHETERIZATION AND CORONARY ANGIOGRAPHY performed on 06/09/2010 1. CTO of the RCA and LCx  DES x1 was placed to the RCA. 2. 50% stenosis of the ostial D1 3. 40% stenosis ostial D2  Impression and Plan:  Louis Gutierrez has been referred for pre-anesthesia review and clearance prior to him undergoing the planned anesthetic and procedural courses. Available labs, pertinent testing, and imaging results were personally reviewed by me. This patient has been appropriately cleared by cardiology with an overall ACCEPTABLE risk of significant perioperative cardiovascular complications.  Based on clinical review performed today (05/18/20), barring any significant acute changes in the patient's overall condition, it is anticipated that he will be able to proceed with the planned surgical intervention. Any acute changes in clinical condition may necessitate his procedure being postponed and/or cancelled. Patient will meet with anesthesia team (MD and/or CRNA) on the day of his procedure for preoperative evaluation/assessment. Questions regarding anesthetic course will be fielded at that time.   Pre-surgical instructions were reviewed with the patient during his PAT appointment and questions were fielded by PAT clinical staff. Patient was advised that if any questions or concerns arise prior to his procedure then he should return a call to PAT and/or his surgeon's office to discuss.  Honor Loh, MSN, APRN, FNP-C, CEN [facility]  Peri-operative Services Nurse Practitioner Phone: [phone] 05/18/20 10:11 AM  NOTE: This note has been prepared using Dragon dictation software. Despite my best ability to proofread, there is always the potential that unintentional transcriptional errors may still occur from this process.

## 2020-05-21 ENCOUNTER — Other Ambulatory Visit
Admission: RE | Admit: 2020-05-21 | Discharge: 2020-05-21 | Disposition: A | Discharge status: Home / Self Care | Payer: Medicare HMO | Source: Ambulatory Visit | Attending: General Surgery | Admitting: General Surgery

## 2020-05-21 ENCOUNTER — Other Ambulatory Visit: Payer: Self-pay

## 2020-05-21 DIAGNOSIS — Z01812 Encounter for preprocedural laboratory examination: Secondary | ICD-10-CM | POA: Insufficient documentation

## 2020-05-21 DIAGNOSIS — Z20822 Contact with and (suspected) exposure to covid-19: Secondary | ICD-10-CM | POA: Diagnosis not present

## 2020-05-22 LAB — SARS CORONAVIRUS 2 (TAT 6-24 HRS): SARS Coronavirus 2: NEGATIVE

## 2020-05-23 ENCOUNTER — Other Ambulatory Visit: Payer: Self-pay

## 2020-05-23 ENCOUNTER — Ambulatory Visit: Payer: Medicare HMO | Admitting: Urgent Care

## 2020-05-23 ENCOUNTER — Encounter
Admission: RE | Disposition: A | Discharge status: Home / Self Care | Payer: Self-pay | Source: Home / Self Care | Attending: Internal Medicine

## 2020-05-23 ENCOUNTER — Encounter: Payer: Self-pay | Admitting: General Surgery

## 2020-05-23 ENCOUNTER — Observation Stay
Admission: RE | Admit: 2020-05-23 | Discharge: 2020-05-24 | Disposition: A | Discharge status: Home / Self Care | Payer: Medicare HMO | Attending: Internal Medicine | Admitting: Internal Medicine

## 2020-05-23 ENCOUNTER — Ambulatory Visit: Payer: Medicare HMO

## 2020-05-23 DIAGNOSIS — R0602 Shortness of breath: Secondary | ICD-10-CM | POA: Diagnosis not present

## 2020-05-23 DIAGNOSIS — Z79899 Other long term (current) drug therapy: Secondary | ICD-10-CM | POA: Insufficient documentation

## 2020-05-23 DIAGNOSIS — J9811 Atelectasis: Secondary | ICD-10-CM | POA: Diagnosis not present

## 2020-05-23 DIAGNOSIS — E669 Obesity, unspecified: Secondary | ICD-10-CM | POA: Diagnosis present

## 2020-05-23 DIAGNOSIS — Z20822 Contact with and (suspected) exposure to covid-19: Secondary | ICD-10-CM | POA: Diagnosis not present

## 2020-05-23 DIAGNOSIS — I2510 Atherosclerotic heart disease of native coronary artery without angina pectoris: Secondary | ICD-10-CM | POA: Diagnosis not present

## 2020-05-23 DIAGNOSIS — E119 Type 2 diabetes mellitus without complications: Secondary | ICD-10-CM | POA: Insufficient documentation

## 2020-05-23 DIAGNOSIS — Z7902 Long term (current) use of antithrombotics/antiplatelets: Secondary | ICD-10-CM | POA: Diagnosis not present

## 2020-05-23 DIAGNOSIS — J439 Emphysema, unspecified: Secondary | ICD-10-CM | POA: Diagnosis not present

## 2020-05-23 DIAGNOSIS — I10 Essential (primary) hypertension: Secondary | ICD-10-CM | POA: Diagnosis not present

## 2020-05-23 DIAGNOSIS — J449 Chronic obstructive pulmonary disease, unspecified: Secondary | ICD-10-CM | POA: Diagnosis present

## 2020-05-23 DIAGNOSIS — K4020 Bilateral inguinal hernia, without obstruction or gangrene, not specified as recurrent: Secondary | ICD-10-CM | POA: Diagnosis not present

## 2020-05-23 DIAGNOSIS — J45909 Unspecified asthma, uncomplicated: Secondary | ICD-10-CM | POA: Diagnosis not present

## 2020-05-23 DIAGNOSIS — Z9189 Other specified personal risk factors, not elsewhere classified: Secondary | ICD-10-CM

## 2020-05-23 DIAGNOSIS — E782 Mixed hyperlipidemia: Secondary | ICD-10-CM | POA: Diagnosis present

## 2020-05-23 DIAGNOSIS — Z87891 Personal history of nicotine dependence: Secondary | ICD-10-CM | POA: Diagnosis not present

## 2020-05-23 DIAGNOSIS — K4090 Unilateral inguinal hernia, without obstruction or gangrene, not specified as recurrent: Principal | ICD-10-CM | POA: Insufficient documentation

## 2020-05-23 DIAGNOSIS — F325 Major depressive disorder, single episode, in full remission: Secondary | ICD-10-CM | POA: Diagnosis present

## 2020-05-23 DIAGNOSIS — R0600 Dyspnea, unspecified: Secondary | ICD-10-CM | POA: Diagnosis not present

## 2020-05-23 DIAGNOSIS — K219 Gastro-esophageal reflux disease without esophagitis: Secondary | ICD-10-CM | POA: Diagnosis not present

## 2020-05-23 DIAGNOSIS — G4733 Obstructive sleep apnea (adult) (pediatric): Secondary | ICD-10-CM | POA: Diagnosis present

## 2020-05-23 DIAGNOSIS — Z7982 Long term (current) use of aspirin: Secondary | ICD-10-CM | POA: Diagnosis not present

## 2020-05-23 HISTORY — PX: XI ROBOTIC ASSISTED INGUINAL HERNIA REPAIR WITH MESH: SHX6706

## 2020-05-23 HISTORY — DX: Benign neoplasm of left adrenal gland: D35.02

## 2020-05-23 HISTORY — DX: Iron deficiency anemia, unspecified: D50.9

## 2020-05-23 HISTORY — DX: Atherosclerosis of aorta: I70.0

## 2020-05-23 HISTORY — DX: Long term (current) use of anticoagulants: Z79.01

## 2020-05-23 LAB — CBC WITH DIFFERENTIAL/PLATELET
Abs Immature Granulocytes: 0.03 10*3/uL (ref 0.00–0.07)
Basophils Absolute: 0 10*3/uL (ref 0.0–0.1)
Basophils Relative: 0 %
Eosinophils Absolute: 0 10*3/uL (ref 0.0–0.5)
Eosinophils Relative: 0 %
HCT: 40.2 % (ref 39.0–52.0)
Hemoglobin: 12.6 g/dL — ABNORMAL LOW (ref 13.0–17.0)
Immature Granulocytes: 0 %
Lymphocytes Relative: 3 %
Lymphs Abs: 0.2 10*3/uL — ABNORMAL LOW (ref 0.7–4.0)
MCH: 26.9 pg (ref 26.0–34.0)
MCHC: 31.3 g/dL (ref 30.0–36.0)
MCV: 85.9 fL (ref 80.0–100.0)
Monocytes Absolute: 0.1 10*3/uL (ref 0.1–1.0)
Monocytes Relative: 1 %
Neutro Abs: 7.3 10*3/uL (ref 1.7–7.7)
Neutrophils Relative %: 96 %
Platelets: 230 10*3/uL (ref 150–400)
RBC: 4.68 MIL/uL (ref 4.22–5.81)
RDW: 15.3 % (ref 11.5–15.5)
WBC: 7.6 10*3/uL (ref 4.0–10.5)
nRBC: 0 % (ref 0.0–0.2)

## 2020-05-23 LAB — COMPREHENSIVE METABOLIC PANEL
ALT: 22 U/L (ref 0–44)
AST: 32 U/L (ref 15–41)
Albumin: 3.6 g/dL (ref 3.5–5.0)
Alkaline Phosphatase: 120 U/L (ref 38–126)
Anion gap: 10 (ref 5–15)
BUN: 22 mg/dL (ref 8–23)
CO2: 24 mmol/L (ref 22–32)
Calcium: 8.9 mg/dL (ref 8.9–10.3)
Chloride: 102 mmol/L (ref 98–111)
Creatinine, Ser: 1.17 mg/dL (ref 0.61–1.24)
GFR, Estimated: >60 mL/min (ref >60)
Glucose, Bld: 232 mg/dL — ABNORMAL HIGH (ref 70–99)
Potassium: 4.4 mmol/L (ref 3.5–5.1)
Sodium: 136 mmol/L (ref 135–145)
Total Bilirubin: 0.7 mg/dL (ref 0.3–1.2)
Total Protein: 7 g/dL (ref 6.5–8.1)

## 2020-05-23 LAB — RESP PANEL BY RT-PCR (FLU A&B, COVID) ARPGX2
Influenza A by PCR: NEGATIVE
Influenza B by PCR: NEGATIVE
SARS Coronavirus 2 by RT PCR: NEGATIVE

## 2020-05-23 LAB — TSH: TSH: 1.274 u[IU]/mL (ref 0.350–4.500)

## 2020-05-23 LAB — PROCALCITONIN: Procalcitonin: <0.1 ng/mL

## 2020-05-23 LAB — BRAIN NATRIURETIC PEPTIDE: B Natriuretic Peptide: 92.2 pg/mL (ref 0.0–100.0)

## 2020-05-23 LAB — TROPONIN I (HIGH SENSITIVITY)
Troponin I (High Sensitivity): 12 ng/L (ref <18)
Troponin I (High Sensitivity): 12 ng/L (ref <18)

## 2020-05-23 LAB — GLUCOSE, CAPILLARY
Glucose-Capillary: 132 mg/dL — ABNORMAL HIGH (ref 70–99)
Glucose-Capillary: 192 mg/dL — ABNORMAL HIGH (ref 70–99)

## 2020-05-23 SURGERY — REPAIR, HERNIA, INGUINAL, ROBOT-ASSISTED, LAPAROSCOPIC, USING MESH
Anesthesia: General | Site: Inguinal | Laterality: Left

## 2020-05-23 MED ORDER — SODIUM CHLORIDE 0.9% FLUSH
3.0000 mL | Freq: Two times a day (BID) | INTRAVENOUS | Status: DC
  Administered 2020-05-23 – 2020-05-24 (×2): 3 mL via INTRAVENOUS

## 2020-05-23 MED ORDER — FENTANYL CITRATE (PF) 100 MCG/2ML IJ SOLN: 25.0000 ug | INTRAMUSCULAR | Status: DC | PRN

## 2020-05-23 MED ORDER — TRAMADOL HCL 50 MG PO TABS: 50.0000 mg | ORAL_TABLET | Freq: Four times a day (QID) | ORAL | Status: DC | PRN

## 2020-05-23 MED ORDER — SUCCINYLCHOLINE CHLORIDE 20 MG/ML IJ SOLN
INTRAMUSCULAR | Status: DC | PRN
  Administered 2020-05-23: 100 mg via INTRAVENOUS

## 2020-05-23 MED ORDER — FENTANYL CITRATE (PF) 100 MCG/2ML IJ SOLN
INTRAMUSCULAR | Status: AC
  Filled 2020-05-23: qty 2

## 2020-05-23 MED ORDER — FERROUS SULFATE 325 (65 FE) MG PO TABS
325.0000 mg | ORAL_TABLET | Freq: Every day | ORAL | Status: DC
  Administered 2020-05-24: 325 mg via ORAL
  Filled 2020-05-23: qty 1

## 2020-05-23 MED ORDER — ISOSORBIDE MONONITRATE ER 30 MG PO TB24
30.0000 mg | ORAL_TABLET | Freq: Every day | ORAL | Status: DC
  Administered 2020-05-24: 30 mg via ORAL
  Filled 2020-05-23: qty 1

## 2020-05-23 MED ORDER — HYDROCODONE-ACETAMINOPHEN 5-325 MG PO TABS: 1.0000 | ORAL_TABLET | ORAL | 0 refills | Status: AC | PRN

## 2020-05-23 MED ORDER — PANTOPRAZOLE SODIUM 40 MG PO TBEC
40.0000 mg | DELAYED_RELEASE_TABLET | Freq: Every day | ORAL | Status: DC
  Administered 2020-05-24: 40 mg via ORAL
  Filled 2020-05-23: qty 1

## 2020-05-23 MED ORDER — PAROXETINE HCL 20 MG PO TABS
40.0000 mg | ORAL_TABLET | Freq: Every day | ORAL | Status: DC
  Administered 2020-05-24: 40 mg via ORAL
  Filled 2020-05-23: qty 2

## 2020-05-23 MED ORDER — LIDOCAINE HCL (PF) 2 % IJ SOLN
INTRAMUSCULAR | Status: AC
  Filled 2020-05-23: qty 5

## 2020-05-23 MED ORDER — ORAL CARE MOUTH RINSE: 15.0000 mL | Freq: Once | OROMUCOSAL | Status: AC

## 2020-05-23 MED ORDER — METHYLPREDNISOLONE SODIUM SUCC 125 MG IJ SOLR
INTRAMUSCULAR | Status: AC
  Administered 2020-05-23: 125 mg via INTRAVENOUS
  Filled 2020-05-23: qty 2

## 2020-05-23 MED ORDER — ESMOLOL HCL 100 MG/10ML IV SOLN
INTRAVENOUS | Status: AC
  Filled 2020-05-23: qty 10

## 2020-05-23 MED ORDER — NALOXONE HCL 0.4 MG/ML IJ SOLN
INTRAMUSCULAR | Status: DC | PRN
  Administered 2020-05-23: 20 ug via INTRAVENOUS

## 2020-05-23 MED ORDER — METOPROLOL TARTRATE 5 MG/5ML IV SOLN
INTRAVENOUS | Status: DC | PRN
  Administered 2020-05-23: 2 mg via INTRAVENOUS

## 2020-05-23 MED ORDER — PRAVASTATIN SODIUM 40 MG PO TABS
40.0000 mg | ORAL_TABLET | Freq: Every day | ORAL | Status: DC
  Administered 2020-05-23: 40 mg via ORAL
  Filled 2020-05-23 (×2): qty 1
  Filled 2020-05-23: qty 2

## 2020-05-23 MED ORDER — RAMIPRIL 10 MG PO CAPS
10.0000 mg | ORAL_CAPSULE | Freq: Every day | ORAL | Status: DC
  Administered 2020-05-23 – 2020-05-24 (×2): 10 mg via ORAL
  Filled 2020-05-23 (×2): qty 1

## 2020-05-23 MED ORDER — CHLORHEXIDINE GLUCONATE 0.12 % MT SOLN
OROMUCOSAL | Status: AC
  Administered 2020-05-23: 15 mL via OROMUCOSAL
  Filled 2020-05-23: qty 15

## 2020-05-23 MED ORDER — BUPIVACAINE-EPINEPHRINE (PF) 0.25% -1:200000 IJ SOLN
INTRAMUSCULAR | Status: AC
  Filled 2020-05-23: qty 30

## 2020-05-23 MED ORDER — SODIUM CHLORIDE 0.9% FLUSH: 3.0000 mL | INTRAVENOUS | Status: DC | PRN

## 2020-05-23 MED ORDER — NITROGLYCERIN 0.4 MG SL SUBL: 0.4000 mg | SUBLINGUAL_TABLET | SUBLINGUAL | Status: DC | PRN

## 2020-05-23 MED ORDER — ONDANSETRON HCL 4 MG/2ML IJ SOLN: 4.0000 mg | Freq: Once | INTRAMUSCULAR | Status: DC | PRN

## 2020-05-23 MED ORDER — IPRATROPIUM-ALBUTEROL 0.5-2.5 (3) MG/3ML IN SOLN: 3.0000 mL | Freq: Four times a day (QID) | RESPIRATORY_TRACT | Status: DC | PRN

## 2020-05-23 MED ORDER — IPRATROPIUM-ALBUTEROL 20-100 MCG/ACT IN AERS
1.0000 | INHALATION_SPRAY | Freq: Four times a day (QID) | RESPIRATORY_TRACT | Status: DC | PRN
  Filled 2020-05-23: qty 4

## 2020-05-23 MED ORDER — ESMOLOL HCL 100 MG/10ML IV SOLN
INTRAVENOUS | Status: DC | PRN
  Administered 2020-05-23: 10 mg via INTRAVENOUS
  Administered 2020-05-23: 15 mg via INTRAVENOUS

## 2020-05-23 MED ORDER — CLINDAMYCIN PHOSPHATE 900 MG/50ML IV SOLN
INTRAVENOUS | Status: AC
  Filled 2020-05-23: qty 50

## 2020-05-23 MED ORDER — PROPOFOL 10 MG/ML IV BOLUS
INTRAVENOUS | Status: AC
  Filled 2020-05-23: qty 20

## 2020-05-23 MED ORDER — SUGAMMADEX SODIUM 200 MG/2ML IV SOLN
INTRAVENOUS | Status: DC | PRN
  Administered 2020-05-23 (×2): 200 mg via INTRAVENOUS

## 2020-05-23 MED ORDER — SODIUM CHLORIDE 0.9 % IV SOLN: 250.0000 mL | INTRAVENOUS | Status: DC | PRN

## 2020-05-23 MED ORDER — FUROSEMIDE 10 MG/ML IJ SOLN
INTRAMUSCULAR | Status: AC
  Filled 2020-05-23: qty 2

## 2020-05-23 MED ORDER — PHENYLEPHRINE HCL (PRESSORS) 10 MG/ML IV SOLN
INTRAVENOUS | Status: DC | PRN
  Administered 2020-05-23 (×2): 200 ug via INTRAVENOUS
  Administered 2020-05-23: 100 ug via INTRAVENOUS
  Administered 2020-05-23: 200 ug via INTRAVENOUS

## 2020-05-23 MED ORDER — ENOXAPARIN SODIUM 40 MG/0.4ML IJ SOSY
40.0000 mg | PREFILLED_SYRINGE | INTRAMUSCULAR | Status: DC
  Administered 2020-05-23: 40 mg via SUBCUTANEOUS
  Filled 2020-05-23: qty 0.4

## 2020-05-23 MED ORDER — ASPIRIN 81 MG PO CHEW
81.0000 mg | CHEWABLE_TABLET | Freq: Every day | ORAL | Status: DC
  Administered 2020-05-24: 81 mg via ORAL
  Filled 2020-05-23: qty 1

## 2020-05-23 MED ORDER — DEXAMETHASONE SODIUM PHOSPHATE 10 MG/ML IJ SOLN
INTRAMUSCULAR | Status: DC | PRN
  Administered 2020-05-23: 5 mg via INTRAVENOUS

## 2020-05-23 MED ORDER — CLINDAMYCIN PHOSPHATE 900 MG/50ML IV SOLN
900.0000 mg | INTRAVENOUS | Status: AC
  Administered 2020-05-23: 900 mg via INTRAVENOUS

## 2020-05-23 MED ORDER — METOPROLOL TARTRATE 5 MG/5ML IV SOLN
INTRAVENOUS | Status: AC
  Filled 2020-05-23: qty 5

## 2020-05-23 MED ORDER — FENTANYL CITRATE (PF) 100 MCG/2ML IJ SOLN
INTRAMUSCULAR | Status: DC | PRN
  Administered 2020-05-23 (×3): 50 ug via INTRAVENOUS

## 2020-05-23 MED ORDER — CHLORHEXIDINE GLUCONATE 0.12 % MT SOLN: 15.0000 mL | Freq: Once | OROMUCOSAL | Status: AC

## 2020-05-23 MED ORDER — ROCURONIUM BROMIDE 10 MG/ML (PF) SYRINGE
PREFILLED_SYRINGE | INTRAVENOUS | Status: AC
  Filled 2020-05-23: qty 10

## 2020-05-23 MED ORDER — FUROSEMIDE 10 MG/ML IJ SOLN
10.0000 mg | Freq: Once | INTRAMUSCULAR | Status: AC
  Administered 2020-05-23: 10 mg via INTRAVENOUS

## 2020-05-23 MED ORDER — TRAZODONE HCL 50 MG PO TABS: 50.0000 mg | ORAL_TABLET | Freq: Every evening | ORAL | Status: DC | PRN

## 2020-05-23 MED ORDER — METHYLPREDNISOLONE SODIUM SUCC 125 MG IJ SOLR: 125.0000 mg | Freq: Once | INTRAMUSCULAR | Status: AC

## 2020-05-23 MED ORDER — ROCURONIUM BROMIDE 100 MG/10ML IV SOLN
INTRAVENOUS | Status: DC | PRN
  Administered 2020-05-23: 20 mg via INTRAVENOUS
  Administered 2020-05-23: 50 mg via INTRAVENOUS

## 2020-05-23 MED ORDER — IPRATROPIUM-ALBUTEROL 0.5-2.5 (3) MG/3ML IN SOLN: 3.0000 mL | RESPIRATORY_TRACT | Status: DC

## 2020-05-23 MED ORDER — ONDANSETRON HCL 4 MG/2ML IJ SOLN: 4.0000 mg | Freq: Four times a day (QID) | INTRAMUSCULAR | Status: DC | PRN

## 2020-05-23 MED ORDER — PROPOFOL 10 MG/ML IV BOLUS
INTRAVENOUS | Status: DC | PRN
  Administered 2020-05-23: 150 mg via INTRAVENOUS

## 2020-05-23 MED ORDER — IPRATROPIUM-ALBUTEROL 0.5-2.5 (3) MG/3ML IN SOLN
RESPIRATORY_TRACT | Status: AC
  Filled 2020-05-23: qty 3

## 2020-05-23 MED ORDER — BUPIVACAINE-EPINEPHRINE 0.25% -1:200000 IJ SOLN
INTRAMUSCULAR | Status: DC | PRN
  Administered 2020-05-23: 30 mL

## 2020-05-23 MED ORDER — SODIUM CHLORIDE 0.9 % IV SOLN
INTRAVENOUS | Status: DC | PRN
  Administered 2020-05-23: 50 ug/min via INTRAVENOUS

## 2020-05-23 MED ORDER — FENTANYL CITRATE (PF) 100 MCG/2ML IJ SOLN
25.0000 ug | INTRAMUSCULAR | Status: DC | PRN
Start: 2020-05-23 — End: 2020-05-23

## 2020-05-23 MED ORDER — ACETAMINOPHEN 325 MG PO TABS
ORAL_TABLET | ORAL | Status: AC
  Filled 2020-05-23: qty 2

## 2020-05-23 MED ORDER — ONDANSETRON HCL 4 MG/2ML IJ SOLN
INTRAMUSCULAR | Status: DC | PRN
  Administered 2020-05-23: 4 mg via INTRAVENOUS

## 2020-05-23 MED ORDER — CLOPIDOGREL BISULFATE 75 MG PO TABS
75.0000 mg | ORAL_TABLET | Freq: Every day | ORAL | Status: DC
  Administered 2020-05-24: 75 mg via ORAL
  Filled 2020-05-23: qty 1

## 2020-05-23 MED ORDER — IPRATROPIUM-ALBUTEROL 0.5-2.5 (3) MG/3ML IN SOLN
RESPIRATORY_TRACT | Status: AC
  Administered 2020-05-23: 3 mL via RESPIRATORY_TRACT
  Filled 2020-05-23: qty 3

## 2020-05-23 MED ORDER — SODIUM CHLORIDE 0.9 % IV SOLN: INTRAVENOUS | Status: DC

## 2020-05-23 MED ORDER — ACETAMINOPHEN 325 MG PO TABS
650.0000 mg | ORAL_TABLET | ORAL | Status: DC | PRN
  Administered 2020-05-23: 650 mg via ORAL
  Filled 2020-05-23: qty 2

## 2020-05-23 MED ORDER — IPRATROPIUM-ALBUTEROL 0.5-2.5 (3) MG/3ML IN SOLN
3.0000 mL | RESPIRATORY_TRACT | Status: DC
  Administered 2020-05-23: 3 mL via RESPIRATORY_TRACT

## 2020-05-23 SURGICAL SUPPLY — 51 items
BAG INFUSER PRESSURE 100CC (MISCELLANEOUS) IMPLANT
BLADE SURG SZ11 CARB STEEL (BLADE) ×2 IMPLANT
CANISTER SUCT 1200ML W/VALVE (MISCELLANEOUS) ×1 IMPLANT
CHLORAPREP W/TINT 26 (MISCELLANEOUS) ×2 IMPLANT
COVER TIP SHEARS 8 DVNC (MISCELLANEOUS) ×1 IMPLANT
COVER TIP SHEARS 8MM DA VINCI (MISCELLANEOUS) ×1
COVER WAND RF STERILE (DRAPES) ×3 IMPLANT
DEFOGGER SCOPE WARMER CLEARIFY (MISCELLANEOUS) ×2 IMPLANT
DERMABOND ADVANCED (GAUZE/BANDAGES/DRESSINGS) ×1
DERMABOND ADVANCED .7 DNX12 (GAUZE/BANDAGES/DRESSINGS) ×1 IMPLANT
DRAPE ARM DVNC X/XI (DISPOSABLE) ×3 IMPLANT
DRAPE COLUMN DVNC XI (DISPOSABLE) ×1 IMPLANT
DRAPE DA VINCI XI ARM (DISPOSABLE) ×3
DRAPE DA VINCI XI COLUMN (DISPOSABLE) ×1
ELECT REM PT RETURN 9FT ADLT (ELECTROSURGICAL) ×2
ELECTRODE REM PT RTRN 9FT ADLT (ELECTROSURGICAL) ×1 IMPLANT
GLOVE SURG ENC MOIS LTX SZ6.5 (GLOVE) ×4 IMPLANT
GLOVE SURG UNDER POLY LF SZ6.5 (GLOVE) ×4 IMPLANT
GOWN STRL REUS W/ TWL LRG LVL3 (GOWN DISPOSABLE) ×3 IMPLANT
GOWN STRL REUS W/TWL LRG LVL3 (GOWN DISPOSABLE) ×3
IRRIGATOR SUCT 8 DISP DVNC XI (IRRIGATION / IRRIGATOR) IMPLANT
IRRIGATOR SUCTION 8MM XI DISP (IRRIGATION / IRRIGATOR)
IV CATH ANGIO 12GX3 LT BLUE (NEEDLE) IMPLANT
IV NS 1000ML (IV SOLUTION)
IV NS 1000ML BAXH (IV SOLUTION) IMPLANT
KIT PINK PAD W/HEAD ARE REST (MISCELLANEOUS) ×2
KIT PINK PAD W/HEAD ARM REST (MISCELLANEOUS) ×1 IMPLANT
LABEL OR SOLS (LABEL) IMPLANT
MANIFOLD NEPTUNE II (INSTRUMENTS) ×1 IMPLANT
MESH 3DMAX 4X6 LT LRG (Mesh General) ×1 IMPLANT
MESH 3DMAX MID 4X6 LT LRG (Mesh General) IMPLANT
NDL INSUFFLATION 14GA 120MM (NEEDLE) ×1 IMPLANT
NEEDLE HYPO 22GX1.5 SAFETY (NEEDLE) ×2 IMPLANT
NEEDLE INSUFFLATION 14GA 120MM (NEEDLE) ×2 IMPLANT
OBTURATOR OPTICAL STANDARD 8MM (TROCAR) ×1
OBTURATOR OPTICAL STND 8 DVNC (TROCAR) ×1
OBTURATOR OPTICALSTD 8 DVNC (TROCAR) ×1 IMPLANT
PACK LAP CHOLECYSTECTOMY (MISCELLANEOUS) ×2 IMPLANT
SEAL CANN UNIV 5-8 DVNC XI (MISCELLANEOUS) ×3 IMPLANT
SEAL XI 5MM-8MM UNIVERSAL (MISCELLANEOUS) ×3
SET TUBE SMOKE EVAC HIGH FLOW (TUBING) ×2 IMPLANT
SOLUTION ELECTROLUBE (MISCELLANEOUS) ×2 IMPLANT
SPONGE LAP 4X18 RFD (DISPOSABLE) ×1 IMPLANT
SUT MNCRL 4-0 (SUTURE) ×1
SUT MNCRL 4-0 27XMFL (SUTURE) ×1
SUT VIC AB 2-0 SH 27 (SUTURE) ×1
SUT VIC AB 2-0 SH 27XBRD (SUTURE) ×1 IMPLANT
SUT VLOC 90 S/L VL9 GS22 (SUTURE) ×2 IMPLANT
SUTURE MNCRL 4-0 27XMF (SUTURE) ×1 IMPLANT
TAPE TRANSPORE STRL 2 31045 (GAUZE/BANDAGES/DRESSINGS) ×1 IMPLANT
TRAY FOLEY MTR SLVR 16FR STAT (SET/KITS/TRAYS/PACK) ×1 IMPLANT

## 2020-05-23 NOTE — Interval H&P Note (Signed)
History and Physical Interval Note:  05/23/2020 12:43 PM  Louis Gutierrez  has presented today for surgery, with the diagnosis of K40.20 non recurrent bil inguinal hernia w/o obstruction or gangrene.  The various methods of treatment have been discussed with the patient and family. After consideration of risks, benefits and other options for treatment, the patient has consented to  Procedure(s): XI ROBOTIC [place] (Bilateral) as a surgical intervention.  The patient's history has been reviewed, patient examined, no change in status, stable for surgery.  I have reviewed the patient's chart and labs.  Questions were answered to the patient's satisfaction.     Herbert Pun

## 2020-05-23 NOTE — Patient Outreach (Signed)
[facility]) Care Management  05/23/2020  GIBRIL MASTRO 02/26/1950 [id number]   Telephone Assessment     Unsuccessful outreach to patient. Noted patient for outpatient surgery today.    Plan: RN CM will make outreach attempt to patient within 3-4 weeks.  Enzo Montgomery, RN,BSN,CCM [place] [facility] Telephonic Care Management Coordinator Direct Phone: [phone] Toll Free: [phone] Fax: [phone]

## 2020-05-23 NOTE — H&P (Signed)
History and Physical   Louis Gutierrez [id number] DOB: 1950/03/21 DOA: 05/23/2020  PCP: Kirk Ruths, MD  Outpatient Specialists: Dr. Windell Moment, general surgery Patient coming from: outpatient surgery  I have personally briefly reviewed patient's old medical records in [place].  Chief Concern: shortness of breath   HPI: Louis Gutierrez is a 70 y.o. male with medical history significant for bilateral inguinal hernia, obesity, hyperlipidemia, depression, hypertension, at risk for obstructive sleep apnea, CAD, was admitted to outpatient surgery for bilateral inguinal hernia repair with robotics.  Per report, after patient was extubated he developed shortness of breath requiring nasal cannula.  At bedside, Louis Gutierrez was able to tell me his full name, his age, current calendar year, and his current location.  He is reported to be that the shortness of breath, lasted seconds. He denies radiation to his extremities. He endorse nonproductive cough. He denies fever, nausea, vomiting, dysuria, hematuria, chest pain, abdominal pain, dysuria, hematuria, diarrhea.  He states he is never felt this way before.  He states that at this time he feels improved and the shortness of breath has resolved.  Social history: lives at home with spouse.   Vaccination: not vaccinated for covid 19  ROS: Constitutional: no weight change, no fever ENT/Mouth: no sore throat, no rhinorrhea Eyes: no eye pain, no vision changes Cardiovascular: no chest pain, no dyspnea,  no edema, no palpitations Respiratory: + cough, no sputum, no wheezing Gastrointestinal: no nausea, no vomiting, no diarrhea, no constipation Genitourinary: no urinary incontinence, no dysuria, no hematuria Musculoskeletal: no arthralgias, no myalgias Skin: no skin lesions, no pruritus, Neuro: + weakness, no loss of consciousness, no syncope Psych: no anxiety, no depression, + decrease appetite Heme/Lymph: no bruising, no  bleeding  Hospital Course: He is status post bilateral inguinal repair with robotics via Dr. Windell Moment.  Vitals postop was remarkable for temperature of 97.2, respiration rate of 21, heart rate 81, blood pressure 133/84, satting at 94% on 3 L nasal cannula.  Assessment/Plan  Principal Problem:   Shortness of breath Active Problems:   Hypertension   OSA (obstructive sleep apnea)   CAD (coronary artery disease)   Mixed hyperlipidemia   Obesity   COPD (chronic obstructive pulmonary disease) (HCC)   CAD in native artery   Essential (primary) hypertension   Major depression in remission ([place])   At risk for obstructive sleep apnea   Shortness of breath -work-up in progress including acs vs pna vs copd versus secondary to insufflation of CO2 into the peritoneal cavity versus atelectasis New left bundle branch block previously right bundle branch block and previously incomplete right bundle bundle branch block - Low clinical suspicion for ACS as patient denies chest pain there was no ST elevation - We will order repeat EKG to evaluate left bundle branch block - We will order troponin high-sensitivity, x2 - BNP, procalcitonin, TSH, CMP, CBC, COVID respiratory panel - Encourage incentive spirometry, flutter valve, every hour while awake - RT communication consult for education of incentive spirometry and flutter valve use  Prolonged QT-work-up in progress, as above  Obesity and at risk for obstructive sleep apnea-CPAP nightly ordered  Hypertension-resumed ramipril 10 mg daily  CAD-resumed isosorbide mononitrate 30 mg daily, aspirin 81 mg daily, pravastatin 40 mg nightly, nitroglycerin sublingual 0.4 mg sublingual q. 5 minutes. - Plavix 35 mg p.o. daily resumed  Hyperlipidemia-pravastatin 40 mg nightly  GERD-pantoprazole 40 mg daily  COPD-Combivent 1 puff inhalation every 6 hours.  For wheezing, DuoNeb's every 6 hours.  For wheezing or shortness of breath  COVID test is  pending  Chart reviewed.   DVT prophylaxis: Enoxaparin 40 mg subcutaneous every 24 hours Code Status: Full code Diet: Heart healthy Family Communication: updated Mrs. Mobeen Kutzner at [phone] Disposition Plan: Pending clinical course Consults called: None at this time Admission status: observation, medsurg, telemetry  Past Medical History:  Diagnosis Date  . Adrenal adenoma, left   . Anxiety   . Aortic atherosclerosis ([place])   . Asthma without status asthmaticus   . Atherosclerotic cerebrovascular disease    Overview:  Small and facial weakness with minimal weakness   . CAD (coronary artery disease)    a. 05/2010 Inf STEMI/PCI: RCA 115m (3.5x24 Promus DES), LCX small, 100, D1 50ost, D2 40ost, EF 45-50%; b. 03/2013 Cath ([facility]): 40% mLAD, 20% stenosis mRCA (site of the previous stent); LVEF 55%.  . Cervical disc disease    a. s/p C3-4 fusion 2003.  Marland Kitchen Cervical myelopathy ([place])    a. 09/2016 Admit w/ left sided wkns->found to have progressive cervical disc dzs above and below prior fusioin site; b. 10/2016 s/p ant cervical diskectomy.  Marland Kitchen COPD (chronic obstructive pulmonary disease) ([place])   . Cryptogenic stroke ([place]) 05/2013    s/p acute L MCA territory infarct; b. 07/2013 s/p MDT Linq ILR-->No AFib to documented to date (09/2016).  . Current use of long term anticoagulation    DAPT therapy (ASA + clopidogrel)  . Depression   . Diabetes mellitus without complication ([place])   . Essential (primary) hypertension   . Gastritis   . GERD (gastroesophageal reflux disease)   . H/O insomnia   . History of cerebrovascular accident (CVA) with residual deficit 2015   facial drooping on one side of face is only residual since stroke  . IDA (iron deficiency anemia)   . Inguinal hernia   . Insomnia disorder   . Ischemic cardiomyopathy    a. 05/2010 LV gram: EF 45-50% @ time of inf MI; b. 09/2016 Echo: EF 50-55%, inf HK, Gr1 DD, mild MR.  . Mixed hyperlipidemia   . Nephrolithiasis   . Obesity    . OSA (obstructive sleep apnea)    no cpap  . Osteoarthritis   . STEMI (ST elevation myocardial infarction) ([place]) 06/07/2010   1 stent  . Tobacco abuse   . Urinary incontinence   . Varicose veins   . Vertigo    Past Surgical History:  Procedure Laterality Date  . ANTERIOR CERVICAL DECOMP/DISCECTOMY FUSION N/A 10/22/2016   Procedure: ANTERIOR CERVICAL DECOMPRESSION/DISCECTOMY FUSION 2 LEVELS C3-4, C6-7;  Surgeon: Meade Maw, MD;  Location: [facility] ORS;  Service: Neurosurgery;  Laterality: N/A;  . CARDIAC CATHETERIZATION  06/07/2010   50 % ostial narrowing in 1st diagonal from LAD, 2nd diagonal had 40% ostial narrowing; AV groove circumflex was diminutive and occluded proximally; RCA totally occluded in mid segment - stented with 3.5 x 52mm Promus Element DES  - See Media tab  . CARDIAC CATHETERIZATION  04/04/2013   40% mLAD, 20% mRCA at the site of the previous stent.  LVEF was 55%.  . COLONOSCOPY WITH PROPOFOL N/A 02/26/2015   Procedure: COLONOSCOPY WITH PROPOFOL;  Surgeon: Josefine Class, MD;  Location: [facility] ENDOSCOPY;  Service: Endoscopy;  Laterality: N/A;  . COLONOSCOPY WITH PROPOFOL N/A 03/16/2020   Procedure: COLONOSCOPY WITH PROPOFOL;  Surgeon: Lesly Rubenstein, MD;  Location: [facility] ENDOSCOPY;  Service: Endoscopy;  Laterality: N/A;  . ESOPHAGOGASTRODUODENOSCOPY  02/26/2015   Procedure: ESOPHAGOGASTRODUODENOSCOPY (EGD);  Surgeon: Rodman Key  Elmyra Ricks, MD;  Location: [facility] END[facility]COPY;  Service: Endoscopy;;  . ESOPHAGOGASTRODUODENOSCOPY (EGD) WITH PROPOFOL N/A 03/16/2020   Procedure: ESOPHAGOGASTRODUODENOSCOPY (EGD) WITH PROPOFOL;  Surgeon: Lesly Rubenstein, MD;  Location: [facility] ENDOSCOPY;  Service: Endoscopy;  Laterality: N/A;  . EYE SURGERY     cataract replaced  . LOOP RECORDER IMPLANT  08/16/13   Dr. Sallyanne Kuster   Social History:  reports that he quit smoking about 6 months ago. His smoking use included cigarettes. He has a 53.00 pack-year smoking history. He has never used  smokeless tobacco. He reports that he does not drink alcohol and does not use drugs.  Allergies  Allergen Reactions  . Penicillins Anaphylaxis, Hives and Nausea And Vomiting    As a child Has patient had a PCN reaction causing immediate rash, facial/tongue/throat swelling, SOB or lightheadedness with hypotension: No Has patient had a PCN reaction causing severe rash involving mucus membranes or skin necrosis: No Has patient had a PCN reaction that required hospitalization No Has patient had a PCN reaction occurring within the last 10 years: No If all of the above answers are "NO", then may proceed with Cephalosporin use.    Family History  Problem Relation Age of Onset  . Cancer Mother        oral  . Stroke Maternal Grandmother   . Diabetes Brother   . Coronary artery disease Brother    Family history: Family history reviewed and not pertinent  Prior to Admission medications   Medication Sig Start Date End Date Taking? Authorizing Provider  acetaminophen (TYLENOL) 500 MG tablet Take 1,000 mg by mouth every 8 (eight) hours as needed for mild pain.    Yes [provider]  aspirin 81 MG chewable tablet Chew 81 mg by mouth daily.   Yes [provider]  clopidogrel (PLAVIX) 75 MG tablet TAKE 1 TABLET(75 MG) BY MOUTH DAILY Patient taking differently: Take 75 mg by mouth daily. 04/17/20  Yes Croitoru, Mihai, MD  COMBIVENT RESPIMAT 20-100 MCG/ACT AERS respimat Inhale 1 puff into the lungs every 6 (six) hours as needed for wheezing. 01/02/16  Yes [provider]  ferrous sulfate 325 (65 FE) MG EC tablet Take 1 tablet (325 mg total) by mouth in the morning and at bedtime. Patient taking differently: Take 325 mg by mouth daily with breakfast. 01/03/20  Yes Croitoru, Mihai, MD  HYDROcodone-acetaminophen (NORCO) 5-325 MG tablet Take 1 tablet by mouth every 4 (four) hours as needed for up to 3 days for moderate pain. 05/23/20 05/26/20 Yes Herbert Pun, MD  isosorbide  mononitrate (IMDUR) 30 MG 24 hr tablet TAKE 1 TABLET(30 MG) BY MOUTH DAILY Patient taking differently: Take 30 mg by mouth daily. 04/17/20  Yes Croitoru, Mihai, MD  nitroGLYCERIN (NITROSTAT) 0.4 MG SL tablet PLACE 1 TABLET UNDER THE TONGUE EVERY 5 MINUTES AS NEEDED FOR CHEST PAIN Patient taking differently: Place 0.4 mg under the tongue every 5 (five) minutes as needed for chest pain. 09/19/19  Yes Croitoru, Mihai, MD  pantoprazole (PROTONIX) 40 MG tablet Take 40 mg by mouth daily.   Yes [provider]  PARoxetine (PAXIL) 40 MG tablet Take 40 mg by mouth daily.  11/16/13  Yes [provider]  pravastatin (PRAVACHOL) 40 MG tablet Take 1 tablet (40 mg total) by mouth daily. 06/10/19  Yes Croitoru, Mihai, MD  ramipril (ALTACE) 10 MG capsule TAKE 1 CAPSULE(10 MG) BY MOUTH DAILY Patient taking differently: Take 10 mg by mouth daily. 04/17/20  Yes Croitoru, Dani Gobble, MD  traZODone (DESYREL) 100 MG tablet Take 50 mg by mouth at bedtime as needed for sleep.    Yes [provider]  predniSONE (STERAPRED UNI-PAK 21 TAB) 10 MG (21) TBPK tablet Take 6 pills on day one then decrease by 1 pill each day Patient not taking: Reported on 05/17/2020 12/26/18   Versie Starks, PA-C  traMADol (ULTRAM) 50 MG tablet Take 1 tablet (50 mg total) by mouth every 6 (six) hours as needed. 12/26/18   Versie Starks, PA-C   Physical Exam: Vitals:   05/23/20 1830 05/23/20 1836 05/23/20 1854 05/23/20 2011  BP: (!) 153/108 (!) 132/91 133/77 139/76  Pulse: 78 81 78 72  Resp: (!) 25 19 20 18   Temp:  97.7 F (36.5 C) 97.8 F (36.6 C) 97.9 F (36.6 C)  TempSrc:   Oral Oral  SpO2: 94% 94% 97% 94%  Weight:      Height:       Constitutional: appears age-appropriate, NAD, calm, comfortable Eyes: PERRL, lids and conjunctivae normal ENMT: Mucous membranes are moist. Posterior pharynx clear of any exudate or lesions. Age-appropriate dentition. Hearing appropriate Neck: normal, supple, no masses, no  thyromegaly Respiratory: clear to auscultation bilaterally, no wheezing, no crackles. Normal respiratory effort. No accessory muscle use.  Cardiovascular: Regular rate and rhythm, no murmurs / rubs / gallops. No extremity edema. 2+ pedal pulses. No carotid bruits.  Abdomen: Obese abdomen, no tenderness, no masses palpated, no hepatosplenomegaly. Bowel sounds positive.  Multiple laparoscopic scars consistent with recent robotic procedure Musculoskeletal: no clubbing / cyanosis. No joint deformity upper and lower extremities. Good ROM, no contractures, no atrophy. Normal muscle tone.  Skin: no rashes, lesions, ulcers. No induration Neurologic: Sensation intact. Strength 5/5 in all 4.  Psychiatric: Normal judgment and insight. Alert and oriented x 3. Normal mood.   EKG: independently reviewed, showing sinus rhythm with rate of 82, left bundle branch block, QTC 568  Chest x-ray on Admission: I personally reviewed and I agree with radiologist reading as below.  DG Chest Port 1 View  Result Date: 05/23/2020 CLINICAL DATA:  Dyspnea postop EXAM: PORTABLE CHEST 1 VIEW COMPARISON:  05/18/2019, CT 06/08/2019, chest x-ray 12/03/2014 FINDINGS: Surgical hardware in the cervical spine. Recording device over the left chest. Emphysematous disease with linear atelectasis or scar in the right lower lung. Chronic scarring within the left mid to lower lung. There may be superimposed atelectasis at both lung bases. There are chronic bronchitic changes. Stable cardiomediastinal silhouette. Soft tissue emphysema in the left lower chest wall IMPRESSION: 1. Emphysematous disease with chronic scarring in the left lower lung. Suspect areas of acute superimposed atelectasis in the left greater than right lower lungs. There are diffuse bronchitic changes 2. Soft tissue emphysema in the left chest wall Electronically Signed   By: Donavan Foil M.D.   On: 05/23/2020 18:29   Labs on Admission: I have personally reviewed following  labs  CBC: Recent Labs  Lab 05/23/20 1942  WBC 7.6  NEUTROABS 7.3  HGB 12.6*  HCT 40.2  MCV 85.9  PLT 030   Basic Metabolic Panel: Recent Labs  Lab 05/23/20 1942  NA 136  K 4.4  CL 102  CO2 24  GLUCOSE 232*  BUN 22  CREATININE 1.17  CALCIUM 8.9   GFR: Estimated Creatinine Clearance: 69.7 mL/min (by C-G formula based on SCr of 1.17 mg/dL).  Liver Function Tests: Recent Labs  Lab 05/23/20 1942  AST 32  ALT 22  ALKPHOS 120  BILITOT 0.7  PROT 7.0  ALBUMIN 3.6   CBG: Recent Labs  Lab 05/23/20 1121 05/23/20 1540  GLUCAP 132* 192*   Lipid Profile: No results for input(s): CHOL, HDL, LDLCALC, TRIG, CHOLHDL, LDLDIRECT in the last 72 hours.  Thyroid Function Tests: Recent Labs    05/23/20 1942  TSH 1.274   Anemia Panel: No results for input(s): VITAMINB12, FOLATE, FERRITIN, TIBC, IRON, RETICCTPCT in the last 72 hours.  Urine analysis:    Component Value Date/Time   COLORURINE Straw 12/05/2013 1845   APPEARANCEUR Clear 10/16/2015 1104   LABSPEC 1.006 12/05/2013 1845   PHURINE 6.0 12/05/2013 1845   GLUCOSEU Negative 10/16/2015 1104   GLUCOSEU Negative 12/05/2013 1845   HGBUR 1+ 12/05/2013 1845   BILIRUBINUR Negative 10/16/2015 1104   BILIRUBINUR Negative 12/05/2013 1845   KETONESUR Negative 12/05/2013 1845   PROTEINUR Negative 10/16/2015 1104   PROTEINUR Negative 12/05/2013 1845   NITRITE Negative 10/16/2015 1104   NITRITE Negative 12/05/2013 1845   LEUKOCYTESUR Negative 10/16/2015 1104   LEUKOCYTESUR Negative 12/05/2013 1845   Louis Gutierrez N Laresa Oshiro D.O. [facility]  If 7PM-7AM, please contact overnight-coverage provider If 7AM-7PM, please contact day coverage provider [email or web address]  05/23/2020, 9:04 PM

## 2020-05-23 NOTE — Transfer of Care (Signed)
Immediate Anesthesia Transfer of Care Note  Patient: Louis Gutierrez  Procedure(s) Performed: XI ROBOTIC ASSISTED INGUINAL HERNIA REPAIR WITH MESH (Left Inguinal)  Patient Location: PACU  Anesthesia Type:General  Level of Consciousness: awake  Airway & Oxygen Therapy: Patient Spontanous Breathing and Patient connected to face mask oxygen  Post-op Assessment: Report given to RN and Post -op Vital signs reviewed and stable  Post vital signs: Reviewed and stable  Last Vitals:  Vitals Value Taken Time  BP 142/90 05/23/20 1545  Temp    Pulse 85 05/23/20 1545  Resp 18 05/23/20 1545  SpO2 97 % 05/23/20 1545  Vitals shown include unvalidated device data.  Last Pain:  Vitals:   05/23/20 1148  TempSrc: Temporal  PainSc: 0-No pain      Patients Stated Pain Goal: 0 (28/76/81 1572)  Complications: No complications documented.

## 2020-05-23 NOTE — Op Note (Signed)
Preoperative diagnosis: Left inguinal hernia.   Postoperative diagnosis: Left inguinal hernia.  Procedure: Robotic assisted Laparoscopic Transabdominal preperitoneal laparoscopic (TAPP) repair of Left inguinal hernia.  Anesthesia: GETA  Surgeon: Dr. Windell Moment  Wound Classification: Clean  Indications:  Patient is a 70 y.o. male developed a symptomatic left inguinal hernia. Repair was indicated.  Findings: 1. Left indirect Inguinal hernia identified and repaired.     2. Vas deferens and cord structures identified and preserved 3. Bard 3D Max mesh used for repair 4. Adequate hemostasis.  5. No right inguinal hernia identified.    Description of procedure: The patient was taken to the operating room and the correct side of surgery was verified. The patient was placed supine with arms tucked at the sides. After obtaining adequate anesthesia, the patient's abdomen was prepped and draped in standard sterile fashion. The patient was placed in the Trendelenburg position. A time-out was completed verifying correct patient, procedure, site, positioning, and implant(s) and/or special equipment prior to beginning this procedure. A Veress needle was placed at the umbilicus and pneumoperitoneum created with insufflation of carbon dioxide to 15 mmHg. After the Veress needle was removed, an 8-mm trocar was placed on epigastric area and the 30 angled laparoscope inserted. Two 8-mm trocars were then placed lateral to the rectus sheath under direct visualization. Both inguinal regions were inspected and the median umbilical ligament, medial umbilical ligament, and lateral umbilical fold were identified.  The robotic arms were docked. The robotic scope was inserted and the pelvic area anatomy targeted.  The peritoneum was incised with scissors along a line 5 cm above the superior edge of the hernia defect, extending from the median umbilical ligament to the anterior superior iliac spine. The peritoneal  flap was mobilized inferiorly using blunt and sharp dissection. The inferior epigastric vessels were exposed and the pubic symphysis was identified. Cooper's ligament was dissected to its junction with the iliac vein. The dissection was continued inferiorly to the iliopubic tract, with care taken to avoid injury to the femoral branch of the genitofemoral nerve and the lateral femoral cutaneous nerve. The cord structures were parietalized. The hernia was identified and reduced by gentle traction.  The indirect hernia sac was noted mobilized from the cord structures and reduced into the peritoneal cavity.  A large piece of mesh was rolled longitudinally into a compact cylinder and passed through a trocar. The cylinder was placed along the inferior aspect of the working space and unrolled into place to completely cover the direct, indirect, and femoral spaces. The mesh was secured into place superiorly to the anterior abdominal wall and inferiorly and medially to Cooper's ligament with absorbable sutures. Care was taken to avoid the inferolateral triangles containing the iliac vessels and genital nerves. The peritoneal flap was closed over the mesh and secured with suture in similar positions of safety. After ensuring adequate hemostasis, the trocars were removed and the pneumoperitoneum allowed to escape. The trocar incisions were closed using monocryl and skin adhesive dressings applied.  The patient tolerated the procedure well and was taken to the postanesthesia care unit in stable condition.   Specimen: None  Complications: None  Estimated Blood Loss: 25 mL

## 2020-05-23 NOTE — Anesthesia Preprocedure Evaluation (Addendum)
Anesthesia Evaluation  Patient identified by MRN, date of birth, ID band Patient awake    Reviewed: Allergy & Precautions, NPO status , Patient's Chart, lab work & pertinent test results  Airway Mallampati: II  TM Distance: >3 FB Neck ROM: Limited   Comment: C-spine fusion--C2-3--for radicular pain. Limited extension and flexion. Dental  (+) Upper Dentures, Lower Dentures,    Pulmonary asthma , sleep apnea and Continuous Positive Airway Pressure Ventilation , COPD,  COPD inhaler, Current Smoker, former smoker,  Pack per day for many years.   Pulmonary exam normal  + decreased breath sounds      Cardiovascular Exercise Tolerance: Poor hypertension, Pt. on medications + CAD, + Past MI and + Peripheral Vascular Disease  Normal cardiovascular exam Rhythm:Regular Rate:Normal  Hx of MI in 2012, and a CVA, on Plavix and ASA--held for these procedures.   Neuro/Psych PSYCHIATRIC DISORDERS Anxiety Depression C-spine fusion. CVA, No Residual Symptoms    GI/Hepatic negative GI ROS, Neg liver ROS, GERD  ,  Endo/Other  negative endocrine ROSdiabetes  Renal/GU Renal diseaseStones.  negative genitourinary   Musculoskeletal  (+) Arthritis , Osteoarthritis,    Abdominal Normal abdominal exam  (+)  Abdomen: soft.    Peds negative pediatric ROS (+)  Hematology negative hematology ROS (+) anemia ,   Anesthesia Other Findings Past Medical History: No date: Asthma without status asthmaticus No date: Atherosclerotic cerebrovascular disease     Comment:  Overview:  Small and facial weakness with minimal               weakness  No date: CAD (coronary artery disease)     Comment:  a. 05/2010 Inf STEMI/PCI: RCA 1101m (3.5x24 Promus DES),               LCX small, 100, D1 50ost, D2 40ost, EF 45-50%; b. 03/2013               Cath ([facility]): Report not available - D/C summary says              no significant dzs. No date: Cervical disc  disease     Comment:  a. s/p C3-4 fusion 2003. No date: Cervical myelopathy ([place])     Comment:  a. 09/2016 Admit w/ left sided wkns->found to have               progressive cervical disc dzs above and below prior               fusioin site. No date: COPD (chronic obstructive pulmonary disease) ([place]) No date: Cryptogenic stroke [facility])     Comment:  a. 05/2013 s/p acute L MCA territory infarct; b. 07/2013               s/p MDT Linq ILR-->No AFib to documented to date               (09/2016). No date: Depression No date: Essential (primary) hypertension No date: H/O insomnia No date: Inguinal hernia No date: Ischemic cardiomyopathy     Comment:  a. 05/2010 LV gram: EF 45-50% @ time of inf MI; b. 09/2016              Echo: EF 50-55%, inf HK, Gr1 DD, mild MR. No date: Mixed hyperlipidemia No date: Nephrolithiasis No date: Obesity No date: OSA (obstructive sleep apnea) No date: Osteoarthritis 06/07/2010: STEMI (ST elevation myocardial infarction) ([place]) No date: Tobacco abuse No date: Urinary incontinence No date: Varicose veins  Reproductive/Obstetrics  Anesthesia Physical  Anesthesia Plan  ASA: III  Anesthesia Plan: General   Post-op Pain Management:    Induction: Intravenous  PONV Risk Score and Plan:   Airway Management Planned: Oral ETT  Additional Equipment:   Intra-op Plan:   Post-operative Plan: Extubation in OR  Informed Consent: I have reviewed the patients History and Physical, chart, labs and discussed the procedure including the risks, benefits and alternatives for the proposed anesthesia with the patient or authorized representative who has indicated his/her understanding and acceptance.     Dental advisory given  Plan Discussed with: CRNA and Surgeon  Anesthesia Plan Comments:         Anesthesia Quick Evaluation

## 2020-05-23 NOTE — Anesthesia Procedure Notes (Signed)
Procedure Name: Intubation Performed by: Fredderick Phenix, CRNA Pre-anesthesia Checklist: Patient identified, Emergency Drugs available, Suction available and Patient being monitored Patient Re-evaluated:Patient Re-evaluated prior to induction Oxygen Delivery Method: Circle system utilized Preoxygenation: Pre-oxygenation with 100% oxygen Induction Type: IV induction Ventilation: Mask ventilation without difficulty Laryngoscope Size: Mac and 4 Grade View: Grade II Tube type: Oral Tube size: 7.5 mm Number of attempts: 1 Airway Equipment and Method: Stylet and Oral airway Placement Confirmation: ETT inserted through vocal cords under direct vision,  positive ETCO2 and breath sounds checked- equal and bilateral Secured at: 21 cm Tube secured with: Tape Dental Injury: Teeth and Oropharynx as per pre-operative assessment

## 2020-05-24 ENCOUNTER — Encounter: Payer: Self-pay | Admitting: General Surgery

## 2020-05-24 DIAGNOSIS — Z87891 Personal history of nicotine dependence: Secondary | ICD-10-CM | POA: Diagnosis not present

## 2020-05-24 DIAGNOSIS — I2510 Atherosclerotic heart disease of native coronary artery without angina pectoris: Secondary | ICD-10-CM | POA: Diagnosis not present

## 2020-05-24 DIAGNOSIS — Z20822 Contact with and (suspected) exposure to covid-19: Secondary | ICD-10-CM | POA: Diagnosis not present

## 2020-05-24 DIAGNOSIS — R0602 Shortness of breath: Secondary | ICD-10-CM | POA: Diagnosis not present

## 2020-05-24 DIAGNOSIS — Z7982 Long term (current) use of aspirin: Secondary | ICD-10-CM | POA: Diagnosis not present

## 2020-05-24 DIAGNOSIS — E119 Type 2 diabetes mellitus without complications: Secondary | ICD-10-CM | POA: Diagnosis not present

## 2020-05-24 DIAGNOSIS — J449 Chronic obstructive pulmonary disease, unspecified: Secondary | ICD-10-CM | POA: Diagnosis not present

## 2020-05-24 DIAGNOSIS — K4090 Unilateral inguinal hernia, without obstruction or gangrene, not specified as recurrent: Secondary | ICD-10-CM | POA: Diagnosis not present

## 2020-05-24 DIAGNOSIS — I10 Essential (primary) hypertension: Secondary | ICD-10-CM | POA: Diagnosis not present

## 2020-05-24 DIAGNOSIS — J45909 Unspecified asthma, uncomplicated: Secondary | ICD-10-CM | POA: Diagnosis not present

## 2020-05-24 LAB — CBC
HCT: 39.2 % (ref 39.0–52.0)
Hemoglobin: 12.4 g/dL — ABNORMAL LOW (ref 13.0–17.0)
MCH: 27.3 pg (ref 26.0–34.0)
MCHC: 31.6 g/dL (ref 30.0–36.0)
MCV: 86.3 fL (ref 80.0–100.0)
Platelets: 265 10*3/uL (ref 150–400)
RBC: 4.54 MIL/uL (ref 4.22–5.81)
RDW: 15.1 % (ref 11.5–15.5)
WBC: 9.7 10*3/uL (ref 4.0–10.5)
nRBC: 0 % (ref 0.0–0.2)

## 2020-05-24 LAB — BASIC METABOLIC PANEL
Anion gap: 8 (ref 5–15)
BUN: 23 mg/dL (ref 8–23)
CO2: 27 mmol/L (ref 22–32)
Calcium: 9.2 mg/dL (ref 8.9–10.3)
Chloride: 102 mmol/L (ref 98–111)
Creatinine, Ser: 1.09 mg/dL (ref 0.61–1.24)
GFR, Estimated: >60 mL/min (ref >60)
Glucose, Bld: 187 mg/dL — ABNORMAL HIGH (ref 70–99)
Potassium: 4.7 mmol/L (ref 3.5–5.1)
Sodium: 137 mmol/L (ref 135–145)

## 2020-05-24 LAB — D-DIMER, QUANTITATIVE: D-Dimer, Quant: 1.2 ug/mL-FEU — ABNORMAL HIGH (ref 0.00–0.50)

## 2020-05-24 LAB — HIV ANTIBODY (ROUTINE TESTING W REFLEX): HIV Screen 4th Generation wRfx: NONREACTIVE

## 2020-05-24 NOTE — Discharge Summary (Signed)
[facility] [place] NAME: Louis Gutierrez    MR#:  [id number]  DATE OF BIRTH:  10-Feb-1950  DATE OF ADMISSION:  05/23/2020 ADMITTING PHYSICIAN: Amy N Cox, DO  DATE OF DISCHARGE: 05/24/2020  PRIMARY CARE PHYSICIAN: Kirk Ruths, MD    ADMISSION DIAGNOSIS:  Shortness of breath [R06.02]  DISCHARGE DIAGNOSIS:  Post-op shortness of breath with history of emphysema improved  SECONDARY DIAGNOSIS:   Past Medical History:  Diagnosis Date  . Adrenal adenoma, left   . Anxiety   . Aortic atherosclerosis ([place])   . Asthma without status asthmaticus   . Atherosclerotic cerebrovascular disease    Overview:  Small and facial weakness with minimal weakness   . CAD (coronary artery disease)    a. 05/2010 Inf STEMI/PCI: RCA 158m (3.5x24 Promus DES), LCX small, 100, D1 50ost, D2 40ost, EF 45-50%; b. 03/2013 Cath ([facility]): 40% mLAD, 20% stenosis mRCA (site of the previous stent); LVEF 55%.  . Cervical disc disease    a. s/p C3-4 fusion 2003.  Marland Kitchen Cervical myelopathy ([place])    a. 09/2016 Admit w/ left sided wkns->found to have progressive cervical disc dzs above and below prior fusioin site; b. 10/2016 s/p ant cervical diskectomy.  Marland Kitchen COPD (chronic obstructive pulmonary disease) ([place])   . Cryptogenic stroke ([place]) 05/2013    s/p acute L MCA territory infarct; b. 07/2013 s/p MDT Linq ILR-->No AFib to documented to date (09/2016).  . Current use of long term anticoagulation    DAPT therapy (ASA + clopidogrel)  . Depression   . Diabetes mellitus without complication ([place])   . Essential (primary) hypertension   . Gastritis   . GERD (gastroesophageal reflux disease)   . H/O insomnia   . History of cerebrovascular accident (CVA) with residual deficit 2015   facial drooping on one side of face is only residual since stroke  . IDA (iron deficiency anemia)   . Inguinal hernia   . Insomnia disorder   . Ischemic cardiomyopathy    a. 05/2010 LV gram: EF 45-50% @  time of inf MI; b. 09/2016 Echo: EF 50-55%, inf HK, Gr1 DD, mild MR.  . Mixed hyperlipidemia   . Nephrolithiasis   . Obesity   . OSA (obstructive sleep apnea)    no cpap  . Osteoarthritis   . STEMI (ST elevation myocardial infarction) ([place]) 06/07/2010   1 stent  . Tobacco abuse   . Urinary incontinence   . Varicose veins   . Vertigo     HOSPITAL COURSE:  Louis Gutierrez is a 70 y.o. male with medical history significant for bilateral inguinal hernia, obesity, hyperlipidemia, depression, hypertension, at risk for obstructive sleep apnea, CAD, was admitted to outpatient surgery for bilateral inguinal hernia repair with robotics. Per report, after patient was extubated he developed shortness of breath requiring nasal cannula.  Acute hypoxic respiratory failure post surgery/anesthesia in the setting of emphysema/COPD  - Low clinical suspicion for ACS as patient denies chest pain there was no ST elevation--troponin x1 negative -- hemodynamically stable. Patient denies any chest pain today. He feels at baseline for shortness of breath. -BNP normal. -- Chest x-ray shows emphysema with mild atelectasis. -- PRN nebs and inhalers. -- Patient was cleared for surgery by cardiology recently. He follows with Dr. Dani Gobble croitoru --per cardiology note April 22 patient has chronic dyspnea on exertion which is unchangeable for several months. He can complete 4 METS without angina complaints. -- I have recommended patient  to make follow-up appointment with cardiology and review the EKG that was done on 54 2022.  -- all cardiac meds have been resumed as before  --sats 93% on RA  obesity and at risk for obstructive sleep apnea-CPAP nightly ordered  Hypertension-resumed ramipril   CAD-resumed isosorbide mononitrate, aspirin, pravastatin, nitroglycerin sublingual 0.4 mg sublingual q. 5 minutes, Plavix  Hyperlipidemia-pravastatin 40 mg nightly  GERD-pantoprazole 40 mg daily  COPD/email-Combivent 1  puff inhalation every 6 hours.  For wheezing, DuoNeb's every 6 hours.    COVID test negative  Overall appears to be near baseline. Patient denies any symptoms. Hemodynamically stable. Will discharge to home. Dr. Windell Moment is aware.  CONSULTS OBTAINED:  Treatment Team:  Herbert Pun, MD  DRUG ALLERGIES:   Allergies  Allergen Reactions  . Penicillins Anaphylaxis, Hives and Nausea And Vomiting    As a child Has patient had a PCN reaction causing immediate rash, facial/tongue/throat swelling, SOB or lightheadedness with hypotension: No Has patient had a PCN reaction causing severe rash involving mucus membranes or skin necrosis: No Has patient had a PCN reaction that required hospitalization No Has patient had a PCN reaction occurring within the last 10 years: No If all of the above answers are "NO", then may proceed with Cephalosporin use.     DISCHARGE MEDICATIONS:   Allergies as of 05/24/2020      Reactions   Penicillins Anaphylaxis, Hives, Nausea And Vomiting   As a child Has patient had a PCN reaction causing immediate rash, facial/tongue/throat swelling, SOB or lightheadedness with hypotension: No Has patient had a PCN reaction causing severe rash involving mucus membranes or skin necrosis: No Has patient had a PCN reaction that required hospitalization No Has patient had a PCN reaction occurring within the last 10 years: No If all of the above answers are "NO", then may proceed with Cephalosporin use.      Medication List    STOP taking these medications   predniSONE 10 MG (21) Tbpk tablet Commonly known as: STERAPRED UNI-PAK 21 TAB     TAKE these medications   acetaminophen 500 MG tablet Commonly known as: TYLENOL Take 1,000 mg by mouth every 8 (eight) hours as needed for mild pain.   aspirin 81 MG chewable tablet Chew 81 mg by mouth daily.   clopidogrel 75 MG tablet Commonly known as: PLAVIX TAKE 1 TABLET(75 MG) BY MOUTH DAILY What changed: See  the new instructions.   Combivent Respimat 20-100 MCG/ACT Aers respimat Generic drug: Ipratropium-Albuterol Inhale 1 puff into the lungs every 6 (six) hours as needed for wheezing.   ferrous sulfate 325 (65 FE) MG EC tablet Take 1 tablet (325 mg total) by mouth in the morning and at bedtime. What changed: when to take this   HYDROcodone-acetaminophen 5-325 MG tablet Commonly known as: Norco Take 1 tablet by mouth every 4 (four) hours as needed for up to 3 days for moderate pain.   isosorbide mononitrate 30 MG 24 hr tablet Commonly known as: IMDUR TAKE 1 TABLET(30 MG) BY MOUTH DAILY What changed: See the new instructions.   nitroGLYCERIN 0.4 MG SL tablet Commonly known as: NITROSTAT PLACE 1 TABLET UNDER THE TONGUE EVERY 5 MINUTES AS NEEDED FOR CHEST PAIN What changed: See the new instructions.   pantoprazole 40 MG tablet Commonly known as: PROTONIX Take 40 mg by mouth daily.   PARoxetine 40 MG tablet Commonly known as: PAXIL Take 40 mg by mouth daily.   pravastatin 40 MG tablet Commonly known as: PRAVACHOL Take  1 tablet (40 mg total) by mouth daily.   ramipril 10 MG capsule Commonly known as: ALTACE TAKE 1 CAPSULE(10 MG) BY MOUTH DAILY What changed: See the new instructions.   traMADol 50 MG tablet Commonly known as: ULTRAM Take 1 tablet (50 mg total) by mouth every 6 (six) hours as needed.   traZODone 100 MG tablet Commonly known as: DESYREL Take 50 mg by mouth at bedtime as needed for sleep.       If you experience worsening of your admission symptoms, develop shortness of breath, life threatening emergency, suicidal or homicidal thoughts you must seek medical attention immediately by calling 911 or calling your MD immediately  if symptoms less severe.  You Must read complete instructions/literature along with all the possible adverse reactions/side effects for all the Medicines you take and that have been prescribed to you. Take any new Medicines after you have  completely understood and accept all the possible adverse reactions/side effects.   Please note  You were cared for by a hospitalist during your hospital stay. If you have any questions about your discharge medications or the care you received while you were in the hospital after you are discharged, you can call the unit and asked to speak with the hospitalist on call if the hospitalist that took care of you is not available. Once you are discharged, your primary care physician will handle any further medical issues. Please note that NO REFILLS for any discharge medications will be authorized once you are discharged, as it is imperative that you return to your primary care physician (or establish a relationship with a primary care physician if you do not have one) for your aftercare needs so that they can reassess your need for medications and monitor your lab values. Today   SUBJECTIVE   No new plans. Tolerated breakfast. Walking to the bathroom in bed back and forth. Has chronic dyspnea on exertion. Sats 93 on room air  VITAL SIGNS:  Blood pressure (!) 152/82, pulse 89, temperature 97.9 F (36.6 C), temperature source Oral, resp. rate 20, height 5\' 11"  (1.803 m), weight 93.9 kg, SpO2 92 %.  I/O:    Intake/Output Summary (Last 24 hours) at 05/24/2020 1037 Last data filed at 05/24/2020 1032 Gross per 24 hour  Intake 1530 ml  Output 1470 ml  Net 60 ml    PHYSICAL EXAMINATION:  GENERAL:  70 y.o.-year-old patient lying in the bed with no acute distress. obese LUNGS: Normal breath sounds bilaterally, no wheezing, rales,rhonchi or crepitation. No use of accessory muscles of respiration.  CARDIOVASCULAR: S1, S2 normal. No murmurs, rubs, or gallops.  ABDOMEN: Soft, non-tender, non-distended. Bowel sounds present. No organomegaly or mass. Surgical incision+ clean EXTREMITIES: No pedal edema, cyanosis, or clubbing.  NEUROLOGIC: Cranial nerves II through XII are intact. Muscle strength 5/5 in all  extremities. Sensation intact. Gait not checked.  PSYCHIATRIC: The patient is alert and oriented x 3.  SKIN: No obvious rash, lesion, or ulcer.   DATA REVIEW:   CBC  Recent Labs  Lab 05/24/20 0603  WBC 9.7  HGB 12.4*  HCT 39.2  PLT 265    Chemistries  Recent Labs  Lab 05/23/20 1942 05/24/20 0603  NA 136 137  K 4.4 4.7  CL 102 102  CO2 24 27  GLUCOSE 232* 187*  BUN 22 23  CREATININE 1.17 1.09  CALCIUM 8.9 9.2  AST 32  --   ALT 22  --   ALKPHOS 120  --  BILITOT 0.7  --     Microbiology Results   Recent Results (from the past 240 hour(s))  SARS CORONAVIRUS 2 (TAT 6-24 HRS) Nasopharyngeal Nasopharyngeal Swab     Status: None   Collection Time: 05/21/20 10:10 AM   Specimen: Nasopharyngeal Swab  Result Value Ref Range Status   SARS Coronavirus 2 NEGATIVE NEGATIVE Final    Comment: (NOTE) SARS-CoV-2 target nucleic acids are NOT DETECTED.  The SARS-CoV-2 RNA is generally detectable in upper and lower respiratory specimens during the acute phase of infection. Negative results do not preclude SARS-CoV-2 infection, do not rule out co-infections with other pathogens, and should not be used as the sole basis for treatment or other patient management decisions. Negative results must be combined with clinical observations, patient history, and epidemiological information. The expected result is Negative.  Fact Sheet for Patients: SugarRoll.be  Fact Sheet for Healthcare Providers: https://www.woods-mathews.com/  This test is not yet approved or cleared by the [place] FDA and  has been authorized for detection and/or diagnosis of SARS-CoV-2 by FDA under an Emergency Use Authorization (EUA). This EUA will remain  in effect (meaning this test can be used) for the duration of the COVID-19 declaration under Se ction 564(b)(1) of the Act, 21 U.S.C. section 360bbb-3(b)(1), unless the authorization is terminated or revoked  sooner.  Performed at [facility], [place] [address] [place], [place] [address]   Resp Panel by RT-PCR (Flu A&B, Covid) Nasopharyngeal Swab     Status: None   Collection Time: 05/23/20  8:15 PM   Specimen: Nasopharyngeal Swab; Nasopharyngeal(NP) swabs in vial transport medium  Result Value Ref Range Status   SARS Coronavirus 2 by RT PCR NEGATIVE NEGATIVE Final    Comment: (NOTE) SARS-CoV-2 target nucleic acids are NOT DETECTED.  The SARS-CoV-2 RNA is generally detectable in upper respiratory specimens during the acute phase of infection. The lowest concentration of SARS-CoV-2 viral copies this assay can detect is 138 copies/mL. A negative result does not preclude SARS-Cov-2 infection and should not be used as the sole basis for treatment or other patient management decisions. A negative result may occur with  improper specimen collection/handling, submission of specimen other than nasopharyngeal swab, presence of viral mutation(s) within the areas targeted by this assay, and inadequate number of viral copies(<138 copies/mL). A negative result must be combined with clinical observations, patient history, and epidemiological information. The expected result is Negative.  Fact Sheet for Patients:  EntrepreneurPulse.com.au  Fact Sheet for Healthcare Providers:  IncredibleEmployment.be  This test is no t yet approved or cleared by the [place] FDA and  has been authorized for detection and/or diagnosis of SARS-CoV-2 by FDA under an Emergency Use Authorization (EUA). This EUA will remain  in effect (meaning this test can be used) for the duration of the COVID-19 declaration under Section 564(b)(1) of the Act, 21 U.S.C.section 360bbb-3(b)(1), unless the authorization is terminated  or revoked sooner.       Influenza A by PCR NEGATIVE NEGATIVE Final   Influenza B by PCR NEGATIVE NEGATIVE Final    Comment: (NOTE) The Xpert Xpress  SARS-CoV-2/FLU/RSV plus assay is intended as an aid in the diagnosis of influenza from Nasopharyngeal swab specimens and should not be used as a sole basis for treatment. Nasal washings and aspirates are unacceptable for Xpert Xpress SARS-CoV-2/FLU/RSV testing.  Fact Sheet for Patients: EntrepreneurPulse.com.au  Fact Sheet for Healthcare Providers: IncredibleEmployment.be  This test is not yet approved or cleared by the [place] and  has been authorized for detection and/or diagnosis of SARS-CoV-2 by FDA under an Emergency Use Authorization (EUA). This EUA will remain in effect (meaning this test can be used) for the duration of the COVID-19 declaration under Section 564(b)(1) of the Act, 21 U.S.C. section 360bbb-3(b)(1), unless the authorization is terminated or revoked.  Performed at [facility], [address] [place], [place] [address]     RADIOLOGY:  [facility] Chest Port 1 View  Result Date: 05/23/2020 CLINICAL DATA:  Dyspnea postop EXAM: PORTABLE CHEST 1 VIEW COMPARISON:  05/18/2019, CT 06/08/2019, chest x-ray 12/03/2014 FINDINGS: Surgical hardware in the cervical spine. Recording device over the left chest. Emphysematous disease with linear atelectasis or scar in the right lower lung. Chronic scarring within the left mid to lower lung. There may be superimposed atelectasis at both lung bases. There are chronic bronchitic changes. Stable cardiomediastinal silhouette. Soft tissue emphysema in the left lower chest wall IMPRESSION: 1. Emphysematous disease with chronic scarring in the left lower lung. Suspect areas of acute superimposed atelectasis in the left greater than right lower lungs. There are diffuse bronchitic changes 2. Soft tissue emphysema in the left chest wall Electronically Signed   By: Donavan Foil M.D.   On: 05/23/2020 18:29     CODE STATUS:     Code Status Orders  (From admission, onward)         Start      Ordered   05/23/20 1754  Full code  Continuous        05/23/20 1756        Code Status History    Date Active Date Inactive Code Status Order ID Comments User Context   10/22/2016 1453 10/23/2016 1526 Full Code [id number]  Gelene Mink Inpatient   10/07/2016 2120 10/10/2016 1700 Full Code [id number]  Harrie Foreman, MD Inpatient   Advance Care Planning Activity       TOTAL TIME TAKING CARE OF THIS PATIENT: *40* minutes.    Fritzi Mandes M.D  [facility]    CC: Primary care physician; Kirk Ruths, MD

## 2020-05-24 NOTE — TOC Initial Note (Signed)
Transition of Care [facility]) - Initial/Assessment Note    Patient Details  Name: Louis Gutierrez MRN: [id number] Date of Birth: 04-Mar-1950  Transition of Care [facility]) CM/SW Contact:    Alberteen Sam, LCSW Phone Number: 05/24/2020, 11:59 AM  Clinical Narrative:                  CSW spoke with patient at bedside for heart failure home health screen. Patient reports he lives with family members who are supportive, reports they also provide transportation to and from [place] appointments. Patient reports he does have a scale at home and is aware of weight changes, reports he follows up with his cardiologist and PCP regularly. Reports family is picking up today at discharge.   No discharge needs identified at this time.   CSW signing off.   Expected Discharge Plan: Home/Self Care Barriers to Discharge: No Barriers Identified   Patient Goals and CMS Choice Patient states their goals for this hospitalization and ongoing recovery are:: to go home CMS Medicare.gov Compare Post Acute Care list provided to:: Patient Choice offered to / list presented to : Patient  Expected Discharge Plan and Services Expected Discharge Plan: Home/Self Care       Living arrangements for the past 2 months: Single Family Home Expected Discharge Date: 05/24/20                                    Prior Living Arrangements/Services Living arrangements for the past 2 months: Single Family Home Lives with:: Adult Children,Spouse   Do you feel safe going back to the place where you live?: Yes      Need for Family Participation in Patient Care: Yes (Comment) Care giver support system in place?: Yes (comment)      Activities of Daily Living Home Assistive Devices/Equipment: None ADL Screening (condition at time of admission) Patient's cognitive ability adequate to safely complete daily activities?: Yes Is the patient deaf or have difficulty hearing?: No Does the patient have difficulty seeing, even  when wearing glasses/contacts?: No Does the patient have difficulty concentrating, remembering, or making decisions?: No Patient able to express need for assistance with ADLs?: Yes Does the patient have difficulty dressing or bathing?: No Independently performs ADLs?: Yes (appropriate for developmental age) Does the patient have difficulty walking or climbing stairs?: No Weakness of Legs: None Weakness of Arms/Hands: None  Permission Sought/Granted                  Emotional Assessment Appearance:: Appears stated age Attitude/Demeanor/Rapport: Gracious Affect (typically observed): Calm Orientation: : Oriented to Self,Oriented to Place,Oriented to Situation Alcohol / Substance Use: Not Applicable Psych Involvement: No (comment)  Admission diagnosis:  Shortness of breath [R06.02] Patient Active Problem List   Diagnosis Date Noted  . Shortness of breath 05/23/2020  . At risk for obstructive sleep apnea 05/23/2020  . Cervical myelopathy ([place]) 10/22/2016  . Spinal stenosis 10/10/2016  . Left inguinal hernia 02/05/2016  . History of arterial ischemic stroke 01/31/2016  . Left Adrenal cortical adenoma 10/16/2015  . Nephrolithiasis 10/16/2015  . Prostate cancer screening 10/16/2015  . Renal cysts, congenital, bilateral 10/16/2015  . Microscopic hematuria 10/16/2015  . 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 Cervical pain 12/08/2014  . Atherosclerotic cerebrovascular disease 06/09/2014  . Chronic obstructive pulmonary disease ([place]) 06/09/2014  . CAD in native artery 06/09/2014  . Essential (primary) hypertension 06/09/2014  .  Blood glucose elevated 06/09/2014  . Major depression in remission ([employer]) 06/09/2014  . 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 Chondrocalcinosis of knee 04/20/2014  . Primary osteoarthritis of both knees 04/20/2014  . Status post placement of implantable loop recorder 08/24/2013  . Stroke of unknown cause  ([place]) 08/06/2013  . Tobacco abuse 06/30/2012  . COPD (chronic obstructive pulmonary disease) ([place]) 06/30/2012  . OSA (obstructive sleep apnea)   . CAD (coronary artery disease)   . Mixed hyperlipidemia   . Obesity   . Hypertension 06/10/2010  . STEMI (ST elevation myocardial infarction) ([place]) 06/07/2010   PCP:  Kirk Ruths, MD Pharmacy:   [facility], [place] [address] Phone: [phone] Fax: [place], [place] - [address] [place] [address] Phone: [phone] Fax: [facility] [place], [place] [address] [place] [address] Phone: [phone] Fax: [phone]     Social Determinants of Health (SDOH) Interventions    Readmission Risk Interventions No flowsheet data found.

## 2020-05-24 NOTE — Plan of Care (Signed)
  Problem: Education: Goal: Knowledge of General Education information will improve Description: Including pain rating scale, medication(s)/side effects and non-pharmacologic comfort measures 05/24/2020 1335 by Orvan Seen, RN Outcome: Completed/Met 05/24/2020 1335 by Orvan Seen, RN Outcome: Progressing   Problem: Health Behavior/Discharge Planning: Goal: Ability to manage health-related needs will improve 05/24/2020 1335 by Orvan Seen, RN Outcome: Completed/Met 05/24/2020 1335 by Orvan Seen, RN Outcome: Progressing   Problem: Clinical Measurements: Goal: Ability to maintain clinical measurements within normal limits will improve 05/24/2020 1335 by Orvan Seen, RN Outcome: Completed/Met 05/24/2020 1335 by Orvan Seen, RN Outcome: Progressing Goal: Will remain free from infection 05/24/2020 1335 by Orvan Seen, RN Outcome: Completed/Met 05/24/2020 1335 by Orvan Seen, RN Outcome: Progressing Goal: Diagnostic test results will improve 05/24/2020 1335 by Orvan Seen, RN Outcome: Completed/Met 05/24/2020 1335 by Orvan Seen, RN Outcome: Progressing Goal: Respiratory complications will improve 05/24/2020 1335 by Orvan Seen, RN Outcome: Completed/Met 05/24/2020 1335 by Orvan Seen, RN Outcome: Progressing Goal: Cardiovascular complication will be avoided 05/24/2020 1335 by Orvan Seen, RN Outcome: Completed/Met 05/24/2020 1335 by Orvan Seen, RN Outcome: Progressing   Problem: Activity: Goal: Risk for activity intolerance will decrease 05/24/2020 1335 by Orvan Seen, RN Outcome: Completed/Met 05/24/2020 1335 by Orvan Seen, RN Outcome: Progressing   Problem: Nutrition: Goal: Adequate nutrition will be maintained 05/24/2020 1335 by Orvan Seen, RN Outcome: Completed/Met 05/24/2020 1335 by Orvan Seen, RN Outcome: Progressing   Problem: Coping: Goal: Level of anxiety will decrease 05/24/2020 1335 by Orvan Seen, RN Outcome: Completed/Met 05/24/2020 1335 by Orvan Seen, RN Outcome: Progressing   Problem: Elimination: Goal: Will not experience complications related to bowel motility 05/24/2020 1335 by Orvan Seen, RN Outcome: Completed/Met 05/24/2020 1335 by Orvan Seen, RN Outcome: Progressing Goal: Will not experience complications related to urinary retention 05/24/2020 1335 by Orvan Seen, RN Outcome: Completed/Met 05/24/2020 1335 by Orvan Seen, RN Outcome: Progressing   Problem: Pain Managment: Goal: General experience of comfort will improve 05/24/2020 1335 by Orvan Seen, RN Outcome: Completed/Met 05/24/2020 1335 by Orvan Seen, RN Outcome: Progressing   Problem: Safety: Goal: Ability to remain free from injury will improve 05/24/2020 1335 by Orvan Seen, RN Outcome: Completed/Met 05/24/2020 1335 by Orvan Seen, RN Outcome: Progressing   Problem: Skin Integrity: Goal: Risk for impaired skin integrity will decrease 05/24/2020 1335 by Orvan Seen, RN Outcome: Completed/Met 05/24/2020 1335 by Orvan Seen, RN Outcome: Progressing

## 2020-05-24 NOTE — Plan of Care (Signed)

## 2020-05-24 NOTE — Discharge Instructions (Signed)
  Diet: Resume home heart healthy regular diet.   Activity: No heavy lifting >20 pounds (children, pets, laundry, garbage) or strenuous activity until follow-up, but light activity and walking are encouraged. Do not drive or drink alcohol if taking narcotic pain medications.  Wound care: May shower with soapy water and pat dry (do not rub incisions), but no baths or submerging incision underwater until follow-up. (no swimming)   Medications: Resume all home medications. For mild to moderate pain: acetaminophen (Tylenol) or ibuprofen (if no kidney disease). Combining Tylenol with alcohol can substantially increase your risk of causing liver disease. Narcotic pain medications, if prescribed, can be used for severe pain, though may cause nausea, constipation, and drowsiness. Do not combine Tylenol and Norco within a 6 hour period as Norco contains Tylenol. If you do not need the narcotic pain medication, you do not need to fill the prescription.  Call office [phone]) at any time if any questions, worsening pain, fevers/chills, bleeding, drainage from incision site, or other concerns.  AMBULATORY SURGERY  DISCHARGE INSTRUCTIONS   1) The drugs that you were given will stay in your system until tomorrow so for the next 24 hours you should not:  A) Drive an automobile B) Make any legal decisions C) Drink any alcoholic beverage   2) You may resume regular meals tomorrow.  Today it is better to start with liquids and gradually work up to solid foods.  You may eat anything you prefer, but it is better to start with liquids, then soup and crackers, and gradually work up to solid foods.   3) Please notify your doctor immediately if you have any unusual bleeding, trouble breathing, redness and pain at the surgery site, drainage, fever, or pain not relieved by medication.    4) Additional Instructions:        Please contact your physician with any problems or Same Day Surgery at  [phone], Monday through Friday 6 am to 4 pm, or [place] at [facility] number at [phone].  Patient recommended to follow-up with cardiology as outpatient in a week. He'll call and make the appointment.

## 2020-05-24 NOTE — Progress Notes (Signed)
Patient discharged to home with family with all belongings and wheeled out of unit by transport.  Patient A+Ox4, VSS, Room Air.  Surgical sites approximated and open to air, c/d/i.  Medications and Discharge instructions reviewed.  All questions answered.  PIVx2 removed, no bleeding, intact.  Patient verbalized understanding of signs and symptoms of infection.  Patient agreed to follow up with all appointments as listed on AVS.  Patient satisfied with overall health at [facility].

## 2020-05-26 NOTE — Anesthesia Postprocedure Evaluation (Signed)
Anesthesia Post Note  Patient: Louis Gutierrez  Procedure(s) Performed: XI ROBOTIC ASSISTED INGUINAL HERNIA REPAIR WITH MESH (Left Inguinal)  Patient location during evaluation: PACU Anesthesia Type: General Level of consciousness: awake and alert and oriented Pain management: pain level controlled Vital Signs Assessment: post-procedure vital signs reviewed and stable Respiratory status: patient connected to nasal cannula oxygen Cardiovascular status: blood pressure returned to baseline Anesthetic complications: no Comments: Patient had some postop decreases in sats which resolved and EKG which showed LBBB.. Being followed.   No complications documented.   Last Vitals:  Vitals:   05/24/20 0921 05/24/20 1152  BP: (!) 152/82 139/78  Pulse: 89 83  Resp: 20 20  Temp: 36.6 C 36.8 C  SpO2: 92% 94%    Last Pain:  Vitals:   05/24/20 1152  TempSrc: Oral  PainSc: 0-No pain                 Singleton [place]ox

## 2020-05-31 DIAGNOSIS — H903 Sensorineural hearing loss, bilateral: Secondary | ICD-10-CM | POA: Diagnosis not present

## 2020-05-31 DIAGNOSIS — R42 Dizziness and giddiness: Secondary | ICD-10-CM | POA: Diagnosis not present

## 2020-05-31 DIAGNOSIS — H6123 Impacted cerumen, bilateral: Secondary | ICD-10-CM | POA: Diagnosis not present

## 2020-06-06 ENCOUNTER — Telehealth: Payer: Self-pay

## 2020-06-06 ENCOUNTER — Other Ambulatory Visit: Payer: Self-pay

## 2020-06-06 NOTE — Telephone Encounter (Signed)
Got in touch with patients wife at  # [phone]. All other numbers in chart did not work. Left message with, Bethena Roys for patient to call us back to schedule annual lung screening CT scan.

## 2020-06-06 NOTE — Patient Outreach (Signed)
[facility]) Care Management  06/06/2020  JAYLENE SCHROM 03/21/1950 [id number]   Telephone Assessment    Unsuccessful outreach attempt to patient.     Plan: RN CM will send unsuccessful outreach letter to patient. RN CM will make quarterly outreach attempt to patient within the month of Aug if no return call from patient.  Enzo Montgomery, RN,BSN,CCM [place] Management Telephonic Care Management Coordinator Direct Phone: [phone] Toll Free: [phone] Fax: [phone]

## 2020-06-11 ENCOUNTER — Ambulatory Visit: Payer: Self-pay

## 2020-07-04 DIAGNOSIS — D509 Iron deficiency anemia, unspecified: Secondary | ICD-10-CM | POA: Diagnosis not present

## 2020-07-15 ENCOUNTER — Other Ambulatory Visit: Payer: Self-pay | Admitting: Cardiovascular Disease

## 2020-07-30 ENCOUNTER — Telehealth: Payer: Self-pay | Admitting: *Deleted

## 2020-07-30 DIAGNOSIS — Z122 Encounter for screening for malignant neoplasm of respiratory organs: Secondary | ICD-10-CM

## 2020-07-30 DIAGNOSIS — Z87891 Personal history of nicotine dependence: Secondary | ICD-10-CM

## 2020-07-30 NOTE — Telephone Encounter (Signed)
Spoke to patient via telephone re: scheduling his annual low dose lung screening CT scan. Former smoker, quit in 2021, 43 pk yrs.Scan scheduled for 08/06/20 @ 8:30 am.

## 2020-08-06 ENCOUNTER — Other Ambulatory Visit: Payer: Self-pay

## 2020-08-06 ENCOUNTER — Ambulatory Visit
Admission: RE | Admit: 2020-08-06 | Discharge: 2020-08-06 | Disposition: A | Discharge status: Home / Self Care | Payer: Medicare HMO | Source: Ambulatory Visit | Attending: Acute Care | Admitting: Acute Care

## 2020-08-06 DIAGNOSIS — Z122 Encounter for screening for malignant neoplasm of respiratory organs: Secondary | ICD-10-CM | POA: Insufficient documentation

## 2020-08-06 DIAGNOSIS — Z87891 Personal history of nicotine dependence: Secondary | ICD-10-CM | POA: Insufficient documentation

## 2020-08-07 ENCOUNTER — Telehealth: Payer: Self-pay | Admitting: Acute Care

## 2020-08-07 DIAGNOSIS — Z87891 Personal history of nicotine dependence: Secondary | ICD-10-CM

## 2020-08-07 NOTE — Telephone Encounter (Signed)
Called [facility].  Lung cancer screening CT competed yesterday, 08/06/20. Radiologist wanted to make sure CT results were available and sent to [facility].  Lung cancer screening CT 08/06/20 Results-  IMPRESSION: 1. Lung-RADS 2, benign appearance or behavior. Continue annual screening with low-dose chest CT without contrast in 12 months. 2. Partially visualized hyperdense 1.9 cm posterior interpolar right renal cortical lesion, increased in size since 10/10/2015 CT, indeterminate for neoplasm. MRI (preferred) or CT abdomen without and with IV contrast suggested for further characterization. 3. Three-vessel coronary atherosclerosis. 4. Cholelithiasis. 5. Stable left adrenal adenoma. 6. Aortic Atherosclerosis (ICD10-I70.0) and Emphysema (ICD10-J43.9).   These results will be called to the ordering clinician or representative by the Radiologist Assistant, and communication documented in the PACS or [employer].

## 2020-08-07 NOTE — Telephone Encounter (Signed)
Noted  

## 2020-08-08 ENCOUNTER — Other Ambulatory Visit: Payer: Self-pay | Admitting: Internal Medicine

## 2020-08-08 DIAGNOSIS — N2889 Other specified disorders of kidney and ureter: Secondary | ICD-10-CM

## 2020-08-08 NOTE — Telephone Encounter (Signed)
CT results faxed to PCP via Epic. Order placed for 1 year f/u low dose chest ct.

## 2020-08-21 DIAGNOSIS — D509 Iron deficiency anemia, unspecified: Secondary | ICD-10-CM | POA: Diagnosis not present

## 2020-08-21 DIAGNOSIS — K219 Gastro-esophageal reflux disease without esophagitis: Secondary | ICD-10-CM | POA: Diagnosis not present

## 2020-08-21 DIAGNOSIS — Z86010 Personal history of colonic polyps: Secondary | ICD-10-CM | POA: Diagnosis not present

## 2020-08-21 NOTE — Progress Notes (Signed)
Please call patient and let them  know their  low dose Ct was read as a Lung RADS 2: nodules that are benign in appearance and behavior with a very low likelihood of becoming a clinically active cancer due to size or lack of growth. Recommendation per radiology is for a repeat LDCT in 12 months. .Please let them  know we will order and schedule their  annual screening scan for 07/2021. Please let them  know there was notation of CAD on their  scan.  Please remind the patient  that this is a non-gated exam therefore degree or severity of disease  cannot be determined. Please have them  follow up with their PCP regarding potential risk factor modification, dietary therapy or pharmacologic therapy if clinically indicated. Pt.  is  currently on statin therapy. Please place order for annual  screening scan for  07/2021 and fax results to PCP. Thanks so much.  Langley Gauss, this patient has an area on their right kidney that has increased in size since 2017. Radiology is recommending an MRI. I have called [facility], and I have left them a message , letting them know recommendations for follow up. They have returned my call, and are getting the follow up imaging. Thanks so much

## 2020-08-23 ENCOUNTER — Other Ambulatory Visit: Payer: Self-pay

## 2020-08-23 ENCOUNTER — Ambulatory Visit
Admission: RE | Admit: 2020-08-23 | Discharge: 2020-08-23 | Disposition: A | Discharge status: Home / Self Care | Payer: Medicare HMO | Source: Ambulatory Visit | Attending: Internal Medicine | Admitting: Internal Medicine

## 2020-08-23 DIAGNOSIS — N2889 Other specified disorders of kidney and ureter: Secondary | ICD-10-CM | POA: Insufficient documentation

## 2020-08-23 DIAGNOSIS — D3500 Benign neoplasm of unspecified adrenal gland: Secondary | ICD-10-CM | POA: Diagnosis not present

## 2020-08-23 DIAGNOSIS — N281 Cyst of kidney, acquired: Secondary | ICD-10-CM | POA: Diagnosis not present

## 2020-08-23 DIAGNOSIS — K8020 Calculus of gallbladder without cholecystitis without obstruction: Secondary | ICD-10-CM | POA: Diagnosis not present

## 2020-08-23 MED ORDER — GADOBUTROL 1 MMOL/ML IV SOLN
9.0000 mL | Freq: Once | INTRAVENOUS | Status: AC | PRN
  Administered 2020-08-23: 9 mL via INTRAVENOUS

## 2020-08-23 NOTE — Patient Outreach (Signed)
[facility]) Care Management  08/23/2020  Louis Gutierrez 17-Nov-1950 [id number]   Telephone Assessment Quarterly Call   Unsuccessful outreach attempt to patient.    Plan: RN CM will make outreach attempt within the month of Oct.   Louis Gutierrez [place] Management Telephonic Care Management Coordinator Direct Phone: [phone] Toll Free: [phone] Fax: [phone]

## 2020-08-23 NOTE — Patient Outreach (Signed)
[facility] [facility]) Care Management  08/23/2020  LILBERN SLACUM 1950/10/20 [id number]   Telephone Assessment Annual Assessment  Voicemail message received from patient returning RN CM call. Return call placed to patient. Discussed with patient that RN CM had bene trying to reach patient for past several months. Patient reports he has been busy. 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 He remains independent with ADLs/IADLs. Denies any falls. Appetite and wgt WNL.He states he has been doing fairly well. COPD has been managed. He states that he has a "possible growth on right kidney." Patient reports he went for MRI this morning. He is awaiting results. He denies any RN CM needs or concerns at this time.    Medications Reviewed Today     Reviewed by Hayden Pedro, RN (Registered Nurse) on 08/23/20 at 1323  Med List Status: <None>   Medication Order Taking? Sig Documenting Provider Last Dose Status Informant  acetaminophen (TYLENOL) 500 MG tablet [id number] No Take 1,000 mg by mouth every 8 (eight) hours as needed for mild pain.  [provider] 05/21/2020 Active Self  aspirin 81 MG chewable tablet [id number] No Chew 81 mg by mouth daily. [provider] Past Week Unknown time Active Self  clopidogrel (PLAVIX) 75 MG tablet [id number] No TAKE 1 TABLET(75 MG) BY MOUTH DAILY  Patient taking differently: Take 75 mg by mouth daily.   Croitoru, Mihai, MD 05/14/2020 Active   COMBIVENT RESPIMAT 20-100 MCG/ACT AERS respimat [id number] No Inhale 1 puff into the lungs every 6 (six) hours as needed for wheezing. [provider] 05/23/2020 Unknown time Active Self  ferrous sulfate 325 (65 FE) MG EC tablet [id number] No Take 1 tablet (325 mg total) by mouth in the morning and at bedtime.  Patient taking differently: Take 325 mg by mouth daily with breakfast.   Croitoru, Mihai, MD Past Week Unknown time Active   isosorbide mononitrate (IMDUR) 30 MG 24 hr tablet  [id number] No TAKE 1 TABLET(30 MG) BY MOUTH DAILY  Patient taking differently: Take 30 mg by mouth daily.   Croitoru, Mihai, MD 05/23/2020 Unknown time Active   nitroGLYCERIN (NITROSTAT) 0.4 MG SL tablet [id number] No PLACE 1 TABLET UNDER THE TONGUE EVERY 5 MINUTES AS NEEDED FOR CHEST PAIN  Patient taking differently: Place 0.4 mg under the tongue every 5 (five) minutes as needed for chest pain.   Croitoru, Mihai, MD Past Month Unknown time Active Self           Med Note (REGISTER, KAREN A   Wed May 23, 2020 11:53 AM) Pt has not needed this med  pantoprazole (PROTONIX) 40 MG tablet [id number] No Take 40 mg by mouth daily. [provider] 05/23/2020 Unknown time Active Self  PARoxetine (PAXIL) 40 MG tablet [id number] No Take 40 mg by mouth daily.  [provider] 05/23/2020 Unknown time Active Self           Med Note Fabio Bering   Tue Oct 07, 2016  6:39 PM)    pravastatin (PRAVACHOL) 40 MG tablet [id number]  TAKE 1 TABLET(40 MG) BY MOUTH DAILY Croitoru, Mihai, MD  Active   ramipril (ALTACE) 10 MG capsule [id number] No TAKE 1 CAPSULE(10 MG) BY MOUTH DAILY  Patient taking differently: Take 10 mg by mouth daily.   Croitoru, Mihai, MD 05/22/2020 Unknown time Active   traMADol (ULTRAM) 50 MG tablet [id number] No Take 1 tablet (50 mg total) by mouth every 6 (six) hours as needed. Versie Starks, PA-C  Not Taking Unknown time Active Self  traZODone (DESYREL) 100 MG tablet [id number] No Take 50 mg by mouth at bedtime as needed for sleep.  [provider] 05/22/2020 Unknown time Active Self           Med Note Ovidio Kin Oct 07, 2016  6:39 PM)              Depression screen [facility] 2/9 08/23/2020 07/29/2019  Decreased Interest 0 0  Down, Depressed, Hopeless 0 0  PHQ - 2 Score 0 0    SDOH Screenings   Alcohol Screen: Not on file  Depression (PHQ2-9): Low Risk    PHQ-2 Score: 0  Financial Resource Strain: Not on file  Food Insecurity: No Food Insecurity   Worried About  Charity fundraiser in the Last Year: Never true   Ran Out of Food in the Last Year: Never true  Housing: Not on file  Physical Activity: Not on file  Social Connections: Not on file  Stress: Not on file  Tobacco Use: Medium Risk   Smoking Tobacco Use: Former   Smokeless Tobacco Use: Never  Transportation Needs: No Transportation Needs   Lack of Transportation (Medical): No   Lack of Transportation (Non-Medical): No     Goals Addressed               This Visit's Progress     (THN)Track and Manage My Symptoms-COPD (pt-stated)        Timeframe:  Long-Range Goal Priority:  High Start Date: 08/23/2020                            Expected End Date: 01/18/2021                      Follow Up Date Nov 2022   - follow rescue plan if symptoms flare-up - keep follow-up appointments    Why is this important?   Tracking your symptoms and other information about your health helps your doctor plan your care.  Write down the symptoms, the time of day, what you were doing and what medicine you are taking.  You will soon learn how to manage your symptoms.     Notes:   08/23/20 Patient states that his breathing has been managed. Denies any SOB at present.        Plan: RN CM discussed with patient next outreach within the month of  Nov. Patient gave verbal consent and in agreement with RN CM follow up and timeframe. Patient aware that they may contact RN CM sooner for any issues or concerns. RN CM reviewed goals and plan of care with patient. Patient agrees to care plan and follow up. RN CM will send quarterly update to PCP.  Enzo Montgomery, RN,BSN,CCM [place] Management Telephonic Care Management Coordinator Direct Phone: [phone] Toll Free: [phone] Fax: [phone]

## 2020-08-29 ENCOUNTER — Ambulatory Visit: Payer: Self-pay

## 2020-08-29 ENCOUNTER — Ambulatory Visit (INDEPENDENT_AMBULATORY_CARE_PROVIDER_SITE_OTHER): Payer: Medicare HMO | Admitting: Urology

## 2020-08-29 ENCOUNTER — Other Ambulatory Visit: Payer: Self-pay

## 2020-08-29 ENCOUNTER — Encounter: Payer: Self-pay | Admitting: Urology

## 2020-08-29 VITALS — BP 146/90 | HR 90 | Ht 71.0 in | Wt 203.0 lb

## 2020-08-29 DIAGNOSIS — N2889 Other specified disorders of kidney and ureter: Secondary | ICD-10-CM

## 2020-08-29 DIAGNOSIS — E278 Other specified disorders of adrenal gland: Secondary | ICD-10-CM | POA: Diagnosis not present

## 2020-08-29 DIAGNOSIS — N529 Male erectile dysfunction, unspecified: Secondary | ICD-10-CM | POA: Diagnosis not present

## 2020-08-29 NOTE — Progress Notes (Signed)
08/29/20 11:27 AM   Louis Gutierrez Aug 06, 1950 [id number]  CC: Small renal mass, left adrenal mass, ED  HPI: I saw Mr. Buchman in urology clinic for the above issues.  He is an extremely comorbid 70 year old male who was last seen by urology by Dr. Tresa Moore in September 2017 for a 3.6 cm left adrenal mass that had been stable since 2015 without refractory hypertension or hypokalemia, as well as a small left 14 mm left upper pole solid renal mass since at least 2015.  He was rereferred back over for similar issues.  He denies any flank pain or gross hematuria  Most recent imaging was an MRI abdomen with and without contrast from August 2022 that shows a stable 1.3 cm left upper pole lesion with subtle enhancement.  This likely represents a small indolent neoplasm based on the stability over the last 5 to 7 years.  He has long-term ED, and cannot take PDE 5 inhibitor secondary to daily nitrates for CAD.  He has a known 3 cm left adrenal mass that was also stable on the most recent MRI, and I do not see that he is ever undergone endocrine work-up previously.   PMH: Past Medical History:  Diagnosis Date   Adrenal adenoma, left    Anxiety    Aortic atherosclerosis (HCC)    Asthma without status asthmaticus    Atherosclerotic cerebrovascular disease    Overview:  Small and facial weakness with minimal weakness    CAD (coronary artery disease)    a. 05/2010 Inf STEMI/PCI: RCA 148m(3.5x24 Promus DES), LCX small, 100, D1 50ost, D2 40ost, EF 45-50%; b. 03/2013 Cath ([facility]): 40% mLAD, 20% stenosis mRCA (site of the previous stent); LVEF 55%.   Cervical disc disease    a. s/p C3-4 fusion 2003.   [place]ervical myelopathy ([place]    a. 09/2016 Admit w/ left sided wkns->found to have progressive cervical disc dzs above and below prior fusioin site; b. 10/2016 s/p ant cervical diskectomy.   COPD (chronic obstructive pulmonary disease) ([place]    [place]ogenic [place] 05/[place]    s/p acute L MCA territory  infarct; b. 07/2013 s/p MDT Linq ILR-->No AFib to documented to date (09/2016).   Current use of long term anticoagulation    DAPT therapy (ASA + clopidogrel)   Depression    Diabetes mellitus without complication (HCC)    Essential (primary) hypertension    Gastritis    GERD (gastroesophageal reflux disease)    H/O insomnia    History of cerebrovascular accident (CVA) with residual deficit 2015   facial drooping on one side of face is only residual since stroke   IDA (iron deficiency anemia)    Inguinal hernia    Insomnia disorder    Ischemic cardiomyopathy    a. 05/2010 LV gram: EF 45-50% @ time of inf MI; b. 09/2016 Echo: EF 50-55%, inf HK, Gr1 DD, mild MR.   Mixed hyperlipidemia    Nephrolithiasis    Obesity    OSA (obstructive sleep apnea)    no cpap   Osteoarthritis    STEMI (ST elevation myocardial infarction) ([place] 06/07/2010   1 stent   Tobacco abuse    Urinary incontinence    Varicose veins    Vertigo     Surgical History: Past Surgical History:  Procedure Laterality Date   ANTERIOR CERVICAL DECOMP/DISCECTOMY FUSION N/A 10/22/2016   Procedure: ANTERIOR CERVICAL DECOMPRESSION/DISCECTOMY FUSION 2 LEVELS C3-4, C6-7;  Surgeon: YMeade Maw MD;  Location: [facility] ORS;  Service: Neurosurgery;  Laterality: N/A;   CARDIAC CATHETERIZATION  06/07/2010   50 % ostial narrowing in 1st diagonal from LAD, 2nd diagonal had 40% ostial narrowing; AV groove circumflex was diminutive and occluded proximally; RCA totally occluded in mid segment - stented with 3.5 x 15m Promus Element DES  - See Media tab   CARDIAC CATHETERIZATION  04/04/2013   40% mLAD, 20% mRCA at the site of the previous stent.  LVEF was 55%.   COLONOSCOPY WITH PROPOFOL N/A 02/26/2015   Procedure: COLONOSCOPY WITH PROPOFOL;  Surgeon: MJosefine Class MD;  [facility];  Service: Endoscopy;  Laterality: N/A;   COLONOSCOPY WITH PROPOFOL N/A 03/16/2020   Procedure: COLONOSCOPY WITH PROPOFOL;  Surgeon:  LLesly Rubenstein MD;  Location: [facility] ENDOSCOPY;  Service: Endoscopy;  Laterality: N/A;   ESOPHAGOGASTRODUODENOSCOPY  02/26/2015   Procedure: ESOPHAGOGASTRODUODENOSCOPY (EGD);  Surgeon: MJosefine Class MD;  [facility];  Service: Endoscopy;;   ESOPHAGOGASTRODUODENOSCOPY (EGD) WITH PROPOFOL N/A 03/16/2020   Procedure: ESOPHAGOGASTRODUODENOSCOPY (EGD) WITH PROPOFOL;  Surgeon: LLesly Rubenstein MD;  Location: [facility] ENDOSCOPY;  Service: Endoscopy;  Laterality: N/A;   EYE SURGERY     cataract replaced   LOOP RECORDER IMPLANT  08/16/13   Dr. CSallyanne Kuster  XI ROBOTIC ASSISTED INGUINAL HERNIA REPAIR WITH MESH Left 05/23/2020   Procedure: [place]IC [place]  Surgeon: CHerbert Pun MD;  Location: [facility] ORS;  Service: General;  Laterality: Left;    Family History: Family History  Problem Relation Age of Onset   Cancer Mother        oral   Stroke Maternal Grandmother    Diabetes Brother    Coronary artery disease Brother     Social History:  reports that he quit smoking about 9 months ago. His smoking use included cigarettes. He has a 53.00 pack-year smoking history. He has never used smokeless tobacco. He reports that he does not drink alcohol and does not use drugs.  Physical Exam: BP (!) 146/90   Pulse 90   Ht '5\' 11"'$  (1.803 m)   Wt 203 lb (92.1 kg)   BMI 28.31 kg/m    Constitutional:  Alert and oriented, No acute distress. Cardiovascular: No clubbing, cyanosis, or edema. Respiratory: Normal respiratory effort, no increased work of breathing. GI: Abdomen is soft, nontender, nondistended, no abdominal masses   Laboratory Data: Reviewed, see HPI  Pertinent Imaging: I have personally viewed and interpreted the most recent MRI from August 2022 that shows a stable 1.3 cm left enhancing upper pole mass consistent with an indolent renal neoplasm like papillary RCC.  This is unchanged from the prior MRI imaging in 2017.  Assessment &  Plan:   70year old male with a stable 1.3 cm left upper pole subtly enhancing renal mass consistent with an indolent renal neoplasm that has been stable over the last 5 to 7 years.  We discussed options including active surveillance, biopsy, partial nephrectomy, or ablation.  With his comorbidities and stability over the last 5 years, I think active surveillance is very reasonable, encouraged him to continue monitoring this lesion. Based on stability I think following up in 1 year with a renal ultrasound is sufficient, and we discussed the extremely low risk of developing metastatic disease or progression.  Regarding the adrenal mass this is likely benign based on the stability over the last 5 years as well, but with his hypertension I recommended referral to endocrinology for metabolic work-up.    In terms of  his ED, he is not a candidate for PDE 5 inhibitor secondary to his nitrate use, but could consider using a vacuum erection device in the future if he desires.  RTC 1 year with renal ultrasound prior Referral placed to endocrinology for metabolic evaluation of stable adrenal mass  Nickolas Madrid, MD 08/29/2020

## 2020-10-04 DIAGNOSIS — D509 Iron deficiency anemia, unspecified: Secondary | ICD-10-CM | POA: Diagnosis not present

## 2020-10-25 DIAGNOSIS — I10 Essential (primary) hypertension: Secondary | ICD-10-CM | POA: Diagnosis not present

## 2020-10-25 DIAGNOSIS — E119 Type 2 diabetes mellitus without complications: Secondary | ICD-10-CM | POA: Diagnosis not present

## 2020-10-25 DIAGNOSIS — Z125 Encounter for screening for malignant neoplasm of prostate: Secondary | ICD-10-CM | POA: Diagnosis not present

## 2020-11-01 DIAGNOSIS — Z23 Encounter for immunization: Secondary | ICD-10-CM | POA: Diagnosis not present

## 2020-11-01 DIAGNOSIS — I25119 Atherosclerotic heart disease of native coronary artery with unspecified angina pectoris: Secondary | ICD-10-CM | POA: Diagnosis not present

## 2020-11-01 DIAGNOSIS — J449 Chronic obstructive pulmonary disease, unspecified: Secondary | ICD-10-CM | POA: Diagnosis not present

## 2020-11-01 DIAGNOSIS — Z0000 Encounter for general adult medical examination without abnormal findings: Secondary | ICD-10-CM | POA: Diagnosis not present

## 2020-11-01 DIAGNOSIS — E119 Type 2 diabetes mellitus without complications: Secondary | ICD-10-CM | POA: Diagnosis not present

## 2020-11-01 DIAGNOSIS — F325 Major depressive disorder, single episode, in full remission: Secondary | ICD-10-CM | POA: Diagnosis not present

## 2020-11-01 DIAGNOSIS — I10 Essential (primary) hypertension: Secondary | ICD-10-CM | POA: Diagnosis not present

## 2020-11-01 DIAGNOSIS — Z1389 Encounter for screening for other disorder: Secondary | ICD-10-CM | POA: Diagnosis not present

## 2020-11-06 ENCOUNTER — Ambulatory Visit: Payer: Self-pay

## 2020-11-21 ENCOUNTER — Other Ambulatory Visit: Payer: Self-pay

## 2020-11-21 NOTE — Patient Outreach (Addendum)
[facility]) Care Management  11/21/2020  Louis Gutierrez [birth year] [id number]   Telephone Assessment Quarterly Call   Incoming call from patient returning RN CM call. Spoke briefly as patient currently out working. He continues to be active and work part time which he states helps him out a lot. He remains independent with ADLs/IADLs. No recent alls. Appetite good. Patient reports some intentional wgt loss as he is trying to lower A1C. He denies any RN CM needs or concerns at this time.       Medications Reviewed Today     Reviewed by Hayden Pedro, RN (Registered Nurse) on 11/21/20 at 3  Med List Status: <None>   Medication Order Taking? Sig Documenting Provider Last Dose Status Informant  acetaminophen (TYLENOL) 500 MG tablet [id number] No Take 1,000 mg by mouth every 8 (eight) hours as needed for mild pain.  [provider] Taking Active Self  aspirin 81 MG chewable tablet [id number] No Chew 81 mg by mouth daily. [provider] Taking Active Self  clopidogrel (PLAVIX) 75 MG tablet [id number] No TAKE 1 TABLET(75 MG) BY MOUTH DAILY Croitoru, Mihai, MD Taking Active   COMBIVENT RESPIMAT 20-100 MCG/ACT AERS respimat [id number] No Inhale 1 puff into the lungs every 6 (six) hours as needed for wheezing. [provider] Taking Active Self  ferrous sulfate 325 (65 FE) MG EC tablet [id number] No Take 1 tablet (325 mg total) by mouth in the morning and at bedtime. Croitoru, Mihai, MD Taking Active   isosorbide mononitrate (IMDUR) 30 MG 24 hr tablet [id number] No TAKE 1 TABLET(30 MG) BY MOUTH DAILY Croitoru, Mihai, MD Taking Active   nitroGLYCERIN (NITROSTAT) 0.4 MG SL tablet [id number] No PLACE 1 TABLET UNDER THE TONGUE EVERY 5 MINUTES AS NEEDED FOR CHEST PAIN Croitoru, Mihai, MD Taking Active            Med Note DESHON, HSIAO   Wed Aug 29, 2020 11:17 AM)    pantoprazole (PROTONIX) 40 MG tablet [id number] No Take 40 mg by mouth  daily. [provider] Taking Active Self  PARoxetine (PAXIL) 40 MG tablet [id number] No Take 40 mg by mouth daily.  [provider] Taking Active Self           Med Note Jimmye Norman, Scharlene Gloss   Tue Oct 07, 2016  6:39 PM)    pravastatin (PRAVACHOL) 40 MG tablet [id number] No TAKE 1 TABLET(40 MG) BY MOUTH DAILY Croitoru, Mihai, MD Taking Active   ramipril (ALTACE) 10 MG capsule [id number] No TAKE 1 CAPSULE(10 MG) BY MOUTH DAILY Croitoru, Mihai, MD Taking Active   traMADol (ULTRAM) 50 MG tablet [id number] No Take 1 tablet (50 mg total) by mouth every 6 (six) hours as needed. Versie Starks, PA-C Taking Active Self  traZODone (DESYREL) 100 MG tablet [id number] No Take 50 mg by mouth at bedtime as needed for sleep.  [provider] Taking Active Self           Med Note Fabio Bering   Tue Oct 07, 2016  6:39 PM)              Care Plan : RN Care Manager POC  Updates made by Hayden Pedro, RN since 11/21/2020 12:00 AM     Problem: Chronic Disease Mgmt of Chronic Conditions(COPD,DM)   Priority: High     Long-Range Goal: Development of POC for Mgmt of Chronic Conditions   Start Date: 11/21/2020  Expected End Date: 11/21/2021  Priority:  High  Note:    Current Barriers:  Chronic Disease Management support and education needs related to COPD and DMII  RNCM Clinical Goal(s):  Patient will verbalize understanding of plan for management of COPD and DMII verbalize basic understanding of  COPD and DMII disease process and self health management plan for chronic conditions take all medications exactly as prescribed and will call provider for medication related questions demonstrate Ongoing adherence to prescribed treatment plan for COPD and DMII as evidenced by lowering of A1C level and no minimal COPD exacerbations  through collaboration with RN Care manager, provider, and care team.   Interventions: POC sent to PCP upon initial assessment, quarterly and  with any changes in patient's conditions Inter-disciplinary care team collaboration (see longitudinal plan of care) Evaluation of current treatment plan related to  self management and patient's adherence to plan as established by provider   Diabetes Interventions:  Patient reports an intentional wgt loss of about 12 lbs within the past 6 months or more as he verbalizes wgt loss with positively affect his overall health in a good way. Assessed patient's understanding of A1c goal: <7% Provided education to patient about basic DM disease process; Reviewed medications with patient and discussed importance of medication adherence; Praised patient for lowering of A1C level based on most recent results (previous A1C 6.5-April 2022) Lab Results  Component Value Date   HGBA1C 6.4 (H) 12/22/2019   COPD Interventions:  Patient reports he has noticed a slight change in his breathing pattern since weather/season has changed. He is using inhalers as ordered with good relief and avoiding triggers. Advised patient to track and manage COPD triggers;  Advised patient to self assesses COPD action plan zone and make appointment with provider if in the yellow zone for 48 hours without improvement; Assessed for vaccination/immunization status -patient received flu shot at PCP appt-11/01/20  Patient Goals/Self-Care Activities: Patient will self administer medications as prescribed Patient will attend all scheduled provider appointments Patient will call provider office for new concerns or questions Patient will continue to monitor cbgs in the home and adhere to diabetic diet Patient will have no exacerbation of sxs and unplanned hospital admission over the next 90 days    Follow Up Plan:  Telephone follow up appointment with care management team member scheduled for:  quarterly-in the month of Feb The patient has been provided with contact information for the care management team and has been advised to call  with any health related questions or concerns.         Plan: RN CM discussed with patient next outreach within the month of Feb. Patient agrees to care plan and follow up. RN CM will send quarterly update to PCP.  Enzo Montgomery, RN,BSN,CCM [place] Management Telephonic Care Management Coordinator Direct Phone: [phone] Toll Free: [phone] Fax: [phone]

## 2020-11-21 NOTE — Patient Outreach (Signed)
[name]) Care Management  11/21/2020  Louis Gutierrez 1950/06/29 [id number]   Telephone Assessment     Unsuccessful outreach attempt to patient. RN CM left HIPAA compliant voicemail message along with contact info.     Plan: RN CM discussed with patient next outreach within the month of  Jan if no return call from patient.  Louis Montgomery, RN,BSN,CCM [place] Management Telephonic Care Management Coordinator Direct Phone: [phone] Toll Free: [phone] Fax: [phone]

## 2020-12-21 ENCOUNTER — Other Ambulatory Visit: Payer: Self-pay

## 2020-12-21 ENCOUNTER — Ambulatory Visit (INDEPENDENT_AMBULATORY_CARE_PROVIDER_SITE_OTHER): Payer: Medicare HMO | Admitting: Cardiovascular Disease

## 2020-12-21 ENCOUNTER — Encounter: Payer: Self-pay | Admitting: Cardiovascular Disease

## 2020-12-21 VITALS — BP 150/90 | HR 91 | Ht 71.0 in | Wt 230.4 lb

## 2020-12-21 DIAGNOSIS — Z87891 Personal history of nicotine dependence: Secondary | ICD-10-CM | POA: Diagnosis not present

## 2020-12-21 DIAGNOSIS — I10 Essential (primary) hypertension: Secondary | ICD-10-CM

## 2020-12-21 DIAGNOSIS — J449 Chronic obstructive pulmonary disease, unspecified: Secondary | ICD-10-CM

## 2020-12-21 DIAGNOSIS — E785 Hyperlipidemia, unspecified: Secondary | ICD-10-CM | POA: Diagnosis not present

## 2020-12-21 DIAGNOSIS — Z8673 Personal history of transient ischemic attack (TIA), and cerebral infarction without residual deficits: Secondary | ICD-10-CM | POA: Diagnosis not present

## 2020-12-21 DIAGNOSIS — I2510 Atherosclerotic heart disease of native coronary artery without angina pectoris: Secondary | ICD-10-CM

## 2020-12-21 DIAGNOSIS — R7303 Prediabetes: Secondary | ICD-10-CM | POA: Diagnosis not present

## 2020-12-21 NOTE — Progress Notes (Signed)
Cardiology Office Note    Date:  12/21/2020   ID:  Louis Gutierrez, DOB [birth year], MRN [id number]  PCP:  Kirk Ruths, MD  Cardiologist:   Sanda Klein, MD   No chief complaint on file.   History of Present Illness:  Louis Gutierrez is a 70 y.o. male with coronary artery disease and a history of cryptogenic stroke.    He has had a uneventful year from a cardiovascular point of view.  He underwent surgery for hernia repair in May and had a lot of problems with dyspnea and hypoxemia postop.  BNP was normal at 92 (had been as high as 342 in 2014), problems were probably related to smoking-related severe COPD.  ECG (performed before his surgery and repeated later that day) showed normal sinus rhythm and left bundle branch block, which is new when compared to 03/12/2019.    He continues to work, driving cars for use Teacher, early years/pre.  He has chronic shortness of breath, chronic cough and wheezing.  He denies palpitations, dizziness or syncope.  He did have 1 fall when he looked and reached up to the kitchen cabinet and fell backwards.  He did not lose consciousness but had vertigo.  He does not have orthostatic hypotension symptoms.  He denies any problems with angina pectoris.  It's been many years since he last took nitroglycerin.  He does not have lower extremity edema or claudication.  He has gained back some weight and is in obese range again.   He had a screening CT lung cancer which showed coronary artery calcifications (not surprising) and gallstones (asymptomatic).  Since this cryptogenic stroke, he has an implantable loop recorder that has reached end of service and is left in place.  During 3 years of monitoring that did not show atrial fibrillation.  He has not had any new neurological events.  His loop recorder has reached end of service.  He presented with ST segment elevation myocardial infarction in May of 2012. At that time had an inferior wall infarction, manifesting as  unstable angina pectoris and eventually a syncopal episode. The right coronary artery was totally occluded and revascularization was difficult but eventually he received a 3.5 x 24 mm Promus drug-eluting stent to the mid RCA. Also noted was 50% ostial stenosis of the first diagonal artery and 40% ostial stenosis of the second diagonal artery branches of the LAD, as well as total occlusion of a small left circumflex system. There was evidence of inferolateral hypokinesis and ejection fraction of 45-50% at that time. He presented with chest pain to [facility] in 2015 when he had a repeat coronary angiogram that was reportedly normal. I don't have that report. In May 2015 he presented with right hemiparesis and expressive aphasia with an infarction in the territory of the left middle cerebral artery. An etiology was not identified. He underwent loop recorder implantation in August 2015 and in 3 years the device has not recorded any arrhythmia. His most recent echo performed in March 2015 showed left ventricular ejection fraction of 50-55 percent with abnormal relaxation and no regional wall motion abnormalities. Both atria were described as normal in size and there were no serious valvular abnormalities.  Identical findings recorded in September 2018 echo, again EF 50-55% and the left atrium was described as normal in size.    Past Medical History:  Diagnosis Date   Adrenal adenoma, left    Anxiety    Aortic atherosclerosis ([place])  Asthma without status asthmaticus    Atherosclerotic cerebrovascular disease    Overview:  Small and facial weakness with minimal weakness    CAD (coronary artery disease)    a. 05/2010 Inf STEMI/PCI: RCA 150m (3.5x24 Promus DES), LCX small, 100, D1 50ost, D2 40ost, EF 45-50%; b. 03/2013 Cath ([facility]): 40% mLAD, 20% stenosis mRCA (site of the previous stent); LVEF 55%.   Cervical disc disease    a. s/p C3-4 fusion 2003.   Cervical myelopathy ([place])    a.  09/2016 Admit w/ left sided wkns->found to have progressive cervical disc dzs above and below prior fusioin site; b. 10/2016 s/p ant cervical diskectomy.   COPD (chronic obstructive pulmonary disease) ([place])    Cryptogenic stroke ([place]) 05/2013    s/p acute L MCA territory infarct; b. 07/2013 s/p MDT Linq ILR-->No AFib to documented to date (09/2016).   Current use of long term anticoagulation    DAPT therapy (ASA + clopidogrel)   Depression    Diabetes mellitus without complication (HCC)    Essential (primary) hypertension    Gastritis    GERD (gastroesophageal reflux disease)    H/O insomnia    History of cerebrovascular accident (CVA) with residual deficit 2015   facial drooping on one side of face is only residual since stroke   IDA (iron deficiency anemia)    Inguinal hernia    Insomnia disorder    Ischemic cardiomyopathy    a. 05/2010 LV gram: EF 45-50% @ time of inf MI; b. 09/2016 Echo: EF 50-55%, inf HK, Gr1 DD, mild MR.   Mixed hyperlipidemia    Nephrolithiasis    Obesity    OSA (obstructive sleep apnea)    no cpap   Osteoarthritis    STEMI (ST elevation myocardial infarction) ([place]) 06/07/2010   1 stent   Tobacco abuse    Urinary incontinence    Varicose veins    Vertigo     Past Surgical History:  Procedure Laterality Date   ANTERIOR CERVICAL DECOMP/DISCECTOMY FUSION N/A 10/22/2016   Procedure: ANTERIOR CERVICAL DECOMPRESSION/DISCECTOMY FUSION 2 LEVELS C3-4, C6-7;  Surgeon: Meade Maw, MD;  Location: [facility] ORS;  Service: Neurosurgery;  Laterality: N/A;   CARDIAC CATHETERIZATION  06/07/2010   50 % ostial narrowing in 1st diagonal from LAD, 2nd diagonal had 40% ostial narrowing; AV groove circumflex was diminutive and occluded proximally; RCA totally occluded in mid segment - stented with 3.5 x 87mm Promus Element DES  - See Media tab   CARDIAC CATHETERIZATION  04/04/2013   40% mLAD, 20% mRCA at the site of the previous stent.  LVEF was 55%.   COLONOSCOPY WITH PROPOFOL  N/A 02/26/2015   Procedure: COLONOSCOPY WITH PROPOFOL;  Surgeon: Josefine Class, MD;  Location: [facility] ENDOSCOPY;  Service: Endoscopy;  Laterality: N/A;   COLONOSCOPY WITH PROPOFOL N/A 03/16/2020   Procedure: COLONOSCOPY WITH PROPOFOL;  Surgeon: Lesly Rubenstein, MD;  Location: [facility] ENDOSCOPY;  Service: Endoscopy;  Laterality: N/A;   ESOPHAGOGASTRODUODENOSCOPY  02/26/2015   Procedure: ESOPHAGOGASTRODUODENOSCOPY (EGD);  Surgeon: Josefine Class, MD;  Location[facility] END[facility];  Service: Endoscopy;;   ESOPHAGOGASTRODUODENOSCOPY (EGD) WITH PROPOFOL N/A 03/16/2020   Procedure: ESOPHAGOGASTRODUODENOSCOPY (EGD) WITH PROPOFOL;  Surgeon: Lesly Rubenstein, MD;  Location: [facility] ENDOSCOPY;  Service: Endoscopy;  Laterality: N/A;   EYE SURGERY     cataract replaced   LOOP RECORDER IMPLANT  08/16/13   Dr. Sallyanne Kuster   XI ROBOTIC ASSISTED INGUINAL HERNIA REPAIR WITH MESH Left 05/23/2020   Procedure: XI ROBOTIC ASSISTED  INGUINAL HERNIA REPAIR WITH MESH;  Surgeon: Herbert Pun, MD;  Location: [facility] ORS;  Service: General;  Laterality: Left;    Current Medications: Outpatient Medications Prior to Visit  Medication Sig Dispense Refill   acetaminophen (TYLENOL) 500 MG tablet Take 1,000 mg by mouth every 8 (eight) hours as needed for mild pain.      aspirin 81 MG chewable tablet Chew 81 mg by mouth daily.     clopidogrel (PLAVIX) 75 MG tablet TAKE 1 TABLET(75 MG) BY MOUTH DAILY 90 tablet 2   COMBIVENT RESPIMAT 20-100 MCG/ACT AERS respimat Inhale 1 puff into the lungs every 6 (six) hours as needed for wheezing.     ferrous sulfate 325 (65 FE) MG EC tablet Take 1 tablet (325 mg total) by mouth in the morning and at bedtime. 60 tablet 11   pantoprazole (PROTONIX) 40 MG tablet Take 40 mg by mouth daily.     PARoxetine (PAXIL) 40 MG tablet Take 40 mg by mouth daily.      pravastatin (PRAVACHOL) 40 MG tablet TAKE 1 TABLET(40 MG) BY MOUTH DAILY 90 tablet 3   ramipril (ALTACE) 10 MG capsule TAKE 1 CAPSULE(10  MG) BY MOUTH DAILY 90 capsule 2   traMADol (ULTRAM) 50 MG tablet Take 1 tablet (50 mg total) by mouth every 6 (six) hours as needed. (Patient taking differently: Take 7.5 mg by mouth every 6 (six) hours as needed. TOTAL 7.5MG ) 15 tablet 0   traZODone (DESYREL) 100 MG tablet Take 50 mg by mouth at bedtime as needed for sleep.      isosorbide mononitrate (IMDUR) 30 MG 24 hr tablet TAKE 1 TABLET(30 MG) BY MOUTH DAILY 90 tablet 2   nitroGLYCERIN (NITROSTAT) 0.4 MG SL tablet PLACE 1 TABLET UNDER THE TONGUE EVERY 5 MINUTES AS NEEDED FOR CHEST PAIN 25 tablet 2   No facility-administered medications prior to visit.     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  Tobacco Use   Smoking status: Former    Packs/day: 1.00    Years: 53.00    Pack years: 53.00    Types: Cigarettes    Quit date: 11/14/2019    Years since quitting: 1.1   Smokeless tobacco: Never  Vaping Use   Vaping Use: Some days   Start date: 11/14/2019   Substances: Nicotine  Substance and Sexual Activity   Alcohol use: No   Drug use: No   Sexual activity: Not on file  Other Topics Concern   Not on file  Social History Narrative   Lives in [place] with wife and two daughters. Feels safe in his home.   Walks dog 30 mins 2x/day w/o difficulties.      Continues to work part time.   Social Determinants of Health   Financial Resource Strain: Not on file  Food Insecurity: No Food Insecurity   Worried About Charity fundraiser in the Last Year: Never true   Ran Out of Food in the Last Year: Never true  Transportation Needs: No Transportation Needs   Lack of Transportation (Medical): No   Lack of Transportation  (Non-Medical): No  Physical  Activity: Not on file  Stress: Not on file  Social Connections: Not on file     Family History:  The patient's family history includes Cancer in his mother; Coronary artery disease in his brother; Diabetes in his brother; Stroke in his maternal grandmother.   ROS:   Please see the history of present illness.    ROS All other systems are reviewed and are negative.   PHYSICAL EXAM:   VS:  BP (!) 150/90   Pulse 91   Ht 5\' 11"  (1.803 m)   Wt 230 lb 6.4 oz (104.5 kg)   SpO2 94%   BMI 32.13 kg/m      General: Alert, oriented x3, no distress, mildly obese Head: no evidence of trauma, PERRL, EOMI, no exophtalmos or lid lag, no myxedema, no xanthelasma; normal ears, nose and oropharynx Neck: normal jugular venous pulsations and no hepatojugular reflux; brisk carotid pulses without delay and no carotid bruits Chest: clear to auscultation, no signs of consolidation by percussion or palpation, normal fremitus, symmetrical and full respiratory excursions Cardiovascular: normal position and quality of the apical impulse, regular rhythm, normal first and second heart sounds, no murmurs, rubs or gallops Abdomen: no tenderness or distention, no masses by palpation, no abnormal pulsatility or arterial bruits, normal bowel sounds, no hepatosplenomegaly Extremities: no clubbing, cyanosis or edema; 2+ radial, ulnar and brachial pulses bilaterally; 2+ right femoral, posterior tibial and dorsalis pedis pulses; 2+ left femoral, posterior tibial and dorsalis pedis pulses; no subclavian or femoral bruits Neurological: grossly nonfocal Psych: Normal mood and affect     Wt Readings from Last 3 Encounters:  12/21/20 230 lb 6.4 oz (104.5 kg)  08/29/20 203 lb (92.1 kg)  08/06/20 207 lb (93.9 kg)      Studies/Labs Reviewed:   EKG:  EKG is not ordered today.  The ECG from May 23, 2020 shows normal sinus rhythm and left bundle branch block.  Recent Labs: 07/08/2018 Total  cholesterol 104, triglycerides 78, HDL 34, LDL 54 Hemoglobin A1c 6.6%  08/26/2018 Potassium 4.4, creatinine 1.0  August 31, 2017 Creatinine 0.9, BUN 13, potassium 4.4, aldosterone/renin ratio 3.1, cortisol 7.3 (ordered for evaluation of an adrenal adenoma, incidentally discovered during CT of the chest for lung cancer screening)  July 01, 2017  hemoglobin A1c 6.5%  Total cholesterol 109, triglycerides 78, HDL 34, LDL 59  Lipid Panel    Component Value Date/Time   CHOL 115 12/22/2019 0908   CHOL 110 05/22/2013 0526   TRIG 66 12/22/2019 0908   TRIG 162 05/22/2013 0526   HDL 46 12/22/2019 0908   HDL 17 (L) 05/22/2013 0526   CHOLHDL 2.5 12/22/2019 0908   CHOLHDL 3.1 10/08/2016 0507   VLDL 17 10/08/2016 0507   VLDL 32 05/22/2013 0526   LDLCALC 55 12/22/2019 0908   LDLCALC 61 05/22/2013 0526   10/25/2020 Total cholesterol 129, triglycerides 79, HDL 36, LDL 77 Hemoglobin A1c 6.2% creatinine 1.1, potassium 4.6, normal liver function test except for borderline elevated alkaline phosphatase at 140 (chronic)  10/04/2020 hemoglobin 14.7  ASSESSMENT:    1. Coronary artery disease involving native coronary artery of native heart without angina pectoris   2. History of arterial ischemic stroke   3. Dyslipidemia (high LDL; low HDL)   4. Prediabetes   5. Essential hypertension   6. History of smoking   7. Chronic obstructive pulmonary disease, unspecified COPD type ([place])      PLAN:  In order of problems listed above:  CAD: He does not  have angina pectoris.  I think we can try to discontinue his long-acting nitrate.  Continue aspirin, statin, beta-blocker.  He is also on clopidogrel due to his history of stroke. Hx cryptogenic ischemic stroke: No recurrence in 7 years.  On dual antiplatelet therapy.  3 years of implantable loop recorder monitor did not show evidence of atrial fibrillation. HLP/prediabetes: He has gained back weight and his lipid profile has deteriorated.  A couple  of years ago when he was not obese, his HDL was greater than 40 and his LDL was only 42.  Rather than change his medicines, I think he needs to focus on weight loss since this will have many other metabolic benefits.  If he is not successful and his lipid profile remains out of target range next year, we will switch from pravastatin to rosuvastatin.  Glucose level remains in prediabetes range.  This will also improve if he loses the weight. HTN: Initially high when he checked in today, but normal at 120/65 when we rechecked.  Blood pressure was 103/71 at his primary care provider visit in October.  No changes to his BP medications. COPD/history smoking: Quitting smoking probably contributed to his weight gain, but it is still a net beneficial intervention.    Medication Adjustments/Labs and Tests Ordered: Current medicines are reviewed at length with the patient today.  Concerns regarding medicines are outlined above.  Medication changes, Labs and Tests ordered today are listed in the Patient Instructions below. Patient Instructions  Medication Instructions:  STOP the Nitroglycerin STOP the Isosorbide (Imdur)  *If you need a refill on your cardiac medications before your next appointment, please call your pharmacy*   Lab Work: None ordered If you have labs (blood work) drawn today and your tests are completely normal, you will receive your results only by: [place] (if you have MyChart) OR A paper copy in the mail If you have any lab test that is abnormal or we need to change your treatment, we will call you to review the results.   Testing/Procedures: None ordered   Follow-Up: At [facility], you and your health needs are our priority.  As part of our continuing mission to provide you with exceptional heart care, we have created designated Provider Care Teams.  These Care Teams include your primary Cardiologist (physician) and Advanced Practice Providers (APPs -  Physician  Assistants and Nurse Practitioners) who all work together to provide you with the care you need, when you need it.  We recommend signing up for the patient portal called "MyChart".  Sign up information is provided on this After Visit Summary.  MyChart is used to connect with patients for Virtual Visits (Telemedicine).  Patients are able to view lab/test results, encounter notes, upcoming appointments, etc.  Non-urgent messages can be sent to your provider as well.   To learn more about what you can do with MyChart, go to [email or web address].    Your next appointment:   12 month(s)  The format for your next appointment:   In Person  Provider:   Sanda Klein, MD     Waylan Boga, MD  12/21/2020 11:35 AM    [address], [place], [place]  [address] Phone: [phone]; Fax: [phone]

## 2020-12-21 NOTE — Patient Instructions (Signed)
Medication Instructions:  STOP the Nitroglycerin STOP the Isosorbide (Imdur)  *If you need a refill on your cardiac medications before your next appointment, please call your pharmacy*   Lab Work: None ordered If you have labs (blood work) drawn today and your tests are completely normal, you will receive your results only by: [place] (if you have MyChart) OR A paper copy in the mail If you have any lab test that is abnormal or we need to change your treatment, we will call you to review the results.   Testing/Procedures: None ordered   Follow-Up: At [facility], you and your health needs are our priority.  As part of our continuing mission to provide you with exceptional heart care, we have created designated Provider Care Teams.  These Care Teams include your primary Cardiologist (physician) and Advanced Practice Providers (APPs -  Physician Assistants and Nurse Practitioners) who all work together to provide you with the care you need, when you need it.  We recommend signing up for the patient portal called "MyChart".  Sign up information is provided on this After Visit Summary.  MyChart is used to connect with patients for Virtual Visits (Telemedicine).  Patients are able to view lab/test results, encounter notes, upcoming appointments, etc.  Non-urgent messages can be sent to your provider as well.   To learn more about what you can do with MyChart, go to [email or web address].    Your next appointment:   12 month(s)  The format for your next appointment:   In Person  Provider:   Sanda Klein, MD     Orville Govern

## 2021-01-22 DIAGNOSIS — Z86010 Personal history of colonic polyps: Secondary | ICD-10-CM | POA: Diagnosis not present

## 2021-01-22 DIAGNOSIS — Z7902 Long term (current) use of antithrombotics/antiplatelets: Secondary | ICD-10-CM | POA: Diagnosis not present

## 2021-01-22 DIAGNOSIS — D509 Iron deficiency anemia, unspecified: Secondary | ICD-10-CM | POA: Diagnosis not present

## 2021-01-24 ENCOUNTER — Telehealth: Payer: Self-pay | Admitting: *Deleted

## 2021-01-24 NOTE — Telephone Encounter (Signed)
° °  Pre-operative Risk Assessment    Patient Name: Louis Gutierrez  DOB: 12-06-1950 MRN: [id number]      Request for Surgical Clearance    Procedure:   Colonoscopy  Date of Surgery:  Clearance 02/22/21                                 Surgeon:  Dr Andrey Farmer Surgeon's Group or Practice Name:  [facility] /Gastroenterology Phone number:   [phone] Fax number:  [phone]   Type of Clearance Requested:   - Medical  - Pharmacy:  Hold Clopidogrel (Plavix) 5 days   Type of Anesthesia:  Not Indicated   Additional requests/questions:  Please advise surgeon/provider what medications should be held.  SignedRaiford Simmonds   01/24/2021, 9:22 AM

## 2021-01-24 NOTE — Telephone Encounter (Signed)
° °  Name: Louis Gutierrez  DOB: [birth year]  MRN: [id number]   Primary Cardiologist: Sanda Klein, MD  Chart reviewed as part of pre-operative protocol coverage. Patient was contacted 01/24/2021 in reference to pre-operative risk assessment for pending surgery as outlined below.  Louis Gutierrez was last seen on 12/21/20 by Dr. Sallyanne Kuster.  Since that day, Louis Gutierrez has done well from a cardiac standpoint. He is able to walk to his mail box and back which does involve an uphill climb. He climbs his stairs at home with occassionally needing to rest to catch his breath. This is not new. He can complete 4 METs without anginal complaints.   Therefore, based on ACC/AHA guidelines, the patient would be at acceptable risk for the planned procedure without further cardiovascular testing.   The patient was advised that if he develops new symptoms prior to surgery to contact our office to arrange for a follow-up visit, and he verbalized understanding.  Per previous recommendations and given the lack of interval change in cardiac history, he can hold plavix for 5 days prior to his procedure and restart as soon as indicated by his GI physician.   I will route this recommendation to the requesting party via Epic fax function and remove from pre-op pool. Please call with questions.  Elgie Collard, PA-C 01/24/2021, 1:34 PM

## 2021-01-25 DIAGNOSIS — D509 Iron deficiency anemia, unspecified: Secondary | ICD-10-CM | POA: Diagnosis not present

## 2021-01-25 DIAGNOSIS — Z86010 Personal history of colonic polyps: Secondary | ICD-10-CM | POA: Diagnosis not present

## 2021-01-25 DIAGNOSIS — Z7902 Long term (current) use of antithrombotics/antiplatelets: Secondary | ICD-10-CM | POA: Diagnosis not present

## 2021-02-19 ENCOUNTER — Telehealth: Payer: Self-pay | Admitting: Cardiovascular Disease

## 2021-02-19 ENCOUNTER — Telehealth: Payer: Self-pay

## 2021-02-19 NOTE — Telephone Encounter (Signed)
Louis Gutierrez from [facility] states they have not received the clearance from phone note 01/24/2021. She requests it be faxed again to: [phone]

## 2021-02-19 NOTE — Telephone Encounter (Signed)
Resent surgical clearance to [phone].

## 2021-02-19 NOTE — Telephone Encounter (Signed)
° °  Pre-operative Risk Assessment    Patient Name: LATAVIOUS BITTER  DOB: 1951/01/04 MRN: [id number] HEARTCARE STAFF-IMPORTANT INSTRUCTIONS 1 Red and Blue Text will auto delete once note is signed or closed. 2 Press F2 to navigate through template.   3 On drop down lists, L click to select >> R click to activate next field 4 Reason for Visit format is IMPORTANT!!  See Directions on No. 2 below. 5 Please review chart to determine if there is already a clearance note open for this procedure!!  DO NOT duplicate if a note already exists!!    :1}      Request for Surgical Clearance    Procedure:   Colonoscopy  Date of Surgery:  Clearance 02/22/21                                 Surgeon:  Tressie Stalker Surgeon's Group or Practice Name:  [facility] Phone number:  [phone] Fax number:  [phone]   Type of Clearance Requested:   - Medical  - Pharmacy:  Hold Clopidogrel (Plavix)     Type of Anesthesia:  Not Indicated   Additional requests/questions:  Please advise surgeon/provider what medications should be held. Please fax a copy of clearance to the surgeon's office.  Evalee Mutton   02/19/2021, 11:16 AM

## 2021-02-19 NOTE — Telephone Encounter (Signed)
Preoperative team, please resend preoperative clearance to requesting office.  Fax #[phone].  Thank you.  Jossie Ng. Yeiren Whitecotton NP-C    02/19/2021, 9:00 AM [address] Office [phone] Fax [phone]

## 2021-02-21 ENCOUNTER — Other Ambulatory Visit: Payer: Self-pay

## 2021-02-21 NOTE — Patient Outreach (Signed)
[facility]) Care Management  02/21/2021  Louis Gutierrez [birth year] [id number]   Telephone Assessment Quarterly Call  Successful outreach call to patient. Spoke briefly as patient only had a few minutes to talk.He voices that things re going well and he is doing good. No new issues or concerns at present. He does not go back to see PCP until April. He has upcoming colonoscopy scheduled for next month. Appetite remains good and wgt stable. He remains independent with ADLs/IADLs. He also continues to work. His breathing has been controlled. Denies any RN CM needs or concerns at this time.   Medications Reviewed Today     Reviewed by Hayden Pedro, RN (Registered Nurse) on 02/21/21 at [phone]  Med List Status: <None>   Medication Order Taking? Sig Documenting Provider Last Dose Status Informant  acetaminophen (TYLENOL) 500 MG tablet [id number] No Take 1,000 mg by mouth every 8 (eight) hours as needed for mild pain.  [provider] Taking Active Self  aspirin 81 MG chewable tablet [id number] No Chew 81 mg by mouth daily. [provider] Taking Active Self  clopidogrel (PLAVIX) 75 MG tablet [id number] No TAKE 1 TABLET(75 MG) BY MOUTH DAILY Croitoru, Mihai, MD Taking Active   COMBIVENT RESPIMAT 20-100 MCG/ACT AERS respimat [id number] No Inhale 1 puff into the lungs every 6 (six) hours as needed for wheezing. [provider] Taking Active Self  ferrous sulfate 325 (65 FE) MG EC tablet [id number] No Take 1 tablet (325 mg total) by mouth in the morning and at bedtime. Croitoru, Mihai, MD Taking Active   pantoprazole (PROTONIX) 40 MG tablet [id number] No Take 40 mg by mouth daily. [provider] Taking Active Self  PARoxetine (PAXIL) 40 MG tablet [id number] No Take 40 mg by mouth daily.  [provider] Taking Active Self           Med Note Jimmye Norman, Scharlene Gloss   Tue Oct 07, 2016  6:39 PM)    pravastatin (PRAVACHOL) 40 MG tablet  [id number] No TAKE 1 TABLET(40 MG) BY MOUTH DAILY Croitoru, Mihai, MD Taking Active   ramipril (ALTACE) 10 MG capsule [id number] No TAKE 1 CAPSULE(10 MG) BY MOUTH DAILY Croitoru, Mihai, MD Taking Active   traMADol (ULTRAM) 50 MG tablet [id number] No Take 1 tablet (50 mg total) by mouth every 6 (six) hours as needed.  Patient taking differently: Take 7.5 mg by mouth every 6 (six) hours as needed. TOTAL 7.5MG    Versie Starks, PA-C Taking Active Self  traZODone (DESYREL) 100 MG tablet [id number] No Take 50 mg by mouth at bedtime as needed for sleep.  [provider] Taking Active Self           Med Note Fabio Bering   Tue Oct 07, 2016  6:39 PM)              Care Plan : RN Care Manager POC  Updates made by Hayden Pedro, RN since 02/21/2021 12:00 AM     Problem: Chronic Disease Mgmt of Chronic Conditions(COPD,DM)   Priority: High     Long-Range Goal: Development of POC for Mgmt of Chronic Conditions-COPD,DM   Start Date: 11/21/2020  Expected End Date: 11/21/2021  This Visit's Progress: On track  Priority: High  Note:    Current Barriers:  Chronic Disease Management support and education needs related to COPD and DMII  RNCM Clinical Goal(s):  Patient will verbalize understanding of plan for management of COPD and DMII verbalize basic understanding  of  COPD and DMII disease process and self health management plan for chronic conditions take all medications exactly as prescribed and will call provider for medication related questions demonstrate Ongoing adherence to prescribed treatment plan for COPD and DMII as evidenced by lowering of A1C level and no minimal COPD exacerbations  through collaboration with RN Care manager, provider, and care team.   Interventions: POC sent to PCP upon initial assessment, quarterly and with any changes in patient's conditions Inter-disciplinary care team collaboration (see longitudinal plan of care) Evaluation of current  treatment plan related to  self management and patient's adherence to plan as established by provider   Diabetes Interventions:  (Status:  Condition stable.  Not addressed this visit.) Long Term Goal Assessed patient's understanding of A1c goal: <7%  Lab Results  Component Value Date   HGBA1C 6.4 (H) 12/22/2019     Diabetes Interventions:  Patient reports an intentional wgt loss of about 12 lbs within the past 6 months or more as he verbalizes wgt loss with positively affect his overall health in a good way. Assessed patient's understanding of A1c goal: <7% Provided education to patient about basic DM disease process; Reviewed medications with patient and discussed importance of medication adherence; Praised patient for lowering of A1C level based on most recent results (previous A1C 6.5-April 2022) Lab Results  Component Value Date   HGBA1C 6.4 (H) 12/22/2019  COPD Interventions:  (Status:  Goal on track:  Yes.) Long Term Goal   COPD Interventions:  Patient reports he has noticed a slight change in his breathing pattern since weather/season has changed. He is using inhalers as ordered with good relief and avoiding triggers. Advised patient to track and manage COPD triggers;  Advised patient to self assesses COPD action plan zone and make appointment with provider if in the yellow zone for 48 hours without improvement; Assessed for vaccination/immunization status -patient received flu shot at PCP appt-11/01/20 02/21/21-Patient reports that his breathing has been fair. Weather does trigger some issues at times but sxs managed.  Patient Goals/Self-Care Activities: Patient will self administer medications as prescribed Patient will attend all scheduled provider appointments Patient will call provider office for new concerns or questions Patient will continue to monitor cbgs in the home and adhere to diabetic diet Patient will have no exacerbation of sxs and unplanned hospital admission  over the next 90 days    Follow Up Plan:  Telephone follow up appointment with care management team member scheduled for:  quarterly-in the month of May The patient has been provided with contact information for the care management team and has been advised to call with any health related questions or concerns.        Plan: RN CM discussed with patient next quarterly outreach within the month of May. Patient agrees to care plan and follow up. RN CM will send quarterly update to PCP.   Enzo Montgomery, RN,BSN,CCM [place] Management Telephonic Care Management Coordinator Direct Phone: [phone] Toll Free: [phone] Fax: [phone]

## 2021-03-19 ENCOUNTER — Emergency Department: Payer: Medicare HMO

## 2021-03-19 ENCOUNTER — Encounter: Payer: Self-pay | Admitting: Emergency Medicine

## 2021-03-19 ENCOUNTER — Other Ambulatory Visit: Payer: Self-pay

## 2021-03-19 ENCOUNTER — Inpatient Hospital Stay
Admission: EM | Admit: 2021-03-19 | Discharge: 2021-03-22 | DRG: 291 | Disposition: A | Discharge status: Home / Self Care | Payer: Medicare HMO | Attending: Hospitalist | Admitting: Hospitalist

## 2021-03-19 DIAGNOSIS — I700 Atherosclerosis of aorta: Secondary | ICD-10-CM | POA: Diagnosis not present

## 2021-03-19 DIAGNOSIS — I509 Heart failure, unspecified: Secondary | ICD-10-CM | POA: Diagnosis not present

## 2021-03-19 DIAGNOSIS — J441 Chronic obstructive pulmonary disease with (acute) exacerbation: Secondary | ICD-10-CM | POA: Diagnosis not present

## 2021-03-19 DIAGNOSIS — I672 Cerebral atherosclerosis: Secondary | ICD-10-CM | POA: Diagnosis not present

## 2021-03-19 DIAGNOSIS — I2510 Atherosclerotic heart disease of native coronary artery without angina pectoris: Secondary | ICD-10-CM | POA: Diagnosis not present

## 2021-03-19 DIAGNOSIS — J9811 Atelectasis: Secondary | ICD-10-CM | POA: Diagnosis not present

## 2021-03-19 DIAGNOSIS — Z833 Family history of diabetes mellitus: Secondary | ICD-10-CM | POA: Diagnosis not present

## 2021-03-19 DIAGNOSIS — F32A Depression, unspecified: Secondary | ICD-10-CM | POA: Diagnosis present

## 2021-03-19 DIAGNOSIS — I340 Nonrheumatic mitral (valve) insufficiency: Secondary | ICD-10-CM | POA: Diagnosis present

## 2021-03-19 DIAGNOSIS — Z880 Allergy status to penicillin: Secondary | ICD-10-CM | POA: Diagnosis not present

## 2021-03-19 DIAGNOSIS — J449 Chronic obstructive pulmonary disease, unspecified: Secondary | ICD-10-CM | POA: Diagnosis present

## 2021-03-19 DIAGNOSIS — K219 Gastro-esophageal reflux disease without esophagitis: Secondary | ICD-10-CM | POA: Diagnosis present

## 2021-03-19 DIAGNOSIS — Z7982 Long term (current) use of aspirin: Secondary | ICD-10-CM | POA: Diagnosis not present

## 2021-03-19 DIAGNOSIS — G4700 Insomnia, unspecified: Secondary | ICD-10-CM | POA: Diagnosis present

## 2021-03-19 DIAGNOSIS — I5043 Acute on chronic combined systolic (congestive) and diastolic (congestive) heart failure: Secondary | ICD-10-CM | POA: Diagnosis not present

## 2021-03-19 DIAGNOSIS — I110 Hypertensive heart disease with heart failure: Principal | ICD-10-CM | POA: Diagnosis present

## 2021-03-19 DIAGNOSIS — I447 Left bundle-branch block, unspecified: Secondary | ICD-10-CM | POA: Diagnosis present

## 2021-03-19 DIAGNOSIS — I252 Old myocardial infarction: Secondary | ICD-10-CM

## 2021-03-19 DIAGNOSIS — I5021 Acute systolic (congestive) heart failure: Secondary | ICD-10-CM | POA: Diagnosis not present

## 2021-03-19 DIAGNOSIS — E669 Obesity, unspecified: Secondary | ICD-10-CM | POA: Diagnosis present

## 2021-03-19 DIAGNOSIS — Z7902 Long term (current) use of antithrombotics/antiplatelets: Secondary | ICD-10-CM

## 2021-03-19 DIAGNOSIS — G4733 Obstructive sleep apnea (adult) (pediatric): Secondary | ICD-10-CM | POA: Diagnosis not present

## 2021-03-19 DIAGNOSIS — I69392 Facial weakness following cerebral infarction: Secondary | ICD-10-CM | POA: Diagnosis not present

## 2021-03-19 DIAGNOSIS — J189 Pneumonia, unspecified organism: Secondary | ICD-10-CM | POA: Diagnosis not present

## 2021-03-19 DIAGNOSIS — J9601 Acute respiratory failure with hypoxia: Secondary | ICD-10-CM | POA: Diagnosis not present

## 2021-03-19 DIAGNOSIS — R0902 Hypoxemia: Secondary | ICD-10-CM | POA: Diagnosis not present

## 2021-03-19 DIAGNOSIS — F419 Anxiety disorder, unspecified: Secondary | ICD-10-CM | POA: Diagnosis present

## 2021-03-19 DIAGNOSIS — Z8249 Family history of ischemic heart disease and other diseases of the circulatory system: Secondary | ICD-10-CM | POA: Diagnosis not present

## 2021-03-19 DIAGNOSIS — Z981 Arthrodesis status: Secondary | ICD-10-CM

## 2021-03-19 DIAGNOSIS — J439 Emphysema, unspecified: Secondary | ICD-10-CM | POA: Diagnosis not present

## 2021-03-19 DIAGNOSIS — I517 Cardiomegaly: Secondary | ICD-10-CM | POA: Diagnosis not present

## 2021-03-19 DIAGNOSIS — R079 Chest pain, unspecified: Secondary | ICD-10-CM | POA: Diagnosis not present

## 2021-03-19 DIAGNOSIS — Z87891 Personal history of nicotine dependence: Secondary | ICD-10-CM

## 2021-03-19 DIAGNOSIS — J90 Pleural effusion, not elsewhere classified: Secondary | ICD-10-CM | POA: Diagnosis not present

## 2021-03-19 DIAGNOSIS — E782 Mixed hyperlipidemia: Secondary | ICD-10-CM | POA: Diagnosis present

## 2021-03-19 DIAGNOSIS — Z823 Family history of stroke: Secondary | ICD-10-CM

## 2021-03-19 DIAGNOSIS — E119 Type 2 diabetes mellitus without complications: Secondary | ICD-10-CM | POA: Diagnosis present

## 2021-03-19 DIAGNOSIS — Z7901 Long term (current) use of anticoagulants: Secondary | ICD-10-CM

## 2021-03-19 DIAGNOSIS — Z720 Tobacco use: Secondary | ICD-10-CM | POA: Diagnosis present

## 2021-03-19 DIAGNOSIS — Z6827 Body mass index (BMI) 27.0-27.9, adult: Secondary | ICD-10-CM

## 2021-03-19 LAB — BASIC METABOLIC PANEL
Anion gap: 5 (ref 5–15)
BUN: 20 mg/dL (ref 8–23)
CO2: 32 mmol/L (ref 22–32)
Calcium: 9.5 mg/dL (ref 8.9–10.3)
Chloride: 101 mmol/L (ref 98–111)
Creatinine, Ser: 1 mg/dL (ref 0.61–1.24)
GFR, Estimated: >60 mL/min (ref >60)
Glucose, Bld: 103 mg/dL — ABNORMAL HIGH (ref 70–99)
Potassium: 4.6 mmol/L (ref 3.5–5.1)
Sodium: 138 mmol/L (ref 135–145)

## 2021-03-19 LAB — CBC
HCT: 43.5 % (ref 39.0–52.0)
Hemoglobin: 13.7 g/dL (ref 13.0–17.0)
MCH: 28.4 pg (ref 26.0–34.0)
MCHC: 31.5 g/dL (ref 30.0–36.0)
MCV: 90.2 fL (ref 80.0–100.0)
Platelets: 273 10*3/uL (ref 150–400)
RBC: 4.82 MIL/uL (ref 4.22–5.81)
RDW: 13.8 % (ref 11.5–15.5)
WBC: 6.2 10*3/uL (ref 4.0–10.5)
nRBC: 0 % (ref 0.0–0.2)

## 2021-03-19 LAB — PROCALCITONIN: Procalcitonin: <0.1 ng/mL

## 2021-03-19 LAB — TROPONIN I (HIGH SENSITIVITY)
Troponin I (High Sensitivity): 22 ng/L — ABNORMAL HIGH (ref <18)
Troponin I (High Sensitivity): 22 ng/L — ABNORMAL HIGH (ref <18)

## 2021-03-19 LAB — BRAIN NATRIURETIC PEPTIDE: B Natriuretic Peptide: 197.4 pg/mL — ABNORMAL HIGH (ref 0.0–100.0)

## 2021-03-19 MED ORDER — PRAVASTATIN SODIUM 40 MG PO TABS
40.0000 mg | ORAL_TABLET | Freq: Every day | ORAL | Status: DC
  Administered 2021-03-19 – 2021-03-21 (×3): 40 mg via ORAL
  Filled 2021-03-19 (×3): qty 1

## 2021-03-19 MED ORDER — SODIUM CHLORIDE 0.9 % IV SOLN: 250.0000 mL | INTRAVENOUS | Status: DC | PRN

## 2021-03-19 MED ORDER — CLOPIDOGREL BISULFATE 75 MG PO TABS
75.0000 mg | ORAL_TABLET | Freq: Every day | ORAL | Status: DC
  Administered 2021-03-20 – 2021-03-22 (×3): 75 mg via ORAL
  Filled 2021-03-19 (×4): qty 1

## 2021-03-19 MED ORDER — IOHEXOL 350 MG/ML SOLN
75.0000 mL | Freq: Once | INTRAVENOUS | Status: AC | PRN
  Administered 2021-03-19: 75 mL via INTRAVENOUS

## 2021-03-19 MED ORDER — FUROSEMIDE 10 MG/ML IJ SOLN
40.0000 mg | Freq: Three times a day (TID) | INTRAMUSCULAR | Status: AC
  Administered 2021-03-19 – 2021-03-20 (×3): 40 mg via INTRAVENOUS
  Filled 2021-03-19 (×3): qty 4

## 2021-03-19 MED ORDER — METHYLPREDNISOLONE SODIUM SUCC 125 MG IJ SOLR
125.0000 mg | Freq: Once | INTRAMUSCULAR | Status: AC
  Administered 2021-03-19: 125 mg via INTRAVENOUS
  Filled 2021-03-19: qty 2

## 2021-03-19 MED ORDER — TRAZODONE HCL 50 MG PO TABS
50.0000 mg | ORAL_TABLET | Freq: Once | ORAL | Status: AC
  Administered 2021-03-19: 50 mg via ORAL
  Filled 2021-03-19: qty 1

## 2021-03-19 MED ORDER — RAMIPRIL 10 MG PO CAPS
10.0000 mg | ORAL_CAPSULE | Freq: Every day | ORAL | Status: DC
  Filled 2021-03-19: qty 1

## 2021-03-19 MED ORDER — IPRATROPIUM-ALBUTEROL 0.5-2.5 (3) MG/3ML IN SOLN
3.0000 mL | Freq: Once | RESPIRATORY_TRACT | Status: AC
  Administered 2021-03-19: 3 mL via RESPIRATORY_TRACT
  Filled 2021-03-19: qty 3

## 2021-03-19 MED ORDER — ALBUTEROL SULFATE (2.5 MG/3ML) 0.083% IN NEBU: 2.5000 mg | INHALATION_SOLUTION | RESPIRATORY_TRACT | Status: DC | PRN

## 2021-03-19 MED ORDER — ASPIRIN 81 MG PO CHEW
81.0000 mg | CHEWABLE_TABLET | Freq: Every day | ORAL | Status: DC
  Administered 2021-03-19 – 2021-03-22 (×4): 81 mg via ORAL
  Filled 2021-03-19 (×4): qty 1

## 2021-03-19 MED ORDER — ACETAMINOPHEN 325 MG PO TABS: 650.0000 mg | ORAL_TABLET | ORAL | Status: DC | PRN

## 2021-03-19 MED ORDER — ONDANSETRON HCL 4 MG/2ML IJ SOLN: 4.0000 mg | Freq: Four times a day (QID) | INTRAMUSCULAR | Status: DC | PRN

## 2021-03-19 MED ORDER — METHYLPREDNISOLONE SODIUM SUCC 40 MG IJ SOLR
40.0000 mg | Freq: Two times a day (BID) | INTRAMUSCULAR | Status: DC
  Administered 2021-03-19 – 2021-03-21 (×4): 40 mg via INTRAVENOUS
  Filled 2021-03-19 (×4): qty 1

## 2021-03-19 MED ORDER — IPRATROPIUM-ALBUTEROL 0.5-2.5 (3) MG/3ML IN SOLN
3.0000 mL | Freq: Four times a day (QID) | RESPIRATORY_TRACT | Status: DC
  Administered 2021-03-19 – 2021-03-22 (×11): 3 mL via RESPIRATORY_TRACT
  Filled 2021-03-19 (×11): qty 3

## 2021-03-19 MED ORDER — NICOTINE 21 MG/24HR TD PT24: 21.0000 mg | MEDICATED_PATCH | Freq: Every day | TRANSDERMAL | Status: DC | PRN

## 2021-03-19 MED ORDER — PANTOPRAZOLE SODIUM 40 MG PO TBEC
40.0000 mg | DELAYED_RELEASE_TABLET | Freq: Every day | ORAL | Status: DC
  Administered 2021-03-20 – 2021-03-22 (×3): 40 mg via ORAL
  Filled 2021-03-19 (×4): qty 1

## 2021-03-19 MED ORDER — SODIUM CHLORIDE 0.9% FLUSH
3.0000 mL | Freq: Two times a day (BID) | INTRAVENOUS | Status: DC
  Administered 2021-03-19 – 2021-03-21 (×5): 3 mL via INTRAVENOUS

## 2021-03-19 MED ORDER — SODIUM CHLORIDE 0.9 % IV SOLN: 500.0000 mg | Freq: Once | INTRAVENOUS | Status: DC

## 2021-03-19 MED ORDER — FUROSEMIDE 10 MG/ML IJ SOLN
60.0000 mg | Freq: Once | INTRAMUSCULAR | Status: AC
  Administered 2021-03-19: 60 mg via INTRAVENOUS
  Filled 2021-03-19: qty 8

## 2021-03-19 MED ORDER — LEVOFLOXACIN IN D5W 750 MG/150ML IV SOLN
750.0000 mg | Freq: Once | INTRAVENOUS | Status: AC
  Administered 2021-03-19: 750 mg via INTRAVENOUS
  Filled 2021-03-19: qty 150

## 2021-03-19 MED ORDER — RAMIPRIL 10 MG PO CAPS
10.0000 mg | ORAL_CAPSULE | Freq: Every day | ORAL | Status: DC
  Administered 2021-03-20 – 2021-03-22 (×3): 10 mg via ORAL
  Filled 2021-03-19 (×3): qty 1

## 2021-03-19 MED ORDER — SODIUM CHLORIDE 0.9% FLUSH
3.0000 mL | INTRAVENOUS | Status: DC | PRN
  Administered 2021-03-21: 3 mL via INTRAVENOUS

## 2021-03-19 NOTE — ED Provider Notes (Signed)
[facility] Provider Note    Event Date/Time   First MD Initiated Contact with Patient 03/19/21 1233     (approximate)  History   Chief Complaint: Chest Pain and Shortness of Breath  HPI  CARDER YIN is a 71 y.o. male with a past medical history of COPD who presents to the emergency department for chest discomfort and shortness of breath.  According to the patient over the past week or so he has been experiencing in the creased shortness of breath and discomfort under his sternum.  Patient states he has been coughing without sputum production.  No fever.  Patient went to [place] clinic was satting in the upper 80s placed on oxygen and brought to the emergency department for evaluation.  Here patient noted to be mildly tachypneic around 20 to 25 breaths/min.  Continues to state mild chest pain in the center of his chest.   Physical Exam   Triage Vital Signs: ED Triage Vitals  Enc Vitals Group     BP 03/19/21 1149 (!) 138/97     Pulse Rate 03/19/21 1149 80     Resp 03/19/21 1149 18     Temp 03/19/21 1149 98.5 F (36.9 C)     Temp Source 03/19/21 1149 Oral     SpO2 03/19/21 1149 97 %     Weight 03/19/21 1147 205 lb (93 kg)     Height 03/19/21 1147 5\' 11"  (1.803 m)     Head Circumference --      Peak Flow --      Pain Score 03/19/21 1147 8     Pain Loc --      Pain Edu? --      Excl. in [place]? --     Most recent vital signs: Vitals:   03/19/21 1149  BP: (!) 138/97  Pulse: 80  Resp: 18  Temp: 98.5 F (36.9 C)  SpO2: 97%    General: Awake, no distress.  CV:  Good peripheral perfusion.  Regular rate and rhythm  Resp:  Mild tachypnea, diminished breath sounds bilaterally. Abd:  No distention.  Soft, nontender.  No rebound or guarding. Other:  No lower extremity edema or tenderness.   ED Results / Procedures / Treatments   EKG  EKG viewed and interpreted by myself shows a normal sinus rhythm at 81 bpm with a widened QRS, normal axis,  normal intervals nonspecific ST changes consistent with left bundle branch block.  No significant change from prior EKG 12/21/2020.  RADIOLOGY  I personally viewed the chest x-ray images.  Patient appears to have scar tissue worse atelectasis versus pneumonia in the bilateral lower lobes more so on the right lower lobe. Radiology is read the x-ray as COPD with linear densities in the lower lung fields that have worsened.  Could suggest atelectasis pneumonia or scarring.   MEDICATIONS ORDERED IN ED: Medications  methylPREDNISolone sodium succinate (SOLU-MEDROL) 125 mg/2 mL injection 125 mg (125 mg Intravenous Given 03/19/21 1320)  ipratropium-albuterol (DUONEB) 0.5-2.5 (3) MG/3ML nebulizer solution 3 mL (3 mLs Nebulization Given 03/19/21 1320)  ipratropium-albuterol (DUONEB) 0.5-2.5 (3) MG/3ML nebulizer solution 3 mL (3 mLs Nebulization Given 03/19/21 1320)     IMPRESSION / MDM / ASSESSMENT AND PLAN / ED COURSE  I reviewed the triage vital signs and the nursing notes.  Patient presents to the emergency department for worsening shortness of breath x1 week along with chest discomfort.  In the emergency department patient has diminished breath sounds bilaterally.  Patient is hypoxic in the upper 80s on room air placed on 2 L and satting in the upper 90s.  States discomfort to the mid sternum worse with deep inspiration or movement.  Patient becomes very short of breath with minimal exertion such as sitting up in bed.  Patient's lab work so far is reassuring troponin is slightly elevated at 22, repeat troponin pending.  Chemistry is normal with normal renal function.  CBC is normal.  Given the chest x-ray findings of pneumonia versus atelectasis for scarring as well as the chest pain worse with deep inspiration we will proceed with a CT angiography to both rule out pulmonary embolism and also to further evaluate chest x-ray findings.  Patient is agreeable to this plan of care.  Given the patient's hypoxia  on room air history of COPD we will dose 125 mg of Solu-Medrol and DuoNebs and continue to monitor for improvement.  I do anticipate likely admission to the hospital once patient's emergency department work-up is been completed.  CT scan shows no PE.  Congestive heart failure changes mildly enlarged lymph nodes likely reactive.  Given the patient's CT findings which I have personally reviewed the images we will cover with Levaquin as a precaution given the anaphylactic penicillin allergy.  We will also dose Lasix in addition patient has received Solu-Medrol and DuoNebs.  We will admit to the hospital service given the patient's hypoxia with no baseline O2 requirement.  FINAL CLINICAL IMPRESSION(S) / ED DIAGNOSES   Dyspnea COPD exacerbation Community-acquired pneumonia CHF exacerbation    Note:  This document was prepared using Dragon voice recognition software and may include unintentional dictation errors.   Harvest Dark, MD 03/19/21 1447

## 2021-03-19 NOTE — H&P (Signed)
HISTORY & PHYSICAL  Patient: Louis Gutierrez 71 y.o. male MRN: [id number]  Today 03/19/21 is hospital day 0 after admission on 03/19/2021 12:31 PM  RECORD REVIEW AND HOSPITAL COURSE: Presented to emergency department with chief complaint of chest discomfort and shortness of breath, presented from outpatient clinic where his SaO2 was 88% on room air.  Increased shortness of breath and sternal chest pain with breathing over the past week.  In ED: Tachypnea 20 to 25 breaths/min, SPO2 97%, EKG showed NSR, wide QRS, nonspecific ST changes consistent with LBBB, no significant change from 12/21/2020.  CXR showed scar tissue/worsening atelectasis versus pneumonia in bilateral lower lobes, greater on the right.  Patient was treated for COPD exacerbation/possible pneumonia with Levaquin (penicillin allergy), Solu-Medrol, DuoNeb.  CT chest demonstrated CHF type changes, new pulmonary edema.  Patient was treated for CHF with Lasix.  Procedures and Significant Results:  In ED: CXR equivocal, CT demonstrated no PE, concerning for CHF with underlying COPD, no strong evidence for pneumonia.  Troponins flat at 22  Consultants:  None    HPI / SUBJECTIVE:  Patient seen and examined in emergency department, resting comfortably in bed no apparent distress.  He reports improvement in symptoms of shortness of breath since being in the ED on oxygen and receiving some Lasix as well as breathing treatments, denies fever/chills.  Reports adherence to home medications, he is a bit surprised that his heart might be an issue given that his most recent cardiology visit back in December he was discontinued from his long-acting nitroglycerin, he has otherwise been feeling okay.  He denies recent illness, recent increase in salt intake, he confirms HPI as noted above: Increasing shortness of breath over the span of the week, sent here today from outpatient clinic with low SaO2 on room air.     ASSESSMENT & PLAN  Shortness  of breath most likely acute CHF complicated by COPD exacerbation  Acute CHF (congestive heart failure) (HCC) History of STEMI/CAD, no concern for ACS at this time, continue telemetry Last echocardiogram on file 10/09/2016, indication was CVA, EF 50 to 55%, hypokinesis of inferior myocardium Continue diuresis, strict I&O Echo pending BNP added to labs, pending  CAD (coronary artery disease) Continue home meds Taking ASA/Plavix d/t hx CVA  COPD (chronic obstructive pulmonary disease) (HCC) Continue O2 via [place], DuoNeb scheduled, albuterol prn, SoluMedrol scheduled If SOB not improving w/ diuresis / CHF treatment, may need walk test / home O2 consideration Takes inhalers at home prn, started scheduled Anoro, may need to ensure insurance coverage / other access for whichever inhaler he goes home with  Pneumonia seems unlikely, will hold off on continuing antibiotics, blood cultures are pending, may consider repeat chest x-ray after diuresis, patient denies fever/cough, discussed with patient that we are holding off on antibiotics and he is okay with this plan has no further concerns   OSA (obstructive sleep apnea) CPAP qhs TOC consulted to ensure home CPAP, he has not had one in some time  Tobacco abuse nicoderm prn   Atherosclerotic cerebrovascular disease w/ Hx CVA 2018 Continue ASA/Plavix, statin    VTE Ppx: ambulation, continue ASA/Plavix CODE STATUS   Code Status: Full Code Admitted from: home (sent from outpatient clinic)  Expected Dispo: home Barriers to discharge: continued medical w/u and treatment  Family communication: pt states has been communicating w/ his wife, he declines my offer to call her as well, he is alert/oriented and has full capacity  Past Medical History:  Diagnosis Date   Adrenal adenoma, left    Anxiety    Aortic atherosclerosis (HCC)    Asthma without status asthmaticus    Atherosclerotic cerebrovascular disease     Overview:  Small and facial weakness with minimal weakness    CAD (coronary artery disease)    a. 05/2010 Inf STEMI/PCI: RCA 1106m (3.5x24 Promus DES), LCX small, 100, D1 50ost, D2 40ost, EF 45-50%; b. 03/2013 Cath ([facility]): 40% mLAD, 20% stenosis mRCA (site of the previous stent); LVEF 55%.   Cervical disc disease    a. s/p C3-4 fusion 2003.   Cervical myelopathy ([place])    a. 09/2016 Admit w/ left sided wkns->found to have progressive cervical disc dzs above and below prior fusioin site; b. 10/2016 s/p ant cervical diskectomy.   COPD (chronic obstructive pulmonary disease) ([place])    Cryptogenic stroke ([place]) 05/2013    s/p acute L MCA territory infarct; b. 07/2013 s/p MDT Linq ILR-->No AFib to documented to date (09/2016).   Current use of long term anticoagulation    DAPT therapy (ASA + clopidogrel)   Depression    Diabetes mellitus without complication (HCC)    Essential (primary) hypertension    Gastritis    GERD (gastroesophageal reflux disease)    H/O insomnia    History of cerebrovascular accident (CVA) with residual deficit 2015   facial drooping on one side of face is only residual since stroke   IDA (iron deficiency anemia)    Inguinal hernia    Insomnia disorder    Ischemic cardiomyopathy    a. 05/2010 LV gram: EF 45-50% @ time of inf MI; b. 09/2016 Echo: EF 50-55%, inf HK, Gr1 DD, mild MR.   Mixed hyperlipidemia    Nephrolithiasis    Obesity    OSA (obstructive sleep apnea)    no cpap   Osteoarthritis    STEMI (ST elevation myocardial infarction) ([place]) 06/07/2010   1 stent   Tobacco abuse    Urinary incontinence    Varicose veins    Vertigo     Past Surgical History:  Procedure Laterality Date   ANTERIOR CERVICAL DECOMP/DISCECTOMY FUSION N/A 10/22/2016   Procedure: ANTERIOR CERVICAL DECOMPRESSION/DISCECTOMY FUSION 2 LEVELS C3-4, C6-7;  Surgeon: Meade Maw, MD;  Location: [facility] ORS;  Service: Neurosurgery;  Laterality: N/A;   CARDIAC CATHETERIZATION  06/07/2010   50  % ostial narrowing in 1st diagonal from LAD, 2nd diagonal had 40% ostial narrowing; AV groove circumflex was diminutive and occluded proximally; RCA totally occluded in mid segment - stented with 3.5 x 58mm Promus Element DES  - See Media tab   CARDIAC CATHETERIZATION  04/04/2013   40% mLAD, 20% mRCA at the site of the previous stent.  LVEF was 55%.   COLONOSCOPY WITH PROPOFOL N/A 02/26/2015   Procedure: COLONOSCOPY WITH PROPOFOL;  Surgeon: Josefine Class, MD;  Location: [facility] ENDOSCOPY;  Service: Endoscopy;  Laterality: N/A;   COLONOSCOPY WITH PROPOFOL N/A 03/16/2020   Procedure: COLONOSCOPY WITH PROPOFOL;  Surgeon: Lesly Rubenstein, MD;  Location: [facility] ENDOSCOPY;  Service: Endoscopy;  Laterality: N/A;   ESOPHAGOGASTRODUODENOSCOPY  02/26/2015   Procedure: ESOPHAGOGASTRODUODENOSCOPY (EGD);  Surgeon: Josefine Class, MD;  Location: [facility] ENDOSCOPY;  Service: Endoscopy;;   ESOPHAGOGASTRODUODENOSCOPY (EGD) WITH PROPOFOL N/A 03/16/2020   Procedure: ESOPHAGOGASTRODUODENOSCOPY (EGD) WITH PROPOFOL;  Surgeon: Lesly Rubenstein, MD;  Location: [facility] ENDOSCOPY;  Service: Endoscopy;  Laterality: N/A;   EYE SURGERY     cataract replaced   LOOP RECORDER IMPLANT  08/16/13   Dr. Sallyanne Kuster   XI ROBOTIC ASSISTED INGUINAL HERNIA REPAIR WITH MESH Left 05/23/2020   Procedure: XI ROBOTIC [place];  Surgeon: Herbert Pun, MD;  Location: [facility] ORS;  Service: General;  Laterality: Left;    Family History  Problem Relation Age of Onset   Cancer Mother        oral   Stroke Maternal Grandmother    Diabetes Brother    Coronary artery disease Brother    Social History:  reports that he quit smoking about 16 months ago. His smoking use included cigarettes. He has a 53.00 pack-year smoking history. He has never used smokeless tobacco. He reports that he does not drink alcohol and does not use drugs.  Allergies:  Allergies  Allergen Reactions   Penicillins Anaphylaxis, Hives  and Nausea And Vomiting    As a child Has patient had a PCN reaction causing immediate rash, facial/tongue/throat swelling, SOB or lightheadedness with hypotension: No Has patient had a PCN reaction causing severe rash involving mucus membranes or skin necrosis: No Has patient had a PCN reaction that required hospitalization No Has patient had a PCN reaction occurring within the last 10 years: No If all of the above answers are "NO", then may proceed with Cephalosporin use.     (Not in a hospital admission)   Results for orders placed or performed during the hospital encounter of 03/19/21 (from the past 48 hour(s))  Basic metabolic panel     Status: Abnormal   Collection Time: 03/19/21 11:54 AM  Result Value Ref Range   Sodium 138 135 - 145 mmol/L   Potassium 4.6 3.5 - 5.1 mmol/L   Chloride 101 98 - 111 mmol/L   CO2 32 22 - 32 mmol/L   Glucose, Bld 103 (H) 70 - 99 mg/dL    Comment: Glucose reference range applies only to samples taken after fasting for at least 8 hours.   BUN 20 8 - 23 mg/dL   Creatinine, Ser 1.00 0.61 - 1.24 mg/dL   Calcium 9.5 8.9 - 10.3 mg/dL   GFR, Estimated >60 >60 mL/min    Comment: (NOTE) Calculated using the CKD-EPI Creatinine Equation (2021)    Anion gap 5 5 - 15    Comment: Performed at [facility], [address] [place], [place] [address]  CBC     Status: None   Collection Time: 03/19/21 11:54 AM  Result Value Ref Range   WBC 6.2 4.0 - 10.5 K/uL   RBC 4.82 4.22 - 5.81 MIL/uL   Hemoglobin 13.7 13.0 - 17.0 g/dL   HCT 43.5 39.0 - 52.0 %   MCV 90.2 80.0 - 100.0 fL   MCH 28.4 26.0 - 34.0 pg   MCHC 31.5 30.0 - 36.0 g/dL   RDW 13.8 11.5 - 15.5 %   Platelets 273 150 - 400 K/uL   nRBC 0.0 0.0 - 0.2 %    Comment: Performed at [facility], [place], [place] [address]  Troponin I (High Sensitivity)     Status: Abnormal   Collection Time: 03/19/21 11:54 AM  Result Value Ref Range   Troponin I (High Sensitivity) 22  (H) <18 ng/L    Comment: (NOTE) Elevated high sensitivity troponin I (hsTnI) values and significant  changes across serial measurements may suggest ACS but many other  chronic and acute conditions are known to elevate hsTnI results.  Refer to the "Links" section for chest pain algorithms and additional  guidance. Performed at [facility], [place], [place] [address]   Troponin I (High Sensitivity)     Status: Abnormal   Collection Time: 03/19/21  2:31 PM  Result Value Ref Range   Troponin I (High Sensitivity) 22 (H) <18 ng/L    Comment: (NOTE) Elevated high sensitivity troponin I (hsTnI) values and significant  changes across serial measurements may suggest ACS but many other  chronic and acute conditions are known to elevate hsTnI results.  Refer to the "Links" section for chest pain algorithms and additional  guidance. Performed at [facility], [address] [place], [place] [address]    DG Chest 2 View  Result Date: 03/19/2021 CLINICAL DATA:  Chest pain EXAM: CHEST - 2 VIEW COMPARISON:  05/23/2020 FINDINGS: Transverse diameter of heart is increased. There are linear patchy infiltrates in both lower lung fields with possible interval worsening in the right lower lung fields. Low position of diaphragms suggests COPD. There is interval clearing of subcutaneous emphysema in the left chest wall. There is no pneumothorax. IMPRESSION: COPD. There are linear densities in the lower lung fields with interval worsening in the right lower lung fields. Findings suggest scarring and possibly subsegmental atelectasis/pneumonia. Cardiomegaly. There are no signs of alveolar pulmonary edema. Electronically Signed   By: Elmer Picker M.D.   On: 03/19/2021 12:29   CT Angio Chest PE W and/or Wo Contrast  Result Date: 03/19/2021 CLINICAL DATA:  Substernal chest pain and shortness of breath for the past week. EXAM: CT ANGIOGRAPHY CHEST WITH CONTRAST TECHNIQUE:  Multidetector CT imaging of the chest was performed using the standard protocol during bolus administration of intravenous contrast. Multiplanar CT image reconstructions and MIPs were obtained to evaluate the vascular anatomy. RADIATION DOSE REDUCTION: This exam was performed according to the departmental dose-optimization program which includes automated exposure control, adjustment of the mA and/or kV according to patient size and/or use of iterative reconstruction technique. CONTRAST:  21mL OMNIPAQUE IOHEXOL 350 MG/ML SOLN COMPARISON:  Chest x-ray from same day. CT chest dated August 06, 2020. FINDINGS: Cardiovascular: Satisfactory opacification of the pulmonary arteries to the segmental level. No evidence of pulmonary embolism. Unchanged cardiomegaly. No pericardial effusion. No thoracic aortic aneurysm. Coronary, aortic arch, and branch vessel atherosclerotic vascular disease. Reflux of contrast into the IVC and hepatic veins. Mediastinum/Nodes: New mildly enlarged subcarinal and right hilar lymph nodes measuring up to 1.3 cm in short axis. No enlarged axillary lymph nodes. The thyroid gland, trachea, and esophagus demonstrate no significant findings. Lungs/Pleura: Similar moderate to severe centrilobular emphysema with prominent central peribronchial thickening. New small right and trace left pleural effusions. New scattered smooth interlobular septal thickening. Chronic smooth bilateral pleural thickening. Chronic pleuroparenchymal scarring in both lungs. New dependent atelectasis in the posterior right lower lobe. No focal consolidation or pneumothorax. Upper Abdomen: No acute abnormality. Unchanged 4.1 cm left adrenal adenoma. Musculoskeletal: No chest wall abnormality. No acute or significant osseous findings. Review of the MIP images confirms the above findings. IMPRESSION: 1. No evidence of pulmonary embolism. 2. New mild congestive heart failure. Evidence of right heart dysfunction. 3. New mildly enlarged  subcarinal and right hilar lymph nodes, nonspecific, but likely reactive. 4. Unchanged 4.1 cm left adrenal adenoma. 5. Aortic Atherosclerosis (ICD10-I70.0) and Emphysema (ICD10-J43.9). Electronically Signed   By: Titus Dubin M.D.   On: 03/19/2021 14:35    Review of Systems  Constitutional:  Positive for fatigue. Negative for chills and fever.  HENT:  Negative for congestion, rhinorrhea, sinus  pressure and sore throat.   Respiratory:  Positive for chest tightness and shortness of breath. Negative for apnea, cough and choking.   Cardiovascular:  Positive for chest pain. Negative for palpitations and leg swelling.  Gastrointestinal:  Negative for abdominal distention, abdominal pain, constipation and diarrhea.  Genitourinary:  Negative for difficulty urinating.  Musculoskeletal:  Negative for arthralgias.  Neurological:  Negative for dizziness, syncope, facial asymmetry, light-headedness, numbness and headaches.  Psychiatric/Behavioral:  Negative for confusion and dysphoric mood. The patient is not nervous/anxious.    Blood pressure (!) 151/84, pulse 94, temperature 97.7 F (36.5 C), temperature source Oral, resp. rate 19, height 5\' 11"  (1.803 m), weight 93 kg, SpO2 95 %. Physical Exam Constitutional:      General: He is not in acute distress.    Appearance: He is well-developed and normal weight.  HENT:     Head: Normocephalic and atraumatic.  Eyes:     Extraocular Movements: Extraocular movements intact.  Cardiovascular:     Rate and Rhythm: Normal rate and regular rhythm.  Pulmonary:     Effort: Pulmonary effort is normal. No respiratory distress.     Breath sounds: Examination of the right-upper field reveals wheezing. Examination of the left-upper field reveals wheezing. Examination of the right-middle field reveals wheezing. Examination of the left-middle field reveals wheezing. Examination of the right-lower field reveals decreased breath sounds and rales. Examination of the  left-lower field reveals decreased breath sounds and rales. Decreased breath sounds, wheezing and rales present.  Chest:     Chest wall: No tenderness.  Abdominal:     Palpations: Abdomen is soft.     Tenderness: There is no abdominal tenderness. There is no rebound.  Musculoskeletal:     Cervical back: Normal range of motion and neck supple.     Right lower leg: Edema (trace) present.     Left lower leg: Edema (trace) present.  Skin:    General: Skin is warm and dry.  Neurological:     General: No focal deficit present.     Mental Status: He is alert and oriented to person, place, and time.  Psychiatric:        Mood and Affect: Mood normal.        Behavior: Behavior normal.       Emeterio Reeve, DO 03/19/2021, 3:58 PM

## 2021-03-19 NOTE — ED Notes (Signed)
Inpatient RN Jackquline Bosch sent a secure chat message at this time

## 2021-03-19 NOTE — ED Notes (Signed)
Floor called and RN Jackquline Bosch asked to please read secure chat message in order for pt handoff to be completed. Secretary Merline notified on 2A

## 2021-03-19 NOTE — Assessment & Plan Note (Signed)
nicoderm prn

## 2021-03-19 NOTE — Assessment & Plan Note (Addendum)
Has not had sleep study for a long time, so can't order him CPAP for home, per TOC

## 2021-03-19 NOTE — ED Triage Notes (Signed)
Pt via POV from [facility]. Pt c/o pain right under his sternum and SOB for the past week. States he had a MI 10 years ago. Per [place], pt was hypoxic and placed pt on 2L [place]. Pt is having some increased WOB, states that the O2 is helping.

## 2021-03-19 NOTE — Assessment & Plan Note (Signed)
Continue ASA/Plavix, statin

## 2021-03-19 NOTE — Assessment & Plan Note (Addendum)
Last echocardiogram on file 10/09/2016, indication was CVA, EF 50 to 55% --current Echo LVEF 35-40% with grade I DD --cont IV lasix 40 mg BID

## 2021-03-19 NOTE — ED Notes (Addendum)
After changing into a gown with no O2 pt dropped to 91% with increased work of breathing. Pt placed back on 2L [place]

## 2021-03-19 NOTE — Assessment & Plan Note (Addendum)
Cont home ASA and statin

## 2021-03-19 NOTE — ED Notes (Signed)
Floor able to accept report at this time per ED charge RN Lorriane Shire and 2A charge RN Solmon Ice

## 2021-03-20 ENCOUNTER — Inpatient Hospital Stay (HOSPITAL_COMMUNITY)
Admit: 2021-03-20 | Discharge: 2021-03-20 | Disposition: A | Discharge status: Home / Self Care | Payer: Medicare HMO | Attending: Osteopathic Medicine | Admitting: Osteopathic Medicine

## 2021-03-20 DIAGNOSIS — I5043 Acute on chronic combined systolic (congestive) and diastolic (congestive) heart failure: Secondary | ICD-10-CM

## 2021-03-20 DIAGNOSIS — I5021 Acute systolic (congestive) heart failure: Secondary | ICD-10-CM | POA: Diagnosis not present

## 2021-03-20 LAB — CBC
HCT: 44.6 % (ref 39.0–52.0)
Hemoglobin: 14.3 g/dL (ref 13.0–17.0)
MCH: 28 pg (ref 26.0–34.0)
MCHC: 32.1 g/dL (ref 30.0–36.0)
MCV: 87.5 fL (ref 80.0–100.0)
Platelets: 320 10*3/uL (ref 150–400)
RBC: 5.1 MIL/uL (ref 4.22–5.81)
RDW: 13.5 % (ref 11.5–15.5)
WBC: 7.4 10*3/uL (ref 4.0–10.5)
nRBC: 0 % (ref 0.0–0.2)

## 2021-03-20 LAB — ECHOCARDIOGRAM COMPLETE
AR max vel: 2.53 cm2
AV Area VTI: 2.91 cm2
AV Area mean vel: 2.57 cm2
AV Mean grad: 5 mmHg
AV Peak grad: 8.9 mmHg
Ao pk vel: 1.49 m/s
Area-P 1/2: 8.43 cm2
Height: 71 in
MV VTI: 2.56 cm2
S' Lateral: 5.73 cm
Weight: 3086.44 oz

## 2021-03-20 LAB — PROCALCITONIN: Procalcitonin: <0.1 ng/mL

## 2021-03-20 LAB — BASIC METABOLIC PANEL
Anion gap: 13 (ref 5–15)
BUN: 30 mg/dL — ABNORMAL HIGH (ref 8–23)
CO2: 28 mmol/L (ref 22–32)
Calcium: 9.7 mg/dL (ref 8.9–10.3)
Chloride: 96 mmol/L — ABNORMAL LOW (ref 98–111)
Creatinine, Ser: 1.17 mg/dL (ref 0.61–1.24)
GFR, Estimated: >60 mL/min (ref >60)
Glucose, Bld: 268 mg/dL — ABNORMAL HIGH (ref 70–99)
Potassium: 3.7 mmol/L (ref 3.5–5.1)
Sodium: 137 mmol/L (ref 135–145)

## 2021-03-20 MED ORDER — TRAZODONE HCL 50 MG PO TABS
50.0000 mg | ORAL_TABLET | Freq: Once | ORAL | Status: AC
  Administered 2021-03-20: 50 mg via ORAL
  Filled 2021-03-20: qty 1

## 2021-03-20 NOTE — TOC Initial Note (Signed)
Transition of Care (TOC) - Initial/Assessment Note  ? ? ?Patient Details  ?Name: Louis Gutierrez ?MRN: [id number] ?Date of Birth: 07/30/1950 ? ?Transition of Care (TOC) CM/SW Contact:    ?Candie Chroman, LCSW ?Phone Number: ?03/20/2021, 12:49 PM ? ?Clinical Narrative:   CSW met with patient. No supports at bedside. CSW introduced role and inquired about not having CPAP at home. Patient had one 10-15 years ago but no longer has the machine. It has been 10-15 years since his last sleep study. Told him he would need a new outpatient sleep study to see if he qualifies. Patient is agreeable. He is currently on 2 L acute oxygen. Will follow for this potential need. His wife has oxygen and cpap at home. He said he often will use her oxygen when she is not using it. Patient's wife will likely transport him at discharge. He also has 6 daughters and two sons that will likely be able to pick him up if wife cannot. No further concerns. CSW encouraged patient to contact CSW as needed. CSW will continue to follow patient for support and facilitate return home when stable.              ? ?Expected Discharge Plan: Home/Self Care ?Barriers to Discharge: Continued Medical Work up ? ? ?Patient Goals and CMS Choice ?  ?  ?  ? ?Expected Discharge Plan and Services ?Expected Discharge Plan: Home/Self Care ?  ?  ?Post Acute Care Choice: NA ?Living arrangements for the past 2 months: [place] ?                ?  ?  ?  ?  ?  ?  ?  ?  ?  ?  ? ?Prior Living Arrangements/Services ?Living arrangements for the past 2 months: [place] ?Lives with:: Spouse ?Patient language and need for interpreter reviewed:: Yes ?Do you feel safe going back to the place where you live?: Yes      ?Need for Family Participation in Patient Care: Yes (Comment) ?Care giver support system in place?: Yes (comment) ?  ?Criminal Activity/Legal Involvement Pertinent to Current Situation/Hospitalization: No - Comment as needed ? ?Activities of Daily  Living ?Home Assistive Devices/Equipment: None ?ADL Screening (condition at time of admission) ?Patient's cognitive ability adequate to safely complete daily activities?: Yes ?Is the patient deaf or have difficulty hearing?: No ?Does the patient have difficulty seeing, even when wearing glasses/contacts?: No ?Does the patient have difficulty concentrating, remembering, or making decisions?: No ?Patient able to express need for assistance with ADLs?: Yes ?Does the patient have difficulty dressing or bathing?: No ?Independently performs ADLs?: Yes (appropriate for developmental age) ?Does the patient have difficulty walking or climbing stairs?: No ?Weakness of Legs: None ?Weakness of Arms/Hands: None ? ?Permission Sought/Granted ?  ?  ?   ?   ?   ?   ? ?Emotional Assessment ?Appearance:: Appears stated age ?Attitude/Demeanor/Rapport: Engaged, Gracious ?Affect (typically observed): Accepting, Appropriate, Calm, Pleasant ?Orientation: : Oriented to Self, Oriented to Place, Oriented to  Time, Oriented to Situation ?Alcohol / Substance Use: Not Applicable ?Psych Involvement: No (comment) ? ?Admission diagnosis:  CHF (congestive heart failure) ([place]) [I50.9] ?COPD exacerbation ([place]) [J44.1] ?Community acquired pneumonia, unspecified laterality [J18.9] ?Congestive heart failure, unspecified HF chronicity, unspecified heart failure type ([place]) [I50.9] ?Patient Active Problem List  ? Diagnosis Date Noted  ? Acute CHF (congestive heart failure) ([place]) 03/19/2021  ? CHF (congestive heart failure) ([place]) 03/19/2021  ? Shortness of  breath 05/23/2020  ? At risk for obstructive sleep apnea 05/23/2020  ? Cervical myelopathy ([place]) 10/22/2016  ? Spinal stenosis 10/10/2016  ? Left inguinal hernia 02/05/2016  ? History of arterial ischemic stroke 01/31/2016  ? Left Adrenal cortical adenoma 10/16/2015  ? Nephrolithiasis 10/16/2015  ? Prostate cancer screening 10/16/2015  ? Renal cysts, congenital, bilateral 10/16/2015  ? Microscopic  hematuria 10/16/2015  ? 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 Cervical pain 12/08/2014  ? Atherosclerotic cerebrovascular disease w/ Hx CVA 2018 06/09/2014  ? COPD exacerbation ([place]) 06/09/2014  ? CAD in native artery 06/09/2014  ? Essential (primary) hypertension 06/09/2014  ? Blood glucose elevated 06/09/2014  ? Major depression in remission ([place]) 06/09/2014  ? 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 Chondrocalcinosis of knee 04/20/2014  ? Primary osteoarthritis of both knees 04/20/2014  ? Status post placement of implantable loop recorder 08/24/2013  ? Stroke of unknown cause ([place]) 08/06/2013  ? Tobacco abuse 06/30/2012  ? COPD (chronic obstructive pulmonary disease) ([place]) 06/30/2012  ? OSA (obstructive sleep apnea)   ? CAD (coronary artery disease)   ? Mixed hyperlipidemia   ? Obesity   ? Hypertension 06/10/2010  ? STEMI (ST elevation myocardial infarction) ([place]) 06/07/2010  ? ?PCP:  Kirk Ruths, MD ?Pharmacy:   ?[facility] [place], [address] ?[address] [address] ?[place] [address] ?Phone: [phone] Fax: [phone] ? ?[place], [address] ?[address] ?[place] [address] ?Phone: [phone] Fax: [phone] ? ?[facility] [place], [place] AT [facility] OF [address] ?[place] ?[place] [address] ?Phone: [phone] Fax: [phone] ? ? ? ? ?Social Determinants of Health (SDOH) Interventions ?  ? ?Readmission Risk Interventions ?No flowsheet data found. ? ? ?

## 2021-03-20 NOTE — Progress Notes (Signed)
Nutrition Brief Note ? ?Wt Readings from Last 15 Encounters:  ?03/20/21 87.5 kg  ?12/21/20 104.5 kg  ?08/29/20 92.1 kg  ?08/06/20 93.9 kg  ?05/23/20 93.9 kg  ?05/17/20 93.9 kg  ?03/16/20 96.6 kg  ?12/22/19 93.9 kg  ?05/18/19 99.3 kg  ?12/26/18 96.6 kg  ?11/30/18 99.8 kg  ?08/25/18 99.3 kg  ?11/30/17 96.3 kg  ?06/23/17 99.3 kg  ?11/25/16 97.8 kg  ? ?Patient seen and examined in emergency department, resting comfortably in bed no apparent distress.  He reports improvement in symptoms of shortness of breath since being in the ED on oxygen and receiving some Lasix as well as breathing treatments, denies fever/chills.  Reports adherence to home medications, he is a bit surprised that his heart might be an issue given that his most recent cardiology visit back in December he was discontinued from his long-acting nitroglycerin, he has otherwise been feeling okay.  He denies recent illness, recent increase in salt intake, he confirms HPI as noted above: Increasing shortness of breath over the span of the week, sent here today from outpatient clinic with low SaO2 on room air. ? ?Pt admitted with COPD and CHF exacerbation.  ? ?Reviewed I/O's: -2.9 L x 24 hours ? ?UOP: 3 L x 24 hours ? ?Spoke with pt at bedside, who was pleasant and in good spirits today. He reports he has a good appetite. He consumes 3 meals per day PTA (Breakfast: cereal; Lunch: [employer]; Dinner: meat, starch, and vegetable). Pt shares that his wife also has heart issues and they do their best ot follow a low sodium diet. Pt is very active and still works as an Cabin crew part time.  ? ?Per pt, his UBW is around 205#. He weighs himself weekly and reports that his weight loss is likely due to diuresis. Discussed importance of daily weight and when to call MD. Pt was visited by heart failure RN earlier and was able to teachback what was discussed.  ? ?Nutrition-Focused physical exam completed. Findings are no fat depletion, no muscle depletion, and no edema.    ? ?Current diet order is renal/ carb modified with 1.2 L fluid restriction, patient is consuming approximately 100% of meals at this time. Labs and medications reviewed. RD liberalized diet to lowe sodium for wider variety of meal selections. ? ?No nutrition interventions warranted at this time. If nutrition issues arise, please consult RD.  ? ?Loistine Chance, RD, LDN, CDCES ?Registered Dietitian II ?Certified Diabetes Care and Education Specialist ?Please refer to [facility] for RD and/or RD on-call/weekend/after hours pager   ?

## 2021-03-20 NOTE — Progress Notes (Signed)
?  Progress Note ? ? ?Patient: Louis Gutierrez RCV:[id number] DOB: [birth year] DOA: 03/19/2021     1 ?DOS: the patient was seen and examined on 03/20/2021 ?  ?Brief hospital course: ?No notes on file ? ?Assessment and Plan: ?* Acute on chronic combined systolic and diastolic CHF (congestive heart failure) ([place])- (present on admission) ?Last echocardiogram on file 10/09/2016, indication was CVA, EF 50 to 55% ?--current Echo LVEF 35-40% with grade I DD ?--cont IV diuresis ? ?COPD exacerbation ([place])- (present on admission) ?--cont IV solumedrol with taper ?--cont DuoNeb scheduled ? ?Atherosclerotic cerebrovascular disease w/ Hx CVA 2018- (present on admission) ?Continue ASA/Plavix, statin ? ?COPD (chronic obstructive pulmonary disease) ([place])- (present on admission) ? ? ? ?Tobacco abuse- (present on admission) ?nicoderm prn  ? ?CAD (coronary artery disease)- (present on admission) ?Cont home ASA and statin ? ?OSA (obstructive sleep apnea)- (present on admission) ?Has not had sleep study for a long time, so can't order him CPAP for home, per TOC ? ? ? ? ? ? ? ?Subjective:  ?Pt reported breathing much improved, made a lot of urine. ? ? ?Physical Exam: ?Vitals:  ? 03/20/21 1013 03/20/21 1100 03/20/21 1541 03/20/21 2023  ?BP: 113/82 116/72 116/62   ?Pulse:  78 84   ?Resp:  (!) 23 (!) 22   ?Temp:  97.7 ?F (36.5 ?C) 98.1 ?F (36.7 ?C)   ?TempSrc:  Oral Oral   ?SpO2:  94%  95%  ?Weight:      ?Height:      ? ?Constitutional: NAD, AAOx3 ?HEENT: conjunctivae and lids normal, EOMI ?CV: No cyanosis.   ?RESP: normal respiratory effort, on 2L ?Extremities: No effusions, edema in BLE ?SKIN: warm, dry ?Neuro: II - XII grossly intact.   ?Psych: Normal mood and affect.  Appropriate judgement and reason ? ? ?Data Reviewed: ? ?Family Communication:  ? ?Disposition: ?Status is: Inpatient ?Remains inpatient appropriate because: on 2L O2 new, IV lasix ? ? ? Planned Discharge Destination: Home ? ? ? ? ?Time spent: 35 minutes ? ?Author: ?Enzo Bi,  MD ?03/20/2021 8:34 PM ? ?For on call review [email or web address].  ? ?

## 2021-03-20 NOTE — Consult Note (Signed)
? ?  Heart Failure Nurse Navigator Note ? ?HFrEf 35 to 40%.  Grade 1 diastolic dysfunction.  Hypokinesis of the inferior and akinesis of the inferior lateral wall.  Normal right ventricular systolic function.  Mild mitral regurgitation.  EF had previously been reported in 2018 at 50 to 55%. ? ?He presented to the emergency room with complaints of chest pain and shortness of breath. ? ? ?Comorbidities: ? ?Coronary artery disease/STEMI ?COPD ?Obstructive sleep apnea with CPAP use ?Tobacco abuse ?CVA ? ?Medications: ? ?Aspirin 81 mg daily ?Plavix 75 mg daily ?NicoDerm patch 21 mg daily ?Pravastatin 40 mg at bedtime ?Ramipril 10 mg daily ? ?Labs: ? ?Sodium 137, potassium 3.7, chloride 96, CO2 28, BUN 30, creatinine 1.17, BNP 197 troponin 22 ?Intake not documented ?Output 490 mL ?Weight is 87.5 kg down from documented weight of 93 kg ? ? ?Initial meeting with patient, who was lying in bed in no acute distress but continued on O2 at 2 L.  Currently denied any difficulties. ? ? ?He states that he has heard the term heart failure in the past and to him it meant that the pump was not meeting the demands of the body. ? ?Discussed how he takes care of himself at home, he does not weigh himself on a daily basis but does limit his salt and sodium intake.  Stressed the importance of daily weights and reporting 2 to 3 pound weight gain overnight or 5 pounds within the week. ? ?Also discussed changes in symptoms such as increasing shortness of breath at rest or with exertion, lower extremity edema, abdominal swelling, early satiety PND and orthopnea ? ?Discussed follow-up in the outpatient heart failure clinic he has an appointment on March 9 at 9:00 he has a 4% ratio of no-show which is 4 out of 95 appointments. ? ?Also discussed the ventricle health program and he is wanting to enroll.  Paperwork has been filled out. ? ?He was given the living with heart failure teaching booklet, zone magnet and information on  low-sodium. ? ? ?Pricilla Riffle RN CHFN ?

## 2021-03-20 NOTE — Plan of Care (Signed)
?  Problem: Education: ?Goal: Knowledge of General Education information will improve ?Description: Including pain rating scale, medication(s)/side effects and non-pharmacologic comfort measures ?Outcome: Progressing ?  ?Problem: Clinical Measurements: ?Goal: Ability to maintain clinical measurements within normal limits will improve ?Outcome: Progressing ?Goal: Will remain free from infection ?Outcome: Progressing ?Goal: Diagnostic test results will improve ?Outcome: Progressing ?Goal: Respiratory complications will improve ?Outcome: Progressing ?Goal: Cardiovascular complication will be avoided ?Outcome: Progressing ?  ?Problem: Activity: ?Goal: Risk for activity intolerance will decrease ?Outcome: Progressing ?  ?Problem: Nutrition: ?Goal: Adequate nutrition will be maintained ?Outcome: Progressing ?  ?Problem: Pain Managment: ?Goal: General experience of comfort will improve ?Outcome: Progressing ?  ?Problem: Safety: ?Goal: Ability to remain free from injury will improve ?Outcome: Progressing ?  ?Problem: Respiratory: ?Goal: Ability to maintain a clear airway will improve ?Outcome: Progressing ?Goal: Levels of oxygenation will improve ?Outcome: Progressing ?Goal: Ability to maintain adequate ventilation will improve ?Outcome: Progressing ?  ?

## 2021-03-20 NOTE — Progress Notes (Signed)
*  PRELIMINARY RESULTS* ?Echocardiogram ?2D Echocardiogram has been performed. ? ?Louis Gutierrez ?03/20/2021, 12:28 PM ?

## 2021-03-20 NOTE — TOC CM/SW Note (Signed)
CSW acknowledges heart failure consult. Notified heart failure nurse navigator. ? ?Dayton Scrape, [place] ?[phone] ? ?

## 2021-03-20 NOTE — Assessment & Plan Note (Addendum)
--  transition from IV solumedrol to prednisone 40 mg daily tomorrow ?--cont DuoNeb scheduled ?

## 2021-03-21 DIAGNOSIS — I5043 Acute on chronic combined systolic (congestive) and diastolic (congestive) heart failure: Secondary | ICD-10-CM | POA: Diagnosis not present

## 2021-03-21 LAB — BASIC METABOLIC PANEL
Anion gap: 10 (ref 5–15)
BUN: 39 mg/dL — ABNORMAL HIGH (ref 8–23)
CO2: 30 mmol/L (ref 22–32)
Calcium: 9.5 mg/dL (ref 8.9–10.3)
Chloride: 97 mmol/L — ABNORMAL LOW (ref 98–111)
Creatinine, Ser: 1.17 mg/dL (ref 0.61–1.24)
GFR, Estimated: >60 mL/min (ref >60)
Glucose, Bld: 211 mg/dL — ABNORMAL HIGH (ref 70–99)
Potassium: 3.9 mmol/L (ref 3.5–5.1)
Sodium: 137 mmol/L (ref 135–145)

## 2021-03-21 LAB — CBC
HCT: 44.9 % (ref 39.0–52.0)
Hemoglobin: 14.4 g/dL (ref 13.0–17.0)
MCH: 28.5 pg (ref 26.0–34.0)
MCHC: 32.1 g/dL (ref 30.0–36.0)
MCV: 88.7 fL (ref 80.0–100.0)
Platelets: 327 10*3/uL (ref 150–400)
RBC: 5.06 MIL/uL (ref 4.22–5.81)
RDW: 14.2 % (ref 11.5–15.5)
WBC: 13.5 10*3/uL — ABNORMAL HIGH (ref 4.0–10.5)
nRBC: 0 % (ref 0.0–0.2)

## 2021-03-21 LAB — MAGNESIUM: Magnesium: 2.3 mg/dL (ref 1.7–2.4)

## 2021-03-21 MED ORDER — FERROUS SULFATE 325 (65 FE) MG PO TABS
325.0000 mg | ORAL_TABLET | Freq: Every day | ORAL | Status: DC
  Administered 2021-03-22: 325 mg via ORAL
  Filled 2021-03-21: qty 1

## 2021-03-21 MED ORDER — PAROXETINE HCL 20 MG PO TABS
40.0000 mg | ORAL_TABLET | Freq: Every day | ORAL | Status: DC
  Administered 2021-03-21 – 2021-03-22 (×2): 40 mg via ORAL
  Filled 2021-03-21 (×2): qty 2

## 2021-03-21 MED ORDER — FUROSEMIDE 10 MG/ML IJ SOLN
40.0000 mg | Freq: Two times a day (BID) | INTRAMUSCULAR | Status: DC
  Administered 2021-03-21 – 2021-03-22 (×3): 40 mg via INTRAVENOUS
  Filled 2021-03-21 (×3): qty 4

## 2021-03-21 MED ORDER — PREDNISONE 20 MG PO TABS
40.0000 mg | ORAL_TABLET | Freq: Every day | ORAL | Status: DC
Start: 2021-03-22 — End: 2021-03-22
  Administered 2021-03-22: 40 mg via ORAL
  Filled 2021-03-21: qty 2

## 2021-03-21 MED ORDER — MELOXICAM 7.5 MG PO TABS
7.5000 mg | ORAL_TABLET | Freq: Every day | ORAL | Status: DC | PRN
  Filled 2021-03-21: qty 1

## 2021-03-21 MED ORDER — TRAZODONE HCL 50 MG PO TABS
50.0000 mg | ORAL_TABLET | Freq: Every evening | ORAL | Status: DC | PRN
  Administered 2021-03-21: 50 mg via ORAL
  Filled 2021-03-21: qty 1

## 2021-03-21 NOTE — Progress Notes (Signed)
?  Progress Note ? ? ?Patient: Louis Gutierrez QPR:[id number] DOB: 1950/02/18 DOA: 03/19/2021     2 ?DOS: the patient was seen and examined on 03/21/2021 ?  ?Brief hospital course: ?No notes on file ? ?Assessment and Plan: ?* Acute on chronic combined systolic and diastolic CHF (congestive heart failure) ([place]) ?Last echocardiogram on file 10/09/2016, indication was CVA, EF 50 to 55% ?--current Echo LVEF 35-40% with grade I DD ?--cont IV lasix 40 mg BID ? ?COPD exacerbation ([place]) ?--transition from IV solumedrol to prednisone 40 mg daily tomorrow ?--cont DuoNeb scheduled ? ?Atherosclerotic cerebrovascular disease w/ Hx CVA 2018 ?Continue ASA/Plavix, statin ? ?COPD (chronic obstructive pulmonary disease) ([place]) ? ? ? ?Tobacco abuse ?nicoderm prn  ? ?CAD (coronary artery disease) ?Cont home ASA and statin ? ?OSA (obstructive sleep apnea) ?Has not had sleep study for a long time, so can't order him CPAP for home, per TOC ? ? ? ? ? ? ? ?Subjective:  ?Pt reported breathing improved and still putting out good amount of urine.  But still needs 2L O2 with walking. ? ? ?Physical Exam: ?Vitals:  ? 03/21/21 1344 03/21/21 1837 03/21/21 1931 03/21/21 1957  ?BP:  106/75 117/68   ?Pulse:  92 80 81  ?Resp:  17 18 18   ?Temp:  97.7 ?F (36.5 ?C) 97.7 ?F (36.5 ?C)   ?TempSrc:  Oral    ?SpO2: 93% 96% 97% 93%  ?Weight:      ?Height:      ? ? ?Constitutional: NAD, AAOx3 ?HEENT: conjunctivae and lids normal, EOMI ?CV: No cyanosis.   ?RESP: reduced lung sounds, no crackles, on 2L ?Extremities: No effusions, edema in BLE ?SKIN: warm, dry ?Neuro: II - XII grossly intact.   ?Psych: Normal mood and affect.  Appropriate judgement and reason ? ? ?Data Reviewed: ? ?Family Communication:  ? ?Disposition: ?Status is: Inpatient ?Remains inpatient appropriate because: on 2L O2 new, IV lasix ? ? ? Planned Discharge Destination: Home ? ? ? ? ?Time spent: 35 minutes ? ?Author: ?Enzo Bi, MD ?03/21/2021 8:45 PM ? ?For on call review [email or web address]  ? ?

## 2021-03-21 NOTE — Progress Notes (Signed)
SATURATION QUALIFICATIONS: (This note is used to comply with regulatory documentation for home oxygen) ? ?Patient Saturations on Room Air at Rest = 89% ? ?Patient Saturations on Room Air while Ambulating = 86% ? ?Patient Saturations on 2 Liters of oxygen while Ambulating = 91% ? ?Please briefly explain why patient needs home oxygen:patient unable to maintain oxygen saturations >88% on room air with activity.  ?

## 2021-03-22 LAB — BASIC METABOLIC PANEL
Anion gap: 8 (ref 5–15)
BUN: 44 mg/dL — ABNORMAL HIGH (ref 8–23)
CO2: 34 mmol/L — ABNORMAL HIGH (ref 22–32)
Calcium: 9.2 mg/dL (ref 8.9–10.3)
Chloride: 97 mmol/L — ABNORMAL LOW (ref 98–111)
Creatinine, Ser: 1 mg/dL (ref 0.61–1.24)
GFR, Estimated: >60 mL/min (ref >60)
Glucose, Bld: 137 mg/dL — ABNORMAL HIGH (ref 70–99)
Potassium: 3.7 mmol/L (ref 3.5–5.1)
Sodium: 139 mmol/L (ref 135–145)

## 2021-03-22 LAB — CBC
HCT: 45.6 % (ref 39.0–52.0)
Hemoglobin: 14.6 g/dL (ref 13.0–17.0)
MCH: 28.4 pg (ref 26.0–34.0)
MCHC: 32 g/dL (ref 30.0–36.0)
MCV: 88.7 fL (ref 80.0–100.0)
Platelets: 306 10*3/uL (ref 150–400)
RBC: 5.14 MIL/uL (ref 4.22–5.81)
RDW: 14.1 % (ref 11.5–15.5)
WBC: 11.7 10*3/uL — ABNORMAL HIGH (ref 4.0–10.5)
nRBC: 0 % (ref 0.0–0.2)

## 2021-03-22 LAB — MAGNESIUM: Magnesium: 2.5 mg/dL — ABNORMAL HIGH (ref 1.7–2.4)

## 2021-03-22 MED ORDER — PREDNISONE 20 MG PO TABS: ORAL_TABLET | ORAL | 0 refills | Status: DC

## 2021-03-22 MED ORDER — FUROSEMIDE 40 MG PO TABS: 40.0000 mg | ORAL_TABLET | Freq: Every day | ORAL | 0 refills | Status: DC

## 2021-03-22 NOTE — Discharge Summary (Signed)
Physician Discharge Summary   MAT STUARD  male DOB: Aug 21, 1950  DQQ:[id number]  PCP: Kirk Ruths, MD  Admit date: 03/19/2021 Discharge date: 03/22/2021  Admitted From: home Disposition:  home CODE STATUS: Full code  Discharge Instructions     Discharge instructions   Complete by: As directed    You have been treated for both congestive heart failure and COPD flare ups.  You still need 2 liters of supplemental oxygen prior to discharge, so you are discharged with home oxygen.  Please follow up with your outpatient provider to see when you can get off of oxygen.  Your congestive heart failure has worsened.  I have prescribed you Lasix 40 mg daily for 1 month.  Please follow up with your outpatient cardiologist for further refill if indicated.  Please finish prednisone taper at home for your COPD.  Please follow up with PCP or establish with pulmonologist to consider daily COPD inhalers.  For now, please use your home Combivent.   Dr. Enzo Bi [facility] Course:  For full details, please see H&P, progress notes, consult notes and ancillary notes.  Briefly,  Louis Gutierrez is a 71 y.o. male with hx of COPD, HTN, CAD who presented to emergency department with chief complaint of chest discomfort and shortness of breath, presented from outpatient clinic where his SaO2 was 88% on room air.    Acute hypoxemic respiratory failure --appeared to be contributed by both CHF and COPD exacerbation.  Pt's respiratory symptoms and dyspnea improved with treatment for both, and pt actually reported feeling back to baseline, however, with walking, pt desat quickly down to low 80's and needed 2L supplemental oxygen.  Pt was therefore discharged with 2L home O2 to be weaned off by his outpatient provider.  * Acute on chronic combined systolic and diastolic CHF (congestive heart failure) ([place]) Last echocardiogram on file 10/09/2016, indication was CVA, EF 50 to  55%. --current Echo LVEF 35-40% with grade I DD. --Pt received IV lasix 40 mg BID with good urine output.   --Pt wasn't on diuretic PTA, and was discharged on oral lasix 40 mg daily for 1 month, to be followed up by outpatient cardiology or PCP.   COPD exacerbation ([place]) --Pt received IV solumedrol on presentation and transitioned to to prednisone 40 mg daily.  Received DuoNeb scheduled.  Pt was discharged on prednisone taper.   --Pt was only on Combivent PRN PTA, which was continued.     Atherosclerotic cerebrovascular disease w/ Hx CVA 2018 Continue ASA/Plavix, statin   Tobacco abuse   CAD (coronary artery disease) Cont home ASA and statin   OSA (obstructive sleep apnea) Has not had sleep study for a long time, so can't order him CPAP for home, per TOC  HTN --cont home ramipril  Depression --cont home Paxil   Discharge Diagnoses:  Principal Problem:   Acute on chronic combined systolic and diastolic CHF (congestive heart failure) (HCC) Active Problems:   OSA (obstructive sleep apnea)   CAD (coronary artery disease)   Tobacco abuse   COPD (chronic obstructive pulmonary disease) (HCC)   Atherosclerotic cerebrovascular disease w/ Hx CVA 2018   COPD exacerbation (HCC)   CHF (congestive heart failure) ([place])   30 Day Unplanned Readmission Risk Score    Flowsheet Row ED to Hosp-Admission (Current) from 03/19/2021 in [facility]  30 Day Unplanned Readmission Risk Score (%) 9.2 Filed at 03/22/2021 0401  This score is the patient's risk of an unplanned readmission within 30 days of being discharged (0 -100%). The score is based on dignosis, age, lab data, medications, orders, and past utilization.   Low:  0-14.9   Medium: 15-21.9   High: 22-29.9   Extreme: 30 and above         Discharge Instructions:  Allergies as of 03/22/2021       Reactions   Penicillins Anaphylaxis, Hives, Nausea And Vomiting   As a child Has patient had a PCN reaction  causing immediate rash, facial/tongue/throat swelling, SOB or lightheadedness with hypotension: No Has patient had a PCN reaction causing severe rash involving mucus membranes or skin necrosis: No Has patient had a PCN reaction that required hospitalization No Has patient had a PCN reaction occurring within the last 10 years: No If all of the above answers are "NO", then may proceed with Cephalosporin use.        Medication List     STOP taking these medications    traMADol 50 MG tablet Commonly known as: ULTRAM       TAKE these medications    acetaminophen 500 MG tablet Commonly known as: TYLENOL Take 1,000 mg by mouth every 8 (eight) hours as needed for mild pain.   aspirin 81 MG chewable tablet Chew 81 mg by mouth daily.   clopidogrel 75 MG tablet Commonly known as: PLAVIX TAKE 1 TABLET(75 MG) BY MOUTH DAILY   Combivent Respimat 20-100 MCG/ACT Aers respimat Generic drug: Ipratropium-Albuterol Inhale 1 puff into the lungs every 6 (six) hours as needed for wheezing.   ferrous sulfate 325 (65 FE) MG EC tablet Take 1 tablet (325 mg total) by mouth in the morning and at bedtime.   furosemide 40 MG tablet Commonly known as: Lasix Take 1 tablet (40 mg total) by mouth daily.   meloxicam 7.5 MG tablet Commonly known as: MOBIC TAKE 1 TABLET(7.5 MG) BY MOUTH EVERY DAY AS NEEDED FOR PAIN   pantoprazole 40 MG tablet Commonly known as: PROTONIX Take 40 mg by mouth daily.   PARoxetine 40 MG tablet Commonly known as: PAXIL Take 40 mg by mouth daily.   pravastatin 40 MG tablet Commonly known as: PRAVACHOL TAKE 1 TABLET(40 MG) BY MOUTH DAILY   predniSONE 20 MG tablet Commonly known as: DELTASONE Take 40 mg (2 tabs) from 3/4 to 3/6, then 20 mg (1 tab) from 3/7 to 3/9, then 10 mg (1/2 tab) from 3/10 to 3/12, then done.   ramipril 10 MG capsule Commonly known as: ALTACE TAKE 1 CAPSULE(10 MG) BY MOUTH DAILY   traZODone 100 MG tablet Commonly known as: DESYREL Take  50 mg by mouth at bedtime as needed for sleep.               Durable Medical Equipment  (From admission, onward)           Start     Ordered   03/21/21 1225  For home use only DME oxygen  Once       Question Answer Comment  Length of Need 6 Months   Mode or (Route) Nasal cannula   Liters per Minute 2   Frequency Continuous (stationary and portable oxygen unit needed)   Oxygen delivery system Gas      03/21/21 1224             Follow-up Information     Kirk Ruths, MD Follow up in 1 week(s).   Specialty: Internal Medicine Contact  information: [place] [address] [phone]         Croitoru, Dani Gobble, MD Follow up in 2 week(s).   Specialty: Cardiology Contact information: [address] [place] [address] [phone]                 Allergies  Allergen Reactions   Penicillins Anaphylaxis, Hives and Nausea And Vomiting    As a child Has patient had a PCN reaction causing immediate rash, facial/tongue/throat swelling, SOB or lightheadedness with hypotension: No Has patient had a PCN reaction causing severe rash involving mucus membranes or skin necrosis: No Has patient had a PCN reaction that required hospitalization No Has patient had a PCN reaction occurring within the last 10 years: No If all of the above answers are "NO", then may proceed with Cephalosporin use.      The results of significant diagnostics from this hospitalization (including imaging, microbiology, ancillary and laboratory) are listed below for reference.   Consultations:   Procedures/Studies: DG Chest 2 View  Result Date: 03/19/2021 CLINICAL DATA:  Chest pain EXAM: CHEST - 2 VIEW COMPARISON:  05/23/2020 FINDINGS: Transverse diameter of heart is increased. There are linear patchy infiltrates in both lower lung fields with possible interval worsening in the right lower lung fields. Low position of diaphragms suggests COPD.  There is interval clearing of subcutaneous emphysema in the left chest wall. There is no pneumothorax. IMPRESSION: COPD. There are linear densities in the lower lung fields with interval worsening in the right lower lung fields. Findings suggest scarring and possibly subsegmental atelectasis/pneumonia. Cardiomegaly. There are no signs of alveolar pulmonary edema. Electronically Signed   By: Elmer Picker M.D.   On: 03/19/2021 12:29   CT Angio Chest PE W and/or Wo Contrast  Result Date: 03/19/2021 CLINICAL DATA:  Substernal chest pain and shortness of breath for the past week. EXAM: CT ANGIOGRAPHY CHEST WITH CONTRAST TECHNIQUE: Multidetector CT imaging of the chest was performed using the standard protocol during bolus administration of intravenous contrast. Multiplanar CT image reconstructions and MIPs were obtained to evaluate the vascular anatomy. RADIATION DOSE REDUCTION: This exam was performed according to the departmental dose-optimization program which includes automated exposure control, adjustment of the mA and/or kV according to patient size and/or use of iterative reconstruction technique. CONTRAST:  71mL OMNIPAQUE IOHEXOL 350 MG/ML SOLN COMPARISON:  Chest x-ray from same day. CT chest dated August 06, 2020. FINDINGS: Cardiovascular: Satisfactory opacification of the pulmonary arteries to the segmental level. No evidence of pulmonary embolism. Unchanged cardiomegaly. No pericardial effusion. No thoracic aortic aneurysm. Coronary, aortic arch, and branch vessel atherosclerotic vascular disease. Reflux of contrast into the IVC and hepatic veins. Mediastinum/Nodes: New mildly enlarged subcarinal and right hilar lymph nodes measuring up to 1.3 cm in short axis. No enlarged axillary lymph nodes. The thyroid gland, trachea, and esophagus demonstrate no significant findings. Lungs/Pleura: Similar moderate to severe centrilobular emphysema with prominent central peribronchial thickening. New small right  and trace left pleural effusions. New scattered smooth interlobular septal thickening. Chronic smooth bilateral pleural thickening. Chronic pleuroparenchymal scarring in both lungs. New dependent atelectasis in the posterior right lower lobe. No focal consolidation or pneumothorax. Upper Abdomen: No acute abnormality. Unchanged 4.1 cm left adrenal adenoma. Musculoskeletal: No chest wall abnormality. No acute or significant osseous findings. Review of the MIP images confirms the above findings. IMPRESSION: 1. No evidence of pulmonary embolism. 2. New mild congestive heart failure. Evidence of right heart dysfunction. 3. New mildly enlarged subcarinal  and right hilar lymph nodes, nonspecific, but likely reactive. 4. Unchanged 4.1 cm left adrenal adenoma. 5. Aortic Atherosclerosis (ICD10-I70.0) and Emphysema (ICD10-J43.9). Electronically Signed   By: Titus Dubin M.D.   On: 03/19/2021 14:35   ECHOCARDIOGRAM COMPLETE  Result Date: 03/20/2021    ECHOCARDIOGRAM REPORT   Patient Name:   Louis Gutierrez Date of Exam: 03/20/2021 Medical Rec #:  [id number]      Height:       71.0 in Accession #:    [id number]     Weight:       192.9 lb Date of Birth:  Oct 19, 1950     BSA:          2.076 m Patient Age:    34 years       BP:           116/72 mmHg Patient Gender: M              HR:           86 bpm. Exam Location:  [facility] Procedure: 2D Echo, Color Doppler and Cardiac Doppler Indications:     I50.21 congestive heart failure-Acute Systolic  History:         Patient has prior history of Echocardiogram examinations. CAD,                  COPD and Stroke; Risk Factors:Hypertension, Diabetes,                  Dyslipidemia and Current Smoker.  Sonographer:     Charmayne Sheer Referring Phys:  [id number] Emeterio Reeve Diagnosing Phys: Kate Sable MD  Sonographer Comments: Suboptimal apical window and no subcostal window. Image acquisition challenging due to COPD. IMPRESSIONS  1. Left ventricular ejection fraction, by estimation, is 35  to 40%. The left ventricle has moderately decreased function. The left ventricle demonstrates regional wall motion abnormalities (see scoring diagram/findings for description). Left ventricular  diastolic parameters are consistent with Grade I diastolic dysfunction (impaired relaxation). There is LV global hypokinesis with inferior and inferolateral wall akinesis.  2. Right ventricular systolic function is normal. The right ventricular size is normal.  3. The mitral valve is normal in structure. Mild mitral valve regurgitation.  4. The aortic valve is tricuspid. Aortic valve regurgitation is not visualized. Aortic valve sclerosis is present, with no evidence of aortic valve stenosis.  5. Aortic dilatation noted. There is mild dilatation of the aortic root, measuring 40 mm.  6. The inferior vena cava is normal in size with <50% respiratory variability, suggesting right atrial pressure of 8 mmHg. FINDINGS  Left Ventricle: Left ventricular ejection fraction, by estimation, is 35 to 40%. The left ventricle has moderately decreased function. The left ventricle demonstrates regional wall motion abnormalities. The left ventricular internal cavity size was normal in size. There is no left ventricular hypertrophy. Left ventricular diastolic parameters are consistent with Grade I diastolic dysfunction (impaired relaxation).  LV Wall Scoring: There is LV global hypokinesis with inferior and inferolateral wall akinesis. Right Ventricle: The right ventricular size is normal. No increase in right ventricular wall thickness. Right ventricular systolic function is normal. Left Atrium: Left atrial size was normal in size. Right Atrium: Right atrial size was normal in size. Pericardium: There is no evidence of pericardial effusion. Mitral Valve: The mitral valve is normal in structure. Mild mitral valve regurgitation. MV peak gradient, 5.2 mmHg. The mean mitral valve gradient is 2.0 mmHg. Tricuspid Valve: The tricuspid valve is  normal in structure.  Tricuspid valve regurgitation is not demonstrated. Aortic Valve: The aortic valve is tricuspid. Aortic valve regurgitation is not visualized. Aortic valve sclerosis is present, with no evidence of aortic valve stenosis. Aortic valve mean gradient measures 5.0 mmHg. Aortic valve peak gradient measures 8.9  mmHg. Aortic valve area, by VTI measures 2.91 cm. Pulmonic Valve: The pulmonic valve was normal in structure. Pulmonic valve regurgitation is not visualized. Aorta: Aortic dilatation noted. There is mild dilatation of the aortic root, measuring 40 mm. Venous: The inferior vena cava is normal in size with less than 50% respiratory variability, suggesting right atrial pressure of 8 mmHg. IAS/Shunts: No atrial level shunt detected by color flow Doppler.  LEFT VENTRICLE PLAX 2D LVIDd:         6.69 cm   Diastology LVIDs:         5.73 cm   LV e' medial:    3.26 cm/s LV PW:         0.95 cm   LV E/e' medial:  24.8 LV IVS:        0.86 cm   LV e' lateral:   7.18 cm/s LVOT diam:     2.10 cm   LV E/e' lateral: 11.3 LV SV:         62 LV SV Index:   30 LVOT Area:     3.46 cm  RIGHT VENTRICLE RV Basal diam:  3.23 cm LEFT ATRIUM             Index        RIGHT ATRIUM           Index LA diam:        4.20 cm 2.02 cm/m   RA Area:     12.50 cm LA Vol (A2C):   52.7 ml 25.38 ml/m  RA Volume:   30.30 ml  14.59 ml/m LA Vol (A4C):   41.9 ml 20.18 ml/m LA Biplane Vol: 47.9 ml 23.07 ml/m  AORTIC VALVE                    PULMONIC VALVE AV Area (Vmax):    2.53 cm     PV Vmax:       0.96 m/s AV Area (Vmean):   2.57 cm     PV Vmean:      64.600 cm/s AV Area (VTI):     2.91 cm     PV VTI:        0.142 m AV Vmax:           149.00 cm/s  PV Peak grad:  3.7 mmHg AV Vmean:          98.900 cm/s  PV Mean grad:  2.0 mmHg AV VTI:            0.213 m AV Peak Grad:      8.9 mmHg AV Mean Grad:      5.0 mmHg LVOT Vmax:         109.00 cm/s LVOT Vmean:        73.500 cm/s LVOT VTI:          0.179 m LVOT/AV VTI ratio: 0.84  AORTA Ao  Root diam: 4.00 cm MITRAL VALVE MV Area (PHT): 8.43 cm    SHUNTS MV Area VTI:   2.56 cm    Systemic VTI:  0.18 m MV Peak grad:  5.2 mmHg    Systemic Diam: 2.10 cm MV Mean grad:  2.0 mmHg MV Vmax:  1.14 m/s MV Vmean:      71.8 cm/s MV Decel Time: 90 msec MV E velocity: 81.00 cm/s MV A velocity: 99.40 cm/s MV E/A ratio:  0.81 Kate Sable MD Electronically signed by Kate Sable MD Signature Date/Time: 03/20/2021/1:09:41 PM    Final       Labs: BNP (last 3 results) Recent Labs    05/23/20 1942 03/19/21 1202  BNP 92.2 836.6*   Basic Metabolic Panel: Recent Labs  Lab 03/19/21 1154 03/20/21 0544 03/21/21 0549 03/22/21 0630  NA 138 137 137 139  K 4.6 3.7 3.9 3.7  CL 101 96* 97* 97*  CO2 32 28 30 34*  GLUCOSE 103* 268* 211* 137*  BUN 20 30* 39* 44*  CREATININE 1.00 1.17 1.17 1.00  CALCIUM 9.5 9.7 9.5 9.2  MG  --   --  2.3 2.5*   Liver Function Tests: No results for input(s): AST, ALT, ALKPHOS, BILITOT, PROT, ALBUMIN in the last 168 hours. No results for input(s): LIPASE, AMYLASE in the last 168 hours. No results for input(s): AMMONIA in the last 168 hours. CBC: Recent Labs  Lab 03/19/21 1154 03/20/21 0544 03/21/21 0549 03/22/21 0630  WBC 6.2 7.4 13.5* 11.7*  HGB 13.7 14.3 14.4 14.6  HCT 43.5 44.6 44.9 45.6  MCV 90.2 87.5 88.7 88.7  PLT 273 320 327 306   Cardiac Enzymes: No results for input(s): CKTOTAL, CKMB, CKMBINDEX, TROPONINI in the last 168 hours. BNP: Invalid input(s): POCBNP CBG: No results for input(s): GLUCAP in the last 168 hours. D-Dimer No results for input(s): DDIMER in the last 72 hours. Hgb A1c No results for input(s): HGBA1C in the last 72 hours. Lipid Profile No results for input(s): CHOL, HDL, LDLCALC, TRIG, CHOLHDL, LDLDIRECT in the last 72 hours. Thyroid function studies No results for input(s): TSH, T4TOTAL, T3FREE, THYROIDAB in the last 72 hours.  Invalid input(s): FREET3 Anemia work up No results for input(s): VITAMINB12,  FOLATE, FERRITIN, TIBC, IRON, RETICCTPCT in the last 72 hours. Urinalysis    Component Value Date/Time   COLORURINE Straw 12/05/2013 1845   APPEARANCEUR Clear 10/16/2015 1104   LABSPEC 1.006 12/05/2013 1845   PHURINE 6.0 12/05/2013 1845   GLUCOSEU Negative 10/16/2015 1104   GLUCOSEU Negative 12/05/2013 1845   HGBUR 1+ 12/05/2013 1845   BILIRUBINUR Negative 10/16/2015 1104   BILIRUBINUR Negative 12/05/2013 1845   KETONESUR Negative 12/05/2013 1845   PROTEINUR Negative 10/16/2015 1104   PROTEINUR Negative 12/05/2013 1845   NITRITE Negative 10/16/2015 1104   NITRITE Negative 12/05/2013 1845   LEUKOCYTESUR Negative 10/16/2015 1104   LEUKOCYTESUR Negative 12/05/2013 1845   Sepsis Labs Invalid input(s): PROCALCITONIN,  WBC,  LACTICIDVEN Microbiology Recent Results (from the past 240 hour(s))  Blood culture (routine x 2)     Status: None (Preliminary result)   Collection Time: 03/19/21  3:07 PM   Specimen: BLOOD  Result Value Ref Range Status   Specimen Description BLOOD RIGHT ANTECUBITAL  Final   Special Requests   Final    BOTTLES DRAWN AEROBIC AND ANAEROBIC Blood Culture results may not be optimal due to an excessive volume of blood received in culture bottles   Culture   Final    NO GROWTH 3 DAYS Performed at [facility], [address] [place], [place] [address]    Report Status PENDING  Incomplete  Blood culture (routine x 2)     Status: None (Preliminary result)   Collection Time: 03/19/21  3:07 PM   Specimen: BLOOD  Result Value  Ref Range Status   Specimen Description BLOOD LEFT ANTECUBITAL  Final   Special Requests   Final    BOTTLES DRAWN AEROBIC AND ANAEROBIC Blood Culture adequate volume   Culture   Final    NO GROWTH 3 DAYS Performed at [facility], [address] [place], [place] [address]    Report Status PENDING  Incomplete     Total time spend on discharging this patient, including the last patient exam, discussing the hospital  stay, instructions for ongoing care as it relates to all pertinent caregivers, as well as preparing the medical discharge records, prescriptions, and/or referrals as applicable, is 35 minutes.    Enzo Bi, MD  [facility] 03/22/2021, 8:06 AM

## 2021-03-22 NOTE — TOC Transition Note (Signed)
Transition of Care (TOC) - CM/SW Discharge Note ? ? ?Patient Details  ?Name: Louis Gutierrez ?MRN: [id number] ?Date of Birth: 02/18/1950 ? ?Transition of Care (TOC) CM/SW Contact:  ?Candie Chroman, LCSW ?Phone Number: ?03/22/2021, 8:41 AM ? ? ?Clinical Narrative:   Patient has orders to discharge home today. Ordered oxygen through [employer]. No further concerns. CSW signing off. ? ?Final next level of care: Home/Self Care ?Barriers to Discharge: Barriers Resolved ? ? ?Patient Goals and CMS Choice ?  ?  ?  ? ?Discharge Placement ?  ?           ?  ?Patient to be transferred to facility by: Wife or kids ?  ?Patient and family notified of of transfer: 03/22/21 ? ?Discharge Plan and Services ?  ?  ?Post Acute Care Choice: NA          ?DME Arranged: Oxygen ?DME Agency: [employer] ?Date DME Agency Contacted: 03/22/21 ?  ?Representative spoke with at DME Agency: Stephannie Peters ?  ?  ?  ?  ?  ? ?Social Determinants of Health (SDOH) Interventions ?  ? ? ?Readmission Risk Interventions ?No flowsheet data found. ? ? ? ? ?

## 2021-03-22 NOTE — Progress Notes (Signed)
? ?  Heart Failure Nurse Navigator Note ? ? ?Met with patient today he tells me that he is being discharged home. ? ?I teach back method went over changes in weight and signs and symptoms to report.  No reinforcement needed. ? ?He states that his wife got the email about registration form with [facility]. ? ?Pricilla Riffle RN [facility] ?

## 2021-03-24 LAB — CULTURE, BLOOD (ROUTINE X 2)
Culture: NO GROWTH
Culture: NO GROWTH
Special Requests: ADEQUATE

## 2021-03-25 ENCOUNTER — Other Ambulatory Visit: Payer: Self-pay

## 2021-03-25 ENCOUNTER — Other Ambulatory Visit (HOSPITAL_COMMUNITY): Payer: Self-pay

## 2021-03-25 NOTE — Patient Outreach (Signed)
[facility]) Care Management ? ?03/25/2021 ? ?Louis Gutierrez ?Jan 23, 1950 ?[id number] ? ? ?Telephone Assessment ? ? ? ?Successful outreach call placed to patient. He is pleased to report how well he is doing and feeling since recent admission. He reports breathing is good and back to baseline. He has oxygen in the home for prn usage. He has MD's follow up appt. Patient remains very active and independent as well as he has supportive spouse in the home to assist as needed. Denies any RN CM needs or concerns at this time.  ? ? ?Medications Reviewed Today   ? ? Reviewed by Hayden Pedro, RN (Registered Nurse) on 03/25/21 at 1024  Med List Status: <None>  ? ?Medication Order Taking? Sig Documenting Provider Last Dose Status Informant  ?acetaminophen (TYLENOL) 500 MG tablet [id number] No Take 1,000 mg by mouth every 8 (eight) hours as needed for mild pain.  [provider] 03/21/2021 Active Self  ?aspirin 81 MG chewable tablet [id number] No Chew 81 mg by mouth daily. [provider] 03/21/2021 Active Self  ?clopidogrel (PLAVIX) 75 MG tablet [id number] No TAKE 1 TABLET(75 MG) BY MOUTH DAILY Croitoru, Mihai, MD 03/21/2021 Active   ?COMBIVENT RESPIMAT 20-100 MCG/ACT AERS respimat [id number] No Inhale 1 puff into the lungs every 6 (six) hours as needed for wheezing. [provider] 03/21/2021 Active Self  ?ferrous sulfate 325 (65 FE) MG EC tablet [id number] No Take 1 tablet (325 mg total) by mouth in the morning and at bedtime. Croitoru, Mihai, MD 03/19/2021 Active   ?furosemide (LASIX) 40 MG tablet [id number]  Take 1 tablet (40 mg total) by mouth daily. Enzo Bi, MD  Active   ?meloxicam (MOBIC) 7.5 MG tablet [id number]  TAKE 1 TABLET(7.5 MG) BY MOUTH EVERY DAY AS NEEDED FOR PAIN [provider]  Active   ?pantoprazole (PROTONIX) 40 MG tablet [id number] No Take 40 mg by mouth daily. [provider] 03/21/2021 Active Self  ?PARoxetine (PAXIL) 40 MG tablet [id number] No Take  40 mg by mouth daily.  [provider] 03/19/2021 Active Self  ?         ?Med Note Fabio Bering   Tue Oct 07, 2016  6:39 PM)    ?pravastatin (PRAVACHOL) 40 MG tablet [id number] No TAKE 1 TABLET(40 MG) BY MOUTH DAILY Croitoru, Mihai, MD 03/21/2021 Active   ?predniSONE (DELTASONE) 20 MG tablet [id number]  Take 40 mg (2 tabs) from 3/4 to 3/6, then 20 mg (1 tab) from 3/7 to 3/9, then 10 mg (1/2 tab) from 3/10 to 3/12, then done. Enzo Bi, MD  Active   ?ramipril (ALTACE) 10 MG capsule [id number] No TAKE 1 CAPSULE(10 MG) BY MOUTH DAILY Croitoru, Mihai, MD 03/21/2021 Active   ?traZODone (DESYREL) 100 MG tablet [id number] No Take 50 mg by mouth at bedtime as needed for sleep.  [provider] 03/19/2021 Active Self  ?         ?Med Note Fabio Bering   Tue Oct 07, 2016  6:39 PM)    ? ?  ?  ? ?  ?  ? ?Care Plan : RN Care Manager POC  ?Updates made by Hayden Pedro, RN since 03/25/2021 12:00 AM  ?  ? ?Problem: Chronic Disease Mgmt of Chronic Conditions(COPD,DM)   ?Priority: High  ?  ? ?Long-Range Goal: Development of POC for Mgmt of Chronic Conditions-COPD,DM   ?Start Date: 11/21/2020  ?Expected End Date: 11/21/2021  ?This Visit's Progress: On track  ?Recent Progress: On track  ?  Priority: High  ?Note:   ? ?Current Barriers:  ?Chronic Disease Management support and education needs related to COPD and DMII ? ?RNCM Clinical Goal(s):  ?Patient will verbalize understanding of plan for management of COPD and DMII ?verbalize basic understanding of  COPD and DMII disease process and self health management plan for chronic conditions ?take all medications exactly as prescribed and will call provider for medication related questions ?demonstrate Ongoing adherence to prescribed treatment plan for COPD and DMII as evidenced by lowering of A1C level and no minimal COPD exacerbations  through collaboration with RN Care manager, provider, and care team.  ? ?Interventions: ?POC sent to PCP upon initial  assessment, quarterly and with any changes in patient's conditions ?Inter-disciplinary care team collaboration (see longitudinal plan of care) ?Evaluation of current treatment plan related to  self management and patient's adherence to plan as established by provider ? ? ?Diabetes Interventions:  (Status:  Condition stable.  Not addressed this visit.) Long Term Goal ?Assessed patient's understanding of A1c goal: <7% ? ?Lab Results  ?Component Value Date  ? HGBA1C 6.4 (H) 12/22/2019  ?  ? ?Diabetes Interventions:  ?Patient reports an intentional wgt loss of about 12 lbs within the past 6 months or more as he verbalizes wgt loss with positively affect his overall health in a good way. ?Assessed patient's understanding of A1c goal: <7% ?Provided education to patient about basic DM disease process; ?Reviewed medications with patient and discussed importance of medication adherence; ?Praised patient for lowering of A1C level based on most recent results (previous A1C 6.5-April 2022) ?Lab Results  ?Component Value Date  ? HGBA1C 6.4 (H) 12/22/2019  ?COPD Interventions:  (Status:  Goal on track:  Yes.) Long Term Goal ? ? ?COPD Interventions:  Patient reports he has noticed a slight change in his breathing pattern since weather/season has changed. He is using inhalers as ordered with good relief and avoiding triggers. ?Advised patient to track and manage COPD triggers;  ?Advised patient to self assesses COPD action plan zone and make appointment with provider if in the yellow zone for 48 hours without improvement; ?Assessed for vaccination/immunization status -patient received flu shot at PCP appt-11/01/20 ?02/21/21-Patient reports that his breathing has been fair. Weather does trigger some issues at times but sxs managed. ?3/6/2-Patient has recent hospital admission for COPD exacerbation. However, he reports since returning home his sxs have improved and he is doing great. He has meds and oxygen in the home(only using prn).  Denies any resp issues at present.  ? ?Patient Goals/Self-Care Activities: ?Patient will self administer medications as prescribed ?Patient will attend all scheduled provider appointments ?Patient will call provider office for new concerns or questions ?Patient will continue to monitor cbgs in the home and adhere to diabetic diet ?Patient will have no exacerbation of sxs and unplanned hospital admission over the next 90 days ? ? ? ?Follow Up Plan:  Telephone follow up appointment with care management team member scheduled for:  quarterly-in the month of May ?The patient has been provided with contact information for the care management team and has been advised to call with any health related questions or concerns.   ?  ?  ?Plan: ? ?RN CM discussed with patient next outreach within the month of May. Patient agrees to care plan and follow up. ? ?Enzo Montgomery, RN,BSN,CCM ?[facility] Care Management ?Telephonic Care Management Coordinator ?Direct Phone: [phone] ?Toll Free: [phone] ?Fax: [phone] ? ?

## 2021-03-27 NOTE — Progress Notes (Signed)
Patient: Louis Kirsh "Gene"  DOB: Sep 14, 2050  MRN: [id number]  Algie Westry is a 71 yo male patient with a PMHx of: coronary artery disease, cryptogenic stroke, HTN, DM, tobacco abuse, OSA, HF and COPD.    He presented to his PCP office on 03/19/21 with chest pain, sent to the ED, admitted 2/23-03/22/21 with COPD and HF exacerbation. Subsequently diuresed, treated with steroids and d/c'd home on supplemental O2.   Echo report from 03/20/21 reviewed and showed: Left ventricular ejection fraction, by estimation, is 35 to 40%. The left ventricle has moderately decreased function. The left ventricle demonstrates regional wall motion abnormalities. The left ventricular internal cavity size was normal in size. There is no left ventricular hypertrophy. Left ventricular diastolic parameters are consistent with Grade I diastolic dysfunction (impaired relaxation).   He presents today for his initial visit to the heart failure clinic with a chief complaint of improving SOB and fatigue since hospital discharge. He also endorses occasional SOB, cough, and difficulty sleeping. He denies CP, palpitations, pedal edema, weight gain, dizziness or orthopnea.  He is enrolled in the [facility] [place] program since discharge, and is weighing, checking his BP and submitting his EKG daily.   Past Medical History:  Diagnosis Date   Adrenal adenoma, left    Anxiety    Aortic atherosclerosis (HCC)    Asthma without status asthmaticus    Atherosclerotic cerebrovascular disease    Overview:  Small and facial weakness with minimal weakness    CAD (coronary artery disease)    a. 05/2010 Inf STEMI/PCI: RCA 139m(3.5x24 Promus DES), LCX small, 100, D1 50ost, D2 40ost, EF 45-50%; b. 03/2013 Cath ([facility]): 40% mLAD, 20% stenosis mRCA (site of the previous stent); LVEF 55%.   Cervical disc disease    a. s/p C3-4 fusion 2003.   [place]    a. 09/2016 Admit w/ left sided wkns->found to have progressive cervical disc  dzs above and below prior fusioin site; b. 10/2016 s/p ant cervical diskectomy.   COPD (chronic obstructive pulmonary disease) ([facility]    Cryptogenic stroke ([place] 05/2013    s/p acute L MCA territory infarct; b. 07/2013 s/p MDT Linq ILR-->No AFib to documented to date (09/2016).   Current use of long term anticoagulation    DAPT therapy (ASA + clopidogrel)   Depression    Diabetes mellitus without complication (HCC)    Essential (primary) hypertension    Gastritis    GERD (gastroesophageal reflux disease)    H/O insomnia    History of cerebrovascular accident (CVA) with residual deficit 2015   facial drooping on one side of face is only residual since stroke   IDA (iron deficiency anemia)    Inguinal hernia    Insomnia disorder    Ischemic cardiomyopathy    a. 05/2010 LV gram: EF 45-50% @ time of inf MI; b. 09/2016 Echo: EF 50-55%, inf HK, Gr1 DD, mild MR.   Mixed hyperlipidemia    Nephrolithiasis    Obesity    OSA (obstructive sleep apnea)    no cpap   Osteoarthritis    [place]) [place]   1 stent   Tobacco abuse    Urinary incontinence    Varicose veins    Vertigo    Past Surgical History:  Procedure Laterality Date   ANTERIOR CERVICAL DECOMP/DISCECTOMY FUSION N/A 10/22/2016   Procedure: ANTERIOR CERVICAL DECOMPRESSION/DISCECTOMY FUSION 2 LEVELS C3-4, C6-7;  Surgeon: YMeade Maw MD;  Location: [facility] ORS;  Service: Neurosurgery;  Laterality: N/A;   CARDIAC CATHETERIZATION  06/07/2010   50 % ostial narrowing in 1st diagonal from LAD, 2nd diagonal had 40% ostial narrowing; AV groove circumflex was diminutive and occluded proximally; RCA totally occluded in mid segment - stented with 3.5 x 51m Promus Element DES  - See Media tab   CARDIAC CATHETERIZATION  04/04/2013   40% mLAD, 20% mRCA at the site of the previous stent.  LVEF was 55%.   COLONOSCOPY WITH PROPOFOL N/A 02/26/2015   Procedure: COLONOSCOPY WITH PROPOFOL;  Surgeon: MJosefine Class MD;  [facility];  Service: Endoscopy;  Laterality: N/A;   COLONOSCOPY WITH PROPOFOL N/A 03/16/2020   Procedure: COLONOSCOPY WITH PROPOFOL;  Surgeon: LLesly Rubenstein MD;  Location: [facility] ENDOSCOPY;  Service: Endoscopy;  Laterality: N/A;   ESOPHAGOGASTRODUODENOSCOPY  02/26/2015   Procedure: ESOPHAGOGASTRODUODENOSCOPY (EGD);  Surgeon: MJosefine Class MD;  [facility];  Service: Endoscopy;;   ESOPHAGOGASTRODUODENOSCOPY (EGD) WITH PROPOFOL N/A 03/16/2020   Procedure: ESOPHAGOGASTRODUODENOSCOPY (EGD) WITH PROPOFOL;  Surgeon: LLesly Rubenstein MD;  Location: [facility] ENDOSCOPY;  Service: Endoscopy;  Laterality: N/A;   EYE SURGERY     cataract replaced   LOOP RECORDER IMPLANT  08/16/13   Dr. CSallyanne Kuster  XI ROBOTIC ASSISTED INGUINAL HERNIA REPAIR WITH MESH Left 05/23/2020   Procedure: XI ROBOTIC [place]  Surgeon: CHerbert Pun MD;  Location: [facility] ORS;  Service: General;  Laterality: Left;   Family History  Problem Relation Age of Onset   Cancer Mother        oral   Stroke Maternal Grandmother    Diabetes Brother    Coronary artery disease Brother    Social History   Tobacco Use   Smoking status: Former    Packs/day: 1.00    Years: 53.00    Pack years: 53.00    Types: Cigarettes    Quit date: 11/14/2019    Years since quitting: 1.3   Smokeless tobacco: Never  Substance Use Topics   Alcohol use: No   Allergies  Allergen Reactions   Penicillins Anaphylaxis, Hives and Nausea And Vomiting    As a child Has patient had a PCN reaction causing immediate rash, facial/tongue/throat swelling, SOB or lightheadedness with hypotension: No Has patient had a PCN reaction causing severe rash involving mucus membranes or skin necrosis: No Has patient had a PCN reaction that required hospitalization No Has patient had a PCN reaction occurring within the last 10 years: No If all of the above answers are "NO", then may  proceed with Cephalosporin use.    Prior to Admission medications   Medication Sig Start Date End Date Taking? Authorizing Provider  acetaminophen (TYLENOL) 500 MG tablet Take 1,000 mg by mouth every 8 (eight) hours as needed for mild pain.     [provider]  aspirin 81 MG chewable tablet Chew 81 mg by mouth daily.    [provider]  clopidogrel (PLAVIX) 75 MG tablet TAKE 1 TABLET(75 MG) BY MOUTH DAILY 04/17/20   Croitoru, Mihai, MD  COMBIVENT RESPIMAT 20-100 MCG/ACT AERS respimat Inhale 1 puff into the lungs every 6 (six) hours as needed for wheezing. 01/02/16   [provider]  ferrous sulfate 325 (65 FE) MG EC tablet Take 1 tablet (325 mg total) by mouth in the morning and at bedtime. 01/03/20   Croitoru, Mihai, MD  furosemide (LASIX) 40 MG tablet Take 1 tablet (40 mg total) by mouth daily. 03/22/21 04/21/21  LEnzo Bi MD  meloxicam (MOBIC) 7.5 MG tablet TAKE 1 TABLET(7.5 MG) BY MOUTH EVERY DAY AS NEEDED FOR PAIN 02/19/21   [provider]  pantoprazole (PROTONIX) 40 MG tablet Take 40 mg by mouth daily.    [provider]  PARoxetine (PAXIL) 40 MG tablet Take 40 mg by mouth daily.  11/16/13   [provider]  pravastatin (PRAVACHOL) 40 MG tablet TAKE 1 TABLET(40 MG) BY MOUTH DAILY 07/16/20   Croitoru, Dani Gobble, MD  predniSONE (DELTASONE) 20 MG tablet Take 40 mg (2 tabs) from 3/4 to 3/6, then 20 mg (1 tab) from 3/7 to 3/9, then 10 mg (1/2 tab) from 3/10 to 3/12, then done. 03/22/21   Enzo Bi, MD  ramipril (ALTACE) 10 MG capsule TAKE 1 CAPSULE(10 MG) BY MOUTH DAILY 04/17/20   Croitoru, Dani Gobble, MD  traZODone (DESYREL) 100 MG tablet Take 50 mg by mouth at bedtime as needed for sleep.     [provider]    Review of Systems  Constitutional:  Positive for malaise/fatigue (improving since d/c).  HENT: Negative.    Eyes:  Negative for blurred vision and double vision.  Respiratory:  Positive for cough, shortness of breath (improving since d/c)  and wheezing.   Cardiovascular:  Negative for chest pain, palpitations, orthopnea and leg swelling.  Gastrointestinal: Negative.   Genitourinary: Negative.   Musculoskeletal:  Positive for back pain (occasionally).  Skin: Negative.   Neurological:  Negative for dizziness, loss of consciousness and weakness.  Endo/Heme/Allergies: Negative.   Psychiatric/Behavioral:  Negative for memory loss. The patient is not nervous/anxious.     Vitals:   03/28/21 0909  BP: (!) 152/81  Pulse: 80  Resp: 20  SpO2: 97%  Weight: 202 lb 1 oz (91.7 kg)  Height: '5\' 11"'$  (1.803 m)   Wt Readings from Last 3 Encounters:  03/28/21 202 lb 1 oz (91.7 kg)  03/22/21 197 lb 8.5 oz (89.6 kg)  12/21/20 230 lb 6.4 oz (104.5 kg)   Lab Results  Component Value Date   CREATININE 1.00 03/22/2021   CREATININE 1.17 03/21/2021   CREATININE 1.17 03/20/2021   Physical Exam Vitals and nursing note reviewed.  Constitutional:      General: He is not in acute distress.    Appearance: He is normal weight. He is not toxic-appearing.  Neck:     Vascular: No carotid bruit.  Cardiovascular:     Rate and Rhythm: Normal rate and regular rhythm.     Pulses: Normal pulses.     Heart sounds: Normal heart sounds. No murmur heard. Pulmonary:     Effort: No respiratory distress.     Breath sounds: Wheezing (scattered RML and RLL) present. No rales.  Abdominal:     Palpations: Abdomen is soft.  Skin:    General: Skin is warm and dry.  Neurological:     General: No focal deficit present.     Mental Status: He is alert and oriented to person, place, and time.  Psychiatric:        Mood and Affect: Mood normal.        Judgment: Judgment normal.     Assessment and Plan:  Heart failure with reduced ejection fraction - NHYA II - euvolemic - on ramipril  - starting on Entresto, metoprolol per [facility] program and is to pick these up today - [facility] program will complete bi-monthly video visits with him -  he verbalized washout period for his ramipril, took last dose today and will start Entresto on  Saturday - weighing daily, weight was up 5 lbs but he weighed with his work clothes on and understands not to do that; knows to call for an overnight weight gain of > 2 pounds or a weekly weight gain of > 5 pounds - discussed low sodium diet, states his wife has HF and already adheres to low sodium - discussed ~ 64 ounces/day total fluid - discussed other GDMT and what the ultimate goal is - taking lasix daily - BNP 03/19/21 197.4  2. HTN - BP 152/81, had just taken his am meds but states this is consistent with what they have been at home - will see PCP [place]) 03/29/21 - BMP 03/22/21 Sodium 139, Potassium 3.7, Creatinine 1.0, GFR > 60  3. CAD - plavix, pravastatin  - saw cardiology (Croitoru) 12/22; returns 04/19/21  4. OSA - discharged home with O2 2 lpm HS, sleeping better but continues to snore - referral made for sleep study   5. Tobacco abuse - smoking 1 PPD - [facility] MD sent in prescription for Chantix as well - desires urge to quit    Discussed medications, he did not bring but is well versed in his medication regimen.   Return in 6 weeks or sooner if needed.

## 2021-03-28 ENCOUNTER — Encounter: Payer: Self-pay | Admitting: Family

## 2021-03-28 ENCOUNTER — Ambulatory Visit: Payer: Medicare HMO | Attending: Family | Admitting: Family

## 2021-03-28 ENCOUNTER — Other Ambulatory Visit: Payer: Self-pay

## 2021-03-28 ENCOUNTER — Other Ambulatory Visit: Payer: Self-pay | Admitting: Family

## 2021-03-28 VITALS — BP 152/81 | HR 80 | Resp 20 | Ht 71.0 in | Wt 202.1 lb

## 2021-03-28 DIAGNOSIS — Z955 Presence of coronary angioplasty implant and graft: Secondary | ICD-10-CM | POA: Insufficient documentation

## 2021-03-28 DIAGNOSIS — I2510 Atherosclerotic heart disease of native coronary artery without angina pectoris: Secondary | ICD-10-CM | POA: Diagnosis not present

## 2021-03-28 DIAGNOSIS — E1136 Type 2 diabetes mellitus with diabetic cataract: Secondary | ICD-10-CM | POA: Diagnosis not present

## 2021-03-28 DIAGNOSIS — I10 Essential (primary) hypertension: Secondary | ICD-10-CM

## 2021-03-28 DIAGNOSIS — F17210 Nicotine dependence, cigarettes, uncomplicated: Secondary | ICD-10-CM | POA: Insufficient documentation

## 2021-03-28 DIAGNOSIS — Z7952 Long term (current) use of systemic steroids: Secondary | ICD-10-CM | POA: Insufficient documentation

## 2021-03-28 DIAGNOSIS — J449 Chronic obstructive pulmonary disease, unspecified: Secondary | ICD-10-CM | POA: Diagnosis not present

## 2021-03-28 DIAGNOSIS — Z79899 Other long term (current) drug therapy: Secondary | ICD-10-CM | POA: Diagnosis not present

## 2021-03-28 DIAGNOSIS — G4733 Obstructive sleep apnea (adult) (pediatric): Secondary | ICD-10-CM | POA: Insufficient documentation

## 2021-03-28 DIAGNOSIS — I252 Old myocardial infarction: Secondary | ICD-10-CM | POA: Insufficient documentation

## 2021-03-28 DIAGNOSIS — Z9981 Dependence on supplemental oxygen: Secondary | ICD-10-CM | POA: Insufficient documentation

## 2021-03-28 DIAGNOSIS — I110 Hypertensive heart disease with heart failure: Secondary | ICD-10-CM | POA: Diagnosis not present

## 2021-03-28 DIAGNOSIS — I5022 Chronic systolic (congestive) heart failure: Secondary | ICD-10-CM | POA: Insufficient documentation

## 2021-03-28 DIAGNOSIS — Z7902 Long term (current) use of antithrombotics/antiplatelets: Secondary | ICD-10-CM | POA: Diagnosis not present

## 2021-03-28 DIAGNOSIS — Z8673 Personal history of transient ischemic attack (TIA), and cerebral infarction without residual deficits: Secondary | ICD-10-CM | POA: Insufficient documentation

## 2021-03-28 DIAGNOSIS — M549 Dorsalgia, unspecified: Secondary | ICD-10-CM | POA: Insufficient documentation

## 2021-03-28 DIAGNOSIS — Z87891 Personal history of nicotine dependence: Secondary | ICD-10-CM

## 2021-03-28 MED ORDER — SACUBITRIL-VALSARTAN 49-51 MG PO TABS: 1.0000 | ORAL_TABLET | Freq: Two times a day (BID) | ORAL | 3 refills | Status: DC

## 2021-03-28 MED ORDER — CARVEDILOL 3.125 MG PO TABS: 3.1250 mg | ORAL_TABLET | Freq: Two times a day (BID) | ORAL | 3 refills | Status: DC

## 2021-03-28 MED ORDER — VARENICLINE TARTRATE 1 MG PO TABS: ORAL_TABLET | ORAL | 0 refills | Status: DC

## 2021-03-28 NOTE — Progress Notes (Signed)
Updated med list from [place] cardiologist to include: chantix, carvedilol and entresto with stoppage of ramipril ?

## 2021-03-28 NOTE — Patient Instructions (Addendum)
Weigh daily, if you have a weight gain of 3 lbs/day or 5 lbs/week, call us. ? ?Restrict your total liquid intake ~ 64 ounces/day. ? ?Do not consume > 2,000 mg of sodium/day.  ? ?You are doing great!!!! ? ?We will see you back in 6 weeks.  ?

## 2021-04-01 ENCOUNTER — Encounter: Payer: Self-pay | Admitting: Anesthesiology

## 2021-04-01 ENCOUNTER — Encounter: Admission: RE | Payer: Self-pay | Source: Home / Self Care

## 2021-04-01 ENCOUNTER — Ambulatory Visit: Admission: RE | Admit: 2021-04-01 | Payer: Medicare HMO | Source: Home / Self Care

## 2021-04-01 SURGERY — COLONOSCOPY WITH PROPOFOL
Anesthesia: General

## 2021-04-01 MED ORDER — PROPOFOL 500 MG/50ML IV EMUL
INTRAVENOUS | Status: AC
  Filled 2021-04-01: qty 50

## 2021-04-01 MED ORDER — LIDOCAINE HCL (PF) 2 % IJ SOLN
INTRAMUSCULAR | Status: AC
  Filled 2021-04-01: qty 5

## 2021-04-01 NOTE — Anesthesia Preprocedure Evaluation (Deleted)
Anesthesia Evaluation  ?Patient identified by MRN, date of birth, ID band ?Patient awake ? ? ? ?Reviewed: ?Allergy & Precautions, NPO status , Patient's Chart, lab work & pertinent test results, reviewed documented beta blocker date and time  ? ?Airway ?Mallampati: II ? ?TM Distance: >3 FB ?Neck ROM: Limited ? ? ?Comment: C-spine fusion--C2-3--for radicular pain. Limited extension and flexion. Dental ? ?(+) Upper Dentures, Lower Dentures,  ?  ?Pulmonary ?shortness of breath and with exertion, asthma , sleep apnea and Continuous Positive Airway Pressure Ventilation , COPD,  COPD inhaler, Current Smoker, former smoker,  ?Pack per day for many years. ? ?O2 2 lpm HS ?  ?Pulmonary exam normal ? ?+ decreased breath sounds ? ? ? ? ? Cardiovascular ?Exercise Tolerance: Poor ?hypertension, Pt. on medications and Pt. on home beta blockers ?+ CAD, + Past MI, + Cardiac Stents (2012), + Peripheral Vascular Disease, +CHF (NHYA II) and + DOE  ?Normal cardiovascular exam+ dysrhythmias (RBBB 3/1)  ?Rhythm:Regular Rate:Normal ? ?Hx of MI in 2012, and a CVA, on Plavix and ASA--held for these procedures. ? ? ?Pt was admitted 2/23-03/22/21 with COPD and HF exacerbation. Subsequently diuresed, treated with steroids and d/c'd home on supplemental O2. He has been seen in outpatient heart failure clinic with slight SOB. ? ?Echo report from 03/20/21 reviewed and showed: Left ventricular ejection fraction, by estimation, is 35 to 40%. The left ventricle has moderately decreased function. The left ventricle demonstrates regional wall motion abnormalities. The left ventricular internal cavity size was normal in size. There is no left ventricular hypertrophy. Left ventricular diastolic parameters are consistent with Grade I diastolic dysfunction (impaired relaxation).  ?  ?Neuro/Psych ?PSYCHIATRIC DISORDERS Anxiety Depression 10/2016 s/p ant cervical diskectomy ?CVA, No Residual Symptoms   ? GI/Hepatic ?Neg liver  ROS, GERD  ,  ?Endo/Other  ?diabetes ? Renal/GU ?Renal diseaseStones.  ?negative genitourinary ?  ?Musculoskeletal ? ?(+) Arthritis , Osteoarthritis,   ? Abdominal ?Normal abdominal exam  (+)  ?Abdomen: soft. ? ?  ?Peds ?negative pediatric ROS ?(+)  Hematology ?negative hematology ROS ?(+) Blood dyscrasia, anemia ,   ?Anesthesia Other Findings ?Past Medical History: ?No date: Asthma without status asthmaticus ?No date: Atherosclerotic cerebrovascular disease ?    Comment:  Overview:  Small and facial weakness with minimal  ?             weakness  ?No date: CAD (coronary artery disease) ?    Comment:  a. 05/2010 Inf STEMI/PCI: RCA 118m(3.5x24 Promus DES),  ?             LCX small, 100, D1 50ost, D2 40ost, EF 45-50%; b. 03/2013  ?             Cath ([facility]: Report not available - D/C summary says ?             no significant dzs. ?No date: Cervical disc disease ?    Comment:  a. s/p C3-4 fusion 2003. ?No date: [place]ical myelopathy ([facility] ?    Comment:  a. 09/2016 Admit w/ left sided wkns->found to have  ?             progressive cervical disc dzs above and below prior  ?             fusioin site. ?No date: COPD (chronic obstructive pulmonary disease) ([place] ?No date: Cryptogenic stroke ([facility] ?    Comment:  a. 05/2013 s/p acute L MCA territory infarct; b. 07/2013  ?  s/p MDT Linq ILR-->No AFib to documented to date  ?             (09/2016). ?No date: Depression ?No date: Essential (primary) hypertension ?No date: H/O insomnia ?No date: Inguinal hernia ?No date: Ischemic cardiomyopathy ?    Comment:  a. 05/2010 LV gram: EF 45-50% @ time of inf MI; b. 09/2016 ?             Echo: EF 50-55%, inf HK, Gr1 DD, mild MR. ?No date: Mixed hyperlipidemia ?No date: Nephrolithiasis ?No date: Obesity ?No date: OSA (obstructive sleep apnea) ?No date: Osteoarthritis ?06/07/2010: STEMI (ST elevation myocardial infarction) ([place]) ?No date: Tobacco abuse ?No date: Urinary incontinence ?No date: Varicose veins ?  Reproductive/Obstetrics ? ?  ? ? ? ? ? ? ? ? ? ? ? ? ? ?  ?  ? ? ? ? ?Anesthesia Physical ? ?Anesthesia Plan ? ?ASA: III ? ?Anesthesia Plan: General  ? ?Post-op Pain Management:   ? ?Induction: Intravenous ? ?PONV Risk Score and Plan: TIVA and Propofol infusion ? ?Airway Management Planned: Natural Airway and Simple Face Mask ? ?Additional Equipment:  ? ?Intra-op Plan:  ? ?Post-operative Plan:  ? ?Informed Consent:  ? ?Plan Discussed with: CRNA and Surgeon ? ?Anesthesia Plan Comments:   ? ? ? ? ? ?Anesthesia Quick Evaluation ? ?

## 2021-04-12 ENCOUNTER — Encounter: Payer: Self-pay | Admitting: Cardiovascular Disease

## 2021-04-15 ENCOUNTER — Other Ambulatory Visit: Payer: Self-pay | Admitting: Family

## 2021-04-15 DIAGNOSIS — I509 Heart failure, unspecified: Secondary | ICD-10-CM | POA: Diagnosis not present

## 2021-04-15 MED ORDER — DAPAGLIFLOZIN PROPANEDIOL 10 MG PO TABS: 10.0000 mg | ORAL_TABLET | Freq: Every day | ORAL | 0 refills | Status: DC

## 2021-04-15 MED ORDER — CARVEDILOL 6.25 MG PO TABS: 6.2500 mg | ORAL_TABLET | Freq: Two times a day (BID) | ORAL | 3 refills | Status: DC

## 2021-04-15 NOTE — Progress Notes (Signed)
[facility] Cardiologist increased carvedilol and added farxiga ?

## 2021-04-19 ENCOUNTER — Ambulatory Visit (INDEPENDENT_AMBULATORY_CARE_PROVIDER_SITE_OTHER): Payer: Medicare HMO | Admitting: Family

## 2021-04-19 ENCOUNTER — Telehealth: Payer: Self-pay | Admitting: *Deleted

## 2021-04-19 ENCOUNTER — Telehealth (HOSPITAL_BASED_OUTPATIENT_CLINIC_OR_DEPARTMENT_OTHER): Payer: Self-pay

## 2021-04-19 ENCOUNTER — Encounter (HOSPITAL_BASED_OUTPATIENT_CLINIC_OR_DEPARTMENT_OTHER): Payer: Self-pay | Admitting: Family

## 2021-04-19 ENCOUNTER — Encounter (INDEPENDENT_AMBULATORY_CARE_PROVIDER_SITE_OTHER): Payer: Medicare HMO | Admitting: Cardiology

## 2021-04-19 VITALS — BP 110/68 | HR 67 | Ht 71.0 in | Wt 194.5 lb

## 2021-04-19 DIAGNOSIS — I2510 Atherosclerotic heart disease of native coronary artery without angina pectoris: Secondary | ICD-10-CM

## 2021-04-19 DIAGNOSIS — E785 Hyperlipidemia, unspecified: Secondary | ICD-10-CM | POA: Diagnosis not present

## 2021-04-19 DIAGNOSIS — I5022 Chronic systolic (congestive) heart failure: Secondary | ICD-10-CM

## 2021-04-19 DIAGNOSIS — G4733 Obstructive sleep apnea (adult) (pediatric): Secondary | ICD-10-CM | POA: Diagnosis not present

## 2021-04-19 DIAGNOSIS — R0683 Snoring: Secondary | ICD-10-CM | POA: Diagnosis not present

## 2021-04-19 NOTE — Patient Instructions (Addendum)
Medication Instructions:  ?Continue your current medications.  ? ?We may consider adjusting medications based on blood work.  ? ?*If you need a refill on your cardiac medications before your next appointment, please call your pharmacy* ? ? ?Lab Work: ?Your physician recommends that you return for lab work today: CBC, direct LDL, CMP, BNP  ? ?If you have labs (blood work) drawn today and your tests are completely normal, you will receive your results only by: ?MyChart Message (if you have MyChart) OR ?A paper copy in the mail ?If you have any lab test that is abnormal or we need to change your treatment, we will call you to review the results. ? ? ?Testing/Procedures: ? ?WatchPAT??  Is a FDA cleared portable home sleep study test that uses a watch and 3 points of contact to monitor 7 different channels, including your heart rate, oxygen saturations, body position, snoring, and chest motion.  The study is easy to use from the comfort of your own home and accurately detect sleep apnea.  Before bed, you attach the chest sensor, attached the sleep apnea bracelet to your nondominant hand, and attach the finger probe.  After the study, the raw data is downloaded from the watch and scored for apnea events.   ?For more information: [email or web address]  ? ? ?Follow-Up: ?At [facility], you and your health needs are our priority.  As part of our continuing mission to provide you with exceptional heart care, we have created designated Provider Care Teams.  These Care Teams include your primary Cardiologist (physician) and Advanced Practice Providers (APPs -  Physician Assistants and Nurse Practitioners) who all work together to provide you with the care you need, when you need it. ? ?We recommend signing up for the patient portal called "MyChart".  Sign up information is provided on this After Visit Summary.  MyChart is used to connect with patients for Virtual Visits (Telemedicine).  Patients are able  to view lab/test results, encounter notes, upcoming appointments, etc.  Non-urgent messages can be sent to your provider as well.   ?To learn more about what you can do with MyChart, go to [email or web address].   ? ?Your next appointment:   ?May 11th at 11AM with Dr. Sallyanne Kuster ? ? ?Other Instructions ? ?Heart Healthy Diet Recommendations: ?A low-salt diet is recommended. Meats should be grilled, baked, or boiled. Avoid fried foods. Focus on lean protein sources like fish or chicken with vegetables and fruits. The American Heart Association is a Microbiologist!  American Heart Association Diet and Lifeystyle Recommendations   ?Please drink less than 2 liters of fluid per day.  ? ?Exercise recommendations: ?The American Heart Association recommends 150 minutes of moderate intensity exercise weekly. ?Try 30 minutes of moderate intensity exercise 4-5 times per week. ?This could include walking, jogging, or swimming. ? ? ?Recommend weighing daily and keeping a log. Please call our office if you have weight gain of 2 pounds overnight or 5 pounds in 1 week.  ? ?   ?

## 2021-04-19 NOTE — Progress Notes (Signed)
? ?Office Visit  ?  ?Patient Name: Louis Gutierrez ?Date of Encounter: 04/19/2021 ? ?PCP:  Kirk Ruths, MD ?  ?[place]  ?Cardiologist:  Sanda Klein, MD  ?Advanced Practice Provider:  No care team member to display ?Electrophysiologist:  None  ? ? ?Chief Complaint  ?  ?Louis Gutierrez is a 71 y.o. male with a hx of heart failure, coronary artery disease, cryptogenic stroke, hyperlipidemia presents today for hospital follow up.   ? ?Past Medical History  ?  ?Past Medical History:  ?Diagnosis Date  ? Adrenal adenoma, left   ? Anxiety   ? Aortic atherosclerosis ([place])   ? Asthma without status asthmaticus   ? Atherosclerotic cerebrovascular disease   ? Overview:  Small and facial weakness with minimal weakness   ? CAD (coronary artery disease)   ? a. 05/2010 Inf STEMI/PCI: RCA 174m(3.5x24 Promus DES), LCX small, 100, D1 50ost, D2 40ost, EF 45-50%; b. 03/2013 Cath ([facility]): 40% mLAD, 20% stenosis mRCA (site of the previous stent); LVEF 55%.  ? Cervical disc disease   ? a. s/p C3-4 fusion 2003.  ? Cervical myelopathy ([place]   ? a. 09/2016 Admit w/ left sided wkns->found to have progressive cervical disc dzs above and below prior fusioin site; b. 10/2016 s/p ant cervical diskectomy.  ? COPD (chronic obstructive pulmonary disease) ([place]   ? Cryptogenic stroke ([place] 05/[place]  ?  s/p acute L MCA territory infarct; b. 07/2013 s/p MDT Linq ILR-->No AFib to documented to date (09/2016).  ? Current use of long term anticoagulation   ? DAPT therapy (ASA + clopidogrel)  ? Depression   ? Diabetes mellitus without complication ([place]   ? Essential (primary) hypertension   ? Gastritis   ? GERD (gastroesophageal reflux disease)   ? H/O insomnia   ? History of cerebrovascular accident (CVA) with residual deficit 2015  ? facial drooping on one side of face is only residual since stroke  ? IDA (iron deficiency anemia)   ? Inguinal hernia   ? Insomnia disorder   ? Ischemic cardiomyopathy   ? a. 05/2010 LV  gram: EF 45-50% @ time of inf MI; b. 09/2016 Echo: EF 50-55%, inf HK, Gr1 DD, mild MR.  ? Mixed hyperlipidemia   ? Nephrolithiasis   ? Obesity   ? OSA (obstructive sleep apnea)   ? no cpap  ? Osteoarthritis   ? STEMI (ST elevation myocardial infarction) ([place] 06/07/2010  ? 1 stent  ? Tobacco abuse   ? Urinary incontinence   ? Varicose veins   ? Vertigo   ? ?Past Surgical History:  ?Procedure Laterality Date  ? ANTERIOR CERVICAL DECOMP/DISCECTOMY FUSION N/A 10/22/2016  ? Procedure: ANTERIOR CERVICAL DECOMPRESSION/DISCECTOMY FUSION 2 LEVELS C3-4, C6-7;  Surgeon: YMeade Maw MD;  Location: [facility] ORS;  Service: Neurosurgery;  Laterality: N/A;  ? CARDIAC CATHETERIZATION  06/07/2010  ? 50 % ostial narrowing in 1st diagonal from LAD, 2nd diagonal had 40% ostial narrowing; AV groove circumflex was diminutive and occluded proximally; RCA totally occluded in mid segment - stented with 3.5 x 2110mPromus Element DES  - See Media tab  ? CARDIAC CATHETERIZATION  04/04/2013  ? 40% mLAD, 20% mRCA at the site of the previous stent.  LVEF was 55%.  ? COLONOSCOPY WITH PROPOFOL N/A 02/26/2015  ? Procedure: COLONOSCOPY WITH PROPOFOL;  Surgeon: MaJosefine ClassMD;  [facility];  Service: Endoscopy;  Laterality: N/A;  ? COLONOSCOPY WITH PROPOFOL N/A 03/16/2020  ?  Procedure: COLONOSCOPY WITH PROPOFOL;  Surgeon: Lesly Rubenstein, MD;  Location: [facility]COPY;  Service: Endoscopy;  Laterality: N/A;  ? ESOPHAGOGASTRODUODENOSCOPY  02/26/2015  ? Procedure: ESOPHAGOGASTRODUODENOSCOPY (EGD);  Surgeon: Josefine Class, MD;  Location: [facility] ENDOSCOPY;  Service: Endoscopy;;  ? ESOPHAGOGASTRODUODENOSCOPY (EGD) WITH PROPOFOL N/A 03/16/2020  ? Procedure: ESOPHAGOGASTRODUODENOSCOPY (EGD) WITH PROPOFOL;  Surgeon: Lesly Rubenstein, MD;  Location: [facility] ENDOSCOPY;  Service: Endoscopy;  Laterality: N/A;  ? EYE SURGERY    ? cataract replaced  ? LOOP RECORDER IMPLANT  08/16/13  ? Dr. Sallyanne Kuster  ? XI ROBOTIC ASSISTED INGUINAL HERNIA  REPAIR WITH MESH Left 05/23/2020  ? Procedure: XI ROBOTIC ASSISTED INGUINAL HERNIA REPAIR WITH MESH;  Surgeon: Herbert Pun, MD;  Location: [facility] ORS;  Service: General;  Laterality: Left;  ? ? ?Allergies ? ?Allergies  ?Allergen Reactions  ? Penicillins Anaphylaxis, Hives and Nausea And Vomiting  ?  As a child ?Has patient had a PCN reaction causing immediate rash, facial/tongue/throat swelling, SOB or lightheadedness with hypotension: No ?Has patient had a PCN reaction causing severe rash involving mucus membranes or skin necrosis: No ?Has patient had a PCN reaction that required hospitalization No ?Has patient had a PCN reaction occurring within the last 10 years: No ?If all of the above answers are "NO", then may proceed with Cephalosporin use. ?  ? ? ?History of Present Illness  ?  ?Louis Gutierrez is a 71 y.o. male with a hx of heart failure, coronary artery disease, hyperlipidemia, cryptogenic stroke last seen 12/21/2020 by Dr. Sallyanne Kuster. ? ?He presented with STEMI May 2012 with RCA found to be totally occluded and revascularization difficult but eventually received 3.5X 24 mm Promus DES to the mid RCA.  Also noted 50% ostial LAD stenosis of first diagonal artery and 40% ostial stenosis of the second diagonal artery branches of the LAD and total occlusion of small left circumflex.  EF 45 to 50% at the time. ? ?He presented May 2015 with right hemiparesis and expressive aphasia with an infarction in the territory of the left middle cerebral artery.  Etiology not determined.  Her ILR was placed with no arrhythmias over 3 year battery life.  Echo September 2018 LVEF 50 to 55%, LA normal size. ? ?He was seen in clinic 12/2020 by Dr. Sallyanne Kuster.  He was doing overall well cardiac perspective.  His chronic shortness of breath, cough, wheezing were stable at baseline. ? ?Admitted to [facility] 03/19/21-03/22/21 for CHF and COPD exacerbation. Discharged on 2L O2. Echo with LVEF 45-50%, gr1DD, mild MR. Cardiology  not consulted during admission.  He was treated with IV Lasix, IV Solu-Medrol, prednisone, DuoNeb. ? ?Very pleasant gentleman who presents independently today for follow-up. 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  Working about 4 hours per day and enjoys staying busy. He lives with his wife and 2 daughters.  He is planning to spend the weekend with his grandchildren who are 70 and 18 years old.  He has a scale, BP cuff at home, a single trace EKG at home. Weight at home routinely 185.  BP has been well controlled.  He follows a low-sodium diet.  Reports no chest pain, pressure, tightness.  Reports mild exertional dyspnea.  Denies orthopnea, PND, edema. ? ?EKGs/Labs/Other Studies Reviewed:  ? ?The following studies were reviewed today: ? ?Echo 03/2021 ? 1. Left ventricular ejection fraction, by estimation, is 35 to 40%. The  ?left ventricle has moderately decreased function. The left ventricle  ?  demonstrates regional wall motion abnormalities (see scoring  ?diagram/findings for description). Left ventricular  ? diastolic parameters are consistent with Grade I diastolic dysfunction  ?(impaired relaxation). There is LV global hypokinesis with inferior and  ?inferolateral wall akinesis.  ? 2. Right ventricular systolic function is normal. The right ventricular  ?size is normal.  ? 3. The mitral valve is normal in structure. Mild mitral valve  ?regurgitation.  ? 4. The aortic valve is tricuspid. Aortic valve regurgitation is not  ?visualized. Aortic valve sclerosis is present, with no evidence of aortic  ?valve stenosis.  ? 5. Aortic dilatation noted. There is mild dilatation of the aortic root,  ?measuring 40 mm.  ? 6. The inferior vena cava is normal in size with <50% respiratory  ?variability, suggesting right atrial pressure of 8 mmHg.  ? ?EKG:  EKG is  ordered today.  The ekg ordered today demonstrates normal sinus rhythm with first-degree AV block PR 226, LAD, known left bundle branch  block, occasional PVC. ? ?Recent Labs: ?05/23/2020: ALT 22; TSH 1.274 ?03/19/2021: B Natriuretic Peptide 197.4 ?03/22/2021: BUN 44; Creatinine, Ser 1.00; Hemoglobin 14.6; Magnesium 2.5; Platelets 306; Potassium 3.

## 2021-04-19 NOTE — Telephone Encounter (Signed)
Staff message sent to Louis Gutierrez ok to activate itamar. Patient's insurance does not require a PA for HST. ?

## 2021-04-19 NOTE — Telephone Encounter (Signed)
Per sleep coordinator okay to activate [place], [place]  ? ?RN called patient and gave code.  ? ?

## 2021-04-20 LAB — CBC
Hematocrit: 44.2 % (ref 37.5–51.0)
Hemoglobin: 14.8 g/dL (ref 13.0–17.7)
MCH: 29 pg (ref 26.6–33.0)
MCHC: 33.5 g/dL (ref 31.5–35.7)
MCV: 87 fL (ref 79–97)
Platelets: 281 10*3/uL (ref 150–450)
RBC: 5.11 x10E6/uL (ref 4.14–5.80)
RDW: 14.3 % (ref 11.6–15.4)
WBC: 5.3 10*3/uL (ref 3.4–10.8)

## 2021-04-20 LAB — COMPREHENSIVE METABOLIC PANEL
ALT: 11 IU/L (ref 0–44)
AST: 14 IU/L (ref 0–40)
Albumin/Globulin Ratio: 1.5 (ref 1.2–2.2)
Albumin: 4.1 g/dL (ref 3.8–4.8)
Alkaline Phosphatase: 164 IU/L — ABNORMAL HIGH (ref 44–121)
BUN/Creatinine Ratio: 15 (ref 10–24)
BUN: 16 mg/dL (ref 8–27)
Bilirubin Total: 0.9 mg/dL (ref 0.0–1.2)
CO2: 27 mmol/L (ref 20–29)
Calcium: 9.6 mg/dL (ref 8.6–10.2)
Chloride: 99 mmol/L (ref 96–106)
Creatinine, Ser: 1.05 mg/dL (ref 0.76–1.27)
Globulin, Total: 2.8 g/dL (ref 1.5–4.5)
Glucose: 125 mg/dL — ABNORMAL HIGH (ref 70–99)
Potassium: 4.2 mmol/L (ref 3.5–5.2)
Sodium: 141 mmol/L (ref 134–144)
Total Protein: 6.9 g/dL (ref 6.0–8.5)
eGFR: 76 mL/min/{1.73_m2} (ref >59)

## 2021-04-20 LAB — BRAIN NATRIURETIC PEPTIDE: BNP: 60.4 pg/mL (ref 0.0–100.0)

## 2021-04-20 LAB — LDL CHOLESTEROL, DIRECT: LDL Direct: 67 mg/dL (ref 0–99)

## 2021-04-22 ENCOUNTER — Telehealth (HOSPITAL_BASED_OUTPATIENT_CLINIC_OR_DEPARTMENT_OTHER): Payer: Self-pay

## 2021-04-22 ENCOUNTER — Ambulatory Visit: Payer: Medicare HMO

## 2021-04-22 DIAGNOSIS — R0683 Snoring: Secondary | ICD-10-CM

## 2021-04-22 DIAGNOSIS — I5022 Chronic systolic (congestive) heart failure: Secondary | ICD-10-CM

## 2021-04-22 MED ORDER — FUROSEMIDE 20 MG PO TABS: 20.0000 mg | ORAL_TABLET | Freq: Every day | ORAL | 3 refills | Status: DC

## 2021-04-22 MED ORDER — SPIRONOLACTONE 25 MG PO TABS: 12.5000 mg | ORAL_TABLET | Freq: Every day | ORAL | 3 refills | Status: DC

## 2021-04-22 NOTE — Telephone Encounter (Addendum)
Left message for patient to call back ? ? ? ? ?----- Message from Loel Dubonnet, NP sent at 04/21/2021  8:11 PM EDT ----- ?Normal kidneys, electrolytes. Liver enzymes normal. Alkaline phosphatase elevated - recommend avoiding fried foods, alcohol, acetaminophen (Tylenol). CBC with no evidence of anemia nor infection.  BNP with volume overload. LDL at goal.  ? ?Recommend reducing Lasix to '20mg'$  daily and starting Spironolactone 12.'5mg'$  daily for optimization of medical therapy. Repeat BMP in 1-2 weeks. ?

## 2021-04-22 NOTE — Procedures (Signed)
? ?  SLEEP STUDY REPORT ?Patient Information ?Study Date: 04/19/21 ?Patient Name: Louis Gutierrez ?Patient ID: [id number] ?Birth Date: [birth year] ?Age: 71 ?Gender: Male ?BMI: 27.2 (W=194 lb, H=5' 11'') ?Referring Physician: Laurann Montana, NP ? ?TEST DESCRIPTION: Home sleep apnea testing was completed using the WatchPat, a Type 1 device, utilizing ?peripheral arterial tonometry (PAT), chest movement, actigraphy, pulse oximetry, pulse rate, body position and snore. ?AHI was calculated with apnea and hypopnea using valid sleep time as the denominator. RDI includes apneas, ?hypopneas, and RERAs. The data acquired and the scoring of sleep and all associated events were performed in ?accordance with the recommended standards and specifications as outlined in the AASM Manual for the Scoring of ?Sleep and Associated Events 2.2.0 (2015). ? ?FINDINGS: ?1. No evidence of Obstructive Sleep Apnea with AHI 2.9/hr. ?2. No Central Sleep Apnea. ?3. Oxygen desaturations as low as 82%. ?4. Moderate snoring was present. O2 sats were < 88% for 54.7 minutes. ?5. Total sleep time was 5 hrs and 56 min. ?6. 19.9% of total sleep time was spent in REM sleep. ?7. Normal sleep onset latency at 16 min. ?8. Normal REM sleep onset latency at 78 min. ?9. Total awakenings were 13 . ? ?DIAGNOSIS: ?Nocturnal Hypoxemia with no OSA. ? ?RECOMMENDATIONS: ?1. No significant sleep disordered breathing but patient has significant nocturnal hypoxemia. ? ?2. Healthy sleep recommendations include: adequate nightly sleep (normal 7-9 hrs/night), avoidance of caffeine after ?noon and alcohol near bedtime, and maintaining a sleep environment that is cool, dark and quiet. ?3. Weight loss for overweight patients is recommended. ? ?4. Snoring recommendations include: weight loss where appropriate, side sleeping, and avoidance of alcohol before ?bed. ? ?5. Operation of motor vehicle or dangerous equipment must be avoided when feeling drowsy, excessively sleepy, or ?mentally  fatigued. ? ?6. An ENT consultation which may be useful for specific causes of and possible treatment of bothersome snoring . ? ?7. Weight loss may be of benefit in reducing the severity of snoring.  ? ?Signature: ?Electronically Signed: 04/22/21 ?Fransico Him, MD; [facility]; [place], [place] Board of Sleep Medicine ? ?

## 2021-04-22 NOTE — Telephone Encounter (Signed)
Patient returned call. Transferred to RN.  ?

## 2021-04-22 NOTE — Telephone Encounter (Signed)
Please see duplicate telephone encounter.

## 2021-04-22 NOTE — Telephone Encounter (Addendum)
Results called to patient who verbalizes understanding!  ? ?Labs ordered and mailed to patient, medications sent to pharmacy at patients request  ? ? ? ?----- Message from Loel Dubonnet, NP sent at 04/21/2021  8:11 PM EDT ----- ?Normal kidneys, electrolytes. Liver enzymes normal. Alkaline phosphatase elevated - recommend avoiding fried foods, alcohol, acetaminophen (Tylenol). CBC with no evidence of anemia nor infection.  BNP with volume overload. LDL at goal.  ? ?Recommend reducing Lasix to '20mg'$  daily and starting Spironolactone 12.'5mg'$  daily for optimization of medical therapy. Repeat BMP in 1-2 weeks. ?

## 2021-04-24 ENCOUNTER — Other Ambulatory Visit (HOSPITAL_COMMUNITY): Payer: Self-pay

## 2021-04-25 ENCOUNTER — Other Ambulatory Visit (HOSPITAL_COMMUNITY): Payer: Self-pay

## 2021-04-25 DIAGNOSIS — I25119 Atherosclerotic heart disease of native coronary artery with unspecified angina pectoris: Secondary | ICD-10-CM | POA: Diagnosis not present

## 2021-04-25 DIAGNOSIS — E119 Type 2 diabetes mellitus without complications: Secondary | ICD-10-CM | POA: Diagnosis not present

## 2021-04-25 DIAGNOSIS — J449 Chronic obstructive pulmonary disease, unspecified: Secondary | ICD-10-CM | POA: Diagnosis not present

## 2021-04-27 NOTE — Progress Notes (Signed)
Thanks, Urban Gibson ?Looking at the echos side by side I am pretty sure that his right coronary artery is now occluded. A PET may actually be the best approach - if all scar, may not benefit from an invasive approach. I will discuss with him at f/u next month. ?

## 2021-04-30 ENCOUNTER — Encounter (HOSPITAL_BASED_OUTPATIENT_CLINIC_OR_DEPARTMENT_OTHER): Payer: Self-pay

## 2021-04-30 ENCOUNTER — Other Ambulatory Visit: Payer: Self-pay | Admitting: Family

## 2021-04-30 ENCOUNTER — Ambulatory Visit: Payer: Medicare HMO | Attending: Otolaryngology

## 2021-04-30 DIAGNOSIS — R0683 Snoring: Secondary | ICD-10-CM | POA: Diagnosis not present

## 2021-04-30 MED ORDER — SACUBITRIL-VALSARTAN 97-103 MG PO TABS
1.0000 | ORAL_TABLET | Freq: Two times a day (BID) | ORAL | 5 refills | Status: DC
Start: 2021-04-30 — End: 2021-09-09

## 2021-04-30 NOTE — Progress Notes (Signed)
? Patient ID: Louis Gutierrez, male    DOB: 1950-12-23, 71 y.o.   MRN: [id number] ? ?HPI ? ?Louis Gutierrez is a 71 yo male patient with a PMHx of: coronary artery disease, cryptogenic stroke, HTN, DM, tobacco abuse, OSA, HF and COPD.   ? ?He presented to his PCP office on 03/19/21 with chest pain, sent to the ED, admitted 2/23-03/22/21 with COPD and HF exacerbation. Subsequently diuresed, treated with steroids and d/c'd home on supplemental O2.  ? ?Echo report from 03/20/21 reviewed and showed: Left ventricular ejection fraction, by estimation, is 35 to 40%. The left ventricle has moderately decreased function. The left ventricle demonstrates regional wall motion abnormalities. The left ventricular internal cavity size was normal in size. There is no left ventricular hypertrophy. Left ventricular diastolic parameters are consistent with Grade I diastolic dysfunction (impaired relaxation).  ? ?He presents today for a follow-up visit to the heart failure clinic with a chief complaint of minimal shortness of breath with moderate exertion. Describes this as chronic in nature. He has associated wheezing, pedal edema & knee pain along with this. He denies any difficulty sleeping, dizziness, abdominal distention, palpitations, chest pain, cough, fatigue or weight gain.  ? ?He is enrolled in the [facility] program since discharge, and is weighing, checking his BP and submitting his EKG daily.  ? ?Had his sleep study done last night. Wears oxygen at 2L at bedtime.  ? ?Taking meloxicam PRN.  ? ?Past Medical History:  ?Diagnosis Date  ? Adrenal adenoma, left   ? Anxiety   ? Aortic atherosclerosis ([place])   ? Asthma without status asthmaticus   ? Atherosclerotic cerebrovascular disease   ? Overview:  Small and facial weakness with minimal weakness   ? CAD (coronary artery disease)   ? a. 05/2010 Inf STEMI/PCI: RCA 161m(3.5x24 Promus DES), LCX small, 100, D1 50ost, D2 40ost, EF 45-50%; b. 03/2013 Cath ([facility]): 40% mLAD, 20%  stenosis mRCA (site of the previous stent); LVEF 55%.  ? Cervical disc disease   ? a. s/p C3-4 fusion 2003.  ? Cervical myelopathy ([place]   ? a. 09/2016 Admit w/ left sided wkns->found to have progressive cervical disc dzs above and below prior fusioin site; b. 10/2016 s/p ant cervical diskectomy.  ? COPD (chronic obstructive pulmonary disease) ([place]   ? Cryptogenic stroke ([place] 05/2013  ?  s/p acute L MCA territory infarct; b. 07/2013 s/p MDT Linq ILR-->No AFib to documented to date (09/2016).  ? Current use of long term anticoagulation   ? DAPT therapy (ASA + clopidogrel)  ? Depression   ? Diabetes mellitus without complication ([place]   ? Essential (primary) hypertension   ? Gastritis   ? GERD (gastroesophageal reflux disease)   ? H/O insomnia   ? History of cerebrovascular accident (CVA) with residual deficit 2015  ? facial drooping on one side of face is only residual since stroke  ? IDA (iron deficiency anemia)   ? Inguinal hernia   ? Insomnia disorder   ? Ischemic cardiomyopathy   ? a. 05/2010 LV gram: EF 45-50% @ time of inf MI; b. 09/2016 Echo: EF 50-55%, inf HK, Gr1 DD, mild MR.  ? Mixed hyperlipidemia   ? Nephrolithiasis   ? Obesity   ? OSA (obstructive sleep apnea)   ? no cpap  ? Osteoarthritis   ? STEMI (ST elevation myocardial infarction) ([place] 06/07/2010  ? 1 stent  ? Tobacco abuse   ? Urinary incontinence   ? Varicose veins   ?  Vertigo   ? ?Past Surgical History:  ?Procedure Laterality Date  ? ANTERIOR CERVICAL DECOMP/DISCECTOMY FUSION N/A 10/22/2016  ? Procedure: ANTERIOR CERVICAL DECOMPRESSION/DISCECTOMY FUSION 2 LEVELS C3-4, C6-7;  Surgeon: Meade Maw, MD;  Location: [facility] ORS;  Service: Neurosurgery;  Laterality: N/A;  ? CARDIAC CATHETERIZATION  06/07/2010  ? 50 % ostial narrowing in 1st diagonal from LAD, 2nd diagonal had 40% ostial narrowing; AV groove circumflex was diminutive and occluded proximally; RCA totally occluded in mid segment - stented with 3.5 x 65m Promus Element DES  - See Media tab   ? CARDIAC CATHETERIZATION  04/04/2013  ? 40% mLAD, 20% mRCA at the site of the previous stent.  LVEF was 55%.  ? COLONOSCOPY WITH PROPOFOL N/A 02/26/2015  ? Procedure: COLONOSCOPY WITH PROPOFOL;  Surgeon: MJosefine Class MD;  [facility];  Service: Endoscopy;  Laterality: N/A;  ? COLONOSCOPY WITH PROPOFOL N/A 03/16/2020  ? Procedure: COLONOSCOPY WITH PROPOFOL;  Surgeon: LLesly Rubenstein MD;  [facility];  Service: Endoscopy;  Laterality: N/A;  ? ESOPHAGOGASTRODUODENOSCOPY  02/26/2015  ? Procedure: ESOPHAGOGASTRODUODENOSCOPY (EGD);  Surgeon: MJosefine Class MD;  [facility];  Service: Endoscopy;;  ? ESOPHAGOGASTRODUODENOSCOPY (EGD) WITH PROPOFOL N/A 03/16/2020  ? Procedure: ESOPHAGOGASTRODUODENOSCOPY (EGD) WITH PROPOFOL;  Surgeon: LLesly Rubenstein MD;  Location: [facility] ENDOSCOPY;  Service: Endoscopy;  Laterality: N/A;  ? EYE SURGERY    ? cataract replaced  ? LOOP RECORDER IMPLANT  08/16/13  ? Dr. CSallyanne Kuster ? XI ROBOTIC ASSISTED INGUINAL HERNIA REPAIR WITH MESH Left 05/23/2020  ? Procedure: XI ROBOTIC ASSISTED INGUINAL HERNIA REPAIR WITH MESH;  Surgeon: CHerbert Pun MD;  Location: [facility] ORS;  Service: General;  Laterality: Left;  ? ?Family History  ?Problem Relation Age of Onset  ? Cancer Mother   ?     oral  ? Stroke Maternal Grandmother   ? Diabetes Brother   ? Coronary artery disease Brother   ? ?Social History  ? ?Tobacco Use  ? Smoking status: Every Day  ?  Packs/day: 1.00  ?  Years: 53.00  ?  Pack years: 53.00  ?  Types: Cigarettes  ?  Last attempt to quit: 11/14/2019  ?  Years since quitting: 1.4  ? Smokeless tobacco: Never  ? Tobacco comments:  ?  Down to 0.25 packs per day  ?Substance Use Topics  ? Alcohol use: No  ? ?Allergies  ?Allergen Reactions  ? Penicillins Anaphylaxis, Hives and Nausea And Vomiting  ?  As a child ?Has patient had a PCN reaction causing immediate rash, facial/tongue/throat swelling, SOB or lightheadedness with hypotension: No ?Has  patient had a PCN reaction causing severe rash involving mucus membranes or skin necrosis: No ?Has patient had a PCN reaction that required hospitalization No ?Has patient had a PCN reaction occurring within the last 10 years: No ?If all of the above answers are "NO", then may proceed with Cephalosporin use. ?  ? ?Prior to Admission medications   ?Medication Sig Start Date End Date Taking? Authorizing Provider  ?acetaminophen (TYLENOL) 500 MG tablet Take 1,000 mg by mouth every 8 (eight) hours as needed for mild pain.    Yes [provider]  ?aspirin 81 MG chewable tablet Chew 81 mg by mouth daily.   Yes [provider]  ?carvedilol (COREG) 6.25 MG tablet Take 1 tablet (6.25 mg total) by mouth 2 (two) times daily. 04/15/21 07/14/21 Yes HAlisa Graff FNP  ?clopidogrel (PLAVIX) 75 MG tablet TAKE 1 TABLET(75 MG) BY MOUTH  DAILY 04/17/20  Yes Croitoru, Mihai, MD  ?COMBIVENT RESPIMAT 20-100 MCG/ACT AERS respimat Inhale 1 puff into the lungs every 6 (six) hours as needed for wheezing. 01/02/16  Yes [provider]  ?dapagliflozin propanediol (FARXIGA) 10 MG TABS tablet Take 1 tablet (10 mg total) by mouth daily before breakfast. 04/15/21  Yes Alisa Graff, FNP  ?ferrous sulfate 325 (65 FE) MG EC tablet Take 1 tablet (325 mg total) by mouth in the morning and at bedtime. 01/03/20  Yes Croitoru, Mihai, MD  ?furosemide (LASIX) 20 MG tablet Take 1 tablet (20 mg total) by mouth daily. 04/22/21 05/22/21 Yes Loel Dubonnet, NP  ?meloxicam (MOBIC) 7.5 MG tablet TAKE 1 TABLET(7.5 MG) BY MOUTH EVERY DAY AS NEEDED FOR PAIN 02/19/21  Yes [provider]  ?pantoprazole (PROTONIX) 40 MG tablet Take 40 mg by mouth daily.   Yes [provider]  ?PARoxetine (PAXIL) 40 MG tablet Take 40 mg by mouth daily.  11/16/13  Yes [provider]  ?pravastatin (PRAVACHOL) 40 MG tablet TAKE 1 TABLET(40 MG) BY MOUTH DAILY 07/16/20  Yes Croitoru, Mihai, MD  ?sacubitril-valsartan (ENTRESTO) 97-103 MG  Take 1 tablet by mouth 2 (two) times daily. 04/30/21  Yes Alisa Graff, FNP  ?spironolactone (ALDACTONE) 25 MG tablet Take 0.5 tablets (12.5 mg total) by mouth daily. 04/22/21 07/21/21 Yes Loel Dubonnet,

## 2021-05-01 ENCOUNTER — Ambulatory Visit: Payer: Medicare HMO | Attending: Family | Admitting: Family

## 2021-05-01 ENCOUNTER — Encounter: Payer: Self-pay | Admitting: Family

## 2021-05-01 VITALS — BP 107/72 | HR 64 | Resp 14 | Ht 71.0 in | Wt 190.1 lb

## 2021-05-01 DIAGNOSIS — I5022 Chronic systolic (congestive) heart failure: Secondary | ICD-10-CM | POA: Insufficient documentation

## 2021-05-01 DIAGNOSIS — Z87891 Personal history of nicotine dependence: Secondary | ICD-10-CM | POA: Diagnosis not present

## 2021-05-01 DIAGNOSIS — I10 Essential (primary) hypertension: Secondary | ICD-10-CM

## 2021-05-01 DIAGNOSIS — I2510 Atherosclerotic heart disease of native coronary artery without angina pectoris: Secondary | ICD-10-CM | POA: Diagnosis not present

## 2021-05-01 DIAGNOSIS — Z9981 Dependence on supplemental oxygen: Secondary | ICD-10-CM | POA: Insufficient documentation

## 2021-05-01 DIAGNOSIS — Z09 Encounter for follow-up examination after completed treatment for conditions other than malignant neoplasm: Secondary | ICD-10-CM | POA: Insufficient documentation

## 2021-05-01 DIAGNOSIS — I110 Hypertensive heart disease with heart failure: Secondary | ICD-10-CM | POA: Diagnosis not present

## 2021-05-01 DIAGNOSIS — Z7902 Long term (current) use of antithrombotics/antiplatelets: Secondary | ICD-10-CM | POA: Insufficient documentation

## 2021-05-01 DIAGNOSIS — R0602 Shortness of breath: Secondary | ICD-10-CM | POA: Diagnosis not present

## 2021-05-01 DIAGNOSIS — M25569 Pain in unspecified knee: Secondary | ICD-10-CM | POA: Insufficient documentation

## 2021-05-01 DIAGNOSIS — Z7901 Long term (current) use of anticoagulants: Secondary | ICD-10-CM | POA: Insufficient documentation

## 2021-05-01 DIAGNOSIS — J449 Chronic obstructive pulmonary disease, unspecified: Secondary | ICD-10-CM | POA: Diagnosis not present

## 2021-05-01 DIAGNOSIS — G4733 Obstructive sleep apnea (adult) (pediatric): Secondary | ICD-10-CM | POA: Insufficient documentation

## 2021-05-01 DIAGNOSIS — R609 Edema, unspecified: Secondary | ICD-10-CM | POA: Insufficient documentation

## 2021-05-01 DIAGNOSIS — Z8673 Personal history of transient ischemic attack (TIA), and cerebral infarction without residual deficits: Secondary | ICD-10-CM | POA: Diagnosis not present

## 2021-05-01 DIAGNOSIS — E1136 Type 2 diabetes mellitus with diabetic cataract: Secondary | ICD-10-CM | POA: Insufficient documentation

## 2021-05-01 DIAGNOSIS — F17210 Nicotine dependence, cigarettes, uncomplicated: Secondary | ICD-10-CM | POA: Diagnosis not present

## 2021-05-01 DIAGNOSIS — Z7984 Long term (current) use of oral hypoglycemic drugs: Secondary | ICD-10-CM | POA: Insufficient documentation

## 2021-05-01 DIAGNOSIS — Z79899 Other long term (current) drug therapy: Secondary | ICD-10-CM | POA: Diagnosis not present

## 2021-05-01 MED ORDER — NAPROXEN SODIUM 220 MG PO CAPS
1.0000 | ORAL_CAPSULE | ORAL | 0 refills | Status: DC | PRN
Start: 2021-05-01 — End: 2021-06-07

## 2021-05-01 MED ORDER — PRAVASTATIN SODIUM 80 MG PO TABS: 80.0000 mg | ORAL_TABLET | Freq: Every evening | ORAL | 3 refills | Status: DC

## 2021-05-01 NOTE — Patient Instructions (Addendum)
Continue weighing daily and call for an overnight weight gain of 3 pounds or more or a weekly weight gain of more than 5 pounds. ? ? ?If you have voicemail, please make sure your mailbox is cleaned out so that we may leave a message and please make sure to listen to any voicemails.  ? ? ?Stop taking meloxicam and take over the counter naproxyn if you need it for pain. You can take 1-2 tablets.  ? ? ?Finish your current pravastatin by taking 2 tablets daily. When you finish your current bottle and pick up the new prescription, you will then take 1 tablet daily because it'll be the higher dose.  ?

## 2021-05-03 DIAGNOSIS — I509 Heart failure, unspecified: Secondary | ICD-10-CM | POA: Diagnosis not present

## 2021-05-03 DIAGNOSIS — I5022 Chronic systolic (congestive) heart failure: Secondary | ICD-10-CM | POA: Diagnosis not present

## 2021-05-04 DIAGNOSIS — G4730 Sleep apnea, unspecified: Secondary | ICD-10-CM | POA: Diagnosis not present

## 2021-05-04 LAB — BASIC METABOLIC PANEL
BUN/Creatinine Ratio: 15 (ref 10–24)
BUN: 15 mg/dL (ref 8–27)
CO2: 26 mmol/L (ref 20–29)
Calcium: 9.8 mg/dL (ref 8.6–10.2)
Chloride: 102 mmol/L (ref 96–106)
Creatinine, Ser: 0.99 mg/dL (ref 0.76–1.27)
Glucose: 126 mg/dL — ABNORMAL HIGH (ref 70–99)
Potassium: 4.9 mmol/L (ref 3.5–5.2)
Sodium: 144 mmol/L (ref 134–144)
eGFR: 82 mL/min/{1.73_m2} (ref >59)

## 2021-05-07 ENCOUNTER — Telehealth: Payer: Self-pay | Admitting: *Deleted

## 2021-05-07 NOTE — Telephone Encounter (Addendum)
The patient has been notified of the result and verbalized understanding.  All questions (if any) were answered. ?Louis Gutierrez, Louis Gutierrez 05/07/2021 2:56 PM   ? ?Pt is aware of his normal result but he declined the PSG. Patient states he had a sleep study done at [facility] in [place] ordered by Dougherty walker.  ? ?Per Overton Mam, ?I ordered the Jaynie Crumble which he completed. Looks like he is seeing Advanced [facility] team in [place] and they likely did the alternate order. He had PSG from what it looks like so he'll have to follow up with Darylene Price, NP at [facility] regarding next steps. ? ?Patient wants dr Radford Pax to follow his sleep therapy and will contact Darylene Price to get [place] a copy of his sleep study. ?

## 2021-05-07 NOTE — Telephone Encounter (Signed)
-----   Message from Lauralee Evener, [place] sent at 04/25/2021  1:55 PM EDT ----- ? ?----- Message ----- ?From: Sueanne Margarita, MD ?Sent: 04/22/2021   1:46 PM EDT ?To: Cv Div Sleep Studies ? ?No OSA but has noctunral hypoxemia.  Please get an in lab PSG ? ?

## 2021-05-09 DIAGNOSIS — E119 Type 2 diabetes mellitus without complications: Secondary | ICD-10-CM | POA: Diagnosis not present

## 2021-05-09 DIAGNOSIS — J449 Chronic obstructive pulmonary disease, unspecified: Secondary | ICD-10-CM | POA: Diagnosis not present

## 2021-05-09 DIAGNOSIS — I25119 Atherosclerotic heart disease of native coronary artery with unspecified angina pectoris: Secondary | ICD-10-CM | POA: Diagnosis not present

## 2021-05-09 DIAGNOSIS — I5022 Chronic systolic (congestive) heart failure: Secondary | ICD-10-CM | POA: Diagnosis not present

## 2021-05-09 DIAGNOSIS — Z87891 Personal history of nicotine dependence: Secondary | ICD-10-CM | POA: Diagnosis not present

## 2021-05-09 DIAGNOSIS — I110 Hypertensive heart disease with heart failure: Secondary | ICD-10-CM | POA: Diagnosis not present

## 2021-05-09 DIAGNOSIS — F325 Major depressive disorder, single episode, in full remission: Secondary | ICD-10-CM | POA: Diagnosis not present

## 2021-05-16 ENCOUNTER — Other Ambulatory Visit: Payer: Self-pay | Admitting: Cardiovascular Disease

## 2021-05-19 ENCOUNTER — Encounter: Payer: Self-pay | Admitting: Cardiovascular Disease

## 2021-05-20 ENCOUNTER — Telehealth: Payer: Self-pay | Admitting: Family

## 2021-05-20 ENCOUNTER — Other Ambulatory Visit: Payer: Self-pay

## 2021-05-20 NOTE — Patient Outreach (Signed)
[facility]) Care Management ? ?05/20/2021 ? ?Louis Gutierrez ?1950/03/04 ?[id number] ? ? ?Telephone Assessment ? ? ?Successful outreach call to patient. Spoke with patient who reports he is doing well recently. Denies any new issues or concerns at present. Breathing has been managed and controlled. Denies any RN CM needs or concerns at this time. ? ? ? ?Medications Reviewed Today   ? ? Reviewed by Hayden Pedro, RN (Registered Nurse) on 05/20/21 at [phone]  Med List Status: <None>  ? ?Medication Order Taking? Sig Documenting Provider Last Dose Status Informant  ?acetaminophen (TYLENOL) 500 MG tablet [id number] No Take 1,000 mg by mouth every 8 (eight) hours as needed for mild pain.  [provider] Taking Active Self  ?aspirin 81 MG chewable tablet [id number] No Chew 81 mg by mouth daily. [provider] Taking Active Self  ?carvedilol (COREG) 6.25 MG tablet [id number] No Take 1 tablet (6.25 mg total) by mouth 2 (two) times daily. Alisa Graff, FNP Taking Active   ?clopidogrel (PLAVIX) 75 MG tablet [id number]  TAKE 1 TABLET(75 MG) BY MOUTH DAILY Croitoru, Mihai, MD  Active   ?COMBIVENT RESPIMAT 20-100 MCG/ACT AERS respimat [id number] No Inhale 1 puff into the lungs every 6 (six) hours as needed for wheezing. [provider] Taking Active Self  ?dapagliflozin propanediol (FARXIGA) 10 MG TABS tablet [id number] No Take 1 tablet (10 mg total) by mouth daily before breakfast. Alisa Graff, FNP Taking Active   ?ferrous sulfate 325 (65 FE) MG EC tablet [id number] No Take 1 tablet (325 mg total) by mouth in the morning and at bedtime. Croitoru, Mihai, MD Taking Active   ?furosemide (LASIX) 20 MG tablet [id number] No Take 1 tablet (20 mg total) by mouth daily. Loel Dubonnet, NP Taking Active   ?Naproxen Sodium 220 MG CAPS [id number]  Take 1 capsule (220 mg total) by mouth as needed. Darylene Price A, FNP  Active   ?pantoprazole (PROTONIX) 40 MG tablet [id number] No Take 40  mg by mouth daily. [provider] Taking Active Self  ?PARoxetine (PAXIL) 40 MG tablet [id number] No Take 40 mg by mouth daily.  [provider] Taking Active Self  ?         ?Med Note Fabio Bering   Tue Oct 07, 2016  6:39 PM)    ?pravastatin (PRAVACHOL) 80 MG tablet [id number]  Take 1 tablet (80 mg total) by mouth every evening. Darylene Price A, FNP  Active   ?sacubitril-valsartan (ENTRESTO) 97-103 MG [id number] No Take 1 tablet by mouth 2 (two) times daily. Alisa Graff, FNP Taking Active   ?spironolactone (ALDACTONE) 25 MG tablet [id number] No Take 0.5 tablets (12.5 mg total) by mouth daily. Loel Dubonnet, NP Taking Active   ?traZODone (DESYREL) 100 MG tablet [id number] No Take 50 mg by mouth at bedtime as needed for sleep.  [provider] Taking Active Self  ?         ?Med Note Fabio Bering   Tue Oct 07, 2016  6:39 PM)    ?varenicline (CHANTIX CONTINUING MONTH PAK) 1 MG tablet [id number] No As directed Alisa Graff, FNP Taking Active   ? ?  ?  ? ?  ? ?Care Plan : RN Care Manager POC  ?Updates made by Hayden Pedro, RN since 05/20/2021 12:00 AM  ?  ? ?Problem: Chronic Disease Mgmt of Chronic Conditions(COPD,DM)   ?Priority: High  ?  ? ?Long-Range Goal: Development of POC for Mgmt of  Chronic Conditions-COPD,DM   ?Start Date: 11/21/2020  ?Expected End Date: 11/21/2021  ?This Visit's Progress: On track  ?Recent Progress: On track  ?Priority: High  ?Note:   ? ?Current Barriers:  ?Chronic Disease Management support and education needs related to COPD and DMII ? ?RNCM Clinical Goal(s):  ?Patient will verbalize understanding of plan for management of COPD and DMII ?verbalize basic understanding of  COPD and DMII disease process and self health management plan for chronic conditions ?take all medications exactly as prescribed and will call provider for medication related questions ?demonstrate Ongoing adherence to prescribed treatment plan for COPD and DMII as  evidenced by lowering of A1C level and no minimal COPD exacerbations  through collaboration with RN Care manager, provider, and care team.  ? ?Interventions: ?POC sent to PCP upon initial assessment, quarterly and with any changes in patient's conditions ?Inter-disciplinary care team collaboration (see longitudinal plan of care) ?Evaluation of current treatment plan related to  self management and patient's adherence to plan as established by provider ? ? ?Diabetes Interventions:  (Status:  Goal on track:  Yes.) Long Term Goal ?Assessed patient's understanding of A1c goal: <7% ? ?Lab Results  ?Component Value Date  ? HGBA1C 6.4 (H) 12/22/2019  ?  ? ?Diabetes Interventions:  ?Patient reports an intentional wgt loss of about 12 lbs within the past 6 months or more as he verbalizes wgt loss with positively affect his overall health in a good way. ?Assessed patient's understanding of A1c goal: <7% ?Provided education to patient about basic DM disease process; ?Reviewed medications with patient and discussed importance of medication adherence; ?Praised patient for lowering of A1C level based on most recent results (previous A1C 6.5-April 2022) ?Lab Results  ?Component Value Date  ? HGBA1C 6.4 (H) 12/22/2019  ?05/20/2021-Wgt remains stable. Appetite reported as good to patient.  ? ? ?COPD Interventions:  (Status:  Goal on track:  Yes.) Long Term Goal ? ? ?COPD Interventions:  Patient reports he has noticed a slight change in his breathing pattern since weather/season has changed. He is using inhalers as ordered with good relief and avoiding triggers. ?Advised patient to track and manage COPD triggers;  ?Advised patient to self assesses COPD action plan zone and make appointment with provider if in the yellow zone for 48 hours without improvement; ?Assessed for vaccination/immunization status -patient received flu shot at PCP appt-11/01/20 ?02/21/21-Patient reports that his breathing has been fair. Weather does trigger some  issues at times but sxs managed. ?3/6/2-Patient has recent hospital admission for COPD exacerbation. However, he reports since returning home his sxs have improved and he is doing great. He has meds and oxygen in the home(only using prn). Denies any resp issues at present.  ?05/20/21-Patient reports he has quit smoking-it has been a week since last cigarette. He took med for about three does but otherwise has done it cold [place]. He has noticed improvement in his breathing. He has sleep study done but still awaiting results. Patient continues to only use oxygen when sleeping.  ?Patient Goals/Self-Care Activities: ?Patient will self administer medications as prescribed ?Patient will attend all scheduled provider appointments ?Patient will call provider office for new concerns or questions ?Patient will continue to monitor cbgs in the home and adhere to diabetic diet ?Patient will have no exacerbation of sxs and unplanned hospital admission over the next 90 days ? ? ? ?Follow Up Plan:  Telephone follow up appointment with care management team member scheduled for:  quarterly-in the month of Aug ?The  patient has been provided with contact information for the care management team and has been advised to call with any health related questions or concerns.   ?  ?  ? ?RN CM discussed with patient next outreach within the month of Aug.Patient agrees to care plan and follow up. ? ?Enzo Montgomery, RN,BSN,CCM ?[facility] Care Management ?Telephonic Care Management Coordinator ?Direct Phone: [phone] ?Toll Free: [phone] ?Fax: [phone] ? ?

## 2021-05-20 NOTE — Telephone Encounter (Signed)
Patient had called asking if we had received his results from his sleep study done on 05/06/21. Advised patient that we had not received them yet. Called [facility] and had to leave a message asking for the results to be sent to [place].  ?

## 2021-05-21 ENCOUNTER — Telehealth: Payer: Self-pay

## 2021-05-21 NOTE — Telephone Encounter (Addendum)
Patient instructed with message below with sleep study results, and the need to call the sleep lab to schedule appointment as soon as possible. Pt verbalized understanding. He states he will call the sleep lab and had no further questions at this time.  ?Georg Ruddle, RN ?----- Message from Alisa Graff, [place] sent at 05/21/2021 11:32 AM EDT ----- ?Regarding: sleep study results ?Please let patient know we received his sleep study results. Due to frequent pauses in his breathing, low oxygen level, frequent leg movement while asleep and snoring, it is recommended he return to the sleep lab for titration of CPAP equipment. He needs to call the sleep lab to schedule this.  ? ? ?

## 2021-05-30 ENCOUNTER — Telehealth: Payer: Self-pay | Admitting: *Deleted

## 2021-05-30 ENCOUNTER — Ambulatory Visit (INDEPENDENT_AMBULATORY_CARE_PROVIDER_SITE_OTHER): Payer: Medicare HMO | Admitting: Cardiovascular Disease

## 2021-05-30 ENCOUNTER — Other Ambulatory Visit: Payer: Self-pay | Admitting: Family

## 2021-05-30 ENCOUNTER — Encounter: Payer: Self-pay | Admitting: Cardiovascular Disease

## 2021-05-30 ENCOUNTER — Other Ambulatory Visit: Payer: Self-pay | Admitting: *Deleted

## 2021-05-30 VITALS — BP 108/66 | HR 66 | Ht 71.0 in | Wt 189.4 lb

## 2021-05-30 DIAGNOSIS — I5042 Chronic combined systolic (congestive) and diastolic (congestive) heart failure: Secondary | ICD-10-CM | POA: Diagnosis not present

## 2021-05-30 DIAGNOSIS — J441 Chronic obstructive pulmonary disease with (acute) exacerbation: Secondary | ICD-10-CM

## 2021-05-30 DIAGNOSIS — R7303 Prediabetes: Secondary | ICD-10-CM

## 2021-05-30 DIAGNOSIS — I447 Left bundle-branch block, unspecified: Secondary | ICD-10-CM

## 2021-05-30 DIAGNOSIS — I2510 Atherosclerotic heart disease of native coronary artery without angina pectoris: Secondary | ICD-10-CM

## 2021-05-30 DIAGNOSIS — F325 Major depressive disorder, single episode, in full remission: Secondary | ICD-10-CM

## 2021-05-30 DIAGNOSIS — Z8673 Personal history of transient ischemic attack (TIA), and cerebral infarction without residual deficits: Secondary | ICD-10-CM | POA: Diagnosis not present

## 2021-05-30 DIAGNOSIS — I10 Essential (primary) hypertension: Secondary | ICD-10-CM

## 2021-05-30 DIAGNOSIS — E785 Hyperlipidemia, unspecified: Secondary | ICD-10-CM

## 2021-05-30 DIAGNOSIS — I5022 Chronic systolic (congestive) heart failure: Secondary | ICD-10-CM | POA: Diagnosis not present

## 2021-05-30 MED ORDER — SODIUM CHLORIDE 0.9% FLUSH: 3.0000 mL | Freq: Two times a day (BID) | INTRAVENOUS | Status: DC

## 2021-05-30 NOTE — H&P (View-Only) (Signed)
Cardiology Office Note    Date:  05/30/2021   ID:  Louis Gutierrez, DOB 06-02-1950, MRN [id number]  PCP:  Louis Ruths, MD  Cardiologist:   Louis Klein, MD   No chief complaint on file.   History of Present Illness:  Louis Gutierrez is a 71 y.o. male with coronary artery disease and a history of cryptogenic stroke.    Patient presented with inferior wall STEMI in 2012 when he underwent placement of a drug-eluting stent to the RCA for total occlusion (2.5 x 24 mm Promus), and was also found to have moderate stenosis in the LAD, first diagonal artery and subclavian artery as well as chronic total occlusion of the small nondominant left circumflex coronary artery.  His EF has been mildly decreased at about 50% ever since.  He had an unexplained stroke with right hemiparesis and expressive aphasia in 2015.  Loop recorder did not show evidence of atrial fibrillation throughout its entire 3-year operation.  He was admitted to [facility] in early March with severe shortness of breath and was treated for both heart failure and COPD exacerbation (steroids, bronchodilators, furosemide).  Echocardiogram performed that time showed LVEF 35-40% with grade 1 diastolic dysfunction.  BNP 197  February 28, decreased to 60 on March 31.  He has had chronic shortness of breath which we have attributed to COPD since it was associated with a chronic cough and wheezing.  However this is much worse since his last hospitalization.  He becomes easily short of breath, NYHA functional class II, but hard to perform his work duties.  He wants to keep on working.  He has not had any chest pain at rest or with activity.  He does not appear to describe orthopnea or PND and has not had lower extremity edema.  He is limited by shortness of breath more than fatigue.  He has not had dizziness or syncope.  He has been started on treatment with Entresto but was not told to stop his ramipril.  He is also  taking carvedilol, Farxiga and spironolactone.  He is on aspirin and clopidogrel both for CAD and history of unexplained stroke.  He is on maximum dose pravastatin and his most recent LDL cholesterol was recently 45.  Glycemic control is good with a hemoglobin A1c of 6.3%.  He has normal renal function.  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  He says his employer let him do easy jobs only.  He does not want to give up working.  However he started crying during her appointment.  He feels that his abilities have been severely decreased and is very pessimistic about his future.  He tells me that at times he thinks that he is better off "if it is all over".  He denies suicidal thoughts.   He had a screening CT lung cancer which showed coronary artery calcifications (not surprising) and gallstones (asymptomatic).  Since this cryptogenic stroke, he has an implantable loop recorder that has reached end of service and is left in place.  During 3 years of monitoring that did not show atrial fibrillation.  He has not had any new neurological events.  His loop recorder has reached end of service.  He presented with ST segment elevation myocardial infarction in May of 2012. At that time had an inferior wall infarction, manifesting as unstable angina pectoris and eventually a syncopal episode. The right coronary artery was totally occluded and revascularization was difficult but eventually he  received a 3.5 x 24 mm Promus drug-eluting stent to the mid RCA. Also noted was 50% ostial stenosis of the first diagonal artery and 40% ostial stenosis of the second diagonal artery branches of the LAD, as well as total occlusion of a small left circumflex system. There was evidence of inferolateral hypokinesis and ejection fraction of 45-50% at that time. He presented with chest pain to [facility] in 2015 when he had a repeat coronary angiogram that was reportedly normal. I don't have that report. In May 2015  he presented with right hemiparesis and expressive aphasia with an infarction in the territory of the left middle cerebral artery. An etiology was not identified. He underwent loop recorder implantation in August 2015 and in 3 years the device has not recorded any arrhythmia. His most recent echo performed in March 2015 showed left ventricular ejection fraction of 50-55 percent with abnormal relaxation and no regional wall motion abnormalities. Both atria were described as normal in size and there were no serious valvular abnormalities.  Identical findings recorded in September 2018 echo, again EF 50-55% and the left atrium was described as normal in size.  During the hospitalization for acute shortness of breath at [facility] in March 2023 LVEF was estimated at 35-40%.  [name] and [place] were initiated.    Past Medical History:  Diagnosis Date   Adrenal adenoma, left    Anxiety    Aortic atherosclerosis (HCC)    Asthma without status asthmaticus    Atherosclerotic cerebrovascular disease    Overview:  Small and facial weakness with minimal weakness    CAD (coronary artery disease)    a. 05/2010 Inf STEMI/PCI: RCA 125m(3.5x24 Promus DES), LCX small, 100, D1 50ost, D2 40ost, EF 45-50%; b. 03/2013 Cath ([facility]): 40% mLAD, 20% stenosis mRCA (site of the previous stent); LVEF 55%.   Cervical disc disease    a. s/p C3-4 fusion 2003.   Cervical myelopathy ([facility]    a. 09/2016 Admit w/ left sided wkns->found to have progressive cervical disc dzs above and below prior fusioin site; b. 10/2016 s/p ant cervical diskectomy.   COPD (chronic obstructive pulmonary disease) ([place]    Cryptogenic stroke ([facility] 05/2013    s/p acute L MCA territory infarct; b. 07/2013 s/p MDT [place] ILR-->No AFib to documented to date (09/2016).   Current use of long term anticoagulation    DAPT therapy (ASA + clopidogrel)   Depression    Diabetes mellitus without complication (HCC)    Essential (primary) hypertension    Gastritis     GERD (gastroesophageal reflux disease)    H/O insomnia    History of cerebrovascular accident (CVA) with residual deficit 2015   facial drooping on one side of face is only residual since stroke   IDA (iron deficiency anemia)    Inguinal hernia    Insomnia disorder    Ischemic cardiomyopathy    a. 05/2010 LV gram: EF 45-50% @ time of inf MI; b. 09/2016 Echo: EF 50-55%, inf HK, Gr1 DD, mild MR.   Mixed hyperlipidemia    Nephrolithiasis    Obesity    OSA (obstructive sleep apnea)    no cpap   Osteoarthritis    STEMI (ST elevation myocardial infarction) ([place] 06/07/2010   1 stent   Tobacco abuse    Urinary incontinence    Varicose veins    Vertigo     Past Surgical History:  Procedure Laterality Date   ANTERIOR CERVICAL DECOMP/DISCECTOMY FUSION N/A 10/22/2016  Procedure: ANTERIOR CERVICAL DECOMPRESSION/DISCECTOMY FUSION 2 LEVELS C3-4, C6-7;  Surgeon: Meade Maw, MD;  Location: [facility] ORS;  Service: Neurosurgery;  Laterality: N/A;   CARDIAC CATHETERIZATION  06/07/2010   50 % ostial narrowing in 1st diagonal from LAD, 2nd diagonal had 40% ostial narrowing; AV groove circumflex was diminutive and occluded proximally; RCA totally occluded in mid segment - stented with 3.5 x 27m Promus Element DES  - See Media tab   CARDIAC CATHETERIZATION  04/04/2013   40% mLAD, 20% mRCA at the site of the previous stent.  LVEF was 55%.   COLONOSCOPY WITH PROPOFOL N/A 02/26/2015   Procedure: COLONOSCOPY WITH PROPOFOL;  Surgeon: MJosefine Class MD;  [facility];  Service: Endoscopy;  Laterality: N/A;   COLONOSCOPY WITH PROPOFOL N/A 03/16/2020   Procedure: COLONOSCOPY WITH PROPOFOL;  Surgeon: LLesly Rubenstein MD;  Location: [facility] ENDOSCOPY;  Service: Endoscopy;  Laterality: N/A;   ESOPHAGOGASTRODUODENOSCOPY  02/26/2015   Procedure: ESOPHAGOGASTRODUODENOSCOPY (EGD);  Surgeon: MJosefine Class MD;  [facility];  Service: Endoscopy;;   ESOPHAGOGASTRODUODENOSCOPY (EGD)  WITH PROPOFOL N/A 03/16/2020   Procedure: ESOPHAGOGASTRODUODENOSCOPY (EGD) WITH PROPOFOL;  Surgeon: LLesly Rubenstein MD;  Location: [facility] ENDOSCOPY;  Service: Endoscopy;  Laterality: N/A;   EYE SURGERY     cataract replaced   LOOP RECORDER IMPLANT  08/16/13   Dr. CSallyanne Kuster  XI ROBOTIC ASSISTED INGUINAL HERNIA REPAIR WITH MESH Left 05/23/2020   Procedure: [place]IC [place]  Surgeon: CHerbert Pun MD;  Location: [facility] ORS;  Service: General;  Laterality: Left;    Current Medications: Outpatient Medications Prior to Visit  Medication Sig Dispense Refill   aspirin 81 MG chewable tablet Chew 81 mg by mouth daily.     carvedilol (COREG) 6.25 MG tablet Take 1 tablet (6.25 mg total) by mouth 2 (two) times daily. 60 tablet 3   clopidogrel (PLAVIX) 75 MG tablet TAKE 1 TABLET(75 MG) BY MOUTH DAILY 90 tablet 3   COMBIVENT RESPIMAT 20-100 MCG/ACT AERS respimat Inhale 1 puff into the lungs every 6 (six) hours as needed for wheezing.     dapagliflozin propanediol (FARXIGA) 10 MG TABS tablet Take 1 tablet (10 mg total) by mouth daily before breakfast. 30 tablet 0   ferrous sulfate 325 (65 FE) MG EC tablet Take 1 tablet (325 mg total) by mouth in the morning and at bedtime. 60 tablet 11   furosemide (LASIX) 20 MG tablet Take 1 tablet (20 mg total) by mouth daily. 90 tablet 3   NICOTROL NS 10 MG/ML SOLN Place into both nostrils as needed.     pantoprazole (PROTONIX) 40 MG tablet Take 40 mg by mouth daily.     PARoxetine (PAXIL) 40 MG tablet Take 40 mg by mouth daily.      pravastatin (PRAVACHOL) 80 MG tablet Take 1 tablet (80 mg total) by mouth every evening. 90 tablet 3   sacubitril-valsartan (ENTRESTO) 97-103 MG Take 1 tablet by mouth 2 (two) times daily. 60 tablet 5   spironolactone (ALDACTONE) 25 MG tablet Take 0.5 tablets (12.5 mg total) by mouth daily. 45 tablet 3   traZODone (DESYREL) 100 MG tablet Take 50 mg by mouth at bedtime as needed for sleep.       ramipril (ALTACE) 10 MG capsule Take 10 mg by mouth daily in the afternoon.     acetaminophen (TYLENOL) 500 MG tablet Take 1,000 mg by mouth every 8 (eight) hours as needed for mild pain.  (Patient not taking:  Reported on 05/30/2021)     Blood Glucose Monitoring Suppl (TRUE METRIX AIR GLUCOSE METER) w/Device KIT  (Patient not taking: Reported on 05/30/2021)     Naproxen Sodium 220 MG CAPS Take 1 capsule (220 mg total) by mouth as needed. (Patient not taking: Reported on 05/30/2021) 60 capsule 0   varenicline (CHANTIX CONTINUING MONTH PAK) 1 MG tablet As directed (Patient not taking: Reported on 05/30/2021) 42 tablet 0   No facility-administered medications prior to visit.     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  Tobacco Use   Smoking status: Every Day    Packs/day: 1.00    Years: 53.00    Pack years: 53.00    Types: Cigarettes    Last attempt to quit: 11/14/2019    Years since quitting: 1.5   Smokeless tobacco: Never   Tobacco comments:    Down to 0.25 packs per day  Vaping Use   Vaping Use: Some days   Start date: 11/14/2019   Substances: Nicotine  Substance and Sexual Activity   Alcohol use: No   Drug use: No   Sexual activity: Not on file  Other Topics Concern   Not on file  Social History Narrative   Lives in [place] with wife and two daughters. Feels safe in his home.   Walks dog 30 mins 2x/day w/o difficulties.      Continues to work part time.   Social Determinants of Health   Financial Resource Strain: Not on file  Food Insecurity: No Food Insecurity   Worried About Charity fundraiser in the Last Year: Never true   Ran Out of Food in the Last Year:  Never true  Transportation Needs: No Transportation Needs   Lack of Transportation (Medical): No   Lack of Transportation (Non-Medical): No  Physical Activity: Not on file  Stress: Not on file  Social Connections: Not on file     Family History:  The patient's family history includes Cancer in his mother; Coronary artery disease in his brother; Diabetes in his brother; Stroke in his maternal grandmother.   ROS:   Please see the history of present illness.    ROS All other systems are reviewed and are negative.   PHYSICAL EXAM:   VS:  BP 108/66 (BP Location: Left Arm, Patient Position: Sitting, Cuff Size: Normal)   Pulse 66   Ht _0  (1.803 m)   Wt 189 lb 6.4 oz (85.9 kg)   SpO2 90%   BMI 26.42 kg/m      General: Alert, oriented x3, no distress, mildly obese Head: no evidence of trauma, PERRL, EOMI, no exophtalmos or lid lag, no myxedema, no xanthelasma; normal ears, nose and oropharynx Neck: normal jugular venous pulsations and no hepatojugular reflux; brisk carotid pulses without delay and no carotid bruits Chest: clear to auscultation, no signs of consolidation by percussion or palpation, normal fremitus, symmetrical and full respiratory excursions Cardiovascular: normal position and quality of the apical impulse, regular rhythm, normal first and second heart sounds, no murmurs, rubs or gallops Abdomen: no tenderness or distention, no masses by palpation, no abnormal pulsatility  or arterial bruits, normal bowel sounds, no hepatosplenomegaly Extremities: no clubbing, cyanosis or edema; 2+ radial, ulnar and brachial pulses bilaterally; 2+ right femoral, posterior tibial and dorsalis pedis pulses; 2+ left femoral, posterior tibial and dorsalis pedis pulses; no subclavian or femoral bruits Neurological: grossly nonfocal Psych: Normal mood and affect     Wt Readings from Last 3 Encounters:  05/30/21 189 lb 6.4 oz (85.9 kg)  05/01/21 190 lb 2 oz (86.2 kg)  04/19/21 194 lb 8 oz  (88.2 kg)      Studies/Labs Reviewed:   Echocardiogram 03/20/2021:    1. Left ventricular ejection fraction, by estimation, is 35 to 40%. The  left ventricle has moderately decreased function. The left ventricle  demonstrates regional wall motion abnormalities (see scoring  diagram/findings for description). Left ventricular   diastolic parameters are consistent with Grade I diastolic dysfunction  (impaired relaxation). There is LV global hypokinesis with inferior and  inferolateral wall akinesis.   2. Right ventricular systolic function is normal. The right ventricular  size is normal.   3. The mitral valve is normal in structure. Mild mitral valve  regurgitation.   4. The aortic valve is tricuspid. Aortic valve regurgitation is not  visualized. Aortic valve sclerosis is present, with no evidence of aortic  valve stenosis.   5. Aortic dilatation noted. There is mild dilatation of the aortic root,  measuring 40 mm.   6. The inferior vena cava is normal in size with <50% respiratory  variability, suggesting right atrial pressure of 8 mmHg.   EKG:  EKG is not ordered today.  The ECG from 04/19/2021 shows sinus rhythm with a couple of PVCs, first-degree AV block, very broad left bundle branch block (186 ms) with left axis deviation (QTc 498 ms).  Recent Labs:     Latest Ref Rng & Units 05/03/2021    1:09 PM 04/19/2021   11:26 AM 03/22/2021    6:30 AM  BMP  Glucose 70 - 99 mg/dL 126   125   137    BUN 8 - 27 mg/dL 15   16   44    Creatinine 0.76 - 1.27 mg/dL 0.99   1.05   1.00    BUN/Creat Ratio 10 - _0 Sodium 134 - 144 mmol/L 144   141   139    Potassium 3.5 - 5.2 mmol/L 4.9   4.2   3.7    Chloride 96 - 106 mmol/L 102   99   97    CO2 20 - 29 mmol/L 26   27   34    Calcium 8.6 - 10.2 mg/dL 9.8   9.6   9.2        Lipid Panel    Component Value Date/Time   CHOL 115 12/22/2019 0908   CHOL 110 05/22/2013 0526   TRIG 66 12/22/2019 0908   TRIG 162 05/22/2013 0526    HDL 46 12/22/2019 0908   HDL 17 (L) 05/22/2013 0526   CHOLHDL 2.5 12/22/2019 0908   CHOLHDL 3.1 10/08/2016 0507   VLDL 17 10/08/2016 0507   VLDL 32 05/22/2013 0526   LDLCALC 55 12/22/2019 0908   LDLCALC 61 05/22/2013 0526   LDLDIRECT 67 04/19/2021 1126   04/25/2021 Hemoglobin A1c 6.3%  ASSESSMENT:    1. Essential hypertension   2. Chronic systolic congestive heart failure (HCC)      PLAN:  In order of problems listed above:  CHF: LV function  has deteriorated.  This may be due to interval occlusion of the native right coronary artery.  If this is the case we may need to perform a viability study with a PET scan before performing revascularization.  However, need to make sure that he does not have more widespread progression of CAD.  Also he does not clear to me whether his current shortness of breath is due to heart failure (there are no signs of hypervolemia on physical exam, most recent BMP was very low) or due to COPD.  Recommend right and left heart catheterization.  This procedure has been fully reviewed with the patient and written informed consent has been obtained.  Scheduled with Dr. Fletcher Anon in [place].  He should not be taking ACE inhibitor while on Entresto.  Stop ramipril.  Continue carvedilol, spironolactone, [place].  Based on findings of right heart catheterization we may decide to alter his furosemide prescription. CAD: He does not have angina pectoris.  There has been however significant reduction in LV function.  Regional wall motion abnormality suggest that he may have now occluded the right coronary artery.  He has known chronic total occlusion of the circumflex and had a moderate stenosis in the LAD artery.  Recommend repeat coronary angiography.  Continue aspirin, statin, beta-blocker.  He is also on clopidogrel due to his history of stroke.  He should continue this. LBBB: He has an extremely broad QRS complex with typical left bundle morphology and a long  first-degree AV block.  Even though he has ischemic cardiomyopathy, he may still benefit from CRT-P.  We will reevaluate this after we decide whether or not he needs revascularization. Hx cryptogenic ischemic stroke: This has not recurred in about 8 years.  He did not have atrial fibrillation during 3 years of implantable loop recorder monitoring. HLP/prediabetes: LDL cholesterol level on current statin.  Continue Farxiga for both diabetes and heart failure. HTN: Well-controlled. COPD/history smoking: Quitting smoking probably contributed to his weight gain, but it is still a net beneficial intervention. Depression: History of major depression I think his recent illness has led to exacerbation of this.  Louis Gutierrez denies suicidal ideation or intent.    Medication Adjustments/Labs and Tests Ordered: Current medicines are reviewed at length with the patient today.  Concerns regarding medicines are outlined above.  Medication changes, Labs and Tests ordered today are listed in the Patient Instructions below. Patient Instructions  Medication Instructions:  STOP the Ramipril  *If you need a refill on your cardiac medications before your next appointment, please call your pharmacy*  Testing/Procedures: Your physician has requested that you have a cardiac catheterization. Cardiac catheterization is used to diagnose and/or treat various heart conditions. Doctors may recommend this procedure for a number of different reasons. The most common reason is to evaluate chest pain. Chest pain can be a symptom of coronary artery disease (CAD), and cardiac catheterization can show whether plaque is narrowing or blocking your heart's arteries. This procedure is also used to evaluate the valves, as well as measure the blood flow and oxygen levels in different parts of your heart. For further information please visit [email or web address]. Please follow instruction sheet, as given.   Follow-Up: At [facility], you and  your health needs are our priority.  As part of our continuing mission to provide you with exceptional heart care, we have created designated Provider Care Teams.  These Care Teams include your primary Cardiologist (physician) and Advanced Practice Providers (APPs -  Physician Assistants and Nurse Practitioners) who all  work together to provide you with the care you need, when you need it.  We recommend signing up for the patient portal called "MyChart".  Sign up information is provided on this After Visit Summary.  MyChart is used to connect with patients for Virtual Visits (Telemedicine).  Patients are able to view lab/test results, encounter notes, upcoming appointments, etc.  Non-urgent messages can be sent to your provider as well.   To learn more about what you can do with MyChart, go to [email or web address].    Your next appointment:   Keep your post procedure follow up with Dr. Sallyanne Kuster on 6/6 at 1:40 pm  If primary card or EP is not listed click here to update    :1}    Other Instructions   [facility] [address] [place] [address] Dept: [phone] Loc: [place] are scheduled for a Cardiac Catheterization. We will call you with the date and time.  1. Arrive at the [place] entrance at TBD, one hour prior to your procedure. Free valet service is available.  After entering the [facility] please check-in at the registration desk (1st desk on your right) to receive your armband. After receiving your armband someone will escort you to the cardiac cath/special procedures waiting area.  If you have any questions, please call our office at [phone], or you may call the cardiac cath lab at [facility] directly at [phone]   Special note: Every effort is made to have your procedure done on time. Please understand that emergencies sometimes delay scheduled procedures.    2. Diet: Do not eat solid foods after midnight.  The patient may have clear liquids until 5am upon the day of the procedure.  3. Labs: You will need to have blood drawn on 05/30/21.  4. Medication instructions in preparation for your procedure: Hold the Spironolactone the morning of the procedure Hold the Furosemide the morning of the procedure  On the morning of your procedure, take your Aspirin and Plavix/Clopidogrel and any morning medicines NOT listed above.  You may use sips of water.  5. Plan for one night stay--bring personal belongings. 6. Bring a current list of your medications and current insurance cards. 7. You MUST have a responsible person to drive you home. 8. Someone MUST be with you the first 24 hours after you arrive home or your discharge will be delayed. 9. Please wear clothes that are easy to get on and off and wear slip-on shoes.  Thank you for allowing [place] to care for you!   -- [facility] Invasive Cardiovascular services    Signed, Louis Klein, MD  05/30/2021 1:17 PM    [address], [place], [place]  [address] Phone: [phone]; Fax: [phone]   Shared Decision Making/Informed Consent The risks [stroke (1 in 1000), death (1 in 1000), kidney failure [usually temporary] (1 in 500), bleeding (1 in 200), allergic reaction [possibly serious] (1 in 200)], benefits (diagnostic support and management of coronary artery disease) and alternatives of a cardiac catheterization were discussed in detail with Louis Gutierrez and he is willing to proceed.

## 2021-05-30 NOTE — Progress Notes (Signed)
? ?Cardiology Office Note   ? ?Date:  05/30/2021  ? ?ID:  Louis Gutierrez, DOB May 27, 1950, MRN [id number] ? ?PCP:  Kirk Ruths, MD  ?Cardiologist:   Sanda Klein, MD  ? ?No chief complaint on file. ? ? ?History of Present Illness:  ?Louis Gutierrez is a 71 y.o. male with coronary artery disease and a history of cryptogenic stroke.   ? ?Patient presented with inferior wall STEMI in 2012 when he underwent placement of a drug-eluting stent to the RCA for total occlusion (2.5 x 24 mm Promus), and was also found to have moderate stenosis in the LAD, first diagonal artery and subclavian artery as well as chronic total occlusion of the small nondominant left circumflex coronary artery.  His EF has been mildly decreased at about 50% ever since. ? ?He had an unexplained stroke with right hemiparesis and expressive aphasia in 2015.  Loop recorder did not show evidence of atrial fibrillation throughout its entire 3-year operation. ? ?He was admitted to [facility] in early March with severe shortness of breath and was treated for both heart failure and COPD exacerbation (steroids, bronchodilators, furosemide).  Echocardiogram performed that time showed LVEF 35-40% with grade 1 diastolic dysfunction.  BNP 197  February 28, decreased to 60 on March 31. ? ?He has had chronic shortness of breath which we have attributed to COPD since it was associated with a chronic cough and wheezing.  However this is much worse since his last hospitalization.  He becomes easily short of breath, NYHA functional class II, but hard to perform his work duties.  He wants to keep on working.  He has not had any chest pain at rest or with activity.  He does not appear to describe orthopnea or PND and has not had lower extremity edema.  He is limited by shortness of breath more than fatigue.  He has not had dizziness or syncope. ? ?He has been started on treatment with Entresto but was not told to stop his ramipril.  He is also  taking carvedilol, Farxiga and spironolactone.  He is on aspirin and clopidogrel both for CAD and history of unexplained stroke.  He is on maximum dose pravastatin and his most recent LDL cholesterol was recently 59.  Glycemic control is good with a hemoglobin A1c of 6.3%.  He has normal renal function. ? ?He continues to try to work [employer].  He says his employer let him do easy jobs only.  He does not want to give up working.  However he started crying during her appointment.  He feels that his abilities have been severely decreased and is very pessimistic about his future.  He tells me that at times he thinks that he is better off "if it is all over".  He denies suicidal thoughts. ? ? ?He had a screening CT lung cancer which showed coronary artery calcifications (not surprising) and gallstones (asymptomatic).  Since this cryptogenic stroke, he has an implantable loop recorder that has reached end of service and is left in place.  During 3 years of monitoring that did not show atrial fibrillation.  He has not had any new neurological events.  His loop recorder has reached end of service. ? ?He presented with ST segment elevation myocardial infarction in May of 2012. At that time had an inferior wall infarction, manifesting as unstable angina pectoris and eventually a syncopal episode. The right coronary artery was totally occluded and revascularization was difficult but eventually he  received a 3.5 x 24 mm Promus drug-eluting stent to the mid RCA. Also noted was 50% ostial stenosis of the first diagonal artery and 40% ostial stenosis of the second diagonal artery branches of the LAD, as well as total occlusion of a small left circumflex system. There was evidence of inferolateral hypokinesis and ejection fraction of 45-50% at that time. He presented with chest pain to [facility] in 2015 when he had a repeat coronary angiogram that was reportedly normal. I don't have that report. In May 2015  he presented with right hemiparesis and expressive aphasia with an infarction in the territory of the left middle cerebral artery. An etiology was not identified. He underwent loop recorder implantation in August 2015 and in 3 years the device has not recorded any arrhythmia. His most recent echo performed in March 2015 showed left ventricular ejection fraction of 50-55 percent with abnormal relaxation and no regional wall motion abnormalities. Both atria were described as normal in size and there were no serious valvular abnormalities.  Identical findings recorded in September 2018 echo, again EF 50-55% and the left atrium was described as normal in size. ? ?During the hospitalization for acute shortness of breath at [facility] in March 2023 LVEF was estimated at 35-40%.  [name] and Arabi were initiated. ?  ? ?Past Medical History:  ?Diagnosis Date  ? Adrenal adenoma, left   ? Anxiety   ? Aortic atherosclerosis ([place])   ? Asthma without status asthmaticus   ? Atherosclerotic cerebrovascular disease   ? Overview:  Small and facial weakness with minimal weakness   ? CAD (coronary artery disease)   ? a. 05/2010 Inf STEMI/PCI: RCA 180m(3.5x24 Promus DES), LCX small, 100, D1 50ost, D2 40ost, EF 45-50%; b. 03/2013 Cath ([facility]): 40% mLAD, 20% stenosis mRCA (site of the previous stent); LVEF 55%.  ? Cervical disc disease   ? a. s/p C3-4 fusion 2003.  ? [place]ervical myelopathy ([place]   ? a. 09/2016 Admit w/ left sided wkns->found to have progressive cervical disc dzs above and below prior fusioin site; b. 10/2016 s/p ant cervical diskectomy.  ? COPD (chronic obstructive pulmonary disease) ([place]   ? Cryptogenic stroke ([place] 05/2013  ?  s/p acute L MCA territory infarct; b. 07/2013 s/p MDT Linq ILR-->No AFib to documented to date (09/2016).  ? Current use of long term anticoagulation   ? DAPT therapy (ASA + clopidogrel)  ? Depression   ? Diabetes mellitus without complication ([place]   ? Essential (primary) hypertension   ? Gastritis    ? GERD (gastroesophageal reflux disease)   ? H/O insomnia   ? History of cerebrovascular accident (CVA) with residual deficit 2015  ? facial drooping on one side of face is only residual since stroke  ? IDA (iron deficiency anemia)   ? Inguinal hernia   ? Insomnia disorder   ? Ischemic cardiomyopathy   ? a. 05/2010 LV gram: EF 45-50% @ time of inf MI; b. 09/2016 Echo: EF 50-55%, inf HK, Gr1 DD, mild MR.  ? Mixed hyperlipidemia   ? Nephrolithiasis   ? Obesity   ? OSA (obstructive sleep apnea)   ? no cpap  ? Osteoarthritis   ? STEMI (ST elevation myocardial infarction) ([place] 06/07/2010  ? 1 stent  ? Tobacco abuse   ? Urinary incontinence   ? Varicose veins   ? Vertigo   ? ? ?Past Surgical History:  ?Procedure Laterality Date  ? ANTERIOR CERVICAL DECOMP/DISCECTOMY FUSION N/A 10/22/2016  ?  Procedure: ANTERIOR CERVICAL DECOMPRESSION/DISCECTOMY FUSION 2 LEVELS C3-4, C6-7;  Surgeon: Meade Maw, MD;  Location: [facility] ORS;  Service: Neurosurgery;  Laterality: N/A;  ? CARDIAC CATHETERIZATION  06/07/2010  ? 50 % ostial narrowing in 1st diagonal from LAD, 2nd diagonal had 40% ostial narrowing; AV groove circumflex was diminutive and occluded proximally; RCA totally occluded in mid segment - stented with 3.5 x 1m Promus Element DES  - See Media tab  ? CARDIAC CATHETERIZATION  04/04/2013  ? 40% mLAD, 20% mRCA at the site of the previous stent.  LVEF was 55%.  ? COLONOSCOPY WITH PROPOFOL N/A 02/26/2015  ? Procedure: COLONOSCOPY WITH PROPOFOL;  Surgeon: MJosefine Class MD;  [facility];  Service: Endoscopy;  Laterality: N/A;  ? COLONOSCOPY WITH PROPOFOL N/A 03/16/2020  ? Procedure: COLONOSCOPY WITH PROPOFOL;  Surgeon: LLesly Rubenstein MD;  [facility];  Service: Endoscopy;  Laterality: N/A;  ? ESOPHAGOGASTRODUODENOSCOPY  02/26/2015  ? Procedure: ESOPHAGOGASTRODUODENOSCOPY (EGD);  Surgeon: MJosefine Class MD;  [facility];  Service: Endoscopy;;  ? ESOPHAGOGASTRODUODENOSCOPY (EGD)  WITH PROPOFOL N/A 03/16/2020  ? Procedure: ESOPHAGOGASTRODUODENOSCOPY (EGD) WITH PROPOFOL;  Surgeon: LLesly Rubenstein MD;  Location: [facility] ENDOSCOPY;  Service: Endoscopy;  Laterality: N/A;  ? EYE SURG

## 2021-05-30 NOTE — Telephone Encounter (Signed)
Spoke to the patient to give him the time of the Right and Left Heart Cath at [facility] with Dr. Fletcher Anon. The patient will arrive by 9:30 am on 5/19. Labs were completed today at his office visit.  ? ?

## 2021-05-30 NOTE — Patient Instructions (Signed)
Medication Instructions:  ?STOP the Ramipril ? ?*If you need a refill on your cardiac medications before your next appointment, please call your pharmacy* ? ?Testing/Procedures: ?Your physician has requested that you have a cardiac catheterization. Cardiac catheterization is used to diagnose and/or treat various heart conditions. Doctors may recommend this procedure for a number of different reasons. The most common reason is to evaluate chest pain. Chest pain can be a symptom of coronary artery disease (CAD), and cardiac catheterization can show whether plaque is narrowing or blocking your heart?s arteries. This procedure is also used to evaluate the valves, as well as measure the blood flow and oxygen levels in different parts of your heart. For further information please visit [email or web address]. Please follow instruction sheet, as given. ? ? ?Follow-Up: ?At [facility], you and your health needs are our priority.  As part of our continuing mission to provide you with exceptional heart care, we have created designated Provider Care Teams.  These Care Teams include your primary Cardiologist (physician) and Advanced Practice Providers (APPs -  Physician Assistants and Nurse Practitioners) who all work together to provide you with the care you need, when you need it. ? ?We recommend signing up for the patient portal called "MyChart".  Sign up information is provided on this After Visit Summary.  MyChart is used to connect with patients for Virtual Visits (Telemedicine).  Patients are able to view lab/test results, encounter notes, upcoming appointments, etc.  Non-urgent messages can be sent to your provider as well.   ?To learn more about what you can do with MyChart, go to [email or web address].   ? ?Your next appointment:   ?Keep your post procedure follow up with Dr. Sallyanne Kuster on 6/6 at 1:40 pm ? ?If primary card or EP is not listed click here to update    :1}  ? ? ?Other Instructions ? ? ?[place] ?[facility] ?[address], [address] ?[place] [address] ?Dept: [phone] ?Loc: [phone] ? ? ?You are scheduled for a Cardiac Catheterization. We will call you with the date and time. ? ?1. Arrive at the [place] entrance at TBD, one hour prior to your procedure. Free valet service is available.  After entering the [place] please check-in at the registration desk (1st desk on your right) to receive your armband. After receiving your armband someone will escort you to the cardiac cath/special procedures waiting area. ? ?If you have any questions, please call our office at [phone], or you may call the cardiac cath lab at [facility] directly at [phone] ?  ?Special note: Every effort is made to have your procedure done on time. Please understand that emergencies sometimes delay scheduled procedures. ?  ?2. Diet: Do not eat solid foods after midnight.  The patient may have clear liquids until 5am upon the day of the procedure. ? ?3. Labs: You will need to have blood drawn on 05/30/21. ? ?4. Medication instructions in preparation for your procedure: ?Hold the Spironolactone the morning of the procedure ?Hold the Furosemide the morning of the procedure ? ?On the morning of your procedure, take your Aspirin and Plavix/Clopidogrel and any morning medicines NOT listed above.  You may use sips of water. ? ?5. Plan for one night stay--bring personal belongings. ?6. Bring a current list of your medications and current insurance cards. ?7. You MUST have a responsible person to drive you home. ?8. Someone MUST be with you the first 24 hours after you  arrive home or your discharge will be delayed. ?9. Please wear clothes that are easy to get on and off and wear slip-on shoes. ? ?Thank you for allowing [place] to care for you! ?  -- [facility] [place] [facility] ? ?

## 2021-05-31 LAB — BASIC METABOLIC PANEL
BUN/Creatinine Ratio: 20 (ref 10–24)
BUN: 21 mg/dL (ref 8–27)
CO2: 26 mmol/L (ref 20–29)
Calcium: 10.1 mg/dL (ref 8.6–10.2)
Chloride: 98 mmol/L (ref 96–106)
Creatinine, Ser: 1.04 mg/dL (ref 0.76–1.27)
Glucose: 116 mg/dL — ABNORMAL HIGH (ref 70–99)
Potassium: 4.9 mmol/L (ref 3.5–5.2)
Sodium: 140 mmol/L (ref 134–144)
eGFR: 77 mL/min/{1.73_m2} (ref >59)

## 2021-05-31 LAB — CBC
Hematocrit: 43.6 % (ref 37.5–51.0)
Hemoglobin: 14.7 g/dL (ref 13.0–17.7)
MCH: 30.2 pg (ref 26.6–33.0)
MCHC: 33.7 g/dL (ref 31.5–35.7)
MCV: 90 fL (ref 79–97)
Platelets: 280 10*3/uL (ref 150–450)
RBC: 4.86 x10E6/uL (ref 4.14–5.80)
RDW: 13.8 % (ref 11.6–15.4)
WBC: 6.9 10*3/uL (ref 3.4–10.8)

## 2021-06-07 ENCOUNTER — Other Ambulatory Visit: Payer: Self-pay

## 2021-06-07 ENCOUNTER — Ambulatory Visit
Admission: RE | Admit: 2021-06-07 | Discharge: 2021-06-07 | Disposition: A | Discharge status: Home / Self Care | Payer: Medicare HMO | Attending: Cardiovascular Disease | Admitting: Cardiovascular Disease

## 2021-06-07 ENCOUNTER — Encounter
Admission: RE | Disposition: A | Discharge status: Home / Self Care | Payer: Self-pay | Source: Home / Self Care | Attending: Cardiovascular Disease

## 2021-06-07 ENCOUNTER — Encounter: Payer: Self-pay | Admitting: Cardiovascular Disease

## 2021-06-07 ENCOUNTER — Other Ambulatory Visit: Payer: Self-pay | Admitting: *Deleted

## 2021-06-07 DIAGNOSIS — I110 Hypertensive heart disease with heart failure: Secondary | ICD-10-CM | POA: Diagnosis not present

## 2021-06-07 DIAGNOSIS — F32A Depression, unspecified: Secondary | ICD-10-CM | POA: Diagnosis not present

## 2021-06-07 DIAGNOSIS — Z955 Presence of coronary angioplasty implant and graft: Secondary | ICD-10-CM | POA: Insufficient documentation

## 2021-06-07 DIAGNOSIS — I272 Pulmonary hypertension, unspecified: Secondary | ICD-10-CM | POA: Diagnosis not present

## 2021-06-07 DIAGNOSIS — I447 Left bundle-branch block, unspecified: Secondary | ICD-10-CM

## 2021-06-07 DIAGNOSIS — I255 Ischemic cardiomyopathy: Secondary | ICD-10-CM | POA: Insufficient documentation

## 2021-06-07 DIAGNOSIS — I509 Heart failure, unspecified: Secondary | ICD-10-CM

## 2021-06-07 DIAGNOSIS — I252 Old myocardial infarction: Secondary | ICD-10-CM | POA: Insufficient documentation

## 2021-06-07 DIAGNOSIS — Z7984 Long term (current) use of oral hypoglycemic drugs: Secondary | ICD-10-CM | POA: Insufficient documentation

## 2021-06-07 DIAGNOSIS — I5042 Chronic combined systolic (congestive) and diastolic (congestive) heart failure: Secondary | ICD-10-CM | POA: Diagnosis not present

## 2021-06-07 DIAGNOSIS — Z7902 Long term (current) use of antithrombotics/antiplatelets: Secondary | ICD-10-CM | POA: Insufficient documentation

## 2021-06-07 DIAGNOSIS — E785 Hyperlipidemia, unspecified: Secondary | ICD-10-CM | POA: Diagnosis not present

## 2021-06-07 DIAGNOSIS — J449 Chronic obstructive pulmonary disease, unspecified: Secondary | ICD-10-CM | POA: Insufficient documentation

## 2021-06-07 DIAGNOSIS — R7303 Prediabetes: Secondary | ICD-10-CM | POA: Diagnosis not present

## 2021-06-07 DIAGNOSIS — Z87891 Personal history of nicotine dependence: Secondary | ICD-10-CM | POA: Diagnosis not present

## 2021-06-07 DIAGNOSIS — I25118 Atherosclerotic heart disease of native coronary artery with other forms of angina pectoris: Secondary | ICD-10-CM | POA: Insufficient documentation

## 2021-06-07 DIAGNOSIS — Z8673 Personal history of transient ischemic attack (TIA), and cerebral infarction without residual deficits: Secondary | ICD-10-CM | POA: Insufficient documentation

## 2021-06-07 DIAGNOSIS — I5022 Chronic systolic (congestive) heart failure: Secondary | ICD-10-CM | POA: Diagnosis not present

## 2021-06-07 DIAGNOSIS — Z79899 Other long term (current) drug therapy: Secondary | ICD-10-CM | POA: Diagnosis not present

## 2021-06-07 DIAGNOSIS — I2510 Atherosclerotic heart disease of native coronary artery without angina pectoris: Secondary | ICD-10-CM | POA: Diagnosis not present

## 2021-06-07 HISTORY — PX: RIGHT/LEFT HEART CATH AND CORONARY ANGIOGRAPHY: CATH118266

## 2021-06-07 SURGERY — RIGHT/LEFT HEART CATH AND CORONARY ANGIOGRAPHY
Anesthesia: Moderate Sedation | Laterality: Bilateral

## 2021-06-07 MED ORDER — LIDOCAINE HCL 1 % IJ SOLN
INTRAMUSCULAR | Status: AC
  Filled 2021-06-07: qty 20

## 2021-06-07 MED ORDER — SODIUM CHLORIDE 0.9% FLUSH: 3.0000 mL | INTRAVENOUS | Status: DC | PRN

## 2021-06-07 MED ORDER — MIDAZOLAM HCL 2 MG/2ML IJ SOLN
INTRAMUSCULAR | Status: DC | PRN
  Administered 2021-06-07: 1 mg via INTRAVENOUS

## 2021-06-07 MED ORDER — LIDOCAINE HCL (PF) 1 % IJ SOLN
INTRAMUSCULAR | Status: DC | PRN
  Administered 2021-06-07 (×2): 2 mL

## 2021-06-07 MED ORDER — IOHEXOL 300 MG/ML  SOLN
INTRAMUSCULAR | Status: DC | PRN
  Administered 2021-06-07: 50 mL

## 2021-06-07 MED ORDER — ASPIRIN 81 MG PO CHEW: 81.0000 mg | CHEWABLE_TABLET | ORAL | Status: DC

## 2021-06-07 MED ORDER — HEPARIN SODIUM (PORCINE) 1000 UNIT/ML IJ SOLN
INTRAMUSCULAR | Status: AC
Start: 2021-06-07 — End: ?
  Filled 2021-06-07: qty 10

## 2021-06-07 MED ORDER — SODIUM CHLORIDE 0.9 % WEIGHT BASED INFUSION: 3.0000 mL/kg/h | INTRAVENOUS | Status: DC

## 2021-06-07 MED ORDER — FENTANYL CITRATE (PF) 100 MCG/2ML IJ SOLN
INTRAMUSCULAR | Status: AC
  Filled 2021-06-07: qty 2

## 2021-06-07 MED ORDER — SODIUM CHLORIDE 0.9 % IV SOLN: INTRAVENOUS | Status: DC

## 2021-06-07 MED ORDER — VERAPAMIL HCL 2.5 MG/ML IV SOLN
INTRAVENOUS | Status: AC
  Filled 2021-06-07: qty 2

## 2021-06-07 MED ORDER — HEPARIN SODIUM (PORCINE) 1000 UNIT/ML IJ SOLN
INTRAMUSCULAR | Status: DC | PRN
  Administered 2021-06-07: 4000 [IU] via INTRAVENOUS

## 2021-06-07 MED ORDER — SODIUM CHLORIDE 0.9% FLUSH: 3.0000 mL | Freq: Two times a day (BID) | INTRAVENOUS | Status: DC

## 2021-06-07 MED ORDER — MIDAZOLAM HCL 2 MG/2ML IJ SOLN
INTRAMUSCULAR | Status: AC
  Filled 2021-06-07: qty 2

## 2021-06-07 MED ORDER — VERAPAMIL HCL 2.5 MG/ML IV SOLN
INTRAVENOUS | Status: DC | PRN
  Administered 2021-06-07: 2.5 mg via INTRA_ARTERIAL

## 2021-06-07 MED ORDER — FENTANYL CITRATE (PF) 100 MCG/2ML IJ SOLN
INTRAMUSCULAR | Status: DC | PRN
  Administered 2021-06-07: 25 ug via INTRAVENOUS

## 2021-06-07 MED ORDER — ACETAMINOPHEN 325 MG PO TABS: 650.0000 mg | ORAL_TABLET | ORAL | Status: DC | PRN

## 2021-06-07 MED ORDER — ONDANSETRON HCL 4 MG/2ML IJ SOLN: 4.0000 mg | Freq: Four times a day (QID) | INTRAMUSCULAR | Status: DC | PRN

## 2021-06-07 MED ORDER — HEPARIN (PORCINE) IN NACL 1000-0.9 UT/500ML-% IV SOLN
INTRAVENOUS | Status: AC
  Filled 2021-06-07: qty 1000

## 2021-06-07 MED ORDER — SODIUM CHLORIDE 0.9 % IV SOLN: 250.0000 mL | INTRAVENOUS | Status: DC | PRN

## 2021-06-07 MED ORDER — HEPARIN (PORCINE) IN NACL 1000-0.9 UT/500ML-% IV SOLN
INTRAVENOUS | Status: DC | PRN
  Administered 2021-06-07 (×2): 500 mL

## 2021-06-07 MED ORDER — SODIUM CHLORIDE 0.9 % WEIGHT BASED INFUSION: 1.0000 mL/kg/h | INTRAVENOUS | Status: DC

## 2021-06-07 SURGICAL SUPPLY — 12 items
CATH BALLN WEDGE 5F 110CM (CATHETERS) ×1 IMPLANT
CATH INFINITI 5FR JK (CATHETERS) ×1 IMPLANT
DEVICE RAD TR BAND REGULAR (VASCULAR PRODUCTS) ×1 IMPLANT
DRAPE BRACHIAL (DRAPES) ×2 IMPLANT
GLIDESHEATH SLEND SS 6F .021 (SHEATH) ×1 IMPLANT
GUIDEWIRE INQWIRE 1.5J.035X260 (WIRE) IMPLANT
INQWIRE 1.5J .035X260CM (WIRE) ×2
PACK CARDIAC CATH (CUSTOM PROCEDURE TRAY) ×2 IMPLANT
PROTECTION STATION PRESSURIZED (MISCELLANEOUS) ×2
SET ATX SIMPLICITY (MISCELLANEOUS) ×1 IMPLANT
SHEATH GLIDE SLENDER 4/5FR (SHEATH) ×1 IMPLANT
STATION PROTECTION PRESSURIZED (MISCELLANEOUS) IMPLANT

## 2021-06-07 NOTE — Interval H&P Note (Signed)
Cath Lab Visit (complete for each Cath Lab visit)  Clinical Evaluation Leading to the Procedure:   ACS: No.  Non-ACS:    Anginal Classification: CCS II  Anti-ischemic medical therapy: Maximal Therapy (2 or more classes of medications)  Non-Invasive Test Results: No non-invasive testing performed  Prior CABG: No previous CABG      History and Physical Interval Note:  06/07/2021 10:39 AM  Louis Gutierrez  has presented today for surgery, with the diagnosis of RT and LT Cath    Heart failure.  The various methods of treatment have been discussed with the patient and family. After consideration of risks, benefits and other options for treatment, the patient has consented to  Procedure(s): RIGHT/LEFT HEART CATH AND CORONARY ANGIOGRAPHY (Bilateral) as a surgical intervention.  The patient's history has been reviewed, patient examined, no change in status, stable for surgery.  I have reviewed the patient's chart and labs.  Questions were answered to the patient's satisfaction.     Louis Gutierrez

## 2021-06-10 ENCOUNTER — Encounter: Payer: Self-pay | Admitting: Cardiovascular Disease

## 2021-06-18 ENCOUNTER — Telehealth: Payer: Self-pay | Admitting: Cardiovascular Disease

## 2021-06-18 NOTE — Telephone Encounter (Signed)
Patient wanted to know what the next steps were regarding his CPAP

## 2021-06-21 ENCOUNTER — Other Ambulatory Visit: Payer: Self-pay | Admitting: Internal Medicine

## 2021-06-25 ENCOUNTER — Encounter: Payer: Self-pay | Admitting: Cardiovascular Disease

## 2021-06-25 ENCOUNTER — Ambulatory Visit (INDEPENDENT_AMBULATORY_CARE_PROVIDER_SITE_OTHER): Payer: Medicare HMO | Admitting: Cardiovascular Disease

## 2021-06-25 VITALS — BP 94/68 | HR 69 | Ht 71.0 in | Wt 187.8 lb

## 2021-06-25 DIAGNOSIS — E785 Hyperlipidemia, unspecified: Secondary | ICD-10-CM | POA: Diagnosis not present

## 2021-06-25 DIAGNOSIS — R7303 Prediabetes: Secondary | ICD-10-CM

## 2021-06-25 DIAGNOSIS — I2510 Atherosclerotic heart disease of native coronary artery without angina pectoris: Secondary | ICD-10-CM | POA: Diagnosis not present

## 2021-06-25 DIAGNOSIS — Z8673 Personal history of transient ischemic attack (TIA), and cerebral infarction without residual deficits: Secondary | ICD-10-CM | POA: Diagnosis not present

## 2021-06-25 DIAGNOSIS — I5042 Chronic combined systolic (congestive) and diastolic (congestive) heart failure: Secondary | ICD-10-CM

## 2021-06-25 DIAGNOSIS — F325 Major depressive disorder, single episode, in full remission: Secondary | ICD-10-CM

## 2021-06-25 DIAGNOSIS — J449 Chronic obstructive pulmonary disease, unspecified: Secondary | ICD-10-CM

## 2021-06-25 DIAGNOSIS — I10 Essential (primary) hypertension: Secondary | ICD-10-CM | POA: Diagnosis not present

## 2021-06-25 DIAGNOSIS — I447 Left bundle-branch block, unspecified: Secondary | ICD-10-CM | POA: Diagnosis not present

## 2021-06-25 NOTE — Patient Instructions (Signed)
Medication Instructions:  No changes *If you need a refill on your cardiac medications before your next appointment, please call your pharmacy*   Lab Work: None ordered If you have labs (blood work) drawn today and your tests are completely normal, you will receive your results only by: MyChart Message (if you have MyChart) OR A paper copy in the mail If you have any lab test that is abnormal or we need to change your treatment, we will call you to review the results.   Testing/Procedures: None ordered   Follow-Up: At [facility], you and your health needs are our priority.  As part of our continuing mission to provide you with exceptional heart care, we have created designated Provider Care Teams.  These Care Teams include your primary Cardiologist (physician) and Advanced Practice Providers (APPs -  Physician Assistants and Nurse Practitioners) who all work together to provide you with the care you need, when you need it.  We recommend signing up for the patient portal called "MyChart".  Sign up information is provided on this After Visit Summary.  MyChart is used to connect with patients for Virtual Visits (Telemedicine).  Patients are able to view lab/test results, encounter notes, upcoming appointments, etc.  Non-urgent messages can be sent to your provider as well.   To learn more about what you can do with MyChart, go to [email or web address].    Your next appointment:   3 month(s)  The format for your next appointment:   In Person  Provider:   Mihai Croitoru, MD {    Important Information About Sugar       

## 2021-06-25 NOTE — Progress Notes (Signed)
Cardiology Office Note    Date:  06/27/2021   ID:  Louis Gutierrez, DOB 1950/10/17, MRN [id number]  PCP:  Kirk Ruths, MD  Cardiologist:   Sanda Klein, MD   Chief Complaint  Patient presents with   Congestive Heart Failure    History of Present Illness:  Louis Gutierrez is a 71 y.o. male with coronary artery disease, congestive heart failure, COPD and a history of cryptogenic stroke.    Patient presented with inferior wall STEMI in 2012 when he underwent placement of a drug-eluting stent to the RCA for total occlusion (2.5 x 24 mm Promus), and was also found to have moderate stenosis in the LAD, first diagonal artery and subclavian artery as well as chronic total occlusion of the small nondominant left circumflex coronary artery.  His EF has been mildly decreased at about 50% ever since.  He had an unexplained stroke with right hemiparesis and expressive aphasia in 2015.  Loop recorder did not show evidence of atrial fibrillation throughout its entire 3-year operation.  He was admitted to [facility] in early March with severe shortness of breath and was treated for both heart failure and COPD exacerbation (steroids, bronchodilators, furosemide).  Echocardiogram performed that time showed LVEF 35-40% with grade 1 diastolic dysfunction.  BNP 197  February 28, decreased to 60 on March 31.  He returns in follow-up after undergoing cardiac catheterization in 2023.  He had normal right and left heart filling pressures.  LVEF was estimated to be severely reduced with an ejection fraction of 30-35%.  The previously placed stent in the right coronary artery was widely patent and he did not have any other meaningful stenoses.  Most severe lesion was a 40% proximal-mid left circumflex stenosis.  His ECG shows a typical left bundle branch block with a very broad QRS of 180 ms.  Continues to have NYHA functional class II-occasionally III dyspnea on exertion.  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  Denies orthopnea, PND, lower extremity edema, dizziness, palpitations, syncope or claudication.  His blood pressure is a little low today at 94/68 but he is completely asymptomatic from this point of view.  He is on maximum dose Entresto, highest tolerated dose of carvedilol, spironolactone and Farxiga.  He continues to take dual antiplatelet therapy without bleeding problems.  He has good glycemic control with a recent hemoglobin A1c of 6.2%.  Most recent direct LDL cholesterol on 04/19/2021 was 67 on maximum dose pravastatin.  Most recent creatinine was normal at 1.04  He feels more optimistic now and is no longer overtly depressed as he was at his last appointment.  He had a screening CT lung cancer which showed coronary artery calcifications (not surprising) and gallstones (asymptomatic).  Since this cryptogenic stroke, he has an implantable loop recorder that has reached end of service and is left in place.  During 3 years of monitoring that did not show atrial fibrillation.  He has not had any new neurological events.  His loop recorder has reached end of service.  He presented with ST segment elevation myocardial infarction in May of 2012. At that time had an inferior wall infarction, manifesting as unstable angina pectoris and eventually a syncopal episode. The right coronary artery was totally occluded and revascularization was difficult but eventually he received a 3.5 x 24 mm Promus drug-eluting stent to the mid RCA. Also noted was 50% ostial stenosis of  the first diagonal artery and 40% ostial stenosis of the second diagonal artery branches of the LAD, as well as total occlusion of a small left circumflex system. There was evidence of inferolateral hypokinesis and ejection fraction of 45-50% at that time. He presented with chest pain to [facility] in 2015 when he had a repeat coronary angiogram that was  reportedly normal. I don't have that report. In May 2015 he presented with right hemiparesis and expressive aphasia with an infarction in the territory of the left middle cerebral artery. An etiology was not identified. He underwent loop recorder implantation in August 2015 and in 3 years the device has not recorded any arrhythmia. His most recent echo performed in March 2015 showed left ventricular ejection fraction of 50-55 percent with abnormal relaxation and no regional wall motion abnormalities. Both atria were described as normal in size and there were no serious valvular abnormalities.  Identical findings recorded in September 2018 echo, again EF 50-55% and the left atrium was described as normal in size.  During the hospitalization for acute shortness of breath at [facility] in March 2023 LVEF was estimated at 35-40%.  [name] and [place] were initiated.    Past Medical History:  Diagnosis Date   Adrenal adenoma, left    Anxiety    Aortic atherosclerosis (HCC)    Asthma without status asthmaticus    Atherosclerotic cerebrovascular disease    Overview:  Small and facial weakness with minimal weakness    CAD (coronary artery disease)    a. 05/2010 Inf STEMI/PCI: RCA 156m(3.5x24 Promus DES), LCX small, 100, D1 50ost, D2 40ost, EF 45-50%; b. 03/2013 Cath ([facility]): 40% mLAD, 20% stenosis mRCA (site of the previous stent); LVEF 55%.   Cervical disc disease    a. s/p C3-4 fusion 2003.   Cervical myelopathy ([place]    a. 09/2016 Admit w/ left sided wkns->found to have progressive cervical disc dzs above and below prior fusioin site; b. 10/2016 s/p ant cervical diskectomy.   COPD (chronic obstructive pulmonary disease) ([place]    [place] 05[place]    s/p acute L MCA territory infarct; b. 07/2013 s/p MDT Linq ILR-->No AFib to documented to date (09/2016).   Current use of long term anticoagulation    DAPT therapy (ASA + clopidogrel)   Depression    Diabetes mellitus without complication  (HCC)    Essential (primary) hypertension    Gastritis    GERD (gastroesophageal reflux disease)    H/O insomnia    History of cerebrovascular accident (CVA) with residual deficit 2015   facial drooping on one side of face is only residual since stroke   IDA (iron deficiency anemia)    Inguinal hernia    Insomnia disorder    Ischemic cardiomyopathy    a. 05/2010 LV gram: EF 45-50% @ time of inf MI; b. 09/2016 Echo: EF 50-55%, inf HK, Gr1 DD, mild MR.   Mixed hyperlipidemia    Nephrolithiasis    Obesity    OSA (obstructive sleep apnea)    no cpap   Osteoarthritis    STEMI (ST elevation myocardial infarction) [place] 06[place]07[place]2010   1 stent   Tobacco abuse    Urinary incontinence    Varicose veins    Vertigo     Past Surgical History:  Procedure Laterality Date   ANTERIOR CERVICAL DECOMP/DISCECTOMY FUSION N/A 10/22/2016   Procedure: ANTERIOR CERVICAL DECOMPRESSION/DISCECTOMY FUSION 2 LEVELS C3-4, C6-7;  Surgeon: YMeade Maw MD;  Location: [facility] ORS;  Service:  Neurosurgery;  Laterality: N/A;   CARDIAC CATHETERIZATION  06/07/2010   50 % ostial narrowing in 1st diagonal from LAD, 2nd diagonal had 40% ostial narrowing; AV groove circumflex was diminutive and occluded proximally; RCA totally occluded in mid segment - stented with 3.5 x 15m Promus Element DES  - See Media tab   CARDIAC CATHETERIZATION  04/04/2013   40% mLAD, 20% mRCA at the site of the previous stent.  LVEF was 55%.   COLONOSCOPY WITH PROPOFOL N/A 02/26/2015   Procedure: COLONOSCOPY WITH PROPOFOL;  Surgeon: MJosefine Class MD;  [facility];  Service: Endoscopy;  Laterality: N/A;   COLONOSCOPY WITH PROPOFOL N/A 03/16/2020   Procedure: COLONOSCOPY WITH PROPOFOL;  Surgeon: LLesly Rubenstein MD;  Location: [facility] ENDOSCOPY;  Service: Endoscopy;  Laterality: N/A;   ESOPHAGOGASTRODUODENOSCOPY  02/26/2015   Procedure: ESOPHAGOGASTRODUODENOSCOPY (EGD);  Surgeon: MJosefine Class MD;  [facility];   Service: Endoscopy;;   ESOPHAGOGASTRODUODENOSCOPY (EGD) WITH PROPOFOL N/A 03/16/2020   Procedure: ESOPHAGOGASTRODUODENOSCOPY (EGD) WITH PROPOFOL;  Surgeon: LLesly Rubenstein MD;  Location: [facility] END[facility]COPY;  Service: Endoscopy;  Laterality: N/A;   EYE SURGERY     cataract replaced   LOOP RECORDER IMPLANT  08/16/13   Dr. CSallyanne Kuster  RIGHT/LEFT HEART CATH AND CORONARY ANGIOGRAPHY Bilateral 06/07/2021   Procedure: RIGHT/LEFT HEART CATH AND CORONARY ANGIOGRAPHY;  Surgeon: AWellington Hampshire MD;  [facility];  Service: Cardiovascular;  Laterality: Bilateral;   XI ROBOTIC ASSISTED INGUINAL HERNIA REPAIR WITH MESH Left 05/23/2020   Procedure: XI ROBOTIC ASSISTED INGUINAL HERNIA REPAIR WITH MESH;  Surgeon: CHerbert Pun MD;  Location: [facility] ORS;  Service: General;  Laterality: Left;    Current Medications: Outpatient Medications Prior to Visit  Medication Sig Dispense Refill   ACCU-CHEK GUIDE test strip USE 1 STRIP TO TEST ONCE DAILY     aspirin 81 MG chewable tablet Chew 81 mg by mouth daily.     carvedilol (COREG) 6.25 MG tablet Take 1 tablet (6.25 mg total) by mouth 2 (two) times daily. 60 tablet 3   clopidogrel (PLAVIX) 75 MG tablet TAKE 1 TABLET(75 MG) BY MOUTH DAILY 90 tablet 3   COMBIVENT RESPIMAT 20-100 MCG/ACT AERS respimat Inhale 1 puff into the lungs every 6 (six) hours as needed for wheezing.     dapagliflozin propanediol (FARXIGA) 10 MG TABS tablet Take 1 tablet (10 mg total) by mouth daily before breakfast. 30 tablet 0   ferrous sulfate 325 (65 FE) MG EC tablet Take 1 tablet (325 mg total) by mouth in the morning and at bedtime. 60 tablet 11   furosemide (LASIX) 20 MG tablet Take 1 tablet (20 mg total) by mouth daily. 90 tablet 3   NICOTROL NS 10 MG/ML SOLN Place into both nostrils as needed.     pantoprazole (PROTONIX) 40 MG tablet Take 40 mg by mouth daily.     PARoxetine (PAXIL) 40 MG tablet Take 40 mg by mouth daily.      pravastatin (PRAVACHOL) 80 MG tablet  Take 1 tablet (80 mg total) by mouth every evening. 90 tablet 3   sacubitril-valsartan (ENTRESTO) 97-103 MG Take 1 tablet by mouth 2 (two) times daily. 60 tablet 5   spironolactone (ALDACTONE) 25 MG tablet Take 0.5 tablets (12.5 mg total) by mouth daily. 45 tablet 3   traZODone (DESYREL) 100 MG tablet Take 50 mg by mouth at bedtime as needed for sleep.      No facility-administered medications prior to visit.  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  Tobacco Use   Smoking status: Every Day    Packs/day: 1.00    Years: 53.00    Total pack years: 53.00    Types: Cigarettes    Last attempt to quit: 11/14/2019    Years since quitting: 1.6   Smokeless tobacco: Never   Tobacco comments:    Down to 0.25 packs per day  Vaping Use   Vaping Use: Some days   Start date: 11/14/2019   Substances: Nicotine  Substance and Sexual Activity   Alcohol use: No   Drug use: No   Sexual activity: Not on file  Other Topics Concern   Not on file  Social History Narrative   Lives in [place] with wife and two daughters. Feels safe in his home.   Walks dog 30 mins 2x/day w/o difficulties.      Continues to work part time.   Social Determinants of Health   Financial Resource Strain: Not on file  Food Insecurity: No Food Insecurity (08/23/2020)   Hunger Vital Sign    Worried About Running Out of Food in the Last Year: Never true    Ran Out of Food in the Last Year: Never true  Transportation Needs: No Transportation Needs (08/23/2020)   PRAPARE - Hydrologist (Medical): No    Lack of Transportation (Non-Medical): No  Physical Activity: Not on file  Stress:  Not on file  Social Connections: Not on file     Family History:  The patient's family history includes Cancer in his mother; Coronary artery disease in his brother; Diabetes in his brother; Stroke in his maternal grandmother.   ROS:   Please see the history of present illness.    ROS All other systems are reviewed and are negative.   PHYSICAL EXAM:   VS:  BP 94/68 (BP Location: Left Arm, Patient Position: Sitting, Cuff Size: Normal)   Pulse 69   Ht '5\' 11"'$  (1.803 m)   Wt 187 lb 12.8 oz (85.2 kg)   SpO2 92%   BMI 26.19 kg/m      General: Alert, oriented x3, no distress Head: no evidence of trauma, PERRL, EOMI, no exophtalmos or lid lag, no myxedema, no xanthelasma; normal ears, nose and oropharynx Neck: normal jugular venous pulsations and no hepatojugular reflux; brisk carotid pulses without delay and no carotid bruits Chest: clear to auscultation, no signs of consolidation by percussion or palpation, normal fremitus, symmetrical and full respiratory excursions Cardiovascular: normal position and quality of the apical impulse, regular rhythm, normal first and paradoxically split second heart sounds, no murmurs, rubs or gallops Abdomen: no tenderness or distention, no masses by palpation, no abnormal pulsatility or arterial bruits, normal bowel sounds, no hepatosplenomegaly Extremities: no clubbing, cyanosis or edema; 2+ radial, ulnar and brachial pulses bilaterally; 2+ right femoral, posterior tibial and dorsalis pedis pulses; 2+ left femoral, posterior tibial and dorsalis pedis pulses; no subclavian or femoral bruits Neurological: grossly nonfocal Psych: Normal mood and affect   Wt Readings from Last 3 Encounters:  06/25/21 187 lb 12.8 oz (85.2 kg)  06/07/21 180 lb (81.6 kg)  05/30/21 189 lb 6.4 oz (85.9 kg)      Studies/Labs Reviewed:   06/07/2021 RIGHT/LEFT HEART CATH AND CORONARY ANGIOGRAPHY    Prox Cx to Mid Cx lesion is 40% stenosed.   Prox RCA to Mid RCA lesion is  10% stenosed.   There is moderate left ventricular systolic dysfunction.   LV end diastolic pressure is normal.   1.  Widely patent RCA stent with minimal restenosis.  No evidence of obstructive coronary artery disease. 2.  Moderately to severely reduced LV systolic function with an EF of 30 to 35%. 3.  Right heart catheterization showed normal right and left-sided filling pressures, minimal pulmonary hypertension and normal cardiac output.   Recommendations: The patient's cardiomyopathy seems to be out of proportion to his coronary artery disease and his previous inferior myocardial infarction.  There is likely a nonischemic component.  His hemodynamics look excellent.  Continue same medications. Consider CRT given underlying left bundle branch block and worsening ejection fraction.   Fick Cardiac Output 4.62 L/min  Fick Cardiac Output Index 2.29 (L/min)/BSA  RA A Wave 9 mmHg  RA V Wave 8 mmHg  RA Mean 6 mmHg  RV Systolic Pressure 34 mmHg  RV Diastolic Pressure 3 mmHg  RV EDP 10 mmHg  PA Systolic Pressure 32 mmHg  PA Diastolic Pressure 16 mmHg  PA Mean 22 mmHg  PW A Wave 11 mmHg  PW V Wave 12 mmHg  PW Mean 10 mmHg  LV Systolic Pressure 124 mmHg  LV Diastolic Pressure -1 mmHg  LV EDP 7 mmHg  AOp Systolic Pressure 93 mmHg  AOp Diastolic Pressure 55 mmHg  AOp Mean Pressure 70 mmHg  LVp Systolic Pressure 94 mmHg  LVp Diastolic Pressure 0 mmHg  LVp EDP Pressure 9 mmHg  QP/QS 1  TPVR Index 9.59 HRUI    Echocardiogram 03/20/2021:    1. Left ventricular ejection fraction, by estimation, is 35 to 40%. The  left ventricle has moderately decreased function. The left ventricle  demonstrates regional wall motion abnormalities (see scoring  diagram/findings for description). Left ventricular   diastolic parameters are consistent with Grade I diastolic dysfunction  (impaired relaxation). There is LV global hypokinesis with inferior and  inferolateral wall akinesis.   2. Right  ventricular systolic function is normal. The right ventricular  size is normal.   3. The mitral valve is normal in structure. Mild mitral valve  regurgitation.   4. The aortic valve is tricuspid. Aortic valve regurgitation is not  visualized. Aortic valve sclerosis is present, with no evidence of aortic  valve stenosis.   5. Aortic dilatation noted. There is mild dilatation of the aortic root,  measuring 40 mm.   6. The inferior vena cava is normal in size with <50% respiratory  variability, suggesting right atrial pressure of 8 mmHg.   EKG:  EKG is ordered today and shows normal sinus rhythm, left atrial abnormality, left bundle branch block with left axis deviation, QRS duration 180 ms, QTc 488 ms  Recent Labs:     Latest Ref Rng & Units 05/30/2021   12:06 PM 05/03/2021    1:09 PM 04/19/2021   11:26 AM  BMP  Glucose 70 - 99 mg/dL 116  126  125   BUN 8 - 27 mg/dL '21  15  16   '$ Creatinine 0.76 - 1.27 mg/dL 1.04  0.99  1.05   BUN/Creat Ratio 10 - '24 20  15  15   '$ Sodium  134 - 144 mmol/L 140  144  141   Potassium 3.5 - 5.2 mmol/L 4.9  4.9  4.2   Chloride 96 - 106 mmol/L 98  102  99   CO2 20 - 29 mmol/L '26  26  27   '$ Calcium 8.6 - 10.2 mg/dL 10.1  9.8  9.6       Lipid Panel    Component Value Date/Time   CHOL 115 12/22/2019 0908   CHOL 110 05/22/2013 0526   TRIG 66 12/22/2019 0908   TRIG 162 05/22/2013 0526   HDL 46 12/22/2019 0908   HDL 17 (L) 05/22/2013 0526   CHOLHDL 2.5 12/22/2019 0908   CHOLHDL 3.1 10/08/2016 0507   VLDL 17 10/08/2016 0507   VLDL 32 05/22/2013 0526   LDLCALC 55 12/22/2019 0908   LDLCALC 61 05/22/2013 0526   LDLDIRECT 67 04/19/2021 1126   04/25/2021 Hemoglobin A1c 6.3%  ASSESSMENT:    1. Chronic combined systolic and diastolic congestive heart failure ([place])   2. Coronary artery disease involving native coronary artery of native heart without angina pectoris   3. LBBB (left bundle branch block)   4. History of arterial ischemic stroke   5.  Hyperlipidemia LDL goal <70   6. Prediabetes   7. Essential hypertension   8. Chronic obstructive pulmonary disease, unspecified COPD type ([place])   9. Major depression in remission ([place])      PLAN:  In order of problems listed above:  CHF: Most recent LVEF assessment is 30-35% by LV angiography.  Despite the absence of new coronary stenoses, he has had declining left ventricular systolic function.  In large part this appears to be due to a very broad left bundle branch block with profound dyssynchrony of contraction.  He is on guideline directed medical therapy with Entresto/carvedilol/spironolactone/dapagliflozin.  I think he will benefit greatly from cardiac resynchronization therapy.  He has a typical LBBB and a very broad QRS at 180 ms.  He appears to have a combination of ischemic and nonischemic cardiomyopathy.  We discussed the option of CRT-P versus CRT-D and he seems inclined to pursue the latter.  Has an appointment with EP on July 10.  At the time of heart catheterization he was very well compensated hemodynamically with a mean pulmonary artery wedge pressure of 10 mmHg and mean PA pressure of 16 mmHg.  Current diuretic dose appears to be appropriate.  Clinically he appears euvolemic today as well. CAD: He does not have angina pectoris.  There has been however significant reduction in LV function.  Regional wall motion abnormality suggest that he may have now occluded the right coronary artery.  He has known chronic total occlusion of the circumflex and had a moderate stenosis in the LAD artery.  Recommend repeat coronary angiography.  Continue aspirin, statin, beta-blocker.  He is also on clopidogrel due to his history of stroke.  He should continue this. LBBB: He has an extremely broad QRS complex with typical left bundle morphology and a long first-degree AV block.  He probably has mixed ischemic and nonischemic cardiomyopathy.  Likely to derive symptomatic and survival benefit from  CRT. Hx cryptogenic ischemic stroke: This has not recurred in about 8 years.  He did not have atrial fibrillation during 3 years of implantable loop recorder monitoring. HLP/prediabetes: LDL cholesterol and glycemic control are good on current medications. HTN: With all his heart failure medication his blood pressure is borderline low, but he has no symptoms of hypotension. COPD/history smoking: He has  quit smoking.  He had normal filling pressures at the time of his cardiac catheterization so any residual dyspnea at that time is likely due to COPD. Depression: Seems to have improved.    Medication Adjustments/Labs and Tests Ordered: Current medicines are reviewed at length with the patient today.  Concerns regarding medicines are outlined above.  Medication changes, Labs and Tests ordered today are listed in the Patient Instructions below. Patient Instructions  Medication Instructions:  No changes *If you need a refill on your cardiac medications before your next appointment, please call your pharmacy*   Lab Work: None ordered If you have labs (blood work) drawn today and your tests are completely normal, you will receive your results only by: [place] (if you have MyChart) OR A paper copy in the mail If you have any lab test that is abnormal or we need to change your treatment, we will call you to review the results.   Testing/Procedures: None ordered   Follow-Up: At [facility], you and your health needs are our priority.  As part of our continuing mission to provide you with exceptional heart care, we have created designated Provider Care Teams.  These Care Teams include your primary Cardiologist (physician) and Advanced Practice Providers (APPs -  Physician Assistants and Nurse Practitioners) who all work together to provide you with the care you need, when you need it.  We recommend signing up for the patient portal called "MyChart".  Sign up information is provided on  this After Visit Summary.  MyChart is used to connect with patients for Virtual Visits (Telemedicine).  Patients are able to view lab/test results, encounter notes, upcoming appointments, etc.  Non-urgent messages can be sent to your provider as well.   To learn more about what you can do with MyChart, go to [email or web address].    Your next appointment:   3 month(s)  The format for your next appointment:   In Person  Provider:   Sanda Klein, MD {   Important Information About Sugar         Signed, Sanda Klein, MD  06/27/2021 7:57 AM    [address] [place], [place], [place]  [address] Phone: [phone]; Fax: [phone]   Shared Decision Making/Informed Consent The risks [stroke (1 in 1000), death (1 in 1000), kidney failure [usually temporary] (1 in 500), bleeding (1 in 200), allergic reaction [possibly serious] (1 in 200)], benefits (diagnostic support and management of coronary artery disease) and alternatives of a cardiac catheterization were discussed in detail with Mr. Tursi and he is willing to proceed.

## 2021-06-28 NOTE — Telephone Encounter (Signed)
Patient understands it is recommended he have a cpap titration to alleviate his apneas. Patient states he has an appointment on Monday 6/12 to have his test and he will have the results sent over for Dr Radford Pax to review.

## 2021-07-01 ENCOUNTER — Ambulatory Visit: Payer: Medicare HMO | Attending: Otolaryngology

## 2021-07-01 DIAGNOSIS — I10 Essential (primary) hypertension: Secondary | ICD-10-CM | POA: Insufficient documentation

## 2021-07-01 DIAGNOSIS — G4700 Insomnia, unspecified: Secondary | ICD-10-CM | POA: Diagnosis not present

## 2021-07-01 DIAGNOSIS — G4733 Obstructive sleep apnea (adult) (pediatric): Secondary | ICD-10-CM | POA: Insufficient documentation

## 2021-07-03 ENCOUNTER — Encounter: Payer: Self-pay | Admitting: Gastroenterology

## 2021-07-04 ENCOUNTER — Encounter: Admission: RE | Payer: Self-pay | Source: Home / Self Care

## 2021-07-04 ENCOUNTER — Encounter: Payer: Self-pay | Admitting: Anesthesiology

## 2021-07-04 ENCOUNTER — Ambulatory Visit: Admission: RE | Admit: 2021-07-04 | Payer: Medicare HMO | Source: Home / Self Care | Admitting: Gastroenterology

## 2021-07-04 SURGERY — COLONOSCOPY
Anesthesia: General

## 2021-07-04 NOTE — Anesthesia Preprocedure Evaluation (Deleted)
Anesthesia Evaluation    Airway        Dental   Pulmonary asthma , sleep apnea and Continuous Positive Airway Pressure Ventilation , former smoker (quit 2021),           Cardiovascular hypertension, + CAD (s/p MI and stents) and +CHF (ICM)    ECG 06/25/21: normal sinus rhythm, left atrial abnormality, left bundle branch block with left axis deviation, QRS duration 180 ms, QTc 488 ms  Heart cath 06/07/21:  . Prox Cx to Mid Cx lesion is 40% stenosed. . Prox RCA to Mid RCA lesion is 10% stenosed. . There is moderate left ventricular systolic dysfunction. . LV end diastolic pressure is normal. 1. Widely patent RCA stent with minimal restenosis. No evidence of obstructive coronary artery disease. 2. Moderately to severely reduced LV systolic function with an EF of 30 to 35%. 3. Right heart catheterization showed normal right and left-sided filling pressures, minimal pulmonary hypertension and normal cardiac output.  Echo 03/20/21:  1. Left ventricular ejection fraction, by estimation, is 35 to 40%. The left ventricle has moderately decreased function. The left ventricle demonstrates regional wall motion abnormalities (see scoring diagram/findings for description). Left ventricular diastolic parameters are consistent with Grade I diastolic dysfunction (impaired relaxation). There is LV global hypokinesis with inferior and inferolateral wall akinesis.  2. Right ventricular systolic function is normal. The right ventricular size is normal.  3. The mitral valve is normal in structure. Mild mitral valve  regurgitation.  4. The aortic valve is tricuspid. Aortic valve regurgitation is not visualized. Aortic valve sclerosis is present, with no evidence of aortic valve stenosis.  5. Aortic dilatation noted. There is mild dilatation of the aortic root, measuring 40 mm.  6. The inferior vena cava is normal in size with <50% respiratory variability,  suggesting right atrial pressure of 8 mmHg.    Neuro/Psych PSYCHIATRIC DISORDERS Anxiety Depression S/p C3-4 fusion CVA (2015 on Plavix)    GI/Hepatic GERD  ,  Endo/Other  diabetes, Type 2  Renal/GU Renal disease (nephrolithiasis)     Musculoskeletal  (+) Arthritis ,   Abdominal   Peds  Hematology  (+) Blood dyscrasia, anemia ,   Anesthesia Other Findings Cardiology note 06/25/21:  1. CHF: Most recent LVEF assessment is 30-35% by LV angiography.  Despite the absence of new coronary stenoses, he has had declining left ventricular systolic function.  In large part this appears to be due to a very broad left bundle branch block with profound dyssynchrony of contraction.  He is on guideline directed medical therapy with Entresto/carvedilol/spironolactone/dapagliflozin.  I think he will benefit greatly from cardiac resynchronization therapy.  He has a typical LBBB and a very broad QRS at 180 ms.  He appears to have a combination of ischemic and nonischemic cardiomyopathy.  We discussed the option of CRT-P versus CRT-D and he seems inclined to pursue the latter.  Has an appointment with EP on July 10.  At the time of heart catheterization he was very well compensated hemodynamically with a mean pulmonary artery wedge pressure of 10 mmHg and mean PA pressure of 16 mmHg.  Current diuretic dose appears to be appropriate.  Clinically he appears euvolemic today as well. 2. CAD: He does not have angina pectoris.  There has been however significant reduction in LV function.  Regional wall motion abnormality suggest that he may have now occluded the right coronary artery.  He has known chronic total occlusion of the circumflex and had a moderate stenosis in  the LAD artery.  Recommend repeat coronary angiography.  Continue aspirin, statin, beta-blocker.  He is also on clopidogrel due to his history of stroke.  He should continue this. 3. LBBB: He has an extremely broad QRS complex with typical left bundle  morphology and a long first-degree AV block.  He probably has mixed ischemic and nonischemic cardiomyopathy.  Likely to derive symptomatic and survival benefit from CRT. 4. Hx cryptogenic ischemic stroke: This has not recurred in about 8 years.  He did not have atrial fibrillation during 3 years of implantable loop recorder monitoring. 5. HLP/prediabetes: LDL cholesterol and glycemic control are good on current medications. 6. HTN: With all his heart failure medication his blood pressure is borderline low, but he has no symptoms of hypotension. 7. COPD/history smoking: He has quit smoking.  He had normal filling pressures at the time of his cardiac catheterization so any residual dyspnea at that time is likely due to COPD. 8. Depression: Seems to have improved.  Reproductive/Obstetrics                             Anesthesia Physical Anesthesia Plan  ASA: 3  Anesthesia Plan: General   Post-op Pain Management:    Induction: Intravenous  PONV Risk Score and Plan: 2 and Propofol infusion, TIVA and Treatment may vary due to age or medical condition  Airway Management Planned: Natural Airway  Additional Equipment:   Intra-op Plan:   Post-operative Plan:   Informed Consent: I have reviewed the patients History and Physical, chart, labs and discussed the procedure including the risks, benefits and alternatives for the proposed anesthesia with the patient or authorized representative who has indicated his/her understanding and acceptance.       Plan Discussed with: CRNA  Anesthesia Plan Comments: (LMA/GETA backup discussed.  Patient consented for risks of anesthesia including but not limited to:  - adverse reactions to medications - damage to eyes, teeth, lips or other oral mucosa - nerve damage due to positioning  - sore throat or hoarseness - damage to heart, brain, nerves, lungs, other parts of body or loss of life  Informed patient about role of CRNA in  peri- and intra-operative care.  Patient voiced understanding.)        Anesthesia Quick Evaluation

## 2021-07-07 DIAGNOSIS — G4733 Obstructive sleep apnea (adult) (pediatric): Secondary | ICD-10-CM | POA: Diagnosis not present

## 2021-07-08 ENCOUNTER — Other Ambulatory Visit: Payer: Self-pay

## 2021-07-10 DIAGNOSIS — H524 Presbyopia: Secondary | ICD-10-CM | POA: Diagnosis not present

## 2021-07-10 DIAGNOSIS — Z0100 Encounter for examination of eyes and vision without abnormal findings: Secondary | ICD-10-CM | POA: Diagnosis not present

## 2021-07-15 ENCOUNTER — Other Ambulatory Visit: Payer: Self-pay | Admitting: Cardiovascular Disease

## 2021-07-28 NOTE — Progress Notes (Unsigned)
Electrophysiology Office Note   Date:  07/28/2021   ID:  Louis Gutierrez, DOB September 08, 1950, MRN [id number]  PCP:  Lauro Regulus, MD  Cardiologist:  [facility] Primary Electrophysiologist:  Kimber Fritts Jorja Loa, MD    Chief Complaint: CHF   History of Present Illness: Louis Gutierrez is a 71 y.o. male who is being seen today for the evaluation of CHF at the request of Croitoru, Mihai, MD. Presenting today for electrophysiology evaluation.  He has a history significant for coronary artery disease, chronic systolic heart failure, COPD, cryptogenic stroke.  He presented to the hospital with STEMI in 2012 and underwent DES to the RCA.  He was readmitted to [facility] with severe shortness of breath.  Repeat echo showed an ejection fraction of 35 to 40%.  He has NYHA class II-III dyspnea on exertion.  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  He is currently on optimal medical therapy for his heart failure.  Today, he denies*** symptoms of palpitations, chest pain, shortness of breath, orthopnea, PND, lower extremity edema, claudication, dizziness, presyncope, syncope, bleeding, or neurologic sequela. The patient is tolerating medications without difficulties.    Past Medical History:  Diagnosis Date   Adrenal adenoma, left    Anxiety    Aortic atherosclerosis (HCC)    Asthma without status asthmaticus    Atherosclerotic cerebrovascular disease    Overview:  Small and facial weakness with minimal weakness    CAD (coronary artery disease)    a. 05/2010 Inf STEMI/PCI: RCA 160m (3.5x24 Promus DES), LCX small, 100, D1 50ost, D2 40ost, EF 45-50%; b. 03/2013 Cath ([facility]): 40% mLAD, 20% stenosis mRCA (site of the previous stent); LVEF 55%.   Cervical disc disease    a. s/p C3-4 fusion 2003.   Cervical myelopathy (HCC)    a. 09/2016 Admit w/ left sided wkns->found to have progressive cervical disc dzs above and below prior fusioin site; b. 10/2016 s/p  ant cervical diskectomy.   COPD (chronic obstructive pulmonary disease) (HCC)    Cryptogenic stroke (HCC) 05/2013    s/p acute L MCA territory infarct; b. 07/2013 s/p MDT Linq ILR-->No AFib to documented to date (09/2016).   Current use of long term anticoagulation    DAPT therapy (ASA + clopidogrel)   Depression    Diabetes mellitus without complication (HCC)    Essential (primary) hypertension    Gastritis    GERD (gastroesophageal reflux disease)    H/O insomnia    History of cerebrovascular accident (CVA) with residual deficit 2015   facial drooping on one side of face is only residual since stroke   IDA (iron deficiency anemia)    Inguinal hernia    Insomnia disorder    Ischemic cardiomyopathy    a. 05/2010 LV gram: EF 45-50% @ time of inf MI; b. 09/2016 Echo: EF 50-55%, inf HK, Gr1 DD, mild MR.   Mixed hyperlipidemia    Nephrolithiasis    Obesity    OSA (obstructive sleep apnea)    no cpap   Osteoarthritis    STEMI (ST elevation myocardial infarction) (HCC) 06/07/2010   1 stent   Tobacco abuse    Urinary incontinence    Varicose veins    Vertigo    Past Surgical History:  Procedure Laterality Date   ANTERIOR CERVICAL DECOMP/DISCECTOMY FUSION N/A 10/22/2016   Procedure: ANTERIOR CERVICAL DECOMPRESSION/DISCECTOMY FUSION 2 LEVELS C3-4, C6-7;  Surgeon: Venetia Night, MD;  Location: [facility] ORS;  Service: Neurosurgery;  Laterality: N/A;   BACK SURGERY     CARDIAC CATHETERIZATION  06/07/2010   50 % ostial narrowing in 1st diagonal from LAD, 2nd diagonal had 40% ostial narrowing; AV groove circumflex was diminutive and occluded proximally; RCA totally occluded in mid segment - stented with 3.5 x 24mm Promus Element DES  - See Media tab   CARDIAC CATHETERIZATION  04/04/2013   40% mLAD, 20% mRCA at the site of the previous stent.  LVEF was 55%.   COLONOSCOPY WITH PROPOFOL N/A 02/26/2015   Procedure: COLONOSCOPY WITH PROPOFOL;  Surgeon: Elnita Maxwell, MD;  Location: [facility]  ENDOSCOPY;  Service: Endoscopy;  Laterality: N/A;   COLONOSCOPY WITH PROPOFOL N/A 03/16/2020   Procedure: COLONOSCOPY WITH PROPOFOL;  Surgeon: Regis Bill, MD;  Location: [facility] ENDOSCOPY;  Service: Endoscopy;  Laterality: N/A;   ESOPHAGOGASTRODUODENOSCOPY  02/26/2015   Procedure: ESOPHAGOGASTRODUODENOSCOPY (EGD);  Surgeon: Elnita Maxwell, MD;  Location: [facility] ENDOSCOPY;  Service: Endoscopy;;   ESOPHAGOGASTRODUODENOSCOPY (EGD) WITH PROPOFOL N/A 03/16/2020   Procedure: ESOPHAGOGASTRODUODENOSCOPY (EGD) WITH PROPOFOL;  Surgeon: Regis Bill, MD;  Location: [facility] ENDOSCOPY;  Service: Endoscopy;  Laterality: N/A;   EYE SURGERY     cataract replaced   HERNIA REPAIR     LOOP RECORDER IMPLANT  08/16/2013   Dr. Royann Shivers   RIGHT/LEFT HEART CATH AND CORONARY ANGIOGRAPHY Bilateral 06/07/2021   Procedure: RIGHT/LEFT HEART CATH AND CORONARY ANGIOGRAPHY;  Surgeon: Iran Ouch, MD;  Location: [facility] INVASIVE CV LAB;  Service: Cardiovascular;  Laterality: Bilateral;   XI ROBOTIC ASSISTED INGUINAL HERNIA REPAIR WITH MESH Left 05/23/2020   Procedure: XI ROBOTIC ASSISTED INGUINAL HERNIA REPAIR WITH MESH;  Surgeon: Carolan Shiver, MD;  Location: [facility] ORS;  Service: General;  Laterality: Left;     Current Outpatient Medications  Medication Sig Dispense Refill   ACCU-CHEK GUIDE test strip USE 1 STRIP TO TEST ONCE DAILY     aspirin 81 MG chewable tablet Chew 81 mg by mouth daily.     carvedilol (COREG) 6.25 MG tablet Take 1 tablet (6.25 mg total) by mouth 2 (two) times daily. 60 tablet 3   clopidogrel (PLAVIX) 75 MG tablet TAKE 1 TABLET(75 MG) BY MOUTH DAILY 90 tablet 3   COMBIVENT RESPIMAT 20-100 MCG/ACT AERS respimat Inhale 1 puff into the lungs every 6 (six) hours as needed for wheezing.     dapagliflozin propanediol (FARXIGA) 10 MG TABS tablet Take 1 tablet (10 mg total) by mouth daily before breakfast. 30 tablet 0   ferrous sulfate 325 (65 FE) MG EC tablet Take 1 tablet (325 mg  total) by mouth in the morning and at bedtime. 60 tablet 11   furosemide (LASIX) 20 MG tablet Take 1 tablet (20 mg total) by mouth daily. 90 tablet 3   NICOTROL NS 10 MG/ML SOLN Place into both nostrils as needed.     pantoprazole (PROTONIX) 40 MG tablet Take 40 mg by mouth daily.     PARoxetine (PAXIL) 40 MG tablet Take 40 mg by mouth daily.      pravastatin (PRAVACHOL) 80 MG tablet Take 1 tablet (80 mg total) by mouth every evening. 90 tablet 3   ramipril (ALTACE) 10 MG capsule Take 10 mg by mouth daily.     sacubitril-valsartan (ENTRESTO) 97-103 MG Take 1 tablet by mouth 2 (two) times daily. 60 tablet 5   spironolactone (ALDACTONE) 25 MG tablet Take 0.5 tablets (12.5 mg total) by mouth daily. 45 tablet 3   traZODone (DESYREL) 100 MG tablet  Take 50 mg by mouth at bedtime as needed for sleep.      No current facility-administered medications for this visit.    Allergies:   Penicillins   Social History:  The patient  reports that he quit smoking about 20 months ago. His smoking use included cigarettes. He has a 53.00 pack-year smoking history. He has never used smokeless tobacco. He reports that he does not drink alcohol and does not use drugs.   Family History:  The patient's family history includes Cancer in his mother; Coronary artery disease in his brother; Diabetes in his brother; Stroke in his maternal grandmother.    ROS:  Please see the history of present illness.   Otherwise, review of systems is positive for none.   All other systems are reviewed and negative.    PHYSICAL EXAM: VS:  There were no vitals taken for this visit. , BMI There is no height or weight on file to calculate BMI. GEN: Well nourished, well developed, in no acute distress  HEENT: normal  Neck: no JVD, carotid bruits, or masses Cardiac: ***RRR; no murmurs, rubs, or gallops,no edema  Respiratory:  clear to auscultation bilaterally, normal work of breathing GI: soft, nontender, nondistended, + BS MS: no  deformity or atrophy  Skin: warm and dry Neuro:  Strength and sensation are intact Psych: euthymic mood, full affect  EKG:  EKG {ACTION; IS/IS ZOX:[id number]} ordered today. Personal review of the ekg ordered *** shows ***  Recent Labs: 03/22/2021: Magnesium 2.5 04/19/2021: ALT 11; BNP 60.4 05/30/2021: BUN 21; Creatinine, Ser 1.04; Hemoglobin 14.7; Platelets 280; Potassium 4.9; Sodium 140    Lipid Panel     Component Value Date/Time   CHOL 115 12/22/2019 0908   CHOL 110 05/22/2013 0526   TRIG 66 12/22/2019 0908   TRIG 162 05/22/2013 0526   HDL 46 12/22/2019 0908   HDL 17 (L) 05/22/2013 0526   CHOLHDL 2.5 12/22/2019 0908   CHOLHDL 3.1 10/08/2016 0507   VLDL 17 10/08/2016 0507   VLDL 32 05/22/2013 0526   LDLCALC 55 12/22/2019 0908   LDLCALC 61 05/22/2013 0526   LDLDIRECT 67 04/19/2021 1126     Wt Readings from Last 3 Encounters:  06/25/21 187 lb 12.8 oz (85.2 kg)  06/07/21 180 lb (81.6 kg)  05/30/21 189 lb 6.4 oz (85.9 kg)      Other studies Reviewed: Additional studies/ records that were reviewed today include: TTE 03/20/21  Review of the above records today demonstrates:   1. Left ventricular ejection fraction, by estimation, is 35 to 40%. The  left ventricle has moderately decreased function. The left ventricle  demonstrates regional wall motion abnormalities (see scoring  diagram/findings for description). Left ventricular   diastolic parameters are consistent with Grade I diastolic dysfunction  (impaired relaxation). There is LV global hypokinesis with inferior and  inferolateral wall akinesis.   2. Right ventricular systolic function is normal. The right ventricular  size is normal.   3. The mitral valve is normal in structure. Mild mitral valve  regurgitation.   4. The aortic valve is tricuspid. Aortic valve regurgitation is not  visualized. Aortic valve sclerosis is present, with no evidence of aortic  valve stenosis.   5. Aortic dilatation noted. There is mild  dilatation of the aortic root,  measuring 40 mm.   6. The inferior vena cava is normal in size with <50% respiratory  variability, suggesting right atrial pressure of 8 mmHg.   RHC/LHC 06/07/21   Prox Cx to  Mid Cx lesion is 40% stenosed.   Prox RCA to Mid RCA lesion is 10% stenosed.   There is moderate left ventricular systolic dysfunction.   LV end diastolic pressure is normal.  ASSESSMENT AND PLAN:  1.  Chronic systolic heart failure: Likely due to mixed ischemic and nonischemic cardiomyopathy.  Currently on optimal medical therapy with Entresto, carvedilol, Aldactone, Farxiga.  He has a very broad QRS at 180 ms.  He would potentially benefit from resynchronization pacing.  Risk and benefits were discussed risk of bleeding, tamponade, infection, pneumothorax, lead dislodgment.  He understands these risks and is agreed to the procedure.  2.  Coronary artery disease: Status post RCA stent.  Continue aspirin, statin, beta-blocker per primary cardiology.  3.  History of ischemic stroke: Currently on aspirin and Plavix.  4.  Hyperlipidemia: Management per primary cardiology  5.  Hypertension:***   Current medicines are reviewed at length with the patient today.   The patient {ACTIONS; HAS/DOES NOT HAVE:19233} concerns regarding his medicines.  The following changes were made today:  {NONE DEFAULTED:18576}  Labs/ tests ordered today include: *** No orders of the defined types were placed in this encounter.    Disposition:   FU with Shenequa Howse {gen number [id number]} {Days to years:10300}  Signed, Alainah Phang Jorja Loa, MD  07/28/2021 3:57 PM     [facility] [address] [place] [address] [phone] (office) [phone] (fax)

## 2021-07-28 NOTE — H&P (View-Only) (Signed)
Electrophysiology Office Note   Date:  07/29/2021   ID:  Louis Gutierrez, DOB [birth year], MRN [id number]  PCP:  Kirk Ruths, Gutierrez  Cardiologist:  [place] Primary Electrophysiologist:  Bobby Ragan Gutierrez [place], Gutierrez    Chief Complaint: CHF   History of Present Illness: Louis Gutierrez is a 71 y.o. male who is being seen today for the evaluation of CHF at the request of Louis Gutierrez. Presenting today for electrophysiology evaluation.  He has a history significant for coronary artery disease, chronic systolic heart failure, COPD, cryptogenic stroke.  He presented to the hospital with STEMI in 2012 and underwent DES to the RCA.  He was readmitted to [facility] with severe shortness of breath.  Repeat echo showed an ejection fraction of 35 to 40%.  He has NYHA class II-III dyspnea on exertion.  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  He is currently on optimal medical therapy for his heart failure.  Today, he denies symptoms of palpitations, chest pain, orthopnea, PND, lower extremity edema, claudication, dizziness, presyncope, syncope, bleeding, or neurologic sequela. The patient is tolerating medications without difficulties.  He feels weak, fatigued, short of breath.  He has been taking his heart failure medications, though he continues to have these symptoms.   Past Medical History:  Diagnosis Date   Adrenal adenoma, left    Anxiety    Aortic atherosclerosis (HCC)    Asthma without status asthmaticus    Atherosclerotic cerebrovascular disease    Overview:  Small and facial weakness with minimal weakness    CAD (coronary artery disease)    a. 05/2010 Inf STEMI/PCI: RCA 140m(3.5x24 Promus DES), LCX small, 100, D1 50ost, D2 40ost, EF 45-50%; b. 03/2013 Cath ([facility]): 40% mLAD, 20% stenosis mRCA (site of the previous stent); LVEF 55%.   Cervical disc disease    a. s/p C3-4 fusion 2003.   [place]    a. 09/2016 Admit  w/ left sided wkns->found to have progressive cervical disc dzs above and below prior fusioin site; b. 10/2016 s/p ant cervical diskectomy.   COPD [place] obstructive [place] disease) [place]    [place]    s/p acute L MCA territory infarct; b. 07/2013 s/p MDT Linq ILR-->No AFib to documented to date (09/2016).   Current use of long term anticoagulation    DAPT therapy (ASA + clopidogrel)   Depression    Diabetes mellitus without complication (HCC)    Essential (primary) hypertension    Gastritis    GERD (gastroesophageal reflux disease)    H/O insomnia    History of cerebrovascular accident (CVA) with residual deficit 2015   facial drooping on one side of face is only residual since stroke   IDA (iron deficiency anemia)    Inguinal hernia    Insomnia disorder    Ischemic cardiomyopathy    a. 05/2010 LV gram: EF 45-50% @ time of inf MI; b. 09/2016 Echo: EF 50-55%, inf HK, Gr1 DD, mild MR.   Mixed hyperlipidemia    Nephrolithiasis    Obesity    OSA (obstructive sleep apnea)    no cpap   Osteoarthritis    [place]   1 stent   Tobacco abuse    Urinary incontinence    Varicose veins    Vertigo    Past Surgical History:  Procedure Laterality Date   ANTERIOR CERVICAL DECOMP/DISCECTOMY FUSION N/A 10/22/2016  Procedure: ANTERIOR CERVICAL DECOMPRESSION/DISCECTOMY FUSION 2 LEVELS C3-4, C6-7;  Surgeon: Meade Maw, Gutierrez;  Location: [facility];  Service: Neurosurgery;  Laterality: N/A;   BACK SURGERY     CARDIAC CATHETERIZATION  06/07/2010   50 % ostial narrowing in 1st diagonal from LAD, 2nd diagonal had 40% ostial narrowing; AV groove circumflex was diminutive and occluded proximally; RCA totally occluded in mid segment - stented with 3.5 x 26m Promus Element DES  - See Media tab   CARDIAC CATHETERIZATION  04/04/2013   40% mLAD, 20% mRCA at the site of the previous stent.  LVEF was 55%.   COLONOSCOPY WITH  PROPOFOL N/A 02/26/2015   Procedure: COLONOSCOPY WITH PROPOFOL;  Surgeon: MJosefine Class Gutierrez;  [facility]  Service: Endoscopy;  Laterality: N/A;   COLONOSCOPY WITH PROPOFOL N/A 03/16/2020   Procedure: COLONOSCOPY WITH PROPOFOL;  Surgeon: LLesly Rubenstein Gutierrez;  Location: [facility] ENDOSCOPY;  Service: Endoscopy;  Laterality: N/A;   ESOPHAGOGASTRODUODENOSCOPY  02/26/2015   Procedure: ESOPHAGOGASTRODUODENOSCOPY (EGD);  Surgeon: MJosefine Class Gutierrez;  [facility];  Service: Endoscopy;;   ESOPHAGOGASTRODUODENOSCOPY (EGD) WITH PROPOFOL N/A 03/16/2020   Procedure: ESOPHAGOGASTRODUODENOSCOPY (EGD) WITH PROPOFOL;  Surgeon: LLesly Rubenstein Gutierrez;  Location: [facility];  Service: Endoscopy;  Laterality: N/A;   EYE SURGERY     cataract replaced   HERNIA REPAIR     LOOP RECORDER IMPLANT  08/16/2013   Dr. CSallyanne Kuster  RIGHT/LEFT HEART CATH AND CORONARY ANGIOGRAPHY Bilateral 06/07/2021   Procedure: RIGHT/LEFT HEART CATH AND CORONARY ANGIOGRAPHY;  Surgeon: AWellington Hampshire Gutierrez;  [facility];  Service: Cardiovascular;  Laterality: Bilateral;   XI ROBOTIC ASSISTED INGUINAL HERNIA REPAIR WITH MESH Left 05/23/2020   Procedure: XI ROBOTIC ASSISTED INGUINAL HERNIA REPAIR WITH MESH;  Surgeon: CHerbert Pun Gutierrez;  Location: [facility];  Service: General;  Laterality: Left;     Current Outpatient Medications  Medication Sig Dispense Refill   ACCU-CHEK GUIDE test strip USE 1 STRIP TO TEST ONCE DAILY     aspirin 81 MG chewable tablet Chew 81 mg by mouth daily.     carvedilol (COREG) 6.25 MG tablet Take 1 tablet (6.25 mg total) by mouth 2 (two) times daily. 60 tablet 3   clopidogrel (PLAVIX) 75 MG tablet TAKE 1 TABLET(75 MG) BY MOUTH DAILY 90 tablet 3   COMBIVENT RESPIMAT 20-100 MCG/ACT AERS respimat Inhale 1 puff into the lungs every 6 (six) hours as needed for wheezing.     dapagliflozin propanediol (FARXIGA) 10 MG TABS tablet Take 1 tablet (10 mg  total) by mouth daily before breakfast. 30 tablet 0   ferrous sulfate 325 (65 FE) MG EC tablet Take 1 tablet (325 mg total) by mouth in the morning and at bedtime. 60 tablet 11   furosemide (LASIX) 20 MG tablet Take 1 tablet (20 mg total) by mouth daily. 90 tablet 3   NICOTROL NS 10 MG/ML SOLN Place into both nostrils as needed.     pantoprazole (PROTONIX) 40 MG tablet Take 40 mg by mouth daily.     PARoxetine (PAXIL) 40 MG tablet Take 40 mg by mouth daily.      pravastatin (PRAVACHOL) 80 MG tablet Take 1 tablet (80 mg total) by mouth every evening. 90 tablet 3   sacubitril-valsartan (ENTRESTO) 97-103 MG Take 1 tablet by mouth 2 (two) times daily. 60 tablet 5   spironolactone (ALDACTONE) 25 MG tablet Take 0.5 tablets (12.5 mg total) by mouth daily. 45 tablet 3   traZODone (  DESYREL) 100 MG tablet Take 50 mg by mouth at bedtime as needed for sleep.      No current facility-administered medications for this visit.    Allergies:   Penicillins   Social History:  The patient  reports that he quit smoking about 20 months ago. His smoking use included cigarettes. He has a 53.00 pack-year smoking history. He has never used smokeless tobacco. He reports that he does not drink alcohol and does not use drugs.   Family History:  The patient's family history includes Cancer in his mother; Coronary artery disease in his brother; Diabetes in his brother; Stroke in his maternal grandmother.    ROS:  Please see the history of present illness.   Otherwise, review of systems is positive for none.   All other systems are reviewed and negative.    PHYSICAL EXAM: VS:  BP (!) 108/22   Pulse 67   Ht '5\' 11"'$  (1.803 m)   Wt 186 lb (84.4 kg)   SpO2 92%   BMI 25.94 kg/m  , BMI Body mass index is 25.94 kg/m. GEN: Well nourished, well developed, in no acute distress  HEENT: normal  Neck: no JVD, carotid bruits, or masses Cardiac: RRR; no murmurs, rubs, or gallops,no edema  Respiratory:  clear to auscultation  bilaterally, normal work of breathing GI: soft, nontender, nondistended, + BS MS: no deformity or atrophy  Skin: warm and dry Neuro:  Strength and sensation are intact Psych: euthymic mood, full affect  EKG:  EKG is not ordered today. Personal review of the ekg ordered 06/25/21 shows sinus rhythm, left bundle branch block, rate 69  Recent Labs: 03/22/2021: Magnesium 2.5 04/19/2021: ALT 11; BNP 60.4 05/30/2021: BUN 21; Creatinine, Ser 1.04; Hemoglobin 14.7; Platelets 280; Potassium 4.9; Sodium 140    Lipid Panel     Component Value Date/Time   CHOL 115 12/22/2019 0908   CHOL 110 05/22/2013 0526   TRIG 66 12/22/2019 0908   TRIG 162 05/22/2013 0526   HDL 46 12/22/2019 0908   HDL 17 (L) 05/22/2013 0526   CHOLHDL 2.5 12/22/2019 0908   CHOLHDL 3.1 10/08/2016 0507   VLDL 17 10/08/2016 0507   VLDL 32 05/22/2013 0526   LDLCALC 55 12/22/2019 0908   LDLCALC 61 05/22/2013 0526   LDLDIRECT 67 04/19/2021 1126     Wt Readings from Last 3 Encounters:  07/29/21 186 lb (84.4 kg)  06/25/21 187 lb 12.8 oz (85.2 kg)  06/07/21 180 lb (81.6 kg)      Other studies Reviewed: Additional studies/ records that were reviewed today include: TTE 03/20/21  Review of the above records today demonstrates:   1. Left ventricular ejection fraction, by estimation, is 35 to 40%. The  left ventricle has moderately decreased function. The left ventricle  demonstrates regional wall motion abnormalities (see scoring  diagram/findings for description). Left ventricular   diastolic parameters are consistent with Grade I diastolic dysfunction  (impaired relaxation). There is LV global hypokinesis with inferior and  inferolateral wall akinesis.   2. Right ventricular systolic function is normal. The right ventricular  size is normal.   3. The mitral valve is normal in structure. Mild mitral valve  regurgitation.   4. The aortic valve is tricuspid. Aortic valve regurgitation is not  visualized. Aortic valve  sclerosis is present, with no evidence of aortic  valve stenosis.   5. Aortic dilatation noted. There is mild dilatation of the aortic root,  measuring 40 mm.   6. The inferior vena  cava is normal in size with <50% respiratory  variability, suggesting right atrial pressure of 8 mmHg.   RHC/LHC 06/07/21   Prox Cx to Mid Cx lesion is 40% stenosed.   Prox RCA to Mid RCA lesion is 10% stenosed.   There is moderate left ventricular systolic dysfunction.   LV end diastolic pressure is normal.  ASSESSMENT AND PLAN:  1.  Chronic systolic heart failure: Likely due to mixed ischemic and nonischemic cardiomyopathy.  Currently on optimal medical therapy with Entresto, carvedilol, Aldactone, Farxiga.  He has a very broad QRS at 180 ms.  He would potentially benefit from resynchronization pacing.  Risk and benefits were discussed risk of bleeding, tamponade, infection, pneumothorax, lead dislodgment.  He understands these risks and is agreed to the procedure.  2.  Coronary artery disease: Status post RCA stent.  Continue aspirin, statin, beta-blocker per primary cardiology.  3.  History of ischemic stroke: Currently on aspirin and Plavix.  4.  Hyperlipidemia: Management per primary cardiology  5.  Hypertension: well controlled  Case discussed with primary cardiology  Current medicines are reviewed at length with the patient today.   The patient does not have concerns regarding his medicines.  The following changes were made today:  none  Labs/ tests ordered today include:  Orders Placed This Encounter  Procedures   Basic metabolic panel   CBC     Disposition:   FU with Li Bobo 3 months  Signed, Surafel Hilleary Gutierrez Leeds, Gutierrez  07/29/2021 10:49 AM     [facility] [address] [place] [address] [phone] (office) [phone] (fax)

## 2021-07-29 ENCOUNTER — Encounter: Payer: Self-pay | Admitting: Cardiology

## 2021-07-29 ENCOUNTER — Encounter: Payer: Self-pay | Admitting: *Deleted

## 2021-07-29 ENCOUNTER — Encounter: Payer: Self-pay | Admitting: Cardiovascular Disease

## 2021-07-29 ENCOUNTER — Ambulatory Visit (INDEPENDENT_AMBULATORY_CARE_PROVIDER_SITE_OTHER): Payer: Medicare HMO | Admitting: Cardiology

## 2021-07-29 VITALS — BP 108/22 | HR 67 | Ht 71.0 in | Wt 186.0 lb

## 2021-07-29 DIAGNOSIS — Z01812 Encounter for preprocedural laboratory examination: Secondary | ICD-10-CM

## 2021-07-29 DIAGNOSIS — I5022 Chronic systolic (congestive) heart failure: Secondary | ICD-10-CM | POA: Diagnosis not present

## 2021-07-29 LAB — CBC
Hematocrit: 44.5 % (ref 37.5–51.0)
Hemoglobin: 14.7 g/dL (ref 13.0–17.7)
MCH: 29.8 pg (ref 26.6–33.0)
MCHC: 33 g/dL (ref 31.5–35.7)
MCV: 90 fL (ref 79–97)
Platelets: 252 10*3/uL (ref 150–450)
RBC: 4.93 x10E6/uL (ref 4.14–5.80)
RDW: 13.5 % (ref 11.6–15.4)
WBC: 6.2 10*3/uL (ref 3.4–10.8)

## 2021-07-29 LAB — BASIC METABOLIC PANEL
BUN/Creatinine Ratio: 18 (ref 10–24)
BUN: 17 mg/dL (ref 8–27)
CO2: 30 mmol/L — ABNORMAL HIGH (ref 20–29)
Calcium: 9.7 mg/dL (ref 8.6–10.2)
Chloride: 103 mmol/L (ref 96–106)
Creatinine, Ser: 0.96 mg/dL (ref 0.76–1.27)
Glucose: 106 mg/dL — ABNORMAL HIGH (ref 70–99)
Potassium: 4.5 mmol/L (ref 3.5–5.2)
Sodium: 137 mmol/L (ref 134–144)
eGFR: 85 mL/min/{1.73_m2} (ref >59)

## 2021-07-29 NOTE — Telephone Encounter (Signed)
Apologized to pt for the mishap. Informed that plan is for CRT-P and not defibrillator. Apologized for the confusion. Pt appreciates by call

## 2021-07-29 NOTE — Patient Instructions (Signed)
Medication Instructions:  Your physician recommends that you continue on your current medications as directed. Please refer to the Current Medication list given to you today.     * If you need a refill on your cardiac medications before your next appointment, please call your pharmacy. *   Labwork: Pre procedure lab work today: BMET & CBC w/ diff  If you have labs (blood work) drawn today and your tests are completely normal, you will receive your results only by: [place] (if you have MyChart) OR A paper copy in the mail If you have any lab test that is abnormal or we need to change your treatment, we will call you to review the results.   Testing/Procedures: Your physician has recommended that you have a defibrillator inserted. An implantable cardioverter defibrillator (ICD) is a small device that is placed in your chest or, in rare cases, your abdomen. This device uses electrical pulses or shocks to help control life-threatening, irregular heartbeats that could lead the heart to suddenly stop beating (sudden cardiac arrest). Leads are attached to the ICD that goes into your heart. This is done in the hospital and usually requires an overnight stay. Please follow the instructions below, located under the special instructions section.   Follow-Up: Your physician recommends that you schedule a wound check appointment 10-14 days, after your procedure on 07/30/21, with the device clinic.  Your physician recommends that you schedule a follow up appointment in 91 days, after your procedure on 07/30/21, with Dr. Curt Bears.   Thank you for choosing [facility]!!   Trinidad Curet, RN [phone]   Other Instructions   Biventricular Pacemaker Implantation Biventricular pacemaker implantation is a procedure to place (implant) a pacemaker near the heart. A biventricular pacemaker is a type of pacemaker used in people with heart failure due to weak heart muscles. This procedure may be  done to: Treat symptoms of severe heart failure, such as shortness of breath. Correct a heartbeat that may be too slow or irregular. A pacemaker is a small, battery-powered device that helps control the heartbeat. If the heart beats irregularly or too slowly, the pacemaker will pace the heart so that it beats at a normal rate or a programmed rate. A biventricular pacemaker will synchronize the lower chambers of the heart (ventricles) so they contract at the same time. This can help them pump more efficiently. The parts of a biventricular pacemaker include: The pulse generator. The pulse generator contains a small computer that is programmed to keep the heart beating at a certain rate. The pulse generator also produces the electrical signal that triggers the heart to beat. This is implanted under the skin of the upper chest, near the collarbone. Wires (leads). There may be two or three leads placed in the heart--one to the right atrium, one to the right ventricle, and one through the coronary sinus to reach the left ventricle of the heart. The leads are connected to the pulse generator. They transmit electrical pulses from the pulse generator to the heart. Tell a health care provider about: Any allergies you have. All medicines you are taking, including vitamins, herbs, eye drops, creams, and over-the-counter medicines. Any problems you or family members have had with anesthetic medicines. Any bleeding problems you have. Any surgeries you have had. Any medical conditions you have. Whether you are pregnant or may be pregnant. What are the risks? Generally, this is a safe procedure. However, problems may occur, including: Infection. Swelling, bruising, or bleeding at the  pacemaker site, especially if you take blood thinners. Allergic reactions to medicines or dyes. Damage to blood vessels or nerves near the pacemaker. Failure of the pacemaker to improve your condition. Collapsed lung. Blood  clots. Lead failures. This may require more surgery. What happens before the procedure? Staying hydrated Follow instructions from your health care provider about hydration, which may include: Up to 2 hours before the procedure - you may continue to drink clear liquids, such as water, clear fruit juice, black coffee, and plain tea.  Eating and drinking restrictions Follow instructions from your health care provider about eating and drinking, which may include: 8 hours before the procedure - stop eating heavy meals or foods, such as meat, fried foods, or fatty foods. 6 hours before the procedure - stop eating light meals or foods, such as toast or cereal. 6 hours before the procedure - stop drinking milk or drinks that contain milk. 2 hours before the procedure - stop drinking clear liquids. Medicines Ask your health care provider about: Changing or stopping your regular medicines. This is especially important if you are taking diabetes medicines or blood thinners. Taking medicines such as aspirin and ibuprofen. These medicines can thin your blood. Do not take these medicines unless your health care provider tells you to take them. Taking over-the-counter medicines, vitamins, herbs, and supplements. General instructions Take steps to improve your health and fitness as told. Stopping smoking, eating a healthy diet, and exercising regularly can help speed up your recovery time and reduce the risk of complications. You may have tests, including: Blood tests. Chest X-rays. Echocardiogram. This is a test that uses sound waves (ultrasound) to produce an image of the heart. Ask your health care provider: How your surgery site will be marked. What steps will be taken to help prevent infection. These may include: Removing hair at the surgery site. Washing skin with a germ-killing soap. Taking antibiotic medicine. Plan to have a responsible adult take you home from the hospital or clinic. If you  will be going home right after the procedure, plan to have a responsible adult care for you for the time you are told. This is important. What happens during the procedure?  An IV will be inserted into one of your veins. You will be connected to a heart monitor. Large electrode pads will be placed on the front and back of your chest. You will be given one or more of the following: A medicine to help you relax (sedative). A medicine to make you fall asleep (general anesthetic). A medicine that is injected into an area of your body to numb the area (local anesthetic). An incision will be made in your upper chest, near your heart. The leads will be guided into your incision, through your blood vessels, and into your heart. Your health care provider will use an X-ray machine (fluoroscope) to guide the leads into your heart. The leads will be attached to your heart muscles and to the pulse generator. The leads will be tested to make sure that they work correctly. The pulse generator will be implanted under your skin, near your incision. The heart monitor will be watched to ensure that the pacemaker is working correctly. Your incision will be closed with stitches (sutures), skin glue, or adhesive tape. A bandage (dressing) will be placed over your incision. The procedure may vary among health care providers and hospitals. What happens after the procedure? Your blood pressure, heart rate, breathing rate, and blood oxygen level will be monitored  until you leave the hospital or clinic. You may continue to receive fluids and medicines through an IV. You will be given pain medicine as needed. You will have a chest X-ray done. This is to make sure that your pacemaker is in the right place. You will be given a pacemaker identification card. This card lists the implant date, device model, and manufacturer of your pacemaker. If you were given a sedative during the procedure, it can affect you for several  hours. Do not drive or operate machinery until your health care provider says that it is safe. Summary A pacemaker is a small, battery-powered device that helps control the heartbeat. A biventricular pacemaker is used in people with heart failure to get the ventricles of the heart to pump more efficiently. Follow instructions from your health care provider about taking medicines and about eating and drinking before the procedure. You will be given a pacemaker identification card that lists the implant date, device model, and manufacturer of your pacemaker. This information is not intended to replace advice given to you by your health care provider. Make sure you discuss any questions you have with your health care provider. Document Revised: 05/04/2020 Document Reviewed: 05/04/2020 Elsevier Patient Education  [employer].

## 2021-07-29 NOTE — Pre-Procedure Instructions (Signed)
Instructed patient on the following items: Arrival time 1130 Nothing to eat or drink after midnight No meds AM of procedure Responsible person to drive you home and stay with you for 24 hrs Wash with special soap night before and morning of procedure If on anti-coagulant drug instructions Plavix- hold tomorrow morning

## 2021-07-30 ENCOUNTER — Ambulatory Visit (HOSPITAL_COMMUNITY): Payer: Medicare HMO

## 2021-07-30 ENCOUNTER — Ambulatory Visit (HOSPITAL_COMMUNITY): Payer: Medicare HMO | Admitting: Certified Registered Nurse Anesthetist

## 2021-07-30 ENCOUNTER — Encounter (HOSPITAL_COMMUNITY)
Admission: RE | Disposition: A | Discharge status: Home / Self Care | Payer: Self-pay | Source: Home / Self Care | Attending: Cardiology

## 2021-07-30 ENCOUNTER — Encounter (HOSPITAL_COMMUNITY): Payer: Self-pay | Admitting: Cardiology

## 2021-07-30 ENCOUNTER — Ambulatory Visit (HOSPITAL_COMMUNITY)
Admission: RE | Admit: 2021-07-30 | Discharge: 2021-07-30 | Disposition: A | Discharge status: Home / Self Care | Payer: Medicare HMO | Attending: Cardiology | Admitting: Cardiology

## 2021-07-30 DIAGNOSIS — K219 Gastro-esophageal reflux disease without esophagitis: Secondary | ICD-10-CM | POA: Insufficient documentation

## 2021-07-30 DIAGNOSIS — I252 Old myocardial infarction: Secondary | ICD-10-CM | POA: Diagnosis not present

## 2021-07-30 DIAGNOSIS — Z8673 Personal history of transient ischemic attack (TIA), and cerebral infarction without residual deficits: Secondary | ICD-10-CM | POA: Insufficient documentation

## 2021-07-30 DIAGNOSIS — Z87891 Personal history of nicotine dependence: Secondary | ICD-10-CM | POA: Insufficient documentation

## 2021-07-30 DIAGNOSIS — J984 Other disorders of lung: Secondary | ICD-10-CM | POA: Diagnosis not present

## 2021-07-30 DIAGNOSIS — I5022 Chronic systolic (congestive) heart failure: Secondary | ICD-10-CM | POA: Diagnosis not present

## 2021-07-30 DIAGNOSIS — I110 Hypertensive heart disease with heart failure: Secondary | ICD-10-CM | POA: Insufficient documentation

## 2021-07-30 DIAGNOSIS — Z9981 Dependence on supplemental oxygen: Secondary | ICD-10-CM | POA: Diagnosis not present

## 2021-07-30 DIAGNOSIS — G4730 Sleep apnea, unspecified: Secondary | ICD-10-CM | POA: Diagnosis not present

## 2021-07-30 DIAGNOSIS — I447 Left bundle-branch block, unspecified: Secondary | ICD-10-CM | POA: Insufficient documentation

## 2021-07-30 DIAGNOSIS — I428 Other cardiomyopathies: Secondary | ICD-10-CM | POA: Insufficient documentation

## 2021-07-30 DIAGNOSIS — I255 Ischemic cardiomyopathy: Secondary | ICD-10-CM | POA: Diagnosis not present

## 2021-07-30 DIAGNOSIS — Z955 Presence of coronary angioplasty implant and graft: Secondary | ICD-10-CM | POA: Diagnosis not present

## 2021-07-30 DIAGNOSIS — I2510 Atherosclerotic heart disease of native coronary artery without angina pectoris: Secondary | ICD-10-CM | POA: Diagnosis not present

## 2021-07-30 DIAGNOSIS — Z7984 Long term (current) use of oral hypoglycemic drugs: Secondary | ICD-10-CM | POA: Diagnosis not present

## 2021-07-30 DIAGNOSIS — R918 Other nonspecific abnormal finding of lung field: Secondary | ICD-10-CM | POA: Diagnosis not present

## 2021-07-30 DIAGNOSIS — E782 Mixed hyperlipidemia: Secondary | ICD-10-CM | POA: Diagnosis not present

## 2021-07-30 DIAGNOSIS — Z950 Presence of cardiac pacemaker: Secondary | ICD-10-CM | POA: Diagnosis not present

## 2021-07-30 DIAGNOSIS — J449 Chronic obstructive pulmonary disease, unspecified: Secondary | ICD-10-CM | POA: Diagnosis not present

## 2021-07-30 DIAGNOSIS — E1136 Type 2 diabetes mellitus with diabetic cataract: Secondary | ICD-10-CM | POA: Diagnosis not present

## 2021-07-30 DIAGNOSIS — J90 Pleural effusion, not elsewhere classified: Secondary | ICD-10-CM | POA: Diagnosis not present

## 2021-07-30 HISTORY — PX: BIV PACEMAKER INSERTION CRT-P: EP1199

## 2021-07-30 SURGERY — BIV PACEMAKER INSERTION CRT-P
Anesthesia: General

## 2021-07-30 MED ORDER — LIDOCAINE HCL (PF) 1 % IJ SOLN
INTRAMUSCULAR | Status: DC | PRN
  Administered 2021-07-30: 60 mL

## 2021-07-30 MED ORDER — FENTANYL CITRATE (PF) 100 MCG/2ML IJ SOLN
INTRAMUSCULAR | Status: AC
  Filled 2021-07-30: qty 2

## 2021-07-30 MED ORDER — ACETAMINOPHEN 325 MG PO TABS: 325.0000 mg | ORAL_TABLET | ORAL | Status: DC | PRN

## 2021-07-30 MED ORDER — HEPARIN (PORCINE) IN NACL 1000-0.9 UT/500ML-% IV SOLN
INTRAVENOUS | Status: DC | PRN
  Administered 2021-07-30: 500 mL

## 2021-07-30 MED ORDER — NICOTROL NS 10 MG/ML NA SOLN: 1.0000 mg | NASAL | 0 refills | Status: DC | PRN

## 2021-07-30 MED ORDER — SODIUM CHLORIDE 0.9 % IV SOLN
INTRAVENOUS | Status: AC
  Filled 2021-07-30: qty 2

## 2021-07-30 MED ORDER — CHLORHEXIDINE GLUCONATE 4 % EX LIQD
4.0000 | Freq: Once | CUTANEOUS | Status: DC
  Filled 2021-07-30: qty 60

## 2021-07-30 MED ORDER — FENTANYL CITRATE (PF) 100 MCG/2ML IJ SOLN
INTRAMUSCULAR | Status: DC | PRN
  Administered 2021-07-30 (×2): 25 ug via INTRAVENOUS

## 2021-07-30 MED ORDER — SODIUM CHLORIDE 0.9 % IV SOLN: INTRAVENOUS | Status: DC

## 2021-07-30 MED ORDER — HEPARIN (PORCINE) IN NACL 1000-0.9 UT/500ML-% IV SOLN
INTRAVENOUS | Status: AC
  Filled 2021-07-30: qty 500

## 2021-07-30 MED ORDER — SODIUM CHLORIDE 0.9 % IV SOLN
80.0000 mg | INTRAVENOUS | Status: AC
  Administered 2021-07-30: 80 mg

## 2021-07-30 MED ORDER — CLOPIDOGREL BISULFATE 75 MG PO TABS: ORAL_TABLET | ORAL | 3 refills | Status: DC

## 2021-07-30 MED ORDER — VANCOMYCIN HCL IN DEXTROSE 1-5 GM/200ML-% IV SOLN
1000.0000 mg | INTRAVENOUS | Status: AC
  Administered 2021-07-30: 1000 mg via INTRAVENOUS

## 2021-07-30 MED ORDER — MIDAZOLAM HCL 5 MG/5ML IJ SOLN
INTRAMUSCULAR | Status: AC
  Filled 2021-07-30: qty 5

## 2021-07-30 MED ORDER — VANCOMYCIN HCL IN DEXTROSE 1-5 GM/200ML-% IV SOLN: 1000.0000 mg | Freq: Two times a day (BID) | INTRAVENOUS | Status: DC

## 2021-07-30 MED ORDER — ASPIRIN 81 MG PO CHEW: 81.0000 mg | CHEWABLE_TABLET | Freq: Every day | ORAL | Status: AC

## 2021-07-30 MED ORDER — IOHEXOL 350 MG/ML SOLN
INTRAVENOUS | Status: DC | PRN
  Administered 2021-07-30: 15 mL

## 2021-07-30 MED ORDER — LIDOCAINE HCL (PF) 1 % IJ SOLN
INTRAMUSCULAR | Status: AC
  Filled 2021-07-30: qty 60

## 2021-07-30 MED ORDER — VANCOMYCIN HCL IN DEXTROSE 1-5 GM/200ML-% IV SOLN
INTRAVENOUS | Status: AC
  Filled 2021-07-30: qty 200

## 2021-07-30 MED ORDER — MIDAZOLAM HCL 5 MG/5ML IJ SOLN
INTRAMUSCULAR | Status: DC | PRN
  Administered 2021-07-30 (×2): 1 mg via INTRAVENOUS

## 2021-07-30 MED ORDER — ONDANSETRON HCL 4 MG/2ML IJ SOLN: 4.0000 mg | Freq: Four times a day (QID) | INTRAMUSCULAR | Status: DC | PRN

## 2021-07-30 SURGICAL SUPPLY — 17 items
BALLN COR SINUS VENO 6FR 80 (BALLOONS) ×2
BALLOON COR SINUS VENO 6FR 80 (BALLOONS) IMPLANT
CABLE SURGICAL S-101-97-12 (CABLE) ×2 IMPLANT
CATH CPS DIRECT 135 DS2C020 (CATHETERS) ×1 IMPLANT
CATH JOSEPH QUAD ALLRED 6F REP (CATHETERS) ×1 IMPLANT
LEAD QUARTET 1458Q-86CM (Lead) ×1 IMPLANT
LEAD TENDRIL MRI 52CM LPA1200M (Lead) ×1 IMPLANT
LEAD TENDRIL MRI 58CM LPA1200M (Lead) ×1 IMPLANT
PACEMAKER QUDR ALLR CRT PM3562 (Pacemaker) IMPLANT
PAD DEFIB RADIO PHYSIO CONN (PAD) ×2 IMPLANT
PMKR QUADRA ALLURE CRT PM3562 (Pacemaker) ×2 IMPLANT
SHEATH 8FR PRELUDE SNAP 13 (SHEATH) ×2 IMPLANT
SHEATH PROBE COVER 6X72 (BAG) ×1 IMPLANT
SLITTER AGILIS HISPRO (INSTRUMENTS) ×1 IMPLANT
TRAY PACEMAKER INSERTION (PACKS) ×2 IMPLANT
WIRE ACUITY WHISPER EDS 4648 (WIRE) ×1 IMPLANT
WIRE HI TORQ VERSACORE-J 145CM (WIRE) ×1 IMPLANT

## 2021-07-30 NOTE — Discharge Instructions (Signed)
After Your Pacemaker   You have a [employer] Pacemaker  ACTIVITY Do not lift your arm above shoulder height for 1 week after your procedure. After 7 days, you may progress as below.  You should remove your sling 24 hours after your procedure, unless otherwise instructed by your provider.     Tuesday August 06, 2021  Wednesday August 07, 2021 Thursday August 08, 2021 Friday August 09, 2021   Do not lift, push, pull, or carry anything over 10 pounds with the affected arm until 6 weeks (Tuesday September 10, 2021 ) after your procedure.   You may drive AFTER your wound check, unless you have been told otherwise by your provider.   Ask your healthcare provider when you can go back to work   INCISION/Dressing If you are on a blood thinner such as Coumadin, Xarelto, Eliquis, Plavix, or Pradaxa please confirm with your provider when this should be resumed.   If large square, outer bandage is left in place, this can be removed after 24 hours from your procedure. Do not remove steri-strips or glue as below.   Monitor your Pacemaker site for redness, swelling, and drainage. Call the device clinic at [phone] if you experience these symptoms or fever/chills.  If your incision is sealed with Steri-strips or staples, you may shower 7 days after your procedure or when told by your provider. Do not remove the steri-strips or let the shower hit directly on your site. You may wash around your site with soap and water.    If you were discharged in a sling, please do not wear this during the day more than 48 hours after your surgery unless otherwise instructed. This may increase the risk of stiffness and soreness in your shoulder.   Avoid lotions, ointments, or perfumes over your incision until it is well-healed.  You may use a hot tub or a pool AFTER your wound check appointment if the incision is completely closed.  Pacemaker Alerts:  Some alerts are vibratory and others beep. These are NOT emergencies.  Please call our office to let us know. If this occurs at night or on weekends, it can wait until the next business day. Send a remote transmission.  If your device is capable of reading fluid status (for heart failure), you will be offered monthly monitoring to review this with you.   DEVICE MANAGEMENT Remote monitoring is used to monitor your pacemaker from home. This monitoring is scheduled every 91 days by our office. It allows [place] to keep an eye on the functioning of your device to ensure it is working properly. You will routinely see your Electrophysiologist annually (more often if necessary).   You should receive your ID card for your new device in 4-8 weeks. Keep this card with you at all times once received. Consider wearing a medical alert bracelet or necklace.  Your Pacemaker may be MRI compatible. This will be discussed at your next office visit/wound check.  You should avoid contact with strong electric or magnetic fields.   Do not use amateur (ham) radio equipment or electric (arc) welding torches. MP3 player headphones with magnets should not be used. Some devices are safe to use if held at least 12 inches (30 cm) from your Pacemaker. These include power tools, lawn mowers, and speakers. If you are unsure if something is safe to use, ask your health care provider.  When using your cell phone, hold it to the ear that is on the opposite side from  the Pacemaker. Do not leave your cell phone in a pocket over the Pacemaker.  You may safely use electric blankets, heating pads, computers, and microwave ovens.  Call the office right away if: You have chest pain. You feel more short of breath than you have felt before. You feel more light-headed than you have felt before. Your incision starts to open up.  This information is not intended to replace advice given to you by your health care provider. Make sure you discuss any questions you have with your health care provider.

## 2021-07-30 NOTE — Anesthesia Preprocedure Evaluation (Signed)
Anesthesia Evaluation  Patient identified by MRN, date of birth, ID band Patient awake    Reviewed: Allergy & Precautions, NPO status , Patient's Chart, lab work & pertinent test results  Airway        Dental   Pulmonary asthma , sleep apnea , COPD,  COPD inhaler and oxygen dependent, former smoker,  On 2L [place] oxygen at night          Cardiovascular hypertension, Pt. on medications and Pt. on home beta blockers + CAD, + Past MI, + Cardiac Stents and +CHF    ECHO: Left ventricular ejection fraction, by estimation, is 35 to 40%. The left ventricle has moderately decreased function. The left ventricle  demonstrates regional wall motion abnormalities (see scoring diagram/findings for description). Left ventricular  diastolic parameters are consistent with Grade I diastolic dysfunction  (impaired relaxation). There is LV global hypokinesis with inferior and  inferolateral wall akinesis.  2. Right ventricular systolic function is normal. The right ventricular  size is normal.  3. The mitral valve is normal in structure. Mild mitral valve  regurgitation.  4. The aortic valve is tricuspid. Aortic valve regurgitation is not  visualized. Aortic valve sclerosis is present, with no evidence of aortic  valve stenosis.  5. Aortic dilatation noted. There is mild dilatation of the aortic root,  measuring 40 mm.  6. The inferior vena cava is normal in size with <50% respiratory  variability, suggesting right atrial pressure of 8 mmHg.    Neuro/Psych PSYCHIATRIC DISORDERS Anxiety Depression CVA, Residual Symptoms    GI/Hepatic Neg liver ROS, GERD  Medicated and Controlled,  Endo/Other  diabetes  Renal/GU negative Renal ROS     Musculoskeletal  (+) Arthritis ,   Abdominal   Peds  Hematology  (+) Blood dyscrasia, ,   Anesthesia Other Findings heart failure  Reproductive/Obstetrics                              Anesthesia Physical Anesthesia Plan Anesthesia Quick Evaluation

## 2021-07-30 NOTE — Interval H&P Note (Signed)
History and Physical Interval Note:  07/30/2021 12:03 PM  Louis Gutierrez  has presented today for surgery, with the diagnosis of heart failure.  The various methods of treatment have been discussed with the patient and family. After consideration of risks, benefits and other options for treatment, the patient has consented to  Procedure(s): BIV PACEMAKER INSERTION CRT-P (N/A) as a surgical intervention.  The patient's history has been reviewed, patient examined, no change in status, stable for surgery.  I have reviewed the patient's chart and labs.  Questions were answered to the patient's satisfaction.     Kanani Mowbray [employer]

## 2021-07-31 ENCOUNTER — Telehealth: Payer: Self-pay

## 2021-07-31 ENCOUNTER — Encounter (HOSPITAL_COMMUNITY): Payer: Self-pay | Admitting: Cardiology

## 2021-07-31 NOTE — Telephone Encounter (Signed)
-----   Message from Shirley Friar, PA-C sent at 07/30/2021  3:09 PM EDT ----- Same day BIV PPM WC 7/11

## 2021-07-31 NOTE — Telephone Encounter (Signed)
Follow-up after same day discharge: Implant date: 07/30/2021  MD: Allegra Lai, MD Device: SJ PPM Location: L. Chest    Wound check visit: 08/14/2021  90 day MD follow-up: 11/08/2021  Remote Transmission received: Patient sated he would plug home monitor up tonight.  Dressing/sling removed: yes  Confirm OAC restart on: NA  Spoke with patient, patient stated that he was doing well, patient voiced understanding of no lifting over 10 pounds and not to shower until 7 days after procedure.

## 2021-08-02 ENCOUNTER — Encounter: Payer: Self-pay | Admitting: Family

## 2021-08-02 ENCOUNTER — Other Ambulatory Visit: Payer: Self-pay | Admitting: Family

## 2021-08-06 ENCOUNTER — Ambulatory Visit: Payer: Medicare HMO | Attending: *Deleted

## 2021-08-14 ENCOUNTER — Ambulatory Visit (INDEPENDENT_AMBULATORY_CARE_PROVIDER_SITE_OTHER): Payer: Medicare HMO

## 2021-08-14 DIAGNOSIS — Z950 Presence of cardiac pacemaker: Secondary | ICD-10-CM | POA: Diagnosis not present

## 2021-08-14 LAB — CUP PACEART INCLINIC DEVICE CHECK
Battery Remaining Longevity: 56 mo
Battery Voltage: 3.02 V
Brady Statistic RA Percent Paced: 5.6 %
Brady Statistic RV Percent Paced: 99.7 %
Date Time Interrogation Session: 20230726145800
Implantable Lead Implant Date: 20230711
Implantable Lead Implant Date: 20230711
Implantable Lead Implant Date: 20230711
Implantable Lead Location: 753858
Implantable Lead Location: 753859
Implantable Lead Location: 753860
Implantable Pulse Generator Implant Date: 20230711
Lead Channel Impedance Value: 437.5 Ohm
Lead Channel Impedance Value: 537.5 Ohm
Lead Channel Impedance Value: 887.5 Ohm
Lead Channel Pacing Threshold Amplitude: 0.5 V
Lead Channel Pacing Threshold Amplitude: 0.5 V
Lead Channel Pacing Threshold Amplitude: 0.5 V
Lead Channel Pacing Threshold Amplitude: 0.5 V
Lead Channel Pacing Threshold Amplitude: 2 V
Lead Channel Pacing Threshold Amplitude: 2 V
Lead Channel Pacing Threshold Pulse Width: 0.5 ms
Lead Channel Pacing Threshold Pulse Width: 0.5 ms
Lead Channel Pacing Threshold Pulse Width: 0.5 ms
Lead Channel Pacing Threshold Pulse Width: 0.5 ms
Lead Channel Pacing Threshold Pulse Width: 0.5 ms
Lead Channel Pacing Threshold Pulse Width: 0.5 ms
Lead Channel Sensing Intrinsic Amplitude: 5 mV
Lead Channel Sensing Intrinsic Amplitude: 9.3 mV
Lead Channel Setting Pacing Amplitude: 3.5 V
Lead Channel Setting Pacing Amplitude: 3.5 V
Lead Channel Setting Pacing Amplitude: 3.5 V
Lead Channel Setting Pacing Pulse Width: 0.5 ms
Lead Channel Setting Pacing Pulse Width: 0.5 ms
Lead Channel Setting Sensing Sensitivity: 2 mV
Pulse Gen Model: 3562
Pulse Gen Serial Number: 8088605

## 2021-08-14 NOTE — Progress Notes (Signed)
Wound check appointment. Steri-strips removed. Wound without redness or edema. Incision edges approximated, wound well healed. Normal device function. Thresholds, sensing, and impedances consistent with implant measurements. Device programmed at 3.5V/auto capture programmed on for extra safety margin until 3 month visit. Histogram distribution appropriate for patient and level of activity.  AT/AF burden <1%, EGMs appear AT, No high ventricular rates noted. Patient educated about wound care, arm mobility, lifting restrictions. ROV in 3 months with implanting physician.

## 2021-08-14 NOTE — Patient Instructions (Signed)
   After Your Pacemaker   Monitor your pacemaker site for redness, swelling, and drainage. Call the device clinic at [phone] if you experience these symptoms or fever/chills.  Your incision was closed with Steri-strips or staples:  You may shower 7 days after your procedure and wash your incision with soap and water. Avoid lotions, ointments, or perfumes over your incision until it is well-healed.  You may use a hot tub or a pool after your wound check appointment if the incision is completely closed.  Do not lift, push or pull greater than 10 pounds with the affected arm until 6 weeks after your procedure. There are no other restrictions in arm movement after your wound check appointment. Until AFTER AUGUST 22nd  You may drive, unless driving has been restricted by your healthcare providers.   Remote monitoring is used to monitor your pacemaker from home. This monitoring is scheduled every 91 days by our office. It allows [place] to keep an eye on the functioning of your device to ensure it is working properly. You will routinely see your Electrophysiologist annually (more often if necessary).

## 2021-08-19 ENCOUNTER — Ambulatory Visit
Admission: RE | Admit: 2021-08-19 | Discharge: 2021-08-19 | Disposition: A | Discharge status: Home / Self Care | Payer: Medicare HMO | Source: Ambulatory Visit | Attending: Urology | Admitting: Urology

## 2021-08-19 DIAGNOSIS — N281 Cyst of kidney, acquired: Secondary | ICD-10-CM | POA: Diagnosis not present

## 2021-08-19 DIAGNOSIS — N2889 Other specified disorders of kidney and ureter: Secondary | ICD-10-CM | POA: Insufficient documentation

## 2021-08-20 ENCOUNTER — Other Ambulatory Visit: Payer: Self-pay

## 2021-08-20 NOTE — Patient Outreach (Signed)
[facility]) Care Management  08/20/2021  Louis Gutierrez Jun 18, 1950 [id number]   Telephone Assessment   Successful outreach call to patient. He is doing well. Patient's voice sounds stronger and better. He reports that he had successful pacemaker insertion. He can tell a difference and feels better since surgery. He remains fairly active. No recent falls. Appetite remains good. He had recent sleep study and awaiting new device-he will follow up with MD regarding it. Denies any RN CM needs or concerns at this time.    Medications Reviewed Today     Reviewed by Hayden Pedro, RN (Registered Nurse) on 08/20/21 at [id number]  Med List Status: <None>   Medication Order Taking? Sig Documenting Provider Last Dose Status Informant  ACCU-CHEK GUIDE test strip [id number] No USE 1 STRIP TO TEST ONCE DAILY [provider] 07/29/2021 Active Self  acetaminophen (TYLENOL) 325 MG tablet [id number] No Take 650 mg by mouth every 6 (six) hours as needed for moderate pain. [provider] 07/29/2021 Active Self  aspirin 81 MG chewable tablet [id number]  Chew 1 tablet (81 mg total) by mouth daily. Shirley Friar, PA-C  Active   carvedilol (COREG) 6.25 MG tablet [id number] No Take 1 tablet (6.25 mg total) by mouth 2 (two) times daily. Alisa Graff, [place] 07/29/2021 Expired 07/30/21 2359 Self  clopidogrel (PLAVIX) 75 MG tablet [id number]  TAKE 1 TABLET(75 MG) BY MOUTH DAILY Shirley Friar, PA-C  Active   COMBIVENT RESPIMAT 20-100 MCG/ACT AERS respimat [id number] No Inhale 1 puff into the lungs every 6 (six) hours as needed for wheezing. [provider] 07/30/2021 0800 Active Self  dapagliflozin propanediol (FARXIGA) 10 MG TABS tablet [id number] No Take 1 tablet (10 mg total) by mouth daily before breakfast. Alisa Graff, FNP 07/29/2021 Active Self  ferrous sulfate 325 (65 FE) MG EC tablet [id number] No Take 1 tablet (325 mg total) by mouth in the morning  and at bedtime. Croitoru, Mihai, MD 07/29/2021 Active Self  furosemide (LASIX) 20 MG tablet [id number] No Take 1 tablet (20 mg total) by mouth daily. Loel Dubonnet, NP 07/29/2021 Expired 07/30/21 2359 Self  NICOTROL NS 10 MG/ML SOLN [id number]  Place 0.1 mLs (1 mg total) into both nostrils as needed. Shirley Friar, PA-C  Active   pantoprazole (PROTONIX) 40 MG tablet [id number] No Take 40 mg by mouth daily. [provider] 07/29/2021 Active Self  PARoxetine (PAXIL) 40 MG tablet [id number] No Take 40 mg by mouth daily.  [provider] 07/29/2021 Active Self           Med Note Jimmye Norman, Scharlene Gloss   Tue Oct 07, 2016  6:39 PM)    pravastatin (PRAVACHOL) 80 MG tablet [id number] No Take 1 tablet (80 mg total) by mouth every evening. Alisa Graff, [place] 07/29/2021 Expired 07/30/21 2359 Self  sacubitril-valsartan (ENTRESTO) 97-103 MG [id number] No Take 1 tablet by mouth 2 (two) times daily. Alisa Graff, [place] 07/29/2021 Active Self  spironolactone (ALDACTONE) 25 MG tablet [id number] No Take 0.5 tablets (12.5 mg total) by mouth daily. Loel Dubonnet, NP 07/29/2021 Expired 07/30/21 2359 Self  traZODone (DESYREL) 100 MG tablet [id number] No Take 50 mg by mouth at bedtime as needed for sleep.  [provider] Past Week Active Self           Med Note Ovidio Kin Oct 07, 2016  6:39 PM)  Care Plan : RN Care Manager POC  Updates made by Hayden Pedro, RN since 08/20/2021 12:00 AM     Problem: Chronic Disease Mgmt of Chronic Conditions(COPD,DM)   Priority: High     Long-Range Goal: Development of POC for Mgmt of Chronic Conditions-COPD,DM   Start Date: 11/21/2020  Expected End Date: 11/21/2021  This Visit's Progress: On track  Recent Progress: On track  Priority: High  Note:    Current Barriers:  Chronic Disease Management support and education needs related to COPD and DMII  RNCM Clinical Goal(s):  Patient will verbalize  understanding of plan for management of COPD and DMII verbalize basic understanding of  COPD and DMII disease process and self health management plan for chronic conditions take all medications exactly as prescribed and will call provider for medication related questions demonstrate Ongoing adherence to prescribed treatment plan for COPD and DMII as evidenced by lowering of A1C level and no minimal COPD exacerbations  through collaboration with RN Care manager, provider, and care team.   Interventions: POC sent to PCP upon initial assessment, quarterly and with any changes in patient's conditions Inter-disciplinary care team collaboration (see longitudinal plan of care) Evaluation of current treatment plan related to  self management and patient's adherence to plan as established by provider   Diabetes Interventions:  (Status:  Condition stable.  Not addressed this visit.) Long Term Goal Assessed patient's understanding of A1c goal: <7%  Lab Results  Component Value Date   HGBA1C 6.4 (H) 12/22/2019     Diabetes Interventions:  Patient reports an intentional wgt loss of about 12 lbs within the past 6 months or more as he verbalizes wgt loss with positively affect his overall health in a good way. Assessed patient's understanding of A1c goal: <7% Provided education to patient about basic DM disease process; Reviewed medications with patient and discussed importance of medication adherence; Praised patient for lowering of A1C level based on most recent results (previous A1C 6.5-April 2022) Lab Results  Component Value Date   HGBA1C 6.4 (H) 12/22/2019  05/20/2021-Wgt remains stable. Appetite reported as good to patient.    COPD Interventions:  (Status:  Goal on track:  Yes.) Long Term Goal   COPD Interventions:  Patient reports he has noticed a slight change in his breathing pattern since weather/season has changed. He is using inhalers as ordered with good relief and avoiding  triggers. Advised patient to track and manage COPD triggers;  Advised patient to self assesses COPD action plan zone and make appointment with provider if in the yellow zone for 48 hours without improvement; Assessed for vaccination/immunization status -patient received flu shot at PCP appt-11/01/20 02/21/21-Patient reports that his breathing has been fair. Weather does trigger some issues at times but sxs managed. 3/6/2-Patient has recent hospital admission for COPD exacerbation. However, he reports since returning home his sxs have improved and he is doing great. He has meds and oxygen in the home(only using prn). Denies any resp issues at present.  05/20/21-Patient reports he has quit smoking-it has been a week since last cigarette. He took med for about three does but otherwise has done it cold [place]. He has noticed improvement in his breathing. He has sleep study done but still awaiting results. Patient continues to only use oxygen when sleeping.  08/20/21-Patient reports breathing continues to be managed. He has been avoiding outdoors more due to high humidity/temp. He had sleep study and is awaiting new Bi-pap machine/setting.  Patient Goals/Self-Care Activities: Patient will self  administer medications as prescribed Patient will attend all scheduled provider appointments Patient will call provider office for new concerns or questions Patient will continue to monitor cbgs in the home and adhere to diabetic diet Patient will have no exacerbation of sxs and unplanned hospital admission over the next 90 days    Follow Up Plan:  Telephone follow up appointment with care management team member scheduled for:  quarterly-in the month of Nov The patient has been provided with contact information for the care management team and has been advised to call with any health related questions or concerns.        Plan: RN CM discussed with patient next outreach within the month of Nov. Patient agrees to  care plan and follow up.  Enzo Montgomery, RN,BSN,CCM [place] Telephonic Care Management Coordinator Direct Phone: [phone] Toll Free: [phone] Fax: [phone]

## 2021-08-23 ENCOUNTER — Other Ambulatory Visit: Payer: Self-pay | Admitting: Family

## 2021-08-24 ENCOUNTER — Other Ambulatory Visit: Payer: Self-pay | Admitting: Family

## 2021-08-24 MED ORDER — DAPAGLIFLOZIN PROPANEDIOL 10 MG PO TABS: 10.0000 mg | ORAL_TABLET | Freq: Every day | ORAL | 5 refills | Status: DC

## 2021-08-24 MED ORDER — DAPAGLIFLOZIN PROPANEDIOL 10 MG PO TABS
10.0000 mg | ORAL_TABLET | Freq: Every day | ORAL | 5 refills | Status: DC
  Filled 2021-08-24: qty 30, 30d supply, fill #0

## 2021-08-26 ENCOUNTER — Other Ambulatory Visit: Payer: Self-pay

## 2021-08-29 ENCOUNTER — Encounter: Payer: Self-pay | Admitting: Urology

## 2021-08-29 ENCOUNTER — Ambulatory Visit (INDEPENDENT_AMBULATORY_CARE_PROVIDER_SITE_OTHER): Payer: Medicare HMO | Admitting: Urology

## 2021-08-29 VITALS — BP 107/70 | HR 83 | Ht 71.0 in | Wt 177.0 lb

## 2021-08-29 DIAGNOSIS — E278 Other specified disorders of adrenal gland: Secondary | ICD-10-CM

## 2021-08-29 DIAGNOSIS — N2889 Other specified disorders of kidney and ureter: Secondary | ICD-10-CM

## 2021-08-29 NOTE — Progress Notes (Signed)
   08/29/2021 12:34 PM   Louis Gutierrez 1950-04-18 [id number]  Reason for visit: Follow up renal mass, adrenal mass, ED  HPI: Comorbid 71 year old male who was previously followed by Dr. Tresa Moore in September 2017 for a stable 3.6 cm left adrenal mass, and a small left 1.5 mm left upper pole solid renal mass since at least 2015.  We opted for surveillance at that time, and I also placed a referral to endocrinology for metabolic evaluation of the adrenal mass, but he never followed up for that.  I personally viewed and interpreted the most recent renal ultrasound dated 08/19/2021 that shows a stable 1.3 cm left upper pole renal mass that is minimally changed over the last 8 years.  Using shared decision making he opted to continue active surveillance which is very reasonable.  I also personally viewed and interpreted the CT chest from February 2023 that shows a stable 4 cm left adrenal adenoma.  He recently was diagnosed with CHF and underwent a pacemaker in the last few weeks.  He has a history of ED, unable to take PDE 5 inhibitor secondary to nitrate use.  Currently not a priority, but could consider vacuum erection device or intracavernosal injections in the future.  Reassurance provided regarding stable left renal mass, and this can be monitored with yearly renal ultrasounds.  I again placed a referral to endocrinology for metabolic workup of the larger left-sided adrenal adenoma that has been stable over the last 7+ years.  RTC 1 year renal ultrasound prior   Billey Co, MD  [facility] [address], [place], [place] [address] [phone]

## 2021-08-30 ENCOUNTER — Encounter: Payer: Self-pay | Admitting: Cardiovascular Disease

## 2021-08-31 NOTE — Progress Notes (Deleted)
Patient ID: Louis Gutierrez, male    DOB: 08/20/1950, 71 y.o.   MRN: [id number]  HPI  Louis Gutierrez is a 70 yo male patient with a PMHx of: coronary artery disease, cryptogenic stroke, HTN, DM, tobacco abuse, OSA, HF and COPD.    Admitted 2/23-03/22/21 with COPD and HF exacerbation. Subsequently diuresed, treated with steroids and d/c'd home on supplemental O2.   Echo report from 03/20/21 reviewed and showed: Left ventricular ejection fraction, by estimation, is 35 to 40%. The left ventricle has moderately decreased function. The left ventricle demonstrates regional wall motion abnormalities. The left ventricular internal cavity size was normal in size. There is no left ventricular hypertrophy. Left ventricular diastolic parameters are consistent with Grade I diastolic dysfunction (impaired relaxation).   LHC done 06/07/21 and showed:   Prox Cx to Mid Cx lesion is 40% stenosed.   Prox RCA to Mid RCA lesion is 10% stenosed.   There is moderate left ventricular systolic dysfunction.   LV end diastolic pressure is normal.  1.  Widely patent RCA stent with minimal restenosis.  No evidence of obstructive coronary artery disease. 2.  Moderately to severely reduced LV systolic function with an EF of 30 to 35%. 3.  Right heart catheterization showed normal right and left-sided filling pressures, minimal pulmonary hypertension and normal cardiac output.  He presents today for a follow-up visit with a chief complaint of   Past Medical History:  Diagnosis Date   Adrenal adenoma, left    Anxiety    Aortic atherosclerosis (HCC)    Asthma without status asthmaticus    Atherosclerotic cerebrovascular disease    Overview:  Small and facial weakness with minimal weakness    CAD (coronary artery disease)    a. 05/2010 Inf STEMI/PCI: RCA 115m(3.5x24 Promus DES), LCX small, 100, D1 50ost, D2 40ost, EF 45-50%; b. 03/2013 Cath ([facility]): 40% mLAD, 20% stenosis mRCA (site of the previous stent); LVEF 55%.    Cervical disc disease    a. s/p C3-4 fusion 2003.   Cervical myelopathy ([facility]    a. 09/2016 Admit w/ left sided wkns->found to have progressive cervical disc dzs above and below prior fusioin site; b. 10/2016 s/p ant cervical diskectomy.   COPD (chronic obstructive pulmonary disease) ([facility]    [place]    s/p acute L MCA territory infarct; b. 07/2013 s/p MDT Linq ILR-->No AFib to documented to date (09/2016).   Current use of long term anticoagulation    DAPT therapy (ASA + clopidogrel)   Depression    Diabetes mellitus without complication (HCC)    Essential (primary) hypertension    Gastritis    GERD (gastroesophageal reflux disease)    H/O insomnia    History of cerebrovascular accident (CVA) with residual deficit 2015   facial drooping on one side of face is only residual since stroke   IDA (iron deficiency anemia)    Inguinal hernia    Insomnia disorder    Ischemic cardiomyopathy    a. 05/2010 LV gram: EF 45-50% @ time of inf MI; b. 09/2016 Echo: EF 50-55%, inf HK, Gr1 DD, mild MR.   Mixed hyperlipidemia    Nephrolithiasis    Obesity    OSA (obstructive sleep apnea)    no cpap   Osteoarthritis    [place]   1 stent   Tobacco abuse    Urinary incontinence    Varicose veins    Vertigo  Past Surgical History:  Procedure Laterality Date   ANTERIOR CERVICAL DECOMP/DISCECTOMY FUSION N/A 10/22/2016   Procedure: ANTERIOR CERVICAL DECOMPRESSION/DISCECTOMY FUSION 2 LEVELS C3-4, C6-7;  Surgeon: Meade Maw, MD;  Location: [facility];  Service: Neurosurgery;  Laterality: N/A;   BACK SURGERY     BIV PACEMAKER INSERTION CRT-P N/A 07/30/2021   Procedure: BIV PACEMAKER INSERTION CRT-P;  Surgeon: Constance Haw, MD;  Location: [place] [facility];  Service: Cardiovascular;  Laterality: N/A;   CARDIAC CATHETERIZATION  06/07/2010   50 % ostial narrowing in 1st diagonal from LAD, 2nd diagonal had 40% ostial  narrowing; AV groove circumflex was diminutive and occluded proximally; RCA totally occluded in mid segment - stented with 3.5 x 9m Promus Element DES  - See Media tab   CARDIAC CATHETERIZATION  04/04/2013   40% mLAD, 20% mRCA at the site of the previous stent.  LVEF was 55%.   COLONOSCOPY WITH PROPOFOL N/A 02/26/2015   Procedure: COLONOSCOPY WITH PROPOFOL;  Surgeon: MJosefine Class MD;  [facility];  Service: Endoscopy;  Laterality: N/A;   COLONOSCOPY WITH PROPOFOL N/A 03/16/2020   Procedure: COLONOSCOPY WITH PROPOFOL;  Surgeon: LLesly Rubenstein MD;  Location: [facility];  Service: Endoscopy;  Laterality: N/A;   ESOPHAGOGASTRODUODENOSCOPY  02/26/2015   Procedure: ESOPHAGOGASTRODUODENOSCOPY (EGD);  Surgeon: MJosefine Class MD;  [facility];  Service: Endoscopy;;   ESOPHAGOGASTRODUODENOSCOPY (EGD) WITH PROPOFOL N/A 03/16/2020   Procedure: ESOPHAGOGASTRODUODENOSCOPY (EGD) WITH PROPOFOL;  Surgeon: LLesly Rubenstein MD;  Location: [facility];  Service: Endoscopy;  Laterality: N/A;   EYE SURGERY     cataract replaced   HERNIA REPAIR     LOOP RECORDER IMPLANT  08/16/2013   Dr. CSallyanne Kuster  RIGHT/LEFT HEART CATH AND CORONARY ANGIOGRAPHY Bilateral 06/07/2021   Procedure: RIGHT/LEFT HEART CATH AND CORONARY ANGIOGRAPHY;  Surgeon: AWellington Hampshire MD;  [facility];  Service: Cardiovascular;  Laterality: Bilateral;   XI ROBOTIC ASSISTED INGUINAL HERNIA REPAIR WITH MESH Left 05/23/2020   Procedure: XI ROBOTIC ASSISTED INGUINAL HERNIA REPAIR WITH MESH;  Surgeon: CHerbert Pun MD;  Location: [facility];  Service: General;  Laterality: Left;   Family History  Problem Relation Age of Onset   Cancer Mother        oral   Stroke Maternal Grandmother    Diabetes Brother    Coronary artery disease Brother    Social History   Tobacco Use   Smoking status: Former    Packs/day: 1.00    Years: 53.00    Total pack years: 53.00     Types: Cigarettes    Quit date: 11/14/2019    Years since quitting: 1.7   Smokeless tobacco: Never   Tobacco comments:    Down to 0.25 packs per day  Substance Use Topics   Alcohol use: No   Allergies  Allergen Reactions   Penicillins Anaphylaxis, Hives and Nausea And Vomiting    As a child Has patient had a PCN reaction causing immediate rash, facial/tongue/throat swelling, SOB or lightheadedness with hypotension: No Has patient had a PCN reaction causing severe rash involving mucus membranes or skin necrosis: No Has patient had a PCN reaction that required hospitalization No Has patient had a PCN reaction occurring within the last 10 years: No If all of the above answers are "NO", then may proceed with Cephalosporin use.     Review of Systems  Constitutional:  Negative for appetite change and fatigue.  HENT:  Negative for congestion, postnasal drip  and sore throat.   Eyes: Negative.   Respiratory:  Positive for shortness of breath and wheezing. Negative for cough and chest tightness.   Cardiovascular:  Positive for leg swelling. Negative for chest pain and palpitations.  Gastrointestinal:  Negative for abdominal distention and abdominal pain.  Endocrine: Negative.   Genitourinary: Negative.   Musculoskeletal:  Positive for arthralgias (knee).  Skin: Negative.   Allergic/Immunologic: Negative.   Neurological:  Negative for dizziness and light-headedness.  Hematological:  Negative for adenopathy. Does not bruise/bleed easily.  Psychiatric/Behavioral:  Negative for dysphoric mood and sleep disturbance (sleeping on 1 pillow with oxygen @ 2L). The patient is not nervous/anxious.       Physical Exam Vitals and nursing note reviewed.  Constitutional:      Appearance: Normal appearance.  HENT:     Head: Normocephalic and atraumatic.  Cardiovascular:     Rate and Rhythm: Normal rate and regular rhythm.  Pulmonary:     Effort: Pulmonary effort is normal. No respiratory  distress.     Breath sounds: Wheezing (few expiratory wheezes in lower lobes) present. No rales.  Abdominal:     General: There is no distension.     Palpations: Abdomen is soft.     Tenderness: There is no abdominal tenderness.  Musculoskeletal:        General: No tenderness.     Cervical back: Normal range of motion and neck supple.     Right lower leg: No edema.     Left lower leg: No edema.  Skin:    General: Skin is warm and dry.  Neurological:     General: No focal deficit present.     Mental Status: He is alert and oriented to person, place, and time.  Psychiatric:        Mood and Affect: Mood normal.        Behavior: Behavior normal.        Thought Content: Thought content normal.    Assessment and Plan:  Chronic Heart failure with reduced ejection fraction - NHYA II - euvolemic - weighing daily; knows to call for an overnight weight gain of > 2 pounds or a weekly weight gain of > 5 pounds - weight 190.2 pounds from last visit here 4 months ago - not adding salt to his food - discussed ~ 64 ounces/day total fluid - on GDMT of carvedilol, entresto, farxiga and spironolactone - finished with [facility] program - had BIV pacemaker inserted 07/30/21 - saw EP (Camnitz) 07/29/21 - BNP 04/19/21 60.4  2. HTN - BP - saw PCP Ouida Sills) 05/09/21 - BMP 07/29/21 Sodium 137, Potassium 4.5, Creatinine 0.96, GFR 85  3. CAD - plavix, pravastatin - saw cardiology (Croitoru) 06/25/21  4. OSA - discharged home with O2 2 lpm HS - sleep study   5. Tobacco abuse - smoking 1/4 PPD - wife is also helping him decrease his smoking - complete cessation discussed for 3 minutes with him   Patient did not bring his medications nor a list. Each medication was verbally reviewed with the patient andshe was encouraged to bring the bottles to every visit to confirm accuracy of list.

## 2021-09-02 ENCOUNTER — Telehealth: Payer: Self-pay | Admitting: Family

## 2021-09-02 ENCOUNTER — Ambulatory Visit: Payer: Medicare HMO | Admitting: Family

## 2021-09-02 NOTE — Telephone Encounter (Signed)
Patient did not show for his [facility] appointment on 09/02/21. Will attempt to reschedule.

## 2021-09-09 MED ORDER — DAPAGLIFLOZIN PROPANEDIOL 10 MG PO TABS: 10.0000 mg | ORAL_TABLET | Freq: Every day | ORAL | 2 refills | Status: DC

## 2021-09-09 MED ORDER — PRAVASTATIN SODIUM 80 MG PO TABS
80.0000 mg | ORAL_TABLET | Freq: Every evening | ORAL | 2 refills | Status: DC
Start: 2021-09-09 — End: 2022-05-12

## 2021-09-09 MED ORDER — SACUBITRIL-VALSARTAN 97-103 MG PO TABS: 1.0000 | ORAL_TABLET | Freq: Two times a day (BID) | ORAL | 2 refills | Status: DC

## 2021-09-09 NOTE — Addendum Note (Signed)
Addended by: Caprice Beaver T on: 09/09/2021 01:19 PM   Modules accepted: Orders

## 2021-09-09 NOTE — Addendum Note (Signed)
Addended by: Caprice Beaver T on: 09/09/2021 12:54 PM   Modules accepted: Orders

## 2021-09-18 ENCOUNTER — Other Ambulatory Visit: Payer: Self-pay

## 2021-09-18 NOTE — Patient Outreach (Signed)
[facility]) Care Management  09/18/2021  Louis Gutierrez 04-16-1950 [id number]    Case Closure   Case is being transferred to [place] services. Assigned RN CM will outreach and follow up with patient.    Enzo Montgomery, RN,BSN,CCM [facility] [facility] Telephonic Care Management Coordinator Direct Phone: [phone] Toll Free: [phone] Fax: [phone]

## 2021-10-02 ENCOUNTER — Encounter: Payer: Self-pay | Admitting: Cardiovascular Disease

## 2021-10-02 ENCOUNTER — Ambulatory Visit: Payer: Medicare HMO | Attending: Cardiovascular Disease | Admitting: Cardiovascular Disease

## 2021-10-02 VITALS — BP 130/74 | HR 73 | Ht 71.0 in | Wt 183.6 lb

## 2021-10-02 DIAGNOSIS — R7303 Prediabetes: Secondary | ICD-10-CM

## 2021-10-02 DIAGNOSIS — J449 Chronic obstructive pulmonary disease, unspecified: Secondary | ICD-10-CM

## 2021-10-02 DIAGNOSIS — Z8673 Personal history of transient ischemic attack (TIA), and cerebral infarction without residual deficits: Secondary | ICD-10-CM

## 2021-10-02 DIAGNOSIS — I10 Essential (primary) hypertension: Secondary | ICD-10-CM | POA: Diagnosis not present

## 2021-10-02 DIAGNOSIS — I2510 Atherosclerotic heart disease of native coronary artery without angina pectoris: Secondary | ICD-10-CM | POA: Diagnosis not present

## 2021-10-02 DIAGNOSIS — E785 Hyperlipidemia, unspecified: Secondary | ICD-10-CM | POA: Diagnosis not present

## 2021-10-02 DIAGNOSIS — I5022 Chronic systolic (congestive) heart failure: Secondary | ICD-10-CM | POA: Diagnosis not present

## 2021-10-02 DIAGNOSIS — Z950 Presence of cardiac pacemaker: Secondary | ICD-10-CM

## 2021-10-02 NOTE — Progress Notes (Signed)
Cardiology Office Note    Date:  10/02/2021   ID:  Louis Gutierrez, DOB 1951-01-20, MRN [id number]  PCP:  Kirk Ruths, MD  Cardiologist:   Sanda Klein, MD   No chief complaint on file.   History of Present Illness:  Louis Gutierrez is a 71 y.o. male with coronary artery disease, congestive heart failure, LBBB s/p CRT-P (Abbott Quadra Allure MP, 07/30/21, [place]), COPD and a history of cryptogenic stroke.  Patient presented with inferior wall STEMI in 2012 when he underwent placement of a drug-eluting stent to the RCA for total occlusion (2.5 x 24 mm Promus), and was also found to have moderate stenosis in the LAD, first diagonal artery and subclavian artery as well as chronic total occlusion of the small nondominant left circumflex coronary artery.  His EF has been mildly decreased at about 50% ever since.  He had an unexplained stroke with right hemiparesis and expressive aphasia in 2015.  Loop recorder did not show evidence of atrial fibrillation throughout its entire 3-year operation.  He was admitted to [facility] in early March 2023 with severe shortness of breath and was treated for both heart failure and COPD exacerbation (steroids, bronchodilators, furosemide).  Echocardiogram performed that time showed LVEF 35-40% with grade 1 diastolic dysfunction.  BNP 197  February 28, decreased to 60 on March 31.  Cardiac catheterization 07/25/2021 showed normal right and left heart filling pressures.  LVEF was estimated to be severely reduced with an ejection fraction of 30-35%.  The previously placed stent in the right coronary artery was widely patent and he did not have any other meaningful stenoses.  Most severe lesion was a 40% proximal-mid left circumflex stenosis.  He has not had any angina at rest or with activity since his last appointment.  Continues to have NYHA functional class II exertional dyspnea, but feels that he has improved.  Has not had any episodes  of dizziness or syncope.  Has not had any palpitations.  Denies orthopnea, PND or lower extremity edema.  He is in good spirits.  Has not had any bleeding problems.  After undergoing implantation of a CRT-P device he feels better. (Abbott Quadra Allure MP, 07/30/21, [place]) The site has healed very nicely.  It does not bother him at all.  Quick look at device settings today showed normal function.  Lead outputs are still at initial high level.  Lead impedances are normal.  Capture thresholds were not tested today.  Atrial sensing is excellent with P waves greater than 5 mV.  There have been 24 episodes of mode switch which are all brief lasting 8-12 seconds.  Review of the electrograms show that they all represent far field R wave oversensing.  Increased atrial sensitivity setting to 1.0 mV.  There has been no true atrial fibrillation.  The paced QRS complex is quite narrow at 114 ms with a distinct initial R wave in leads V1-V2.  QTc 449 ms.  He is on maximum dose Entresto, highest tolerated dose of carvedilol, spironolactone and Farxiga.  He continues to take dual antiplatelet therapy without bleeding problems.  Metabolic parameters are good with a hemoglobin A1c of 6.3% and LDL of 61, creatinine 0.96.  He had a screening CT lung cancer which showed coronary artery calcifications (not surprising) and gallstones (asymptomatic).  Since this cryptogenic stroke, he has an implantable loop recorder that has reached end of service and is left in place.  During 3 years of monitoring that did  not show atrial fibrillation.  He has not had any new neurological events.  His loop recorder has reached end of service.  He presented with ST segment elevation myocardial infarction in May of 2012. At that time had an inferior wall infarction, manifesting as unstable angina pectoris and eventually a syncopal episode. The right coronary artery was totally occluded and revascularization was difficult but eventually he  received a 3.5 x 24 mm Promus drug-eluting stent to the mid RCA. Also noted was 50% ostial stenosis of the first diagonal artery and 40% ostial stenosis of the second diagonal artery branches of the LAD, as well as total occlusion of a small left circumflex system. There was evidence of inferolateral hypokinesis and ejection fraction of 45-50% at that time. He presented with chest pain to [facility] in 2015 when he had a repeat coronary angiogram that was reportedly normal. I don't have that report. In May 2015 he presented with right hemiparesis and expressive aphasia with an infarction in the territory of the left middle cerebral artery. An etiology was not identified. He underwent loop recorder implantation in August 2015 and in 3 years the device has not recorded any arrhythmia. His most recent echo performed in March 2015 showed left ventricular ejection fraction of 50-55 percent with abnormal relaxation and no regional wall motion abnormalities. Both atria were described as normal in size and there were no serious valvular abnormalities.  Identical findings recorded in September 2018 echo, again EF 50-55% and the left atrium was described as normal in size.  During the hospitalization for acute shortness of breath at [facility] in March 2023 LVEF was estimated at 35-40%.  [name] and Ross were initiated.  Cardiac catheterization in July 2023 did not show any new coronary abnormalities.  He underwent implantation of a dual-chamber CRT-P device 07/30/2021 (Abbott Quadra Allure MP, 07/30/21, [place]).      Past Medical History:  Diagnosis Date   Adrenal adenoma, left    Anxiety    Aortic atherosclerosis (HCC)    Asthma without status asthmaticus    Atherosclerotic cerebrovascular disease    Overview:  Small and facial weakness with minimal weakness    CAD (coronary artery disease)    a. 05/2010 Inf STEMI/PCI: RCA 136m(3.5x24 Promus DES), LCX small, 100, D1 50ost, D2 40ost, EF 45-50%;  b. 03/2013 Cath ([facility]): 40% mLAD, 20% stenosis mRCA (site of the previous stent); LVEF 55%.   Cervical disc disease    a. s/p C3-4 fusion 2003.   Cervical myelopathy ([facility]    a. 09/2016 Admit w/ left sided wkns->found to have progressive cervical disc dzs above and below prior fusioin site; b. 10/2016 s/p ant cervical diskectomy.   COPD (chronic obstructive pulmonary disease) ([facility]    Cryptogenic stroke [place] 05/2013    s/p acute L MCA territory infarct; b. 07/2013 s/p MDT Linq ILR-->No AFib to documented to date (09/2016).   Current use of long term anticoagulation    DAPT therapy (ASA + clopidogrel)   Depression    Diabetes mellitus without complication (HCC)    Essential (primary) hypertension    Gastritis    GERD (gastroesophageal reflux disease)    H/O insomnia    History of cerebrovascular accident (CVA) with residual deficit 2015   facial drooping on one side of face is only residual since stroke   IDA (iron deficiency anemia)    Inguinal hernia    Insomnia disorder    Ischemic cardiomyopathy    a. 05/2010 LV gram:  EF 45-50% @ time of inf MI; b. 09/2016 Echo: EF 50-55%, inf HK, Gr1 DD, mild MR.   Mixed hyperlipidemia    Nephrolithiasis    Obesity    OSA (obstructive sleep apnea)    no cpap   Osteoarthritis    STEMI (ST elevation myocardial infarction) ([place]) 06/07/2010   1 stent   Tobacco abuse    Urinary incontinence    Varicose veins    Vertigo     Past Surgical History:  Procedure Laterality Date   ANTERIOR CERVICAL DECOMP/DISCECTOMY FUSION N/A 10/22/2016   Procedure: ANTERIOR CERVICAL DECOMPRESSION/DISCECTOMY FUSION 2 LEVELS C3-4, C6-7;  Surgeon: Meade Maw, MD;  Location: [facility];  Service: Neurosurgery;  Laterality: N/A;   BACK SURGERY     BIV PACEMAKER INSERTION CRT-P N/A 07/30/2021   Procedure: BIV PACEMAKER INSERTION CRT-P;  Surgeon: Constance Haw, MD;  Location: [facility];  Service: Cardiovascular;  Laterality: N/A;   CARDIAC  CATHETERIZATION  06/07/2010   50 % ostial narrowing in 1st diagonal from LAD, 2nd diagonal had 40% ostial narrowing; AV groove circumflex was diminutive and occluded proximally; RCA totally occluded in mid segment - stented with 3.5 x 26m Promus Element DES  - See Media tab   CARDIAC CATHETERIZATION  04/04/2013   40% mLAD, 20% mRCA at the site of the previous stent.  LVEF was 55%.   COLONOSCOPY WITH PROPOFOL N/A 02/26/2015   Procedure: COLONOSCOPY WITH PROPOFOL;  Surgeon: MJosefine Class MD;  [facility];  Service: Endoscopy;  Laterality: N/A;   COLONOSCOPY WITH PROPOFOL N/A 03/16/2020   Procedure: COLONOSCOPY WITH PROPOFOL;  Surgeon: LLesly Rubenstein MD;  Location: [facility];  Service: Endoscopy;  Laterality: N/A;   ESOPHAGOGASTRODUODENOSCOPY  02/26/2015   Procedure: ESOPHAGOGASTRODUODENOSCOPY (EGD);  Surgeon: MJosefine Class MD;  [facility];  Service: Endoscopy;;   ESOPHAGOGASTRODUODENOSCOPY (EGD) WITH PROPOFOL N/A 03/16/2020   Procedure: ESOPHAGOGASTRODUODENOSCOPY (EGD) WITH PROPOFOL;  Surgeon: LLesly Rubenstein MD;  Location: [facility];  Service: Endoscopy;  Laterality: N/A;   EYE SURGERY     cataract replaced   HERNIA REPAIR     LOOP RECORDER IMPLANT  08/16/2013   Dr. CSallyanne Kuster  RIGHT/LEFT HEART CATH AND CORONARY ANGIOGRAPHY Bilateral 06/07/2021   Procedure: RIGHT/LEFT HEART CATH AND CORONARY ANGIOGRAPHY;  Surgeon: AWellington Hampshire MD;  [facility];  Service: Cardiovascular;  Laterality: Bilateral;   XI ROBOTIC ASSISTED INGUINAL HERNIA REPAIR WITH MESH Left 05/23/2020   Procedure: XI ROBOTIC ASSISTED INGUINAL HERNIA REPAIR WITH MESH;  Surgeon: CHerbert Pun MD;  Location: [facility];  Service: General;  Laterality: Left;    Current Medications: Outpatient Medications Prior to Visit  Medication Sig Dispense Refill   ACCU-CHEK GUIDE test strip USE 1 STRIP TO TEST ONCE DAILY     acetaminophen (TYLENOL) 325  MG tablet Take 650 mg by mouth every 6 (six) hours as needed for moderate pain.     aspirin 81 MG chewable tablet Chew 1 tablet (81 mg total) by mouth daily.     carvedilol (COREG) 6.25 MG tablet Take 1 tablet (6.25 mg total) by mouth 2 (two) times daily. 60 tablet 3   clopidogrel (PLAVIX) 75 MG tablet TAKE 1 TABLET(75 MG) BY MOUTH DAILY 90 tablet 3   COMBIVENT RESPIMAT 20-100 MCG/ACT AERS respimat Inhale 1 puff into the lungs every 6 (six) hours as needed for wheezing.     dapagliflozin propanediol (FARXIGA) 10 MG TABS tablet Take 1 tablet (10 mg total)  by mouth daily before breakfast. 30 tablet 2   ferrous sulfate 325 (65 FE) MG EC tablet Take 1 tablet (325 mg total) by mouth in the morning and at bedtime. 60 tablet 11   furosemide (LASIX) 20 MG tablet Take 1 tablet (20 mg total) by mouth daily. 90 tablet 3   pantoprazole (PROTONIX) 40 MG tablet Take 40 mg by mouth daily.     PARoxetine (PAXIL) 40 MG tablet Take 40 mg by mouth daily.      pravastatin (PRAVACHOL) 80 MG tablet Take 1 tablet (80 mg total) by mouth every evening. 30 tablet 2   sacubitril-valsartan (ENTRESTO) 97-103 MG Take 1 tablet by mouth 2 (two) times daily. 60 tablet 2   spironolactone (ALDACTONE) 25 MG tablet Take 0.5 tablets (12.5 mg total) by mouth daily. 45 tablet 3   traZODone (DESYREL) 100 MG tablet Take 50 mg by mouth at bedtime as needed for sleep.      NICOTROL NS 10 MG/ML SOLN Place 0.1 mLs (1 mg total) into both nostrils as needed. (Patient not taking: Reported on 10/02/2021)  0   No facility-administered medications prior to visit.     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  Tobacco Use   Smoking  status: Former    Packs/day: 1.00    Years: 53.00    Total pack years: 53.00    Types: Cigarettes    Quit date: 11/14/2019    Years since quitting: 1.8   Smokeless tobacco: Never   Tobacco comments:    Down to 0.25 packs per day  Vaping Use   Vaping Use: Some days   Start date: 11/14/2019   Substances: Nicotine  Substance and Sexual Activity   Alcohol use: No   Drug use: No   Sexual activity: Not on file  Other Topics Concern   Not on file  Social History Narrative   Lives in [place] with wife and two daughters. Feels safe in his home.   Walks dog 30 mins 2x/day w/o difficulties.      Continues to work part time.   Social Determinants of Health   Financial Resource Strain: Not on file  Food Insecurity: No Food Insecurity (08/23/2020)   Hunger Vital Sign    Worried About Running Out of Food in the Last Year: Never true    Ran Out of Food in the Last Year: Never true  Transportation Needs: No Transportation Needs (08/23/2020)   PRAPARE - Hydrologist (Medical): No    Lack of Transportation (Non-Medical): No  Physical Activity: Not on file  Stress: Not on file  Social Connections: Not on file     Family History:  The patient's family history includes Cancer in his mother; Coronary artery disease in his brother; Diabetes in his brother; Stroke in his maternal grandmother.   ROS:   Please see the history of present illness.    ROS All other systems are reviewed and are negative.   PHYSICAL EXAM:   VS:  BP 130/74 (BP Location: Left Arm, Patient Position: Sitting, Cuff Size: Normal)   Pulse 73  Ht '5\' 11"'$  (1.803 m)   Wt 183 lb 9.6 oz (83.3 kg)   SpO2 93%   BMI 25.61 kg/m      General: Alert, oriented x3, no distress, lean.  Well-healed left subclavian pacemaker site. Head: no evidence of trauma, PERRL, EOMI, no exophtalmos or lid lag, no myxedema, no xanthelasma; normal ears, nose and oropharynx Neck: normal jugular venous pulsations  and no hepatojugular reflux; brisk carotid pulses without delay and no carotid bruits Chest: clear to auscultation, no signs of consolidation by percussion or palpation, normal fremitus, symmetrical and full respiratory excursions Cardiovascular: normal position and quality of the apical impulse, regular rhythm, normal first and second heart sounds, no murmurs, rubs or gallops Abdomen: no tenderness or distention, no masses by palpation, no abnormal pulsatility or arterial bruits, normal bowel sounds, no hepatosplenomegaly Extremities: no clubbing, cyanosis or edema; 2+ radial, ulnar and brachial pulses bilaterally; 2+ right femoral, posterior tibial and dorsalis pedis pulses; 2+ left femoral, posterior tibial and dorsalis pedis pulses; no subclavian or femoral bruits Neurological: grossly nonfocal Psych: Normal mood and affect    Wt Readings from Last 3 Encounters:  10/02/21 183 lb 9.6 oz (83.3 kg)  08/29/21 177 lb (80.3 kg)  07/30/21 188 lb (85.3 kg)      Studies/Labs Reviewed:   06/07/2021 RIGHT/LEFT HEART CATH AND CORONARY ANGIOGRAPHY    Prox Cx to Mid Cx lesion is 40% stenosed.   Prox RCA to Mid RCA lesion is 10% stenosed.   There is moderate left ventricular systolic dysfunction.   LV end diastolic pressure is normal.   1.  Widely patent RCA stent with minimal restenosis.  No evidence of obstructive coronary artery disease. 2.  Moderately to severely reduced LV systolic function with an EF of 30 to 35%. 3.  Right heart catheterization showed normal right and left-sided filling pressures, minimal pulmonary hypertension and normal cardiac output.   Recommendations: The patient's cardiomyopathy seems to be out of proportion to his coronary artery disease and his previous inferior myocardial infarction.  There is likely a nonischemic component.  His hemodynamics look excellent.  Continue same medications. Consider CRT given underlying left bundle branch block and worsening ejection  fraction.   Fick Cardiac Output 4.62 L/min  Fick Cardiac Output Index 2.29 (L/min)/BSA  RA A Wave 9 mmHg  RA V Wave 8 mmHg  RA Mean 6 mmHg  RV Systolic Pressure 34 mmHg  RV Diastolic Pressure 3 mmHg  RV EDP 10 mmHg  PA Systolic Pressure 32 mmHg  PA Diastolic Pressure 16 mmHg  PA Mean 22 mmHg  PW A Wave 11 mmHg  PW V Wave 12 mmHg  PW Mean 10 mmHg  LV Systolic Pressure 756 mmHg  LV Diastolic Pressure -1 mmHg  LV EDP 7 mmHg  AOp Systolic Pressure 93 mmHg  AOp Diastolic Pressure 55 mmHg  AOp Mean Pressure 70 mmHg  LVp Systolic Pressure 94 mmHg  LVp Diastolic Pressure 0 mmHg  LVp EDP Pressure 9 mmHg  QP/QS 1  TPVR Index 9.59 HRUI    Echocardiogram 03/20/2021:    1. Left ventricular ejection fraction, by estimation, is 35 to 40%. The  left ventricle has moderately decreased function. The left ventricle  demonstrates regional wall motion abnormalities (see scoring  diagram/findings for description). Left ventricular   diastolic parameters are consistent with Grade I diastolic dysfunction  (impaired relaxation). There is LV global hypokinesis with inferior and  inferolateral wall akinesis.   2. Right ventricular systolic function is normal. The  right ventricular  size is normal.   3. The mitral valve is normal in structure. Mild mitral valve  regurgitation.   4. The aortic valve is tricuspid. Aortic valve regurgitation is not  visualized. Aortic valve sclerosis is present, with no evidence of aortic  valve stenosis.   5. Aortic dilatation noted. There is mild dilatation of the aortic root,  measuring 40 mm.   6. The inferior vena cava is normal in size with <50% respiratory  variability, suggesting right atrial pressure of 8 mmHg.   EKG:  EKG is ordered today and atrial sensed (sinus), biventricular paced rhythm with distinct initial small R wave in V1 and narrow QRS at 114 ms with a right bundle branch block/left axis deviation morphology.  QTc 449 ms.  Recent Labs:      Latest Ref Rng & Units 07/29/2021   10:36 AM 05/30/2021   12:06 PM 05/03/2021    1:09 PM  BMP  Glucose 70 - 99 mg/dL 106  116  126   BUN 8 - 27 mg/dL '17  21  15   '$ Creatinine 0.76 - 1.27 mg/dL 0.96  1.04  0.99   BUN/Creat Ratio 10 - '24 18  20  15   '$ Sodium 134 - 144 mmol/L 137  140  144   Potassium 3.5 - 5.2 mmol/L 4.5  4.9  4.9   Chloride 96 - 106 mmol/L 103  98  102   CO2 20 - 29 mmol/L '30  26  26   '$ Calcium 8.6 - 10.2 mg/dL 9.7  10.1  9.8       Lipid Panel    Component Value Date/Time   CHOL 115 12/22/2019 0908   CHOL 110 05/22/2013 0526   TRIG 66 12/22/2019 0908   TRIG 162 05/22/2013 0526   HDL 46 12/22/2019 0908   HDL 17 (L) 05/22/2013 0526   CHOLHDL 2.5 12/22/2019 0908   CHOLHDL 3.1 10/08/2016 0507   VLDL 17 10/08/2016 0507   VLDL 32 05/22/2013 0526   LDLCALC 55 12/22/2019 0908   LDLCALC 61 05/22/2013 0526   LDLDIRECT 67 04/19/2021 1126   04/25/2021 Hemoglobin A1c 6.3%  ASSESSMENT:    1. Chronic systolic congestive heart failure ([place])   2. Coronary artery disease involving native coronary artery of native heart without angina pectoris   3. Status post biventricular pacemaker   4. History of arterial ischemic stroke   5. Hyperlipidemia LDL goal <70   6. Prediabetes   7. Essential hypertension   8. Chronic obstructive pulmonary disease, unspecified COPD type ([place])      PLAN:  In order of problems listed above:  CHF: Functionally better after implantation of biventricular device.  Recheck echo after at least 3 months following implantation.  QRS complex duration predicts good response.  He is on guideline directed medical therapy with Entresto/carvedilol/spironolactone/dapagliflozin.  I think he will benefit greatly from cardiac resynchronization therapy.  Appears clinically euvolemic on the current diuretic dose.   CAD: No evidence of new coronary lesions on angiography in early July 2023.  Denies angina pectoris.Marland Kitchen CRT-P: Normal device function.  He has an  appoint with Dr. Curt Bears next month.  Made a small adjustment to the settings today by changing atrial sensitivity to 1.0 mV due to multiple episodes of far field R wave oversensing.   Hx cryptogenic ischemic stroke: This has not recurred in about 8 years.  He did not have atrial fibrillation during 3 years of implantable loop recorder monitoring and no atrial  fibrillation has been detected since his biventricular device was implanted. HLP/prediabetes: All metabolic parameters in acceptable range. HTN: Blood pressure has improved since his last appointment. COPD/history smoking: No longer smoking, but exposed to secondhand smoke from his stepson.  He had normal filling pressures at the time of his cardiac catheterization so any residual dyspnea at that time is likely due to COPD. Depression: Fairly improved.    Medication Adjustments/Labs and Tests Ordered: Current medicines are reviewed at length with the patient today.  Concerns regarding medicines are outlined above.  Medication changes, Labs and Tests ordered today are listed in the Patient Instructions below. Patient Instructions  Medication Instructions:  No changes *If you need a refill on your cardiac medications before your next appointment, please call your pharmacy*   Lab Work: None ordered If you have labs (blood work) drawn today and your tests are completely normal, you will receive your results only by: [place] (if you have MyChart) OR A paper copy in the mail If you have any lab test that is abnormal or we need to change your treatment, we will call you to review the results.   Testing/Procedures: Your physician has requested that you have an echocardiogram in November. Echocardiography is a painless test that uses sound waves to create images of your heart. It provides your doctor with information about the size and shape of your heart and how well your heart's chambers and valves are working. You may receive an  ultrasound enhancing agent through an IV if needed to better visualize your heart during the echo.This procedure takes approximately one hour. There are no restrictions for this procedure. This will take place at the [address], [address].     Follow-Up: At [facility], you and your health needs are our priority.  As part of our continuing mission to provide you with exceptional heart care, we have created designated Provider Care Teams.  These Care Teams include your primary Cardiologist (physician) and Advanced Practice Providers (APPs -  Physician Assistants and Nurse Practitioners) who all work together to provide you with the care you need, when you need it.  We recommend signing up for the patient portal called "MyChart".  Sign up information is provided on this After Visit Summary.  MyChart is used to connect with patients for Virtual Visits (Telemedicine).  Patients are able to view lab/test results, encounter notes, upcoming appointments, etc.  Non-urgent messages can be sent to your provider as well.   To learn more about what you can do with MyChart, go to [email or web address].    Your next appointment:   12 month(s)  The format for your next appointment:   In Person  Provider:   Sanda Klein, MD      Important Information About Sugar         Signed, Sanda Klein, MD  10/02/2021 9:09 AM    [facility] [place], [place], [place]  [address] Phone: [phone]; Fax: [phone]   Shared Decision Making/Informed Consent The risks [stroke (1 in 1000), death (1 in 1000), kidney failure [usually temporary] (1 in 500), bleeding (1 in 200), allergic reaction [possibly serious] (1 in 200)], benefits (diagnostic support and management of coronary artery disease) and alternatives of a cardiac catheterization were discussed in detail with Louis Gutierrez and he is willing to proceed.

## 2021-10-02 NOTE — Patient Instructions (Signed)
Medication Instructions:  No changes *If you need a refill on your cardiac medications before your next appointment, please call your pharmacy*   Lab Work: None ordered If you have labs (blood work) drawn today and your tests are completely normal, you will receive your results only by: [place] (if you have MyChart) OR A paper copy in the mail If you have any lab test that is abnormal or we need to change your treatment, we will call you to review the results.   Testing/Procedures: Your physician has requested that you have an echocardiogram in November. Echocardiography is a painless test that uses sound waves to create images of your heart. It provides your doctor with information about the size and shape of your heart and how well your heart's chambers and valves are working. You may receive an ultrasound enhancing agent through an IV if needed to better visualize your heart during the echo.This procedure takes approximately one hour. There are no restrictions for this procedure. This will take place at the [address], [address].     Follow-Up: At [facility], you and your health needs are our priority.  As part of our continuing mission to provide you with exceptional heart care, we have created designated Provider Care Teams.  These Care Teams include your primary Cardiologist (physician) and Advanced Practice Providers (APPs -  Physician Assistants and Nurse Practitioners) who all work together to provide you with the care you need, when you need it.  We recommend signing up for the patient portal called "MyChart".  Sign up information is provided on this After Visit Summary.  MyChart is used to connect with patients for Virtual Visits (Telemedicine).  Patients are able to view lab/test results, encounter notes, upcoming appointments, etc.  Non-urgent messages can be sent to your provider as well.   To learn more about what you can do with MyChart, go to  [email or web address].    Your next appointment:   12 month(s)  The format for your next appointment:   In Person  Provider:   Sanda Klein, MD      Important Information About Sugar

## 2021-11-01 ENCOUNTER — Ambulatory Visit (INDEPENDENT_AMBULATORY_CARE_PROVIDER_SITE_OTHER): Payer: Medicare HMO

## 2021-11-01 DIAGNOSIS — I5042 Chronic combined systolic (congestive) and diastolic (congestive) heart failure: Secondary | ICD-10-CM

## 2021-11-01 LAB — CUP PACEART REMOTE DEVICE CHECK
Battery Remaining Longevity: 55 mo
Battery Remaining Percentage: 95.5 %
Battery Voltage: 2.98 V
Brady Statistic AP VP Percent: 6.8 %
Brady Statistic AP VS Percent: 1 %
Brady Statistic AS VP Percent: 93 %
Brady Statistic AS VS Percent: 1 %
Brady Statistic RA Percent Paced: 6.6 %
Brady Statistic RV Percent Paced: 99 %
Date Time Interrogation Session: 20231013021732
Implantable Lead Implant Date: 20230711
Implantable Lead Implant Date: 20230711
Implantable Lead Implant Date: 20230711
Implantable Lead Location: 753858
Implantable Lead Location: 753859
Implantable Lead Location: 753860
Implantable Pulse Generator Implant Date: 20230711
Lead Channel Impedance Value: 1075 Ohm
Lead Channel Impedance Value: 450 Ohm
Lead Channel Impedance Value: 580 Ohm
Lead Channel Pacing Threshold Amplitude: 0.5 V
Lead Channel Pacing Threshold Amplitude: 0.5 V
Lead Channel Pacing Threshold Amplitude: 2 V
Lead Channel Pacing Threshold Pulse Width: 0.5 ms
Lead Channel Pacing Threshold Pulse Width: 0.5 ms
Lead Channel Pacing Threshold Pulse Width: 0.5 ms
Lead Channel Sensing Intrinsic Amplitude: 5 mV
Lead Channel Sensing Intrinsic Amplitude: 9.3 mV
Lead Channel Setting Pacing Amplitude: 3.5 V
Lead Channel Setting Pacing Amplitude: 3.5 V
Lead Channel Setting Pacing Amplitude: 3.5 V
Lead Channel Setting Pacing Pulse Width: 0.5 ms
Lead Channel Setting Pacing Pulse Width: 0.5 ms
Lead Channel Setting Sensing Sensitivity: 2 mV
Pulse Gen Model: 3562
Pulse Gen Serial Number: 8088605

## 2021-11-05 ENCOUNTER — Encounter: Payer: Self-pay | Admitting: Cardiology

## 2021-11-05 ENCOUNTER — Ambulatory Visit: Payer: Medicare HMO | Attending: Cardiology | Admitting: Cardiology

## 2021-11-05 VITALS — BP 118/70 | HR 96 | Ht 71.0 in | Wt 184.2 lb

## 2021-11-05 DIAGNOSIS — I10 Essential (primary) hypertension: Secondary | ICD-10-CM

## 2021-11-05 DIAGNOSIS — I2510 Atherosclerotic heart disease of native coronary artery without angina pectoris: Secondary | ICD-10-CM

## 2021-11-05 DIAGNOSIS — I5022 Chronic systolic (congestive) heart failure: Secondary | ICD-10-CM

## 2021-11-05 NOTE — Progress Notes (Signed)
Electrophysiology Office Note   Date:  11/05/2021   ID:  Louis Gutierrez, DOB 08/23/1950, MRN [id number]  PCP:  Kirk Ruths, MD  Cardiologist:  [place] Primary Electrophysiologist:  Felicha Frayne Meredith Leeds, MD    Chief Complaint: CHF   History of Present Illness: Louis Gutierrez is a 71 y.o. male who is being seen today for the evaluation of CHF at the request of Kirk Ruths, MD. Presenting today for electrophysiology evaluation.  He has a history significant for coronary artery disease, chronic systolic heart failure, COPD, cryptogenic stroke.  He presented to the hospital with STEMI in 2012 and underwent DES to the RCA.  He was readmitted to [facility] with severe shortness of breath with an echo showing ejection fraction of 35 to 40%.  He is now status post Abbott CRT-P implanted 07/30/2021.  Today, denies symptoms of palpitations, chest pain, shortness of breath, orthopnea, PND, lower extremity edema, claudication, dizziness, presyncope, syncope, bleeding, or neurologic sequela. The patient is tolerating medications without difficulties.  His CRT was implanted he has felt better.  He has much more energy.  He also attributes this to his BiPAP and sleeping better.  He is able to do more of his daily activities since the device was implanted.  Past Medical History:  Diagnosis Date   Adrenal adenoma, left    Anxiety    Aortic atherosclerosis (HCC)    Asthma without status asthmaticus    Atherosclerotic cerebrovascular disease    Overview:  Small and facial weakness with minimal weakness    CAD (coronary artery disease)    a. 05/2010 Inf STEMI/PCI: RCA 139m(3.5x24 Promus DES), LCX small, 100, D1 50ost, D2 40ost, EF 45-50%; b. 03/2013 Cath ([facility]): 40% mLAD, 20% stenosis mRCA (site of the previous stent); LVEF 55%.   Cervical disc disease    a. s/p C3-4 fusion 2003.   Cervical myelopathy ([place]    a. 09/2016 Admit w/ left sided wkns->found to have progressive  cervical disc dzs above and below prior fusioin site; b. 10/2016 s/p ant cervical diskectomy.   COPD (chronic obstructive pulmonary disease) ([place]    Cryptogenic stroke ([place] 05/2013    s/p acute L MCA territory infarct; b. 07/2013 s/p MDT Linq ILR-->No AFib to documented to date (09/2016).   Current use of long term anticoagulation    DAPT therapy (ASA + clopidogrel)   Depression    Diabetes mellitus without complication (HCC)    Essential (primary) hypertension    Gastritis    GERD (gastroesophageal reflux disease)    H/O insomnia    History of cerebrovascular accident (CVA) with residual deficit 2015   facial drooping on one side of face is only residual since stroke   IDA (iron deficiency anemia)    Inguinal hernia    Insomnia disorder    Ischemic cardiomyopathy    a. 05/2010 LV gram: EF 45-50% @ time of inf MI; b. 09/2016 Echo: EF 50-55%, inf HK, Gr1 DD, mild MR.   Mixed hyperlipidemia    Nephrolithiasis    Obesity    OSA (obstructive sleep apnea)    no cpap   Osteoarthritis    STEMI [place]ST elevation myocardial infarction) [place] 06/07/2010   1 stent   Tobacco abuse    Urinary incontinence    Varicose veins    Vertigo    Past Surgical History:  Procedure Laterality Date   ANTERIOR CERVICAL DECOMP/DISCECTOMY FUSION N/A 10/22/2016   Procedure: ANTERIOR CERVICAL DECOMPRESSION/DISCECTOMY FUSION 2 LEVELS  C3-4, C6-7;  Surgeon: Meade Maw, MD;  Location: [facility];  Service: Neurosurgery;  Laterality: N/A;   BACK SURGERY     BIV PACEMAKER INSERTION CRT-P N/A 07/30/2021   Procedure: BIV PACEMAKER INSERTION CRT-P;  Surgeon: Constance Haw, MD;  Location: [facility];  Service: Cardiovascular;  Laterality: N/A;   CARDIAC CATHETERIZATION  06/07/2010   50 % ostial narrowing in 1st diagonal from LAD, 2nd diagonal had 40% ostial narrowing; AV groove circumflex was diminutive and occluded proximally; RCA totally occluded in mid segment - stented with 3.5 x 71m Promus Element  DES  - See Media tab   CARDIAC CATHETERIZATION  04/04/2013   40% mLAD, 20% mRCA at the site of the previous stent.  LVEF was 55%.   COLONOSCOPY WITH PROPOFOL N/A 02/26/2015   Procedure: COLONOSCOPY WITH PROPOFOL;  Surgeon: MJosefine Class MD;  [facility];  Service: Endoscopy;  Laterality: N/A;   COLONOSCOPY WITH PROPOFOL N/A 03/16/2020   Procedure: COLONOSCOPY WITH PROPOFOL;  Surgeon: LLesly Rubenstein MD;  Location: [facility] ENDOSCOPY;  Service: Endoscopy;  Laterality: N/A;   ESOPHAGOGASTRODUODENOSCOPY  02/26/2015   Procedure: ESOPHAGOGASTRODUODENOSCOPY (EGD);  Surgeon: MJosefine Class MD;  [facility];  Service: Endoscopy;;   ESOPHAGOGASTRODUODENOSCOPY (EGD) WITH PROPOFOL N/A 03/16/2020   Procedure: ESOPHAGOGASTRODUODENOSCOPY (EGD) WITH PROPOFOL;  Surgeon: LLesly Rubenstein MD;  Location: [facility];  Service: Endoscopy;  Laterality: N/A;   EYE SURGERY     cataract replaced   HERNIA REPAIR     LOOP RECORDER IMPLANT  08/16/2013   Dr. CSallyanne Kuster  RIGHT/LEFT HEART CATH AND CORONARY ANGIOGRAPHY Bilateral 06/07/2021   Procedure: RIGHT/LEFT HEART CATH AND CORONARY ANGIOGRAPHY;  Surgeon: AWellington Hampshire MD;  [facility];  Service: Cardiovascular;  Laterality: Bilateral;   XI ROBOTIC ASSISTED INGUINAL HERNIA REPAIR WITH MESH Left 05/23/2020   Procedure: XI ROBOTIC ASSISTED INGUINAL HERNIA REPAIR WITH MESH;  Surgeon: CHerbert Pun MD;  Location: [facility];  Service: General;  Laterality: Left;     Current Outpatient Medications  Medication Sig Dispense Refill   ACCU-CHEK GUIDE test strip USE 1 STRIP TO TEST ONCE DAILY     acetaminophen (TYLENOL) 325 MG tablet Take 650 mg by mouth every 6 (six) hours as needed for moderate pain.     aspirin 81 MG chewable tablet Chew 1 tablet (81 mg total) by mouth daily.     clopidogrel (PLAVIX) 75 MG tablet TAKE 1 TABLET(75 MG) BY MOUTH DAILY 90 tablet 3   COMBIVENT RESPIMAT 20-100 MCG/ACT  AERS respimat Inhale 1 puff into the lungs every 6 (six) hours as needed for wheezing.     dapagliflozin propanediol (FARXIGA) 10 MG TABS tablet Take 1 tablet (10 mg total) by mouth daily before breakfast. 30 tablet 2   ferrous sulfate 325 (65 FE) MG EC tablet Take 1 tablet (325 mg total) by mouth in the morning and at bedtime. 60 tablet 11   NICOTROL NS 10 MG/ML SOLN Place 0.1 mLs (1 mg total) into both nostrils as needed.  0   pantoprazole (PROTONIX) 40 MG tablet Take 40 mg by mouth daily.     PARoxetine (PAXIL) 40 MG tablet Take 40 mg by mouth daily.      pravastatin (PRAVACHOL) 80 MG tablet Take 1 tablet (80 mg total) by mouth every evening. 30 tablet 2   sacubitril-valsartan (ENTRESTO) 97-103 MG Take 1 tablet by mouth 2 (two) times daily. 60 tablet 2   traZODone (DESYREL) 100 MG  tablet Take 50 mg by mouth at bedtime as needed for sleep.      carvedilol (COREG) 6.25 MG tablet Take 1 tablet (6.25 mg total) by mouth 2 (two) times daily. 60 tablet 3   furosemide (LASIX) 20 MG tablet Take 1 tablet (20 mg total) by mouth daily. 90 tablet 3   spironolactone (ALDACTONE) 25 MG tablet Take 0.5 tablets (12.5 mg total) by mouth daily. 45 tablet 3   No current facility-administered medications for this visit.    Allergies:   Penicillins   Social History:  The patient  reports that he quit smoking about 1 years ago. His smoking use included cigarettes. He has a 53.00 pack-year smoking history. He has never used smokeless tobacco. He reports that he does not drink alcohol and does not use drugs.   Family History:  The patient's family history includes Cancer in his mother; Coronary artery disease in his brother; Diabetes in his brother; Stroke in his maternal grandmother.   ROS:  Please see the history of present illness.   Otherwise, review of systems is positive for none.   All other systems are reviewed and negative.   PHYSICAL EXAM: VS:  BP 118/70   Pulse 96   Ht '5\' 11"'$  (1.803 m)   Wt 184 lb  3.2 oz (83.6 kg)   SpO2 96%   BMI 25.69 kg/m  , BMI Body mass index is 25.69 kg/m. GEN: Well nourished, well developed, in no acute distress  HEENT: normal  Neck: no JVD, carotid bruits, or masses Cardiac: RRR; no murmurs, rubs, or gallops,no edema  Respiratory:  clear to auscultation bilaterally, normal work of breathing GI: soft, nontender, nondistended, + BS MS: no deformity or atrophy  Skin: warm and dry, device site well healed Neuro:  Strength and sensation are intact Psych: euthymic mood, full affect  EKG:  EKG is ordered today. Personal review of the ekg ordered shows sinus rhythm, ventricular paced  Personal review of the device interrogation today. Results in [place]: 03/22/2021: Magnesium 2.5 04/19/2021: ALT 11; BNP 60.4 07/29/2021: BUN 17; Creatinine, Ser 0.96; Hemoglobin 14.7; Platelets 252; Potassium 4.5; Sodium 137    Lipid Panel     Component Value Date/Time   CHOL 115 12/22/2019 0908   CHOL 110 05/22/2013 0526   TRIG 66 12/22/2019 0908   TRIG 162 05/22/2013 0526   HDL 46 12/22/2019 0908   HDL 17 (L) 05/22/2013 0526   CHOLHDL 2.5 12/22/2019 0908   CHOLHDL 3.1 10/08/2016 0507   VLDL 17 10/08/2016 0507   VLDL 32 05/22/2013 0526   LDLCALC 55 12/22/2019 0908   LDLCALC 61 05/22/2013 0526   LDLDIRECT 67 04/19/2021 1126     Wt Readings from Last 3 Encounters:  11/05/21 184 lb 3.2 oz (83.6 kg)  10/02/21 183 lb 9.6 oz (83.3 kg)  08/29/21 177 lb (80.3 kg)      Other studies Reviewed: Additional studies/ records that were reviewed today include: TTE 03/20/21  Review of the above records today demonstrates:   1. Left ventricular ejection fraction, by estimation, is 35 to 40%. The  left ventricle has moderately decreased function. The left ventricle  demonstrates regional wall motion abnormalities (see scoring  diagram/findings for description). Left ventricular   diastolic parameters are consistent with Grade I diastolic dysfunction  (impaired  relaxation). There is LV global hypokinesis with inferior and  inferolateral wall akinesis.   2. Right ventricular systolic function is normal. The right ventricular  size is  normal.   3. The mitral valve is normal in structure. Mild mitral valve  regurgitation.   4. The aortic valve is tricuspid. Aortic valve regurgitation is not  visualized. Aortic valve sclerosis is present, with no evidence of aortic  valve stenosis.   5. Aortic dilatation noted. There is mild dilatation of the aortic root,  measuring 40 mm.   6. The inferior vena cava is normal in size with <50% respiratory  variability, suggesting right atrial pressure of 8 mmHg.   RHC/LHC 06/07/21   Prox Cx to Mid Cx lesion is 40% stenosed.   Prox RCA to Mid RCA lesion is 10% stenosed.   There is moderate left ventricular systolic dysfunction.   LV end diastolic pressure is normal.  ASSESSMENT AND PLAN:  1.  Chronic systolic heart failure: Likely due to mixed ischemic and nonischemic cardiomyopathy.  Currently on optimal medical therapy with Entresto, carvedilol, Aldactone, Farxiga.  He is now status post Abbott CRT-P implanted 07/30/2021.  Device functioning appropriately.  No changes at this time.  2.  Coronary artery disease: Status post RCA stent.  Continue aspirin, statin, beta-blocker per primary cardiology.  3.  History of ischemic stroke: Currently on aspirin and Plavix  4.  Hyperlipidemia: Management per primary cardiology  5.  Hypertension: currently well controlled   Current medicines are reviewed at length with the patient today.   The patient does not have concerns regarding his medicines.  The following changes were made today:  none  Labs/ tests ordered today include:  Orders Placed This Encounter  Procedures   EKG 12-Lead     Disposition:   FU 12 months  Signed, Jullia Mulligan Meredith Leeds, MD  11/05/2021 2:53 PM     [facility] [address] [place] [address] [phone]  (office) [phone] (fax)

## 2021-11-06 LAB — CUP PACEART INCLINIC DEVICE CHECK
Date Time Interrogation Session: 20231017124545
Implantable Lead Implant Date: 20230711
Implantable Lead Implant Date: 20230711
Implantable Lead Implant Date: 20230711
Implantable Lead Location: 753858
Implantable Lead Location: 753859
Implantable Lead Location: 753860
Implantable Pulse Generator Implant Date: 20230711
Pulse Gen Model: 3562
Pulse Gen Serial Number: 8088605

## 2021-11-06 NOTE — Progress Notes (Signed)
Remote pacemaker transmission.   

## 2021-11-08 ENCOUNTER — Encounter: Payer: Medicare HMO | Admitting: Cardiology

## 2021-11-08 DIAGNOSIS — I25119 Atherosclerotic heart disease of native coronary artery with unspecified angina pectoris: Secondary | ICD-10-CM | POA: Diagnosis not present

## 2021-11-08 DIAGNOSIS — Z125 Encounter for screening for malignant neoplasm of prostate: Secondary | ICD-10-CM | POA: Diagnosis not present

## 2021-11-08 DIAGNOSIS — E119 Type 2 diabetes mellitus without complications: Secondary | ICD-10-CM | POA: Diagnosis not present

## 2021-11-08 DIAGNOSIS — J449 Chronic obstructive pulmonary disease, unspecified: Secondary | ICD-10-CM | POA: Diagnosis not present

## 2021-11-14 DIAGNOSIS — Z1211 Encounter for screening for malignant neoplasm of colon: Secondary | ICD-10-CM | POA: Diagnosis not present

## 2021-11-14 DIAGNOSIS — F325 Major depressive disorder, single episode, in full remission: Secondary | ICD-10-CM | POA: Diagnosis not present

## 2021-11-14 DIAGNOSIS — Z1331 Encounter for screening for depression: Secondary | ICD-10-CM | POA: Diagnosis not present

## 2021-11-14 DIAGNOSIS — J449 Chronic obstructive pulmonary disease, unspecified: Secondary | ICD-10-CM | POA: Diagnosis not present

## 2021-11-14 DIAGNOSIS — I110 Hypertensive heart disease with heart failure: Secondary | ICD-10-CM | POA: Diagnosis not present

## 2021-11-14 DIAGNOSIS — I25119 Atherosclerotic heart disease of native coronary artery with unspecified angina pectoris: Secondary | ICD-10-CM | POA: Diagnosis not present

## 2021-11-14 DIAGNOSIS — E119 Type 2 diabetes mellitus without complications: Secondary | ICD-10-CM | POA: Diagnosis not present

## 2021-11-14 DIAGNOSIS — Z0000 Encounter for general adult medical examination without abnormal findings: Secondary | ICD-10-CM | POA: Diagnosis not present

## 2021-11-14 DIAGNOSIS — I5022 Chronic systolic (congestive) heart failure: Secondary | ICD-10-CM | POA: Diagnosis not present

## 2021-11-26 DIAGNOSIS — J441 Chronic obstructive pulmonary disease with (acute) exacerbation: Secondary | ICD-10-CM | POA: Diagnosis not present

## 2021-12-02 ENCOUNTER — Ambulatory Visit (HOSPITAL_COMMUNITY): Payer: Medicare HMO | Attending: Cardiovascular Disease

## 2021-12-02 DIAGNOSIS — I5022 Chronic systolic (congestive) heart failure: Secondary | ICD-10-CM

## 2021-12-02 LAB — ECHOCARDIOGRAM COMPLETE
Area-P 1/2: 3.11 cm2
S' Lateral: 4.1 cm

## 2021-12-04 ENCOUNTER — Emergency Department: Payer: Medicare HMO

## 2021-12-04 ENCOUNTER — Emergency Department
Admission: EM | Admit: 2021-12-04 | Discharge: 2021-12-04 | Disposition: A | Discharge status: Home / Self Care | Payer: Medicare HMO | Attending: Emergency Medicine | Admitting: Emergency Medicine

## 2021-12-04 DIAGNOSIS — I10 Essential (primary) hypertension: Secondary | ICD-10-CM | POA: Diagnosis not present

## 2021-12-04 DIAGNOSIS — W19XXXA Unspecified fall, initial encounter: Secondary | ICD-10-CM | POA: Diagnosis not present

## 2021-12-04 DIAGNOSIS — I959 Hypotension, unspecified: Secondary | ICD-10-CM | POA: Diagnosis not present

## 2021-12-04 DIAGNOSIS — J439 Emphysema, unspecified: Secondary | ICD-10-CM | POA: Diagnosis not present

## 2021-12-04 DIAGNOSIS — R58 Hemorrhage, not elsewhere classified: Secondary | ICD-10-CM | POA: Diagnosis not present

## 2021-12-04 DIAGNOSIS — S0121XA Laceration without foreign body of nose, initial encounter: Secondary | ICD-10-CM | POA: Diagnosis not present

## 2021-12-04 DIAGNOSIS — I639 Cerebral infarction, unspecified: Secondary | ICD-10-CM | POA: Diagnosis not present

## 2021-12-04 DIAGNOSIS — S0081XA Abrasion of other part of head, initial encounter: Secondary | ICD-10-CM

## 2021-12-04 DIAGNOSIS — R0689 Other abnormalities of breathing: Secondary | ICD-10-CM | POA: Diagnosis not present

## 2021-12-04 DIAGNOSIS — S0992XA Unspecified injury of nose, initial encounter: Secondary | ICD-10-CM | POA: Diagnosis present

## 2021-12-04 DIAGNOSIS — S0990XA Unspecified injury of head, initial encounter: Secondary | ICD-10-CM | POA: Diagnosis not present

## 2021-12-04 DIAGNOSIS — S199XXA Unspecified injury of neck, initial encounter: Secondary | ICD-10-CM | POA: Diagnosis not present

## 2021-12-04 DIAGNOSIS — S0083XA Contusion of other part of head, initial encounter: Secondary | ICD-10-CM

## 2021-12-04 DIAGNOSIS — W1839XA Other fall on same level, initial encounter: Secondary | ICD-10-CM | POA: Diagnosis not present

## 2021-12-04 DIAGNOSIS — S0993XA Unspecified injury of face, initial encounter: Secondary | ICD-10-CM | POA: Diagnosis not present

## 2021-12-04 DIAGNOSIS — T68XXXA Hypothermia, initial encounter: Secondary | ICD-10-CM | POA: Diagnosis not present

## 2021-12-04 DIAGNOSIS — S0181XA Laceration without foreign body of other part of head, initial encounter: Secondary | ICD-10-CM | POA: Diagnosis not present

## 2021-12-04 MED ORDER — TETANUS-DIPHTH-ACELL PERTUSSIS 5-2.5-18.5 LF-MCG/0.5 IM SUSY: 0.5000 mL | PREFILLED_SYRINGE | Freq: Once | INTRAMUSCULAR | Status: DC

## 2021-12-04 NOTE — ED Triage Notes (Signed)
Fell on concrete drive, tipped.  Fell onto face.  No LOC.  Lac to right eyebrow and bridge of nose.  AAOx3.  Skin warm and dry. NAD

## 2021-12-04 NOTE — ED Provider Notes (Signed)
[facility] Provider Note   Event Date/Time   First MD Initiated Contact with Patient 12/04/21 1251     (approximate) History  Head Injury  HPI Louis Gutierrez is a 71 y.o. male with a stated past medical history of hypertension and hypercholesterolemia who presents after mechanical fall from standing while stepping off a curb onto concrete.  Patient states that he is on a blood thinner for "heart problems" but does not know what those are.  Patient denies any loss of consciousness, lightheadedness, or balance issues since the fall.  Patient complains of abrasion/laceration to the nose as well as the forehead. ROS: Patient currently denies any vision changes, tinnitus, difficulty speaking, facial droop, sore throat, chest pain, shortness of breath, abdominal pain, nausea/vomiting/diarrhea, dysuria, or weakness/numbness/paresthesias in any extremity   Physical Exam  Triage Vital Signs: ED Triage Vitals  Enc Vitals Group     BP 12/04/21 1243 (!) 141/72     Pulse Rate 12/04/21 1243 60     Resp 12/04/21 1243 16     Temp 12/04/21 1243 97.9 F (36.6 C)     Temp Source 12/04/21 1243 Oral     SpO2 12/04/21 1243 94 %     Weight 12/04/21 1241 184 lb 4.9 oz (83.6 kg)     Height 12/04/21 1241 '5\' 11"'$  (1.803 m)     Head Circumference --      Peak Flow --      Pain Score 12/04/21 1241 5     Pain Loc --      Pain Edu? --      Excl. in [place]? --    Most recent vital signs: Vitals:   12/04/21 1243  BP: (!) 141/72  Pulse: 60  Resp: 16  Temp: 97.9 F (36.6 C)  SpO2: 94%   General: Awake, oriented x4. CV:  Good peripheral perfusion.  Resp:  Normal effort.  Abd:  No distention.  Other:  Elderly Caucasian male laying in bed in no acute distress.  Superficial abrasion with hematoma to the right forehead as well as a small superficial laceration over the bridge of the nose ED Results / Procedures / Treatments   RADIOLOGY ED MD interpretation: CT of the head without  contrast interpreted by me shows no evidence of acute abnormalities including no intracerebral hemorrhage, obvious masses, or significant edema  CT of the cervical spine interpreted by me does not show any evidence of acute abnormalities including no acute fracture, malalignment, height loss, or dislocation  CT of the maxillofacial structures interpreted by me and shows no evidence of acute abnormalities including no acute fractures, malalignment, or dislocation -Agree with radiology assessment Official radiology report(s): CT Head Wo Contrast  Result Date: 12/04/2021 CLINICAL DATA:  Head trauma, minor (Age >= 65y); Facial trauma, blunt; Neck trauma (Age >= 65y) EXAM: CT HEAD WITHOUT CONTRAST CT MAXILLOFACIAL WITHOUT CONTRAST CT CERVICAL SPINE WITHOUT CONTRAST TECHNIQUE: Multidetector CT imaging of the head, cervical spine, and maxillofacial structures were performed using the standard protocol without intravenous contrast. Multiplanar CT image reconstructions of the cervical spine and maxillofacial structures were also generated. RADIATION DOSE REDUCTION: This exam was performed according to the departmental dose-optimization program which includes automated exposure control, adjustment of the mA and/or kV according to patient size and/or use of iterative reconstruction technique. COMPARISON:  None Available. FINDINGS: CT HEAD FINDINGS Brain: No evidence of acute infarction, hemorrhage, hydrocephalus, extra-axial collection or mass lesion/mass effect. Remote perforator infarct in the left basal ganglia.  Vascular: No hyperdense vessel identified. Skull: No acute fracture. Other: No mastoid effusions. CT MAXILLOFACIAL FINDINGS Osseous: No fracture or mandibular dislocation. No destructive process. Orbits: Negative. No traumatic or inflammatory finding. Sinuses: Clear. Soft tissues: Right forehead contusion. CT CERVICAL SPINE FINDINGS Alignment: Straightening.  No substantial sagittal subluxation. Skull base  and vertebrae: C3-C4 and C6-C7 interbody fusion. No evidence of acute fracture. Osteopenia. Soft tissues and spinal canal: No prevertebral fluid or swelling. No visible canal hematoma. Disc levels: Multilevel facet and uncovertebral hypertrophy with varying degrees of neural foraminal stenosis, probably greatest at the levels of fusion. Upper chest: Emphysema. IMPRESSION: 1. No evidence of acute intracranial abnormality or facial fracture. Right forehead contusion. 2. No evidence of acute fracture or traumatic malalignment in the cervical spine. 3. Multilevel degenerative change including multilevel foraminal stenosis. MRI could further characterize if clinically warranted. 4. Emphysema (ICD10-J43.9). Electronically Signed   By: Margaretha Sheffield M.D.   On: 12/04/2021 14:20   CT Cervical Spine Wo Contrast  Result Date: 12/04/2021 CLINICAL DATA:  Head trauma, minor (Age >= 65y); Facial trauma, blunt; Neck trauma (Age >= 65y) EXAM: CT HEAD WITHOUT CONTRAST CT MAXILLOFACIAL WITHOUT CONTRAST CT CERVICAL SPINE WITHOUT CONTRAST TECHNIQUE: Multidetector CT imaging of the head, cervical spine, and maxillofacial structures were performed using the standard protocol without intravenous contrast. Multiplanar CT image reconstructions of the cervical spine and maxillofacial structures were also generated. RADIATION DOSE REDUCTION: This exam was performed according to the departmental dose-optimization program which includes automated exposure control, adjustment of the mA and/or kV according to patient size and/or use of iterative reconstruction technique. COMPARISON:  None Available. FINDINGS: CT HEAD FINDINGS Brain: No evidence of acute infarction, hemorrhage, hydrocephalus, extra-axial collection or mass lesion/mass effect. Remote perforator infarct in the left basal ganglia. Vascular: No hyperdense vessel identified. Skull: No acute fracture. Other: No mastoid effusions. CT MAXILLOFACIAL FINDINGS Osseous: No fracture or  mandibular dislocation. No destructive process. Orbits: Negative. No traumatic or inflammatory finding. Sinuses: Clear. Soft tissues: Right forehead contusion. CT CERVICAL SPINE FINDINGS Alignment: Straightening.  No substantial sagittal subluxation. Skull base and vertebrae: C3-C4 and C6-C7 interbody fusion. No evidence of acute fracture. Osteopenia. Soft tissues and spinal canal: No prevertebral fluid or swelling. No visible canal hematoma. Disc levels: Multilevel facet and uncovertebral hypertrophy with varying degrees of neural foraminal stenosis, probably greatest at the levels of fusion. Upper chest: Emphysema. IMPRESSION: 1. No evidence of acute intracranial abnormality or facial fracture. Right forehead contusion. 2. No evidence of acute fracture or traumatic malalignment in the cervical spine. 3. Multilevel degenerative change including multilevel foraminal stenosis. MRI could further characterize if clinically warranted. 4. Emphysema (ICD10-J43.9). Electronically Signed   By: Margaretha Sheffield M.D.   On: 12/04/2021 14:20   CT Maxillofacial WO CM  Result Date: 12/04/2021 CLINICAL DATA:  Head trauma, minor (Age >= 65y); Facial trauma, blunt; Neck trauma (Age >= 65y) EXAM: CT HEAD WITHOUT CONTRAST CT MAXILLOFACIAL WITHOUT CONTRAST CT CERVICAL SPINE WITHOUT CONTRAST TECHNIQUE: Multidetector CT imaging of the head, cervical spine, and maxillofacial structures were performed using the standard protocol without intravenous contrast. Multiplanar CT image reconstructions of the cervical spine and maxillofacial structures were also generated. RADIATION DOSE REDUCTION: This exam was performed according to the departmental dose-optimization program which includes automated exposure control, adjustment of the mA and/or kV according to patient size and/or use of iterative reconstruction technique. COMPARISON:  None Available. FINDINGS: CT HEAD FINDINGS Brain: No evidence of acute infarction, hemorrhage,  hydrocephalus, extra-axial collection or mass lesion/mass effect.  Remote perforator infarct in the left basal ganglia. Vascular: No hyperdense vessel identified. Skull: No acute fracture. Other: No mastoid effusions. CT MAXILLOFACIAL FINDINGS Osseous: No fracture or mandibular dislocation. No destructive process. Orbits: Negative. No traumatic or inflammatory finding. Sinuses: Clear. Soft tissues: Right forehead contusion. CT CERVICAL SPINE FINDINGS Alignment: Straightening.  No substantial sagittal subluxation. Skull base and vertebrae: C3-C4 and C6-C7 interbody fusion. No evidence of acute fracture. Osteopenia. Soft tissues and spinal canal: No prevertebral fluid or swelling. No visible canal hematoma. Disc levels: Multilevel facet and uncovertebral hypertrophy with varying degrees of neural foraminal stenosis, probably greatest at the levels of fusion. Upper chest: Emphysema. IMPRESSION: 1. No evidence of acute intracranial abnormality or facial fracture. Right forehead contusion. 2. No evidence of acute fracture or traumatic malalignment in the cervical spine. 3. Multilevel degenerative change including multilevel foraminal stenosis. MRI could further characterize if clinically warranted. 4. Emphysema (ICD10-J43.9). Electronically Signed   By: Margaretha Sheffield M.D.   On: 12/04/2021 14:20   PROCEDURES: Critical Care performed: No ..Laceration Repair  Date/Time: 12/04/2021 3:11 PM  Performed by: Naaman Plummer, MD Authorized by: Naaman Plummer, MD   Consent:    Consent obtained:  Verbal   Consent given by:  Patient   Risks, benefits, and alternatives were discussed: yes     Risks discussed:  Infection, pain, retained foreign body, need for additional repair, poor cosmetic result and poor wound healing   Alternatives discussed:  No treatment, delayed treatment, observation and referral Universal protocol:    Immediately prior to procedure, a time out was called: yes     Patient identity  confirmed:  Verbally with patient Anesthesia:    Anesthesia method:  None Laceration details:    Location:  Face   Face location:  Nose   Length (cm):  1   Depth (mm):  5 Pre-procedure details:    Preparation:  Patient was prepped and draped in usual sterile fashion and imaging obtained to evaluate for foreign bodies Exploration:    Wound exploration: entire depth of wound visualized     Contaminated: no   Treatment:    Area cleansed with:  Povidone-iodine and saline   Amount of cleaning:  Standard Skin repair:    Repair method:  Tissue adhesive Approximation:    Approximation:  Close Repair type:    Repair type:  Simple Post-procedure details:    Dressing:  Open (no dressing)   Procedure completion:  Tolerated  MEDICATIONS ORDERED IN ED: Medications - No data to display IMPRESSION / MDM / [place] / ED COURSE  I reviewed the triage vital signs and the nursing notes.                              Patient's presentation is most consistent with acute presentation with potential threat to life or bodily function. Presenting after a fall that occurred just prior to arrival, resulting in injury to the forehead and the bridge of the nose. The mechanism of injury was a mechanical ground level fall without syncope or near-syncope. The current level of pain is moderate. There was no loss of consciousness, confusion, seizure, or memory impairment. There is a laceration associated with the injury. Denies neck pain. The patient does take blood thinner medications. Denies vomiting, numbness/weakness, fever Tetanus updated Dispo: Discharge with PCP follow-up    FINAL CLINICAL IMPRESSION(S) / ED DIAGNOSES   Final diagnoses:  Fall, initial encounter  Injury of head, initial encounter  Contusion of forehead, initial encounter  Abrasion of forehead, initial encounter   Rx / DC Orders   ED Discharge Orders     None      Note:  This document was prepared using Dragon  voice recognition software and may include unintentional dictation errors.   Naaman Plummer, MD 12/04/21 [phone]

## 2021-12-04 NOTE — ED Notes (Signed)
Patient was discharged before receiving his Tdap injection

## 2021-12-09 DIAGNOSIS — S0993XA Unspecified injury of face, initial encounter: Secondary | ICD-10-CM | POA: Diagnosis not present

## 2021-12-09 DIAGNOSIS — I5022 Chronic systolic (congestive) heart failure: Secondary | ICD-10-CM | POA: Diagnosis not present

## 2021-12-09 DIAGNOSIS — S0083XA Contusion of other part of head, initial encounter: Secondary | ICD-10-CM | POA: Diagnosis not present

## 2021-12-11 ENCOUNTER — Other Ambulatory Visit: Payer: Self-pay | Admitting: Cardiovascular Disease

## 2022-01-31 ENCOUNTER — Ambulatory Visit (INDEPENDENT_AMBULATORY_CARE_PROVIDER_SITE_OTHER): Payer: Medicare HMO

## 2022-01-31 DIAGNOSIS — I639 Cerebral infarction, unspecified: Secondary | ICD-10-CM

## 2022-01-31 LAB — CUP PACEART REMOTE DEVICE CHECK
Battery Remaining Longevity: 83 mo
Battery Remaining Percentage: 94 %
Battery Voltage: 2.99 V
Brady Statistic AP VP Percent: 10 %
Brady Statistic AP VS Percent: 1 %
Brady Statistic AS VP Percent: 89 %
Brady Statistic AS VS Percent: 1 %
Brady Statistic RA Percent Paced: 9.7 %
Date Time Interrogation Session: 20240112020009
Implantable Lead Connection Status: 753985
Implantable Lead Connection Status: 753985
Implantable Lead Connection Status: 753985
Implantable Lead Implant Date: 20230711
Implantable Lead Implant Date: 20230711
Implantable Lead Implant Date: 20230711
Implantable Lead Location: 753858
Implantable Lead Location: 753859
Implantable Lead Location: 753860
Implantable Pulse Generator Implant Date: 20230711
Lead Channel Impedance Value: 1025 Ohm
Lead Channel Impedance Value: 450 Ohm
Lead Channel Impedance Value: 510 Ohm
Lead Channel Pacing Threshold Amplitude: 0.5 V
Lead Channel Pacing Threshold Amplitude: 0.5 V
Lead Channel Pacing Threshold Amplitude: 1.25 V
Lead Channel Pacing Threshold Pulse Width: 0.5 ms
Lead Channel Pacing Threshold Pulse Width: 0.5 ms
Lead Channel Pacing Threshold Pulse Width: 1 ms
Lead Channel Sensing Intrinsic Amplitude: 12 mV
Lead Channel Sensing Intrinsic Amplitude: 5 mV
Lead Channel Setting Pacing Amplitude: 2 V
Lead Channel Setting Pacing Amplitude: 2.5 V
Lead Channel Setting Pacing Amplitude: 2.5 V
Lead Channel Setting Pacing Pulse Width: 0.5 ms
Lead Channel Setting Pacing Pulse Width: 0.8 ms
Lead Channel Setting Sensing Sensitivity: 2 mV
Pulse Gen Model: 3562
Pulse Gen Serial Number: 8088605

## 2022-02-18 NOTE — Progress Notes (Signed)
Remote pacemaker transmission.   

## 2022-02-25 DIAGNOSIS — Z86010 Personal history of colonic polyps: Secondary | ICD-10-CM | POA: Diagnosis not present

## 2022-02-25 DIAGNOSIS — Z7902 Long term (current) use of antithrombotics/antiplatelets: Secondary | ICD-10-CM | POA: Diagnosis not present

## 2022-02-25 DIAGNOSIS — K219 Gastro-esophageal reflux disease without esophagitis: Secondary | ICD-10-CM | POA: Diagnosis not present

## 2022-02-26 ENCOUNTER — Telehealth: Payer: Self-pay | Admitting: *Deleted

## 2022-02-26 NOTE — Telephone Encounter (Signed)
   Pre-operative Risk Assessment    Patient Name: Louis Gutierrez  DOB: 06-10-1950 MRN: [id number]      Request for Surgical Clearance    Procedure:   COLONOSCOPY  Date of Surgery:  Clearance 05/26/22                                 Surgeon:   Surgeon's Group or Practice Name:  [facility] / GI Phone number:   Fax number:  [phone]   Type of Clearance Requested:   - Pharmacy:  Hold Clopidogrel (Plavix) X'S 5 DAYS   Type of Anesthesia:  Not Indicated   Additional requests/questions:    Astrid Divine   02/26/2022, 10:09 AM

## 2022-02-26 NOTE — Telephone Encounter (Addendum)
   Patient Name: Louis Gutierrez  DOB: [birth year] MRN: [id number]  Primary Cardiologist: Sanda Klein, MD  Chart reviewed as part of pre-operative protocol coverage.   Attempted to contact patient to discuss request to hold Plavix prior to upcoming colonoscopy.  No answer.  Of note, he was previously cleared to hold Plavix for 5 days prior to colonoscopy.  Left voicemail for patient to call back to discuss recommendations at earliest convenience.  Lenna Sciara, NP 02/26/2022, 12:20 PM

## 2022-02-26 NOTE — Telephone Encounter (Signed)
Per Fabio Asa NP pre op APP today, states sent to pre op call back in error and to disregard.

## 2022-02-27 NOTE — Telephone Encounter (Signed)
   Name: Louis Gutierrez  DOB: [birth year]  MRN: [id number]   Primary Cardiologist: Sanda Klein, MD  Chart reviewed as part of pre-operative protocol coverage. Patient was contacted 02/27/2022 in reference to holding his Plavix for 5 days prior to his scheduled colonoscopy on 05/26/2022.  He was advised to hold Plavix 5 days based on previous recommendations and patient had no further questions at that time.  He was advised to contact our office if he had any further questions.  I will route recommendations to requesting office and request will be removed from preop box at this time.  Mable Fill, Marissa Nestle, NP 02/27/2022, 10:15 AM

## 2022-03-04 ENCOUNTER — Encounter: Payer: Medicare HMO | Admitting: Cardiology

## 2022-03-04 ENCOUNTER — Telehealth: Payer: Self-pay | Admitting: Cardiology

## 2022-03-04 NOTE — Telephone Encounter (Signed)
Have attempted to call patient several times and lvm multiple voicemails as we need to reschedule his appointment w/ Dr Brand Males, NT

## 2022-03-10 ENCOUNTER — Telehealth: Payer: Self-pay | Admitting: Cardiology

## 2022-03-10 NOTE — Telephone Encounter (Signed)
Called and LVM in attempt to reschedule patient appointment with Dr. Brand Males, NT

## 2022-03-13 ENCOUNTER — Other Ambulatory Visit: Payer: Self-pay | Admitting: Cardiovascular Disease

## 2022-03-20 ENCOUNTER — Other Ambulatory Visit: Payer: Self-pay

## 2022-03-20 MED ORDER — DAPAGLIFLOZIN PROPANEDIOL 10 MG PO TABS: 10.0000 mg | ORAL_TABLET | Freq: Every day | ORAL | 3 refills | Status: DC

## 2022-03-20 NOTE — Telephone Encounter (Signed)
Prescription sent to pharmacy.

## 2022-03-20 NOTE — Telephone Encounter (Signed)
Dapagliflozin is the generic name for Wilder Glade,. Just reorder it with that name if that is the problem. Make sure it is prescribed for heart failure, not for diabetes.

## 2022-03-21 ENCOUNTER — Other Ambulatory Visit: Payer: Self-pay | Admitting: *Deleted

## 2022-04-30 DIAGNOSIS — I5022 Chronic systolic (congestive) heart failure: Secondary | ICD-10-CM | POA: Diagnosis not present

## 2022-04-30 DIAGNOSIS — F325 Major depressive disorder, single episode, in full remission: Secondary | ICD-10-CM | POA: Diagnosis not present

## 2022-04-30 DIAGNOSIS — E119 Type 2 diabetes mellitus without complications: Secondary | ICD-10-CM | POA: Diagnosis not present

## 2022-04-30 DIAGNOSIS — I25119 Atherosclerotic heart disease of native coronary artery with unspecified angina pectoris: Secondary | ICD-10-CM | POA: Diagnosis not present

## 2022-04-30 DIAGNOSIS — I10 Essential (primary) hypertension: Secondary | ICD-10-CM | POA: Diagnosis not present

## 2022-04-30 DIAGNOSIS — J449 Chronic obstructive pulmonary disease, unspecified: Secondary | ICD-10-CM | POA: Diagnosis not present

## 2022-05-02 ENCOUNTER — Ambulatory Visit (INDEPENDENT_AMBULATORY_CARE_PROVIDER_SITE_OTHER): Payer: Medicare HMO

## 2022-05-02 DIAGNOSIS — I5042 Chronic combined systolic (congestive) and diastolic (congestive) heart failure: Secondary | ICD-10-CM | POA: Diagnosis not present

## 2022-05-02 LAB — CUP PACEART REMOTE DEVICE CHECK
Battery Remaining Longevity: 83 mo
Battery Remaining Percentage: 91 %
Battery Voltage: 2.99 V
Brady Statistic AP VP Percent: 9.4 %
Brady Statistic AP VS Percent: 1 %
Brady Statistic AS VP Percent: 90 %
Brady Statistic AS VS Percent: 1 %
Brady Statistic RA Percent Paced: 8.7 %
Date Time Interrogation Session: 20240412020009
Implantable Lead Connection Status: 753985
Implantable Lead Connection Status: 753985
Implantable Lead Connection Status: 753985
Implantable Lead Implant Date: 20230711
Implantable Lead Implant Date: 20230711
Implantable Lead Implant Date: 20230711
Implantable Lead Location: 753858
Implantable Lead Location: 753859
Implantable Lead Location: 753860
Implantable Pulse Generator Implant Date: 20230711
Lead Channel Impedance Value: 1175 Ohm
Lead Channel Impedance Value: 450 Ohm
Lead Channel Impedance Value: 560 Ohm
Lead Channel Pacing Threshold Amplitude: 0.5 V
Lead Channel Pacing Threshold Amplitude: 0.5 V
Lead Channel Pacing Threshold Amplitude: 1.25 V
Lead Channel Pacing Threshold Pulse Width: 0.5 ms
Lead Channel Pacing Threshold Pulse Width: 0.5 ms
Lead Channel Pacing Threshold Pulse Width: 1 ms
Lead Channel Sensing Intrinsic Amplitude: 12 mV
Lead Channel Sensing Intrinsic Amplitude: 5 mV
Lead Channel Setting Pacing Amplitude: 2 V
Lead Channel Setting Pacing Amplitude: 2.5 V
Lead Channel Setting Pacing Amplitude: 2.5 V
Lead Channel Setting Pacing Pulse Width: 0.5 ms
Lead Channel Setting Pacing Pulse Width: 0.8 ms
Lead Channel Setting Sensing Sensitivity: 2 mV
Pulse Gen Model: 3562
Pulse Gen Serial Number: 8088605

## 2022-05-10 ENCOUNTER — Other Ambulatory Visit: Payer: Self-pay | Admitting: Family

## 2022-05-12 ENCOUNTER — Telehealth: Payer: Self-pay | Admitting: Emergency Medicine

## 2022-05-12 DIAGNOSIS — E119 Type 2 diabetes mellitus without complications: Secondary | ICD-10-CM | POA: Diagnosis not present

## 2022-05-12 DIAGNOSIS — I25119 Atherosclerotic heart disease of native coronary artery with unspecified angina pectoris: Secondary | ICD-10-CM | POA: Diagnosis not present

## 2022-05-12 DIAGNOSIS — I10 Essential (primary) hypertension: Secondary | ICD-10-CM | POA: Diagnosis not present

## 2022-05-12 MED ORDER — EMPAGLIFLOZIN 10 MG PO TABS: 10.0000 mg | ORAL_TABLET | Freq: Every day | ORAL | 3 refills | Status: DC

## 2022-05-12 NOTE — Telephone Encounter (Signed)
Pt responded to [employer], confirmation

## 2022-05-19 ENCOUNTER — Encounter: Payer: Self-pay | Admitting: *Deleted

## 2022-05-19 DIAGNOSIS — I5022 Chronic systolic (congestive) heart failure: Secondary | ICD-10-CM | POA: Diagnosis not present

## 2022-05-19 DIAGNOSIS — E119 Type 2 diabetes mellitus without complications: Secondary | ICD-10-CM | POA: Diagnosis not present

## 2022-05-19 DIAGNOSIS — Z87891 Personal history of nicotine dependence: Secondary | ICD-10-CM | POA: Diagnosis not present

## 2022-05-19 DIAGNOSIS — I10 Essential (primary) hypertension: Secondary | ICD-10-CM | POA: Diagnosis not present

## 2022-05-19 DIAGNOSIS — I25119 Atherosclerotic heart disease of native coronary artery with unspecified angina pectoris: Secondary | ICD-10-CM | POA: Diagnosis not present

## 2022-05-19 DIAGNOSIS — F325 Major depressive disorder, single episode, in full remission: Secondary | ICD-10-CM | POA: Diagnosis not present

## 2022-05-19 DIAGNOSIS — I110 Hypertensive heart disease with heart failure: Secondary | ICD-10-CM | POA: Diagnosis not present

## 2022-05-19 DIAGNOSIS — J449 Chronic obstructive pulmonary disease, unspecified: Secondary | ICD-10-CM | POA: Diagnosis not present

## 2022-05-19 DIAGNOSIS — Z0100 Encounter for examination of eyes and vision without abnormal findings: Secondary | ICD-10-CM | POA: Diagnosis not present

## 2022-05-23 ENCOUNTER — Encounter: Payer: Self-pay | Admitting: *Deleted

## 2022-05-26 ENCOUNTER — Ambulatory Visit: Payer: Medicare HMO | Admitting: Anesthesiology

## 2022-05-26 ENCOUNTER — Encounter: Payer: Self-pay | Admitting: *Deleted

## 2022-05-26 ENCOUNTER — Ambulatory Visit
Admission: RE | Admit: 2022-05-26 | Discharge: 2022-05-26 | Disposition: A | Discharge status: Home / Self Care | Payer: Medicare HMO | Source: Ambulatory Visit | Attending: Gastroenterology | Admitting: Gastroenterology

## 2022-05-26 ENCOUNTER — Encounter
Admission: RE | Disposition: A | Discharge status: Home / Self Care | Payer: Self-pay | Source: Ambulatory Visit | Attending: Gastroenterology

## 2022-05-26 DIAGNOSIS — K642 Third degree hemorrhoids: Secondary | ICD-10-CM | POA: Diagnosis not present

## 2022-05-26 DIAGNOSIS — J4489 Other specified chronic obstructive pulmonary disease: Secondary | ICD-10-CM | POA: Diagnosis not present

## 2022-05-26 DIAGNOSIS — I5041 Acute combined systolic (congestive) and diastolic (congestive) heart failure: Secondary | ICD-10-CM | POA: Diagnosis not present

## 2022-05-26 DIAGNOSIS — Z7901 Long term (current) use of anticoagulants: Secondary | ICD-10-CM | POA: Diagnosis not present

## 2022-05-26 DIAGNOSIS — K649 Unspecified hemorrhoids: Secondary | ICD-10-CM | POA: Diagnosis not present

## 2022-05-26 DIAGNOSIS — Z86010 Personal history of colonic polyps: Secondary | ICD-10-CM | POA: Diagnosis not present

## 2022-05-26 DIAGNOSIS — E119 Type 2 diabetes mellitus without complications: Secondary | ICD-10-CM | POA: Diagnosis not present

## 2022-05-26 DIAGNOSIS — D125 Benign neoplasm of sigmoid colon: Secondary | ICD-10-CM | POA: Diagnosis not present

## 2022-05-26 DIAGNOSIS — I110 Hypertensive heart disease with heart failure: Secondary | ICD-10-CM | POA: Diagnosis not present

## 2022-05-26 DIAGNOSIS — I252 Old myocardial infarction: Secondary | ICD-10-CM | POA: Diagnosis not present

## 2022-05-26 DIAGNOSIS — Z1211 Encounter for screening for malignant neoplasm of colon: Secondary | ICD-10-CM | POA: Insufficient documentation

## 2022-05-26 DIAGNOSIS — Z7984 Long term (current) use of oral hypoglycemic drugs: Secondary | ICD-10-CM | POA: Diagnosis not present

## 2022-05-26 DIAGNOSIS — Z87891 Personal history of nicotine dependence: Secondary | ICD-10-CM | POA: Diagnosis not present

## 2022-05-26 DIAGNOSIS — Z09 Encounter for follow-up examination after completed treatment for conditions other than malignant neoplasm: Secondary | ICD-10-CM | POA: Diagnosis not present

## 2022-05-26 DIAGNOSIS — I2510 Atherosclerotic heart disease of native coronary artery without angina pectoris: Secondary | ICD-10-CM | POA: Diagnosis not present

## 2022-05-26 DIAGNOSIS — I509 Heart failure, unspecified: Secondary | ICD-10-CM | POA: Diagnosis not present

## 2022-05-26 DIAGNOSIS — K635 Polyp of colon: Secondary | ICD-10-CM | POA: Diagnosis not present

## 2022-05-26 HISTORY — DX: Heart failure, unspecified: I50.9

## 2022-05-26 HISTORY — DX: Presence of cardiac pacemaker: Z95.0

## 2022-05-26 HISTORY — PX: COLONOSCOPY WITH PROPOFOL: SHX5780

## 2022-05-26 SURGERY — COLONOSCOPY WITH PROPOFOL
Anesthesia: General

## 2022-05-26 MED ORDER — PROPOFOL 10 MG/ML IV BOLUS
INTRAVENOUS | Status: DC | PRN
  Administered 2022-05-26: 50 mg via INTRAVENOUS
  Administered 2022-05-26: 20 mg via INTRAVENOUS

## 2022-05-26 MED ORDER — PROPOFOL 500 MG/50ML IV EMUL
INTRAVENOUS | Status: DC | PRN
  Administered 2022-05-26: 165 ug/kg/min via INTRAVENOUS

## 2022-05-26 MED ORDER — SODIUM CHLORIDE 0.9 % IV SOLN: INTRAVENOUS | Status: DC

## 2022-05-26 MED ORDER — LIDOCAINE HCL (CARDIAC) PF 100 MG/5ML IV SOSY
PREFILLED_SYRINGE | INTRAVENOUS | Status: DC | PRN
  Administered 2022-05-26: 100 mg via INTRAVENOUS

## 2022-05-26 NOTE — Anesthesia Preprocedure Evaluation (Signed)
Anesthesia Evaluation  Patient identified by MRN, date of birth, ID band Patient awake    Reviewed: Allergy & Precautions, NPO status , Patient's Chart, lab work & pertinent test results  History of Anesthesia Complications (+) history of anesthetic complications (narcan)  Airway Mallampati: III  TM Distance: <3 FB Neck ROM: full    Dental  (+) Missing   Pulmonary shortness of breath and with exertion, asthma , sleep apnea , COPD, former smoker   Pulmonary exam normal        Cardiovascular hypertension, (-) angina + CAD, + Past MI and +CHF  Normal cardiovascular exam+ pacemaker      Neuro/Psych  PSYCHIATRIC DISORDERS      CVA    GI/Hepatic Neg liver ROS,GERD  Controlled,,  Endo/Other  negative endocrine ROSdiabetes, Type 2    Renal/GU Renal disease  negative genitourinary   Musculoskeletal   Abdominal   Peds  Hematology negative hematology ROS (+)   Anesthesia Other Findings Past Medical History: No date: Adrenal adenoma, left No date: Anxiety No date: Aortic atherosclerosis (HCC) No date: Asthma without status asthmaticus No date: Atherosclerotic cerebrovascular disease     Comment:  Overview:  Small and facial weakness with minimal               weakness  No date: CAD (coronary artery disease)     Comment:  a. 05/2010 Inf STEMI/PCI: RCA 176m (3.5x24 Promus DES),               LCX small, 100, D1 50ost, D2 40ost, EF 45-50%; b. 03/2013               Cath ([facility]): 40% mLAD, 20% stenosis mRCA (site of               the previous stent); LVEF 55%. No date: Cervical disc disease     Comment:  a. s/p C3-4 fusion 2003. No date: Cervical myelopathy (HCC)     Comment:  a. 09/2016 Admit w/ left sided wkns->found to have               progressive cervical disc dzs above and below prior               fusioin site; b. 10/2016 s/p ant cervical diskectomy. No date: CHF (congestive heart failure) (HCC) No date: COPD  (chronic obstructive pulmonary disease) (HCC) 05/2013: Cryptogenic stroke (HCC)     Comment:   s/p acute L MCA territory infarct; b. 07/2013 s/p MDT               Linq ILR-->No AFib to documented to date (09/2016). No date: Current use of long term anticoagulation     Comment:  DAPT therapy (ASA + clopidogrel) No date: Depression No date: Diabetes mellitus without complication (HCC) No date: Essential (primary) hypertension No date: Gastritis No date: GERD (gastroesophageal reflux disease) No date: H/O insomnia 2015: History of cerebrovascular accident (CVA) with residual deficit     Comment:  facial drooping on one side of face is only residual               since stroke No date: IDA (iron deficiency anemia) No date: Inguinal hernia No date: Insomnia disorder No date: Ischemic cardiomyopathy     Comment:  a. 05/2010 LV gram: EF 45-50% @ time of inf MI; b. 09/2016              Echo: EF 50-55%, inf HK, Gr1 DD, mild MR. No  date: Mixed hyperlipidemia No date: Nephrolithiasis No date: Obesity No date: OSA (obstructive sleep apnea)     Comment:  no cpap No date: Osteoarthritis No date: Presence of permanent cardiac pacemaker 06/07/2010: STEMI (ST elevation myocardial infarction) (HCC)     Comment:  1 stent No date: Tobacco abuse No date: Urinary incontinence No date: Varicose veins No date: Vertigo  Past Surgical History: 10/22/2016: ANTERIOR CERVICAL DECOMP/DISCECTOMY FUSION; N/A     Comment:  Procedure: ANTERIOR CERVICAL DECOMPRESSION/DISCECTOMY               FUSION 2 LEVELS C3-4, C6-7;  Surgeon: Venetia Night,              MD;  Location: [facility] OR[facility];  Service: Neurosurgery;                Laterality: N/A; No date: BACK SURGERY 07/30/2021: BIV PACEMAKER INSERTION CRT-P; N/A     Comment:  Procedure: BIV PACEMAKER INSERTION CRT-P;  Surgeon:               Regan Lemming, MD;  Location: [facility];               Service: Cardiovascular;  Laterality: N/A; 06/07/2010:  CARDIAC CATHETERIZATION     Comment:  50 % ostial narrowing in 1st diagonal from LAD, 2nd               diagonal had 40% ostial narrowing; AV groove circumflex               was diminutive and occluded proximally; RCA totally               occluded in mid segment - stented with 3.5 x 24mm Promus               Element DES  - See Media tab 04/04/2013: CARDIAC CATHETERIZATION     Comment:  40% mLAD, 20% mRCA at the site of the previous stent.                LVEF was 55%. 02/26/2015: COLONOSCOPY WITH PROPOFOL; N/A     Comment:  Procedure: COLONOSCOPY WITH PROPOFOL;  Surgeon: Elnita Maxwell, MD;  Location: [facility] ENDOSCOPY;  Service:               Endoscopy;  Laterality: N/A; 03/16/2020: COLONOSCOPY WITH PROPOFOL; N/A     Comment:  Procedure: COLONOSCOPY WITH PROPOFOL;  Surgeon:               Regis Bill, MD;  Location: [facility] ENDOSCOPY;                Service: Endoscopy;  Laterality: N/A; No date: CORONARY ANGIOPLASTY 02/26/2015: ESOPHAGOGASTRODUODENOSCOPY     Comment:  Procedure: ESOPHAGOGASTRODUODENOSCOPY (EGD);  Surgeon:               Elnita Maxwell, MD;  Location: [facility] ENDOSCOPY;                Service: Endoscopy;; 03/16/2020: ESOPHAGOGASTRODUODENOSCOPY (EGD) WITH PROPOFOL; N/A     Comment:  Procedure: ESOPHAGOGASTRODUODENOSCOPY (EGD) WITH               PROPOFOL;  Surgeon: Regis Bill, MD;  Location:               [facility] ENDOSCOPY;  Service: Endoscopy;  Laterality: N/A; No date: EYE SURGERY  Comment:  cataract replaced No date: HERNIA REPAIR 08/16/2013: LOOP RECORDER IMPLANT     Comment:  Dr. Royann Shivers 06/07/2021: RIGHT/LEFT HEART CATH AND CORONARY ANGIOGRAPHY; Bilateral     Comment:  Procedure: RIGHT/LEFT HEART CATH AND CORONARY               ANGIOGRAPHY;  Surgeon: Iran Ouch, MD;  Location:               [facility] INVASIVE CV LAB;  Service: Cardiovascular;                Laterality: Bilateral; 05/23/2020: XI ROBOTIC ASSISTED INGUINAL HERNIA  REPAIR WITH MESH; Left     Comment:  Procedure: XI ROBOTIC ASSISTED INGUINAL HERNIA REPAIR               WITH MESH;  Surgeon: Carolan Shiver, MD;                Location: [facility] ORS;  Service: General;  Laterality: Left;     Reproductive/Obstetrics negative OB ROS                             Anesthesia Physical Anesthesia Plan  ASA: 3  Anesthesia Plan: General   Post-op Pain Management:    Induction: Intravenous  PONV Risk Score and Plan: Propofol infusion and TIVA  Airway Management Planned: Natural Airway and Nasal Cannula  Additional Equipment:   Intra-op Plan:   Post-operative Plan:   Informed Consent: I have reviewed the patients History and Physical, chart, labs and discussed the procedure including the risks, benefits and alternatives for the proposed anesthesia with the patient or authorized representative who has indicated his/her understanding and acceptance.     Dental Advisory Given  Plan Discussed with: Anesthesiologist, CRNA and Surgeon  Anesthesia Plan Comments: (Patient consented for risks of anesthesia including but not limited to:  - adverse reactions to medications - risk of airway placement if required - damage to eyes, teeth, lips or other oral mucosa - nerve damage due to positioning  - sore throat or hoarseness - Damage to heart, brain, nerves, lungs, other parts of body or loss of life  Patient voiced understanding.)       Anesthesia Quick Evaluation

## 2022-05-26 NOTE — H&P (Signed)
Outpatient short stay form Pre-procedure 05/26/2022  Regis Bill, MD  Primary Physician: Lauro Regulus, MD  Reason for visit:  Surveillance  History of present illness:    72 y/o gentleman with history of CAD, COPD, and CHF here for surveillance colonoscopy. Last colonoscopy in 2022 with fair prep and many polyps including one 10 mm TA. History of inguinal hernia repair. Last dose of plavix was 6 days ago. No family history of GI malignancies.    Current Facility-Administered Medications:    0.9 %  sodium chloride infusion, , Intravenous, Continuous, Dione Mccombie, Rossie Muskrat, MD  Medications Prior to Admission  Medication Sig Dispense Refill Last Dose   carvedilol (COREG) 6.25 MG tablet Take 1 tablet (6.25 mg total) by mouth 2 (two) times daily. 60 tablet 3 05/25/2022   COMBIVENT RESPIMAT 20-100 MCG/ACT AERS respimat Inhale 1 puff into the lungs every 6 (six) hours as needed for wheezing.   05/25/2022   ENTRESTO 97-103 MG TAKE 1 TABLET BY MOUTH TWICE DAILY 60 tablet 2 05/25/2022   ferrous sulfate 325 (65 FE) MG EC tablet Take 1 tablet (325 mg total) by mouth in the morning and at bedtime. 60 tablet 11 Past Month   pantoprazole (PROTONIX) 40 MG tablet Take 40 mg by mouth daily.   05/25/2022   PARoxetine (PAXIL) 40 MG tablet Take 40 mg by mouth daily.    05/25/2022   pravastatin (PRAVACHOL) 80 MG tablet TAKE 1 TABLET(80 MG) BY MOUTH EVERY EVENING 90 tablet 0 05/25/2022   ACCU-CHEK GUIDE test strip USE 1 STRIP TO TEST ONCE DAILY      acetaminophen (TYLENOL) 325 MG tablet Take 650 mg by mouth every 6 (six) hours as needed for moderate pain.      aspirin 81 MG chewable tablet Chew 1 tablet (81 mg total) by mouth daily.      clopidogrel (PLAVIX) 75 MG tablet TAKE 1 TABLET(75 MG) BY MOUTH DAILY 90 tablet 3 05/20/2022   empagliflozin (JARDIANCE) 10 MG TABS tablet Take 1 tablet (10 mg total) by mouth daily before breakfast. (Patient not taking: Reported on 05/19/2022) 90 tablet 3 Not Taking    furosemide (LASIX) 20 MG tablet Take 1 tablet (20 mg total) by mouth daily. 90 tablet 3    NICOTROL NS 10 MG/ML SOLN Place 0.1 mLs (1 mg total) into both nostrils as needed.  0    spironolactone (ALDACTONE) 25 MG tablet Take 0.5 tablets (12.5 mg total) by mouth daily. 45 tablet 3    traZODone (DESYREL) 100 MG tablet Take 50 mg by mouth at bedtime as needed for sleep.         Allergies  Allergen Reactions   Penicillins Anaphylaxis, Hives and Nausea And Vomiting    As a child Has patient had a PCN reaction causing immediate rash, facial/tongue/throat swelling, SOB or lightheadedness with hypotension: No Has patient had a PCN reaction causing severe rash involving mucus membranes or skin necrosis: No Has patient had a PCN reaction that required hospitalization No Has patient had a PCN reaction occurring within the last 10 years: No If all of the above answers are "NO", then may proceed with Cephalosporin use.      Past Medical History:  Diagnosis Date   Adrenal adenoma, left    Anxiety    Aortic atherosclerosis (HCC)    Asthma without status asthmaticus    Atherosclerotic cerebrovascular disease    Overview:  Small and facial weakness with minimal weakness    CAD (coronary artery  disease)    a. 05/2010 Inf STEMI/PCI: RCA 153m (3.5x24 Promus DES), LCX small, 100, D1 50ost, D2 40ost, EF 45-50%; b. 03/2013 Cath ([facility]): 40% mLAD, 20% stenosis mRCA (site of the previous stent); LVEF 55%.   Cervical disc disease    a. s/p C3-4 fusion 2003.   Cervical myelopathy (HCC)    a. 09/2016 Admit w/ left sided wkns->found to have progressive cervical disc dzs above and below prior fusioin site; b. 10/2016 s/p ant cervical diskectomy.   CHF (congestive heart failure) (HCC)    COPD (chronic obstructive pulmonary disease) (HCC)    Cryptogenic stroke (HCC) 05/2013    s/p acute L MCA territory infarct; b. 07/2013 s/p MDT Linq ILR-->No AFib to documented to date (09/2016).   Current use of long term  anticoagulation    DAPT therapy (ASA + clopidogrel)   Depression    Diabetes mellitus without complication (HCC)    Essential (primary) hypertension    Gastritis    GERD (gastroesophageal reflux disease)    H/O insomnia    History of cerebrovascular accident (CVA) with residual deficit 2015   facial drooping on one side of face is only residual since stroke   IDA (iron deficiency anemia)    Inguinal hernia    Insomnia disorder    Ischemic cardiomyopathy    a. 05/2010 LV gram: EF 45-50% @ time of inf MI; b. 09/2016 Echo: EF 50-55%, inf HK, Gr1 DD, mild MR.   Mixed hyperlipidemia    Nephrolithiasis    Obesity    OSA (obstructive sleep apnea)    no cpap   Osteoarthritis    Presence of permanent cardiac pacemaker    STEMI (ST elevation myocardial infarction) (HCC) 06/07/2010   1 stent   Tobacco abuse    Urinary incontinence    Varicose veins    Vertigo     Review of systems:  Otherwise negative.    Physical Exam  Gen: Alert, oriented. Appears stated age.  HEENT: PERRLA. Lungs: No respiratory distress CV: RRR Abd: soft, benign, no masses Ext: No edema    Planned procedures: Proceed with colonoscopy. The patient understands the nature of the planned procedure, indications, risks, alternatives and potential complications including but not limited to bleeding, infection, perforation, damage to internal organs and possible oversedation/side effects from anesthesia. The patient agrees and gives consent to proceed.  Please refer to procedure notes for findings, recommendations and patient disposition/instructions.     Regis Bill, MD [facility]

## 2022-05-26 NOTE — Transfer of Care (Signed)
Immediate Anesthesia Transfer of Care Note  Patient: Louis Gutierrez  Procedure(s) Performed: COLONOSCOPY WITH PROPOFOL  Patient Location: Endoscopy Unit  Anesthesia Type:General  Level of Consciousness: drowsy and patient cooperative  Airway & Oxygen Therapy: Patient Spontanous Breathing and Patient connected to face mask oxygen  Post-op Assessment: Report given to RN and Post -op Vital signs reviewed and stable  Post vital signs: Reviewed and stable  Last Vitals:  Vitals Value Taken Time  BP 141/64 05/26/22 0939  Temp 35.9 C 05/26/22 0929  Pulse 90 05/26/22 0941  Resp 22 05/26/22 0941  SpO2 100 % 05/26/22 0941  Vitals shown include unvalidated device data.  Last Pain:  Vitals:   05/26/22 0939  TempSrc:   PainSc: 0-No pain         Complications: No notable events documented.

## 2022-05-26 NOTE — Interval H&P Note (Signed)
History and Physical Interval Note:  05/26/2022 8:55 AM  Louis Gutierrez  has presented today for surgery, with the diagnosis of h/o colon polyps.  The various methods of treatment have been discussed with the patient and family. After consideration of risks, benefits and other options for treatment, the patient has consented to  Procedure(s): COLONOSCOPY WITH PROPOFOL (N/A) as a surgical intervention.  The patient's history has been reviewed, patient examined, no change in status, stable for surgery.  I have reviewed the patient's chart and labs.  Questions were answered to the patient's satisfaction.     Regis Bill  Ok to proceed with colonoscopy

## 2022-05-26 NOTE — Op Note (Signed)
[facility] Patient Name: Louis Gutierrez Procedure Date: 05/26/2022 8:47 AM MRN: [id number] Account #: [id number] Date of Birth: 08/06/1950 Admit Type: Outpatient Age: 72 Room: [facility] ROOM 3 Gender: Male Note Status: Finalized Instrument Name: Prentice Docker [id number] Procedure:             Colonoscopy Indications:           Surveillance: History of adenomatous polyps,                         inadequate prep on last exam (<49yr) Providers:             Eather Colas MD, MD Referring MD:          Marya Amsler. Dareen Piano MD, MD (Referring MD) Medicines:             Monitored Anesthesia Care Complications:         No immediate complications. Estimated blood loss:                         Minimal. Procedure:             Pre-Anesthesia Assessment:                        - Prior to the procedure, a History and Physical was                         performed, and patient medications and allergies were                         reviewed. The patient is competent. The risks and                         benefits of the procedure and the sedation options and                         risks were discussed with the patient. All questions                         were answered and informed consent was obtained.                         Patient identification and proposed procedure were                         verified by the physician, the nurse, the                         anesthesiologist, the anesthetist and the technician                         in the endoscopy suite. Mental Status Examination:                         alert and oriented. Airway Examination: normal                         oropharyngeal airway and neck mobility. Respiratory  Examination: clear to auscultation. CV Examination:                         normal. Prophylactic Antibiotics: The patient does not                         require prophylactic antibiotics. Prior                          Anticoagulants: The patient has taken no anticoagulant                         or antiplatelet agents. ASA Grade Assessment: III - A                         patient with severe systemic disease. After reviewing                         the risks and benefits, the patient was deemed in                         satisfactory condition to undergo the procedure. The                         anesthesia plan was to use monitored anesthesia care                         (MAC). Immediately prior to administration of                         medications, the patient was re-assessed for adequacy                         to receive sedatives. The heart rate, respiratory                         rate, oxygen saturations, blood pressure, adequacy of                         pulmonary ventilation, and response to care were                         monitored throughout the procedure. The physical                         status of the patient was re-assessed after the                         procedure.                        After obtaining informed consent, the colonoscope was                         passed under direct vision. Throughout the procedure,                         the patient's blood pressure, pulse, and oxygen  saturations were monitored continuously. The                         Colonoscope was introduced through the anus and                         advanced to the the cecum, identified by appendiceal                         orifice and ileocecal valve. The colonoscopy was                         somewhat difficult due to significant looping.                         Successful completion of the procedure was aided by                         applying abdominal pressure. The patient tolerated the                         procedure well. The quality of the bowel preparation                         was adequate to identify polyps. The ileocecal valve,                          appendiceal orifice, and rectum were photographed. Findings:      The perianal and digital rectal examinations were normal.      A tattoo was seen in the ascending colon. The tattoo site appeared       normal.      A 2 mm polyp was found in the sigmoid colon. The polyp was sessile. The       polyp was removed with a jumbo cold forceps. Resection and retrieval       were complete. Estimated blood loss was minimal.      Internal hemorrhoids were found during retroflexion. The hemorrhoids       were Grade III (internal hemorrhoids that prolapse but require manual       reduction).      The exam was otherwise without abnormality on direct and retroflexion       views. Impression:            - A tattoo was seen in the ascending colon. The tattoo                         site appeared normal.                        - One 2 mm polyp in the sigmoid colon, removed with a                         jumbo cold forceps. Resected and retrieved.                        - Internal hemorrhoids.                        - The examination  was otherwise normal on direct and                         retroflexion views. Recommendation:        - Discharge patient to home.                        - Resume previous diet.                        - Continue present medications.                        - Await pathology results.                        - Repeat colonoscopy is not recommended due to current                         age (66 years or older) for surveillance.                        - Return to referring physician as previously                         scheduled. Procedure Code(s):     --- Professional ---                        [id number], Colonoscopy, flexible; with biopsy, single or                         multiple Diagnosis Code(s):     --- Professional ---                        Z86.010, Personal history of colonic polyps                        K64.2, Third degree hemorrhoids                        D12.5,  Benign neoplasm of sigmoid colon CPT copyright 2022 American [employer]. All rights reserved. The codes documented in this report are preliminary and upon coder review may  be revised to meet current compliance requirements. Eather Colas MD, MD 05/26/2022 9:33:04 AM Number of Addenda: 0 Note Initiated On: 05/26/2022 8:47 AM Scope Withdrawal Time: 0 hours 9 minutes 44 seconds  Total Procedure Duration: 0 hours 24 minutes 52 seconds  Estimated Blood Loss:  Estimated blood loss was minimal.      [facility]

## 2022-05-26 NOTE — Anesthesia Postprocedure Evaluation (Signed)
Anesthesia Post Note  Patient: Louis Gutierrez  Procedure(s) Performed: COLONOSCOPY WITH PROPOFOL  Patient location during evaluation: Endoscopy Anesthesia Type: General Level of consciousness: awake and alert Pain management: pain level controlled Vital Signs Assessment: post-procedure vital signs reviewed and stable Respiratory status: spontaneous breathing, nonlabored ventilation, respiratory function stable and patient connected to nasal cannula oxygen Cardiovascular status: blood pressure returned to baseline and stable Postop Assessment: no apparent nausea or vomiting Anesthetic complications: no   No notable events documented.   Last Vitals:  Vitals:   05/26/22 0939 05/26/22 0949  BP: (!) 141/64 (!) 154/76  Pulse: 91 88  Resp: 19 20  Temp:    SpO2: 100% 97%    Last Pain:  Vitals:   05/26/22 0949  TempSrc:   PainSc: 0-No pain                 Cleda Mccreedy Denny Lave

## 2022-05-27 ENCOUNTER — Encounter: Payer: Self-pay | Admitting: Gastroenterology

## 2022-05-27 LAB — SURGICAL PATHOLOGY

## 2022-05-30 MED ORDER — CLOPIDOGREL BISULFATE 75 MG PO TABS: ORAL_TABLET | ORAL | 3 refills | Status: DC

## 2022-05-30 MED ORDER — EMPAGLIFLOZIN 10 MG PO TABS: 10.0000 mg | ORAL_TABLET | Freq: Every day | ORAL | 3 refills | Status: DC

## 2022-05-30 NOTE — Addendum Note (Signed)
Addended by: Scherrie Bateman E on: 05/30/2022 11:39 AM   Modules accepted: Orders

## 2022-05-30 NOTE — Telephone Encounter (Signed)
The clopidogrel was added after his stroke in January. Defer to Neuro as to duration of treatment, but OK to refill.

## 2022-05-30 NOTE — Telephone Encounter (Signed)
Ok to fill clopidogrel. Wonder if he still needs ASA and DAPT. Will defer to Dr. Salena Saner.

## 2022-05-30 NOTE — Telephone Encounter (Signed)
Left detailed message refill sent to [facility] for Plavix 75 mg .Zack Seal

## 2022-05-30 NOTE — Addendum Note (Signed)
Addended by: Candie Chroman on: 05/30/2022 07:47 AM   Modules accepted: Orders

## 2022-06-05 NOTE — Progress Notes (Signed)
Remote pacemaker transmission.   

## 2022-06-26 DIAGNOSIS — J441 Chronic obstructive pulmonary disease with (acute) exacerbation: Secondary | ICD-10-CM | POA: Diagnosis not present

## 2022-06-26 DIAGNOSIS — Z03818 Encounter for observation for suspected exposure to other biological agents ruled out: Secondary | ICD-10-CM | POA: Diagnosis not present

## 2022-07-12 ENCOUNTER — Other Ambulatory Visit: Payer: Self-pay | Admitting: Cardiovascular Disease

## 2022-07-14 ENCOUNTER — Telehealth: Payer: Self-pay | Admitting: Cardiovascular Disease

## 2022-07-14 NOTE — Telephone Encounter (Signed)
Follow Up:      Patient called to see if refill have been called in. He says he does not have any left.       *STAT* If patient is at the pharmacy, call can be transferred to refill team.   1. Which medications need to be refilled? (please list name of each medication and dose if known) Entresto  2. Which pharmacy/location (including street and city if local pharmacy) is medication to be sent to? [facility] [address] [place], [place]  3. Do they need a 30 day or 90 day supply? 90 days-and refills- please call today, completely out

## 2022-07-14 NOTE — Telephone Encounter (Signed)
*  STAT* If patient is at the pharmacy, call can be transferred to refill team.   1. Which medications need to be refilled? (please list name of each medication and dose if known) ENTRESTO 97-103 MG   2. Which pharmacy/location (including street and city if local pharmacy) is medication to be sent to?  [id number] - [place], [place] - [address] AT [facility] OF [address]    3. Do they need a 30 day or 90 day supply? 90   Patient is out of medication

## 2022-07-15 NOTE — Telephone Encounter (Signed)
Refill has already been taken care of.

## 2022-08-01 ENCOUNTER — Ambulatory Visit (INDEPENDENT_AMBULATORY_CARE_PROVIDER_SITE_OTHER): Payer: Medicare HMO

## 2022-08-01 DIAGNOSIS — I5042 Chronic combined systolic (congestive) and diastolic (congestive) heart failure: Secondary | ICD-10-CM

## 2022-08-01 DIAGNOSIS — I2102 ST elevation (STEMI) myocardial infarction involving left anterior descending coronary artery: Secondary | ICD-10-CM

## 2022-08-01 LAB — CUP PACEART REMOTE DEVICE CHECK
Battery Remaining Longevity: 78 mo
Battery Remaining Percentage: 88 %
Battery Voltage: 2.99 V
Brady Statistic AP VP Percent: 9.4 %
Brady Statistic AP VS Percent: 1 %
Brady Statistic AS VP Percent: 90 %
Brady Statistic AS VS Percent: 1 %
Brady Statistic RA Percent Paced: 8.8 %
Date Time Interrogation Session: 20240712020008
Implantable Lead Connection Status: 753985
Implantable Lead Connection Status: 753985
Implantable Lead Connection Status: 753985
Implantable Lead Implant Date: 20230711
Implantable Lead Implant Date: 20230711
Implantable Lead Implant Date: 20230711
Implantable Lead Location: 753858
Implantable Lead Location: 753859
Implantable Lead Location: 753860
Implantable Pulse Generator Implant Date: 20230711
Lead Channel Impedance Value: 1075 Ohm
Lead Channel Impedance Value: 430 Ohm
Lead Channel Impedance Value: 560 Ohm
Lead Channel Pacing Threshold Amplitude: 0.5 V
Lead Channel Pacing Threshold Amplitude: 0.5 V
Lead Channel Pacing Threshold Amplitude: 1.25 V
Lead Channel Pacing Threshold Pulse Width: 0.5 ms
Lead Channel Pacing Threshold Pulse Width: 0.5 ms
Lead Channel Pacing Threshold Pulse Width: 1 ms
Lead Channel Sensing Intrinsic Amplitude: 12 mV
Lead Channel Sensing Intrinsic Amplitude: 5 mV
Lead Channel Setting Pacing Amplitude: 2 V
Lead Channel Setting Pacing Amplitude: 2.5 V
Lead Channel Setting Pacing Amplitude: 2.5 V
Lead Channel Setting Pacing Pulse Width: 0.5 ms
Lead Channel Setting Pacing Pulse Width: 0.8 ms
Lead Channel Setting Sensing Sensitivity: 2 mV
Pulse Gen Model: 3562
Pulse Gen Serial Number: 8088605

## 2022-08-04 ENCOUNTER — Ambulatory Visit: Payer: Medicare HMO

## 2022-08-05 ENCOUNTER — Other Ambulatory Visit (HOSPITAL_BASED_OUTPATIENT_CLINIC_OR_DEPARTMENT_OTHER): Payer: Self-pay | Admitting: Family

## 2022-08-15 ENCOUNTER — Ambulatory Visit
Admission: RE | Admit: 2022-08-15 | Discharge: 2022-08-15 | Disposition: A | Discharge status: Home / Self Care | Payer: Medicare HMO | Source: Ambulatory Visit | Attending: Urology | Admitting: Urology

## 2022-08-15 DIAGNOSIS — N281 Cyst of kidney, acquired: Secondary | ICD-10-CM | POA: Diagnosis not present

## 2022-08-15 DIAGNOSIS — N2889 Other specified disorders of kidney and ureter: Secondary | ICD-10-CM | POA: Diagnosis not present

## 2022-08-18 NOTE — Progress Notes (Signed)
Remote pacemaker transmission.   

## 2022-08-27 ENCOUNTER — Encounter: Payer: Self-pay | Admitting: Urology

## 2022-08-27 ENCOUNTER — Ambulatory Visit: Payer: Medicare HMO | Admitting: Urology

## 2022-08-27 VITALS — BP 137/83 | HR 76 | Ht 69.0 in | Wt 181.0 lb

## 2022-08-27 DIAGNOSIS — N2889 Other specified disorders of kidney and ureter: Secondary | ICD-10-CM | POA: Diagnosis not present

## 2022-08-27 DIAGNOSIS — E278 Other specified disorders of adrenal gland: Secondary | ICD-10-CM | POA: Diagnosis not present

## 2022-08-27 NOTE — Patient Instructions (Signed)
Please give [place] a call to schedule

## 2022-08-27 NOTE — Progress Notes (Signed)
   08/27/2022 10:43 AM   Louis Gutierrez Jul 19, 1950 [id number]  Reason for visit: Follow up renal mass, adrenal mass, ED  HPI: Comorbid 72 year old male who was previously followed by Dr. Berneice Heinrich in September 2017 for a stable 3.6 cm left adrenal mass, and a small left 1.5 mm left upper pole solid renal mass since at least 2015.  We opted for surveillance at that time, and I also placed multiple referrals to endocrinology for metabolic evaluation of the adrenal mass, but he has always canceled those appointments.  I personally viewed and interpreted the most recent renal ultrasound dated 08/15/2022 that shows a stable 1.6 cm left upper pole renal mass that is minimally changed over the last 8+ years.  Using shared decision making he opted to continue active surveillance which is very reasonable.  I also personally viewed and interpreted the CT chest from February 2023 that shows a stable 4 cm left adrenal adenoma.  In terms of his ED, cannot take PDE 5 inhibitors with nitrate use, not interested in other treatments at this time.  RTC 32mo renal ultrasound prior   Sondra Come, MD  [facility] [address] [place], [place] [address] [phone]

## 2022-10-10 ENCOUNTER — Other Ambulatory Visit: Payer: Self-pay | Admitting: Cardiovascular Disease

## 2022-10-31 ENCOUNTER — Ambulatory Visit (INDEPENDENT_AMBULATORY_CARE_PROVIDER_SITE_OTHER): Payer: Medicare HMO

## 2022-10-31 DIAGNOSIS — I5022 Chronic systolic (congestive) heart failure: Secondary | ICD-10-CM

## 2022-11-04 DIAGNOSIS — Z03818 Encounter for observation for suspected exposure to other biological agents ruled out: Secondary | ICD-10-CM | POA: Diagnosis not present

## 2022-11-04 DIAGNOSIS — J441 Chronic obstructive pulmonary disease with (acute) exacerbation: Secondary | ICD-10-CM | POA: Diagnosis not present

## 2022-11-04 LAB — CUP PACEART REMOTE DEVICE CHECK
Battery Remaining Longevity: 72 mo
Battery Remaining Percentage: 85 %
Battery Voltage: 2.99 V
Brady Statistic AP VP Percent: 9.2 %
Brady Statistic AP VS Percent: 1 %
Brady Statistic AS VP Percent: 90 %
Brady Statistic AS VS Percent: 1 %
Brady Statistic RA Percent Paced: 8.4 %
Date Time Interrogation Session: 20241011020008
Implantable Lead Connection Status: 753985
Implantable Lead Connection Status: 753985
Implantable Lead Connection Status: 753985
Implantable Lead Implant Date: 20230711
Implantable Lead Implant Date: 20230711
Implantable Lead Implant Date: 20230711
Implantable Lead Location: 753858
Implantable Lead Location: 753859
Implantable Lead Location: 753860
Implantable Pulse Generator Implant Date: 20230711
Lead Channel Impedance Value: 430 Ohm
Lead Channel Impedance Value: 490 Ohm
Lead Channel Impedance Value: 960 Ohm
Lead Channel Pacing Threshold Amplitude: 0.5 V
Lead Channel Pacing Threshold Amplitude: 0.5 V
Lead Channel Pacing Threshold Amplitude: 1.25 V
Lead Channel Pacing Threshold Pulse Width: 0.5 ms
Lead Channel Pacing Threshold Pulse Width: 0.5 ms
Lead Channel Pacing Threshold Pulse Width: 1 ms
Lead Channel Sensing Intrinsic Amplitude: 12 mV
Lead Channel Sensing Intrinsic Amplitude: 5 mV
Lead Channel Setting Pacing Amplitude: 2 V
Lead Channel Setting Pacing Amplitude: 2.5 V
Lead Channel Setting Pacing Amplitude: 2.5 V
Lead Channel Setting Pacing Pulse Width: 0.5 ms
Lead Channel Setting Pacing Pulse Width: 0.8 ms
Lead Channel Setting Sensing Sensitivity: 2 mV
Pulse Gen Model: 3562
Pulse Gen Serial Number: 8088605

## 2022-11-10 NOTE — Progress Notes (Signed)
Remote pacemaker transmission.   

## 2022-11-28 DIAGNOSIS — F325 Major depressive disorder, single episode, in full remission: Secondary | ICD-10-CM | POA: Diagnosis not present

## 2022-11-28 DIAGNOSIS — I110 Hypertensive heart disease with heart failure: Secondary | ICD-10-CM | POA: Diagnosis not present

## 2022-11-28 DIAGNOSIS — Z125 Encounter for screening for malignant neoplasm of prostate: Secondary | ICD-10-CM | POA: Diagnosis not present

## 2022-11-28 DIAGNOSIS — I5022 Chronic systolic (congestive) heart failure: Secondary | ICD-10-CM | POA: Diagnosis not present

## 2022-11-28 DIAGNOSIS — Z0000 Encounter for general adult medical examination without abnormal findings: Secondary | ICD-10-CM | POA: Diagnosis not present

## 2022-11-28 DIAGNOSIS — E119 Type 2 diabetes mellitus without complications: Secondary | ICD-10-CM | POA: Diagnosis not present

## 2022-11-28 DIAGNOSIS — I25119 Atherosclerotic heart disease of native coronary artery with unspecified angina pectoris: Secondary | ICD-10-CM | POA: Diagnosis not present

## 2022-11-28 DIAGNOSIS — J449 Chronic obstructive pulmonary disease, unspecified: Secondary | ICD-10-CM | POA: Diagnosis not present

## 2022-11-28 DIAGNOSIS — Z1331 Encounter for screening for depression: Secondary | ICD-10-CM | POA: Diagnosis not present

## 2022-12-24 DIAGNOSIS — M1712 Unilateral primary osteoarthritis, left knee: Secondary | ICD-10-CM | POA: Diagnosis not present

## 2022-12-24 DIAGNOSIS — M25562 Pain in left knee: Secondary | ICD-10-CM | POA: Diagnosis not present

## 2022-12-24 DIAGNOSIS — M25462 Effusion, left knee: Secondary | ICD-10-CM | POA: Diagnosis not present

## 2022-12-24 DIAGNOSIS — M1711 Unilateral primary osteoarthritis, right knee: Secondary | ICD-10-CM | POA: Diagnosis not present

## 2023-01-07 DIAGNOSIS — M25562 Pain in left knee: Secondary | ICD-10-CM | POA: Diagnosis not present

## 2023-01-07 DIAGNOSIS — M1712 Unilateral primary osteoarthritis, left knee: Secondary | ICD-10-CM | POA: Diagnosis not present

## 2023-01-08 DIAGNOSIS — M1712 Unilateral primary osteoarthritis, left knee: Secondary | ICD-10-CM | POA: Diagnosis not present

## 2023-01-20 ENCOUNTER — Other Ambulatory Visit: Payer: Self-pay | Admitting: Family

## 2023-01-23 ENCOUNTER — Encounter: Payer: Self-pay | Admitting: Cardiovascular Disease

## 2023-01-23 DIAGNOSIS — F325 Major depressive disorder, single episode, in full remission: Secondary | ICD-10-CM | POA: Diagnosis not present

## 2023-01-23 DIAGNOSIS — J449 Chronic obstructive pulmonary disease, unspecified: Secondary | ICD-10-CM | POA: Diagnosis not present

## 2023-01-23 DIAGNOSIS — I25119 Atherosclerotic heart disease of native coronary artery with unspecified angina pectoris: Secondary | ICD-10-CM | POA: Diagnosis not present

## 2023-01-23 DIAGNOSIS — I5022 Chronic systolic (congestive) heart failure: Secondary | ICD-10-CM | POA: Diagnosis not present

## 2023-01-23 DIAGNOSIS — I10 Essential (primary) hypertension: Secondary | ICD-10-CM | POA: Diagnosis not present

## 2023-01-23 DIAGNOSIS — E119 Type 2 diabetes mellitus without complications: Secondary | ICD-10-CM | POA: Diagnosis not present

## 2023-01-26 ENCOUNTER — Telehealth: Payer: Self-pay

## 2023-01-26 NOTE — Telephone Encounter (Signed)
 This Carvedilol was not prescribed by Dr. Royann Shivers Would Dr. Royann Shivers like to refill? Please advise.

## 2023-01-26 NOTE — Telephone Encounter (Signed)
 This Carvedilol was not prescribed by Dr. Elberta Fortis. Would Dr. Elberta Fortis like to refill? Please advise.

## 2023-01-27 MED ORDER — CARVEDILOL 6.25 MG PO TABS: 6.2500 mg | ORAL_TABLET | Freq: Two times a day (BID) | ORAL | 0 refills | Status: DC

## 2023-01-27 NOTE — Telephone Encounter (Signed)
 Chrystie Nose, MD  You1 hour ago (4:22 PM)    Ok to renew carvedilol for 30 days.  He has follow-up with Dr. Salena Saner next week.  Dr Rexene Edison

## 2023-01-28 DIAGNOSIS — R0902 Hypoxemia: Secondary | ICD-10-CM | POA: Diagnosis not present

## 2023-01-28 DIAGNOSIS — F17210 Nicotine dependence, cigarettes, uncomplicated: Secondary | ICD-10-CM | POA: Diagnosis not present

## 2023-01-28 DIAGNOSIS — J449 Chronic obstructive pulmonary disease, unspecified: Secondary | ICD-10-CM | POA: Diagnosis not present

## 2023-01-28 DIAGNOSIS — G4733 Obstructive sleep apnea (adult) (pediatric): Secondary | ICD-10-CM | POA: Diagnosis not present

## 2023-01-28 DIAGNOSIS — F17200 Nicotine dependence, unspecified, uncomplicated: Secondary | ICD-10-CM | POA: Diagnosis not present

## 2023-01-30 ENCOUNTER — Ambulatory Visit: Payer: Medicare HMO

## 2023-01-30 DIAGNOSIS — I447 Left bundle-branch block, unspecified: Secondary | ICD-10-CM

## 2023-01-30 LAB — CUP PACEART REMOTE DEVICE CHECK
Battery Remaining Longevity: 73 mo
Battery Remaining Percentage: 82 %
Battery Voltage: 2.99 V
Brady Statistic AP VP Percent: 8.6 %
Brady Statistic AP VS Percent: 1 %
Brady Statistic AS VP Percent: 90 %
Brady Statistic AS VS Percent: 1 %
Brady Statistic RA Percent Paced: 7.6 %
Date Time Interrogation Session: 20250110020028
Implantable Lead Connection Status: 753985
Implantable Lead Connection Status: 753985
Implantable Lead Connection Status: 753985
Implantable Lead Implant Date: 20230711
Implantable Lead Implant Date: 20230711
Implantable Lead Implant Date: 20230711
Implantable Lead Location: 753858
Implantable Lead Location: 753859
Implantable Lead Location: 753860
Implantable Pulse Generator Implant Date: 20230711
Lead Channel Impedance Value: 1175 Ohm
Lead Channel Impedance Value: 430 Ohm
Lead Channel Impedance Value: 560 Ohm
Lead Channel Pacing Threshold Amplitude: 0.5 V
Lead Channel Pacing Threshold Amplitude: 0.5 V
Lead Channel Pacing Threshold Amplitude: 1.25 V
Lead Channel Pacing Threshold Pulse Width: 0.5 ms
Lead Channel Pacing Threshold Pulse Width: 0.5 ms
Lead Channel Pacing Threshold Pulse Width: 1 ms
Lead Channel Sensing Intrinsic Amplitude: 12 mV
Lead Channel Sensing Intrinsic Amplitude: 5 mV
Lead Channel Setting Pacing Amplitude: 2 V
Lead Channel Setting Pacing Amplitude: 2.5 V
Lead Channel Setting Pacing Amplitude: 2.5 V
Lead Channel Setting Pacing Pulse Width: 0.5 ms
Lead Channel Setting Pacing Pulse Width: 0.8 ms
Lead Channel Setting Sensing Sensitivity: 2 mV
Pulse Gen Model: 3562
Pulse Gen Serial Number: 8088605

## 2023-02-03 ENCOUNTER — Encounter: Payer: Self-pay | Admitting: Cardiovascular Disease

## 2023-02-03 ENCOUNTER — Encounter: Payer: Medicare HMO | Admitting: Cardiology

## 2023-02-03 ENCOUNTER — Ambulatory Visit: Payer: Medicare HMO | Attending: Cardiovascular Disease | Admitting: Cardiovascular Disease

## 2023-02-03 VITALS — BP 158/94 | HR 62 | Ht 69.0 in | Wt 179.2 lb

## 2023-02-03 DIAGNOSIS — I2510 Atherosclerotic heart disease of native coronary artery without angina pectoris: Secondary | ICD-10-CM

## 2023-02-03 DIAGNOSIS — R7303 Prediabetes: Secondary | ICD-10-CM

## 2023-02-03 DIAGNOSIS — I10 Essential (primary) hypertension: Secondary | ICD-10-CM

## 2023-02-03 DIAGNOSIS — E785 Hyperlipidemia, unspecified: Secondary | ICD-10-CM | POA: Diagnosis not present

## 2023-02-03 DIAGNOSIS — Z8673 Personal history of transient ischemic attack (TIA), and cerebral infarction without residual deficits: Secondary | ICD-10-CM | POA: Diagnosis not present

## 2023-02-03 DIAGNOSIS — J449 Chronic obstructive pulmonary disease, unspecified: Secondary | ICD-10-CM | POA: Diagnosis not present

## 2023-02-03 DIAGNOSIS — Z950 Presence of cardiac pacemaker: Secondary | ICD-10-CM

## 2023-02-03 DIAGNOSIS — I5042 Chronic combined systolic (congestive) and diastolic (congestive) heart failure: Secondary | ICD-10-CM

## 2023-02-03 DIAGNOSIS — I5043 Acute on chronic combined systolic (congestive) and diastolic (congestive) heart failure: Secondary | ICD-10-CM | POA: Diagnosis not present

## 2023-02-03 MED ORDER — SPIRONOLACTONE 25 MG PO TABS: 25.0000 mg | ORAL_TABLET | Freq: Every day | ORAL | 3 refills | Status: DC

## 2023-02-03 MED ORDER — FUROSEMIDE 20 MG PO TABS: 20.0000 mg | ORAL_TABLET | Freq: Every day | ORAL | 1 refills | Status: DC | PRN

## 2023-02-03 NOTE — Progress Notes (Signed)
 Cardiology Office Note    Date:  02/05/2023   ID:  Louis Gutierrez, DOB [birth year], MRN [id number]  PCP:  Lenon Layman ORN, MD  Cardiologist:   Jerel Balding, MD   Chief Complaint  Patient presents with   Congestive Heart Failure    History of Present Illness:  Louis Gutierrez is a 73 y.o. male with coronary artery disease, congestive heart failure, LBBB s/p CRT-P (Abbott Quadra Allure MP, 07/30/21, [place]), COPD and a history of cryptogenic stroke.  Patient presented with inferior wall STEMI in 2012 when he underwent placement of a drug-eluting stent to the RCA for total occlusion (3.5 x 24 mm Promus), and was also found to have moderate stenosis in the LAD, first diagonal artery and subclavian artery as well as chronic total occlusion of the small nondominant left circumflex coronary artery.  His EF has been mildly decreased at about 50% ever since.  He had an unexplained stroke with right hemiparesis and expressive aphasia in 2015.  Loop recorder did not show evidence of atrial fibrillation throughout its entire 3-year operation.  He was admitted to [facility] in early March 2023 with severe shortness of breath and was treated for both heart failure and COPD exacerbation (steroids, bronchodilators, furosemide ).  Echocardiogram performed that time showed LVEF 35-40% with grade 1 diastolic dysfunction.  BNP 197  February 28, decreased to 60 on March 31.  Cardiac catheterization 07/25/2021 showed normal right and left heart filling pressures.  LVEF was estimated to be severely reduced with an ejection fraction of 30-35%.  The previously placed stent in the right coronary artery was widely patent and he did not have any other meaningful stenoses.  Most severe lesion was a 40% proximal-mid left circumflex stenosis.  He continued to have multiple difficulties in 2024.  He had 2 serious injuries due to mechanical falls.  He fell on his face in September and fell under his car  and injured his left knee in December.  His breathing is better.  He has NYHA functional class I-2.  He is no longer taking furosemide  although he has it available as needed.  He reports that starting treatment with Trelegy made a big difference.  He has lost some weight.  He has reduced his smoking to 6 cigarettes a day and continues to try to wean off completely.  He has not had any chest pain.  He denies orthopnea, PND, lower extremity edema and claudication.  He has not had any new neurological complaints or any bleeding problems.  He reports that his blood pressure is consistently elevated around 150/90.  He denies orthopnea, PND, palpitations or syncope.  Echo 11/22/2021 performed a few months after implantation of his biventricular pacemaker showed improved LVEF 40-45%, with inferior wall akinesis and grade 1 diastolic dysfunction.  Interrogation of his Abbott Quadra Allure MP MRI conditional CRT-P device performed today shows normal device function.  Estimated generator longevity is 6 years.  All lead parameters are excellent (capture thresholds were checked on all 3 leads today).  He has 98% biventricular pacing efficiency.  He has not had any episodes of high ventricular rate or atrial fibrillation.  He had a single 10-second episode of paroxysmal atrial tachycardia since his last office check.  His thoracic impedance did show a temporary decrease in December lasting about a week but is now back to normal.  There is no evidence of hypovolemia on interrogation today.  The paced QRS complex is relatively narrow at 132 ms  with a distinct initial R wave in leads V1-V2.  QTc 462 ms.  He is on maximum dose Entresto , highest tolerated dose of carvedilol , a minimal dose of spironolactone  and Farxiga  10 mg daily.  He continues to take dual antiplatelet therapy without bleeding problems.  Metabolic parameters are good with a hemoglobin A1c of 5.9 % and LDL of 57, creatinine 1.0.  He had a screening  CT lung cancer which showed coronary artery calcifications (not surprising) and gallstones (asymptomatic).  Since this cryptogenic stroke, he has an implantable loop recorder that has reached end of service and is left in place.  During 3 years of monitoring that did not show atrial fibrillation.  He has not had any new neurological events.  His loop recorder has reached end of service.  He presented with ST segment elevation myocardial infarction in May of 2012. At that time had an inferior wall infarction, manifesting as unstable angina pectoris and eventually a syncopal episode. The right coronary artery was totally occluded and revascularization was difficult but eventually he received a 3.5 x 24 mm Promus drug-eluting stent to the mid RCA. Also noted was 50% ostial stenosis of the first diagonal artery and 40% ostial stenosis of the second diagonal artery branches of the LAD, as well as total occlusion of a small left circumflex system. There was evidence of inferolateral hypokinesis and ejection fraction of 45-50% at that time. He presented with chest pain to [facility] in 2015 when he had a repeat coronary angiogram that was reportedly normal. I don't have that report. In May 2015 he presented with right hemiparesis and expressive aphasia with an infarction in the territory of the left middle cerebral artery. An etiology was not identified. He underwent loop recorder implantation in August 2015 and in 3 years the device has not recorded any arrhythmia. His most recent echo performed in March 2015 showed left ventricular ejection fraction of 50-55 percent with abnormal relaxation and no regional wall motion abnormalities. Both atria were described as normal in size and there were no serious valvular abnormalities.  Identical findings recorded in September 2018 echo, again EF 50-55% and the left atrium was described as normal in size.  During the hospitalization for acute shortness of breath  at [facility] in March 2023 LVEF was estimated at 35-40%.  Entresto  and Farxiga  were initiated.  Cardiac catheterization in July 2023 did not show any new coronary abnormalities.  He underwent implantation of a dual-chamber CRT-P device 07/30/2021 (Abbott Quadra Allure MP, 07/30/21, [place]).      Past Medical History:  Diagnosis Date   Adrenal adenoma, left    Anxiety    Aortic atherosclerosis (HCC)    Asthma without status asthmaticus    Atherosclerotic cerebrovascular disease    Overview:  Small and facial weakness with minimal weakness    CAD (coronary artery disease)    a. 05/2010 Inf STEMI/PCI: RCA 159m (3.5x24 Promus DES), LCX small, 100, D1 50ost, D2 40ost, EF 45-50%; b. 03/2013 Cath ([facility]): 40% mLAD, 20% stenosis mRCA (site of the previous stent); LVEF 55%.   Cervical disc disease    a. s/p C3-4 fusion 2003.   Cervical myelopathy (HCC)    a. 09/2016 Admit w/ left sided wkns->found to have progressive cervical disc dzs above and below prior fusioin site; b. 10/2016 s/p ant cervical diskectomy.   CHF (congestive heart failure) (HCC)    COPD (chronic obstructive pulmonary disease) (HCC)    Cryptogenic stroke (HCC) 05/2013    s/p  acute L MCA territory infarct; b. 07/2013 s/p MDT Linq ILR-->No AFib to documented to date (09/2016).   Current use of long term anticoagulation    DAPT therapy (ASA + clopidogrel )   Depression    Diabetes mellitus without complication (HCC)    Essential (primary) hypertension    Gastritis    GERD (gastroesophageal reflux disease)    H/O insomnia    History of cerebrovascular accident (CVA) with residual deficit 2015   facial drooping on one side of face is only residual since stroke   IDA (iron deficiency anemia)    Inguinal hernia    Insomnia disorder    Ischemic cardiomyopathy    a. 05/2010 LV gram: EF 45-50% @ time of inf MI; b. 09/2016 Echo: EF 50-55%, inf HK, Gr1 DD, mild MR.   Mixed hyperlipidemia    Nephrolithiasis    Obesity    OSA (obstructive  sleep apnea)    no cpap   Osteoarthritis    Presence of permanent cardiac pacemaker    STEMI (ST elevation myocardial infarction) (HCC) 06/07/2010   1 stent   Tobacco abuse    Urinary incontinence    Varicose veins    Vertigo     Past Surgical History:  Procedure Laterality Date   ANTERIOR CERVICAL DECOMP/DISCECTOMY FUSION N/A 10/22/2016   Procedure: ANTERIOR CERVICAL DECOMPRESSION/DISCECTOMY FUSION 2 LEVELS C3-4, C6-7;  Surgeon: Clois Fret, MD;  Location: [facility] ORS;  Service: Neurosurgery;  Laterality: N/A;   BACK SURGERY     BIV PACEMAKER INSERTION CRT-P N/A 07/30/2021   Procedure: BIV PACEMAKER INSERTION CRT-P;  Surgeon: Inocencio Soyla Lunger, MD;  Location: [facility] INVASIVE CV LAB;  Service: Cardiovascular;  Laterality: N/A;   CARDIAC CATHETERIZATION  06/07/2010   50 % ostial narrowing in 1st diagonal from LAD, 2nd diagonal had 40% ostial narrowing; AV groove circumflex was diminutive and occluded proximally; RCA totally occluded in mid segment - stented with 3.5 x 24mm Promus Element DES  - See Media tab   CARDIAC CATHETERIZATION  04/04/2013   40% mLAD, 20% mRCA at the site of the previous stent.  LVEF was 55%.   COLONOSCOPY WITH PROPOFOL  N/A 02/26/2015   Procedure: COLONOSCOPY WITH PROPOFOL ;  Surgeon: Donnice Vaughn Manes, MD;  Location: [facility] ENDOSCOPY;  Service: Endoscopy;  Laterality: N/A;   COLONOSCOPY WITH PROPOFOL  N/A 03/16/2020   Procedure: COLONOSCOPY WITH PROPOFOL ;  Surgeon: Maryruth Ole DASEN, MD;  Location: [facility] ENDOSCOPY;  Service: Endoscopy;  Laterality: N/A;   COLONOSCOPY WITH PROPOFOL  N/A 05/26/2022   Procedure: COLONOSCOPY WITH PROPOFOL ;  Surgeon: Maryruth Ole DASEN, MD;  Location: [facility] ENDOSCOPY;  Service: Endoscopy;  Laterality: N/A;   CORONARY ANGIOPLASTY     ESOPHAGOGASTRODUODENOSCOPY  02/26/2015   Procedure: ESOPHAGOGASTRODUODENOSCOPY (EGD);  Surgeon: Donnice Vaughn Manes, MD;  Location: [facility] ENDOSCOPY;  Service: Endoscopy;;   ESOPHAGOGASTRODUODENOSCOPY (EGD)  WITH PROPOFOL  N/A 03/16/2020   Procedure: ESOPHAGOGASTRODUODENOSCOPY (EGD) WITH PROPOFOL ;  Surgeon: Maryruth Ole DASEN, MD;  Location: [facility] ENDOSCOPY;  Service: Endoscopy;  Laterality: N/A;   EYE SURGERY     cataract replaced   HERNIA REPAIR     LOOP RECORDER IMPLANT  08/16/2013   Dr. Francyne   RIGHT/LEFT HEART CATH AND CORONARY ANGIOGRAPHY Bilateral 06/07/2021   Procedure: RIGHT/LEFT HEART CATH AND CORONARY ANGIOGRAPHY;  Surgeon: Darron Deatrice LABOR, MD;  Location: [facility] INVASIVE CV LAB;  Service: Cardiovascular;  Laterality: Bilateral;   XI ROBOTIC ASSISTED INGUINAL HERNIA REPAIR WITH MESH Left 05/23/2020   Procedure: XI ROBOTIC ASSISTED INGUINAL HERNIA REPAIR WITH MESH;  Surgeon: Rodolph Romano, MD;  Location: [facility] ORS;  Service: General;  Laterality: Left;    Current Medications: Outpatient Medications Prior to Visit  Medication Sig Dispense Refill   ACCU-CHEK GUIDE test strip USE 1 STRIP TO TEST ONCE DAILY     acetaminophen  (TYLENOL ) 325 MG tablet Take 650 mg by mouth every 6 (six) hours as needed for moderate pain.     aspirin  81 MG chewable tablet Chew 1 tablet (81 mg total) by mouth daily.     carvedilol  (COREG ) 6.25 MG tablet Take 1 tablet (6.25 mg total) by mouth 2 (two) times daily. 60 tablet 0   clopidogrel  (PLAVIX ) 75 MG tablet TAKE 1 TABLET(75 MG) BY MOUTH DAILY 90 tablet 3   empagliflozin  (JARDIANCE ) 10 MG TABS tablet Take 1 tablet (10 mg total) by mouth daily before breakfast. 90 tablet 3   ENTRESTO  97-103 MG TAKE 1 TABLET BY MOUTH TWICE DAILY 60 tablet 2   ferrous sulfate  325 (65 FE) MG EC tablet Take 1 tablet (325 mg total) by mouth in the morning and at bedtime. 60 tablet 11   Ipratropium-Albuterol  (COMBIVENT  RESPIMAT) 20-100 MCG/ACT AERS respimat Inhale 1 puff into the lungs every 4 (four) hours as needed.     NICOTROL  NS 10 MG/ML SOLN Place 0.1 mLs (1 mg total) into both nostrils as needed.  0   pantoprazole  (PROTONIX ) 40 MG tablet Take 40 mg by mouth daily.      PARoxetine  (PAXIL ) 40 MG tablet Take 40 mg by mouth daily.      pravastatin  (PRAVACHOL ) 80 MG tablet TAKE 1 TABLET(80 MG) BY MOUTH EVERY EVENING 90 tablet 0   traZODone  (DESYREL ) 100 MG tablet Take 1 tablet by mouth at bedtime.     furosemide  (LASIX ) 20 MG tablet Take 1 tablet (20 mg total) by mouth daily. 90 tablet 0   albuterol  (VENTOLIN  HFA) 108 (90 Base) MCG/ACT inhaler Inhale 2 puffs into the lungs as needed. (Patient not taking: Reported on 02/03/2023)     COMBIVENT  RESPIMAT 20-100 MCG/ACT AERS respimat Inhale 1 puff into the lungs every 6 (six) hours as needed for wheezing. (Patient not taking: Reported on 02/03/2023)     traZODone  (DESYREL ) 100 MG tablet Take 50 mg by mouth at bedtime as needed for sleep.  (Patient not taking: Reported on 02/03/2023)     spironolactone  (ALDACTONE ) 25 MG tablet Take 0.5 tablets (12.5 mg total) by mouth daily. (Patient not taking: Reported on 02/03/2023) 45 tablet 3   No facility-administered medications prior to visit.     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  Tobacco Use   Smoking status: Every Day    Current packs/day: 0.00    Average packs/day: 1 pack/day for 53.0 years (53.0 ttl pk-yrs)    Types: Cigarettes    Start date: 11/14/1966    Last attempt to quit: 11/14/2019    Years since quitting: 3.2    Passive exposure: Past   Smokeless tobacco: Never   Tobacco comments:    Patient smokes 6 daily.  Vaping Use   Vaping status: Former   Start date: 11/14/2019   Substances: Nicotine   Substance and Sexual Activity   Alcohol use: No   Drug use: No   Sexual activity: Not on file  Other Topics Concern   Not on file  Social History  Narrative   Lives in [place] with wife and two daughters. Feels safe in his home.   Walks dog 30 mins 2x/day w/o difficulties.      Continues to work part time.   Social Drivers of Corporate Investment Banker Strain: Low Risk  (01/28/2023)   [facility] from [facility]   Overall Financial Resource Strain (CARDIA)    Difficulty of Paying Living Expenses: Not hard at all  Food Insecurity: No Food Insecurity (01/28/2023)   Received from [facility]   Hunger Vital Sign    Worried About Running Out of Food in the Last Year: Never true    Ran Out of Food in the Last Year: Never true  Recent Concern: Food Insecurity - Food Insecurity Present (01/23/2023)   Received from [facility]   Hunger Vital Sign    Worried About Running Out of Food in the Last Year: Sometimes true    Ran Out of Food in the Last Year: Never true  Transportation Needs: No Transportation Needs (01/28/2023)   Received from [facility] - Transportation    In the past 12 months, has lack of transportation kept you from medical appointments or from getting medications?: No    Lack of Transportation (Non-Medical): No  Physical Activity: Not on file  Stress: Not on file  Social Connections: Not on file     Family History:  The patient's family history includes Cancer in his mother; Coronary artery disease in his brother; Diabetes in his brother; Stroke in his maternal grandmother.   ROS:   Please see the history of present illness.    ROS All other systems are reviewed and are negative.   PHYSICAL EXAM:   VS:  BP (!) 158/94   Pulse 62   Ht 5' 9 (1.753 m)   Wt 179 lb 3.2 oz (81.3 kg)   SpO2 92%   BMI 26.46 kg/m      General: Alert, oriented x3, no distress, lean.  Well-healed left subclavian pacemaker site. Head: no evidence of trauma, PERRL, EOMI, no exophtalmos or lid lag, no myxedema, no xanthelasma; normal ears, nose and  oropharynx Neck: normal jugular venous pulsations and no hepatojugular reflux; brisk carotid pulses without delay and no carotid bruits Chest: clear to auscultation, no signs of consolidation by percussion or palpation, normal fremitus, symmetrical and full respiratory excursions Cardiovascular: normal position and quality of the apical impulse, regular rhythm, normal first and second heart sounds, no murmurs, rubs or gallops Abdomen: no tenderness or distention, no masses by palpation, no abnormal pulsatility or arterial bruits, normal bowel sounds, no hepatosplenomegaly Extremities: no clubbing, cyanosis or edema; 2+ radial, ulnar and brachial pulses bilaterally; 2+ right femoral, posterior tibial and dorsalis pedis pulses; 2+ left femoral, posterior tibial and dorsalis pedis pulses; no subclavian or femoral bruits Neurological: grossly nonfocal Psych: Normal mood and affect    Wt Readings from Last 3 Encounters:  02/03/23 179 lb 3.2 oz (81.3 kg)  08/27/22 181 lb (82.1 kg)  05/26/22 183 lb (83 kg)      Studies/Labs Reviewed:   06/07/2021 RIGHT/LEFT HEART CATH AND CORONARY ANGIOGRAPHY    Prox Cx to Mid Cx lesion is 40% stenosed.   Prox RCA to Mid RCA lesion  is 10% stenosed.   There is moderate left ventricular systolic dysfunction.   LV end diastolic pressure is normal.   1.  Widely patent RCA stent with minimal restenosis.  No evidence of obstructive coronary artery disease. 2.  Moderately to severely reduced LV systolic function with an EF of 30 to 35%. 3.  Right heart catheterization showed normal right and left-sided filling pressures, minimal pulmonary hypertension and normal cardiac output.   Recommendations: The patient's cardiomyopathy seems to be out of proportion to his coronary artery disease and his previous inferior myocardial infarction.  There is likely a nonischemic component.  His hemodynamics look excellent.  Continue same medications. Consider CRT given underlying  left bundle branch block and worsening ejection fraction.   Fick Cardiac Output 4.62 L/min  Fick Cardiac Output Index 2.29 (L/min)/BSA  RA A Wave 9 mmHg  RA V Wave 8 mmHg  RA Mean 6 mmHg  RV Systolic Pressure 34 mmHg  RV Diastolic Pressure 3 mmHg  RV EDP 10 mmHg  PA Systolic Pressure 32 mmHg  PA Diastolic Pressure 16 mmHg  PA Mean 22 mmHg  PW A Wave 11 mmHg  PW V Wave 12 mmHg  PW Mean 10 mmHg  LV Systolic Pressure 100 mmHg  LV Diastolic Pressure -1 mmHg  LV EDP 7 mmHg  AOp Systolic Pressure 93 mmHg  AOp Diastolic Pressure 55 mmHg  AOp Mean Pressure 70 mmHg  LVp Systolic Pressure 94 mmHg  LVp Diastolic Pressure 0 mmHg  LVp EDP Pressure 9 mmHg  QP/QS 1  TPVR Index 9.59 HRUI    Echocardiogram 03/20/2021:    1. Left ventricular ejection fraction, by estimation, is 35 to 40%. The  left ventricle has moderately decreased function. The left ventricle  demonstrates regional wall motion abnormalities (see scoring  diagram/findings for description). Left ventricular   diastolic parameters are consistent with Grade I diastolic dysfunction  (impaired relaxation). There is LV global hypokinesis with inferior and  inferolateral wall akinesis.   2. Right ventricular systolic function is normal. The right ventricular  size is normal.   3. The mitral valve is normal in structure. Mild mitral valve  regurgitation.   4. The aortic valve is tricuspid. Aortic valve regurgitation is not  visualized. Aortic valve sclerosis is present, with no evidence of aortic  valve stenosis.   5. Aortic dilatation noted. There is mild dilatation of the aortic root,  measuring 40 mm.   6. The inferior vena cava is normal in size with <50% respiratory  variability, suggesting right atrial pressure of 8 mmHg.   EKG:    EKG Interpretation Date/Time:  Tuesday February 03 2023 13:06:16 EST Ventricular Rate:  62 PR Interval:  212 QRS Duration:  132 QT Interval:  456 QTC Calculation: 462 R  Axis:   -54  Text Interpretation: AV dual-paced rhythm with prolonged AV conduction with occasional ventricular-paced complexes When compared with ECG of 30-Jul-2021 17:43, Premature ventricular complexes are no longer Present Vent. rate has decreased BY  11 BPM Confirmed by Rashae Rother [id number]) on 02/03/2023 1:24:10 PM         Recent Labs:     Latest Ref Rng & Units 02/03/2023    2:15 PM 07/29/2021   10:36 AM 05/30/2021   12:06 PM  BMP  Glucose 70 - 99 mg/dL 888  893  883   BUN 8 - 27 mg/dL 14  17  21    Creatinine 0.76 - 1.27 mg/dL 9.02  9.03  8.95   BUN/Creat  Ratio 10 - 24 14  18  20    Sodium 134 - 144 mmol/L 142  137  140   Potassium 3.5 - 5.2 mmol/L 5.3  4.5  4.9   Chloride 96 - 106 mmol/L 103  103  98   CO2 20 - 29 mmol/L 27  30  26    Calcium  8.6 - 10.2 mg/dL 89.9  9.7  89.8       Lipid Panel    Component Value Date/Time   CHOL 115 12/22/2019 0908   CHOL 110 05/22/2013 0526   TRIG 66 12/22/2019 0908   TRIG 162 05/22/2013 0526   HDL 46 12/22/2019 0908   HDL 17 (L) 05/22/2013 0526   CHOLHDL 2.5 12/22/2019 0908   CHOLHDL 3.1 10/08/2016 0507   VLDL 17 10/08/2016 0507   VLDL 32 05/22/2013 0526   LDLCALC 55 12/22/2019 0908   LDLCALC 61 05/22/2013 0526   LDLDIRECT 67 04/19/2021 1126   04/25/2021 Hemoglobin A1c 6.3%   HYPERTENSION CONTROL Vitals:   02/03/23 1256 02/03/23 1348 02/05/23 1432 02/05/23 1433  BP: (!) 158/94 (!) 150/82 (!) 150/82 (!) 158/94    The patient's blood pressure is elevated above target today.  In order to address the patient's elevated BP: A current anti-hypertensive medication was adjusted today.     ASSESSMENT:    1. Chronic combined systolic and diastolic heart failure (HCC)   2. Coronary artery disease involving native coronary artery of native heart without angina pectoris   3. Status post biventricular pacemaker   4. History of arterial ischemic stroke   5. Hyperlipidemia LDL goal <70   6. Prediabetes   7. Primary  hypertension   8. Chronic obstructive pulmonary disease, unspecified COPD type (HCC)      PLAN:  In order of problems listed above:  CHF: Functional status improved after implantation of biventricular device.  Most recent LVEF 40-45% by echo November 2023.  He is on guideline directed medical therapy with Entresto /carvedilol /spironolactone /dapagliflozin .  Appears clinically euvolemic on the current diuretic dose.   CAD: Asymptomatic.  No evidence of new coronary lesions on angiography in early July 2023.  Denies angina pectoris. CRT-P: Normal device function.  98% biventricular pacing.  Monitored by Dr. Inocencio.   Hx cryptogenic ischemic stroke: This has not recurred in over 8 years.  He did not have atrial fibrillation during 3 years of implantable loop recorder monitoring and no atrial fibrillation has been detected since his biventricular device was implanted.  Remains on dual antiplatelet therapy with aspirin  and clopidogrel . HLP/prediabetes: All metabolic parameters show improving control. HTN: Blood pressure has been persistently high.  Will increase his spironolactone  to 25 mg once daily, but we will have to keep a close eye on his potassium level since he is no longer taking loop diuretics. COPD/history smoking: Unfortunately he is smoking a few cigarettes a day again.  Strongly encouraged him to quit smoking permanently.  Has had benefit from treatment with Trelegy. Depression: Is markedly improved compared to last year.    Medication Adjustments/Labs and Tests Ordered: Current medicines are reviewed at length with the patient today.  Concerns regarding medicines are outlined above.  Medication changes, Labs and Tests ordered today are listed in the Patient Instructions below. Patient Instructions  Medication Instructions:  INCREASE SPIRONOLACTONE  TO 25 MG DAILY TAKE FUROSEMIDE  20 MG DAILY AS NEEDED- ONLY *If you need a refill on your cardiac medications before your next  appointment, please call your pharmacy*  Lab Work: BMP, BNP If you  have labs (blood work) drawn today and your tests are completely normal, you will receive your results only by: MyChart Message (if you have MyChart) OR A paper copy in the mail If you have any lab test that is abnormal or we need to change your treatment, we will call you to review the results.  Follow-Up: At [facility], you and your health needs are our priority.  As part of our continuing mission to provide you with exceptional heart care, we have created designated Provider Care Teams.  These Care Teams include your primary Cardiologist (physician) and Advanced Practice Providers (APPs -  Physician Assistants and Nurse Practitioners) who all work together to provide you with the care you need, when you need it.  We recommend signing up for the patient portal called MyChart.  Sign up information is provided on this After Visit Summary.  MyChart is used to connect with patients for Virtual Visits (Telemedicine).  Patients are able to view lab/test results, encounter notes, upcoming appointments, etc.  Non-urgent messages can be sent to your provider as well.   To learn more about what you can do with MyChart, go to [email or web address].    Your next appointment:    September 2025  Provider:   Jerel Balding, MD             Signed, Jerel Balding, MD  02/05/2023 2:33 PM    [facility] [address] [place], [place], [place]  [address] Phone: [phone]; Fax: [phone]   Shared Decision Making/Informed Consent The risks [stroke (1 in 1000), death (1 in 1000), kidney failure [usually temporary] (1 in 500), bleeding (1 in 200), allergic reaction [possibly serious] (1 in 200)], benefits (diagnostic support and management of coronary artery disease) and alternatives of a cardiac catheterization were discussed in detail with Mr. Bostock and he is willing to proceed.

## 2023-02-03 NOTE — Patient Instructions (Signed)
 Medication Instructions:  INCREASE SPIRONOLACTONE  TO 25 MG DAILY TAKE FUROSEMIDE  20 MG DAILY AS NEEDED- ONLY *If you need a refill on your cardiac medications before your next appointment, please call your pharmacy*  Lab Work: BMP, BNP If you have labs (blood work) drawn today and your tests are completely normal, you will receive your results only by: MyChart Message (if you have MyChart) OR A paper copy in the mail If you have any lab test that is abnormal or we need to change your treatment, we will call you to review the results.  Follow-Up: At [facility] ([facility]), you and your health needs are our priority.  As part of our continuing mission to provide you with exceptional heart care, we have created designated Provider Care Teams.  These Care Teams include your primary Cardiologist (physician) and Advanced Practice Providers (APPs -  Physician Assistants and Nurse Practitioners) who all work together to provide you with the care you need, when you need it.  We recommend signing up for the patient portal called MyChart.  Sign up information is provided on this After Visit Summary.  MyChart is used to connect with patients for Virtual Visits (Telemedicine).  Patients are able to view lab/test results, encounter notes, upcoming appointments, etc.  Non-urgent messages can be sent to your provider as well.   To learn more about what you can do with MyChart, go to [email or web address].    Your next appointment:    September 2025  Provider:   Jerel Balding, MD

## 2023-02-04 LAB — BASIC METABOLIC PANEL
BUN/Creatinine Ratio: 14 (ref 10–24)
BUN: 14 mg/dL (ref 8–27)
CO2: 27 mmol/L (ref 20–29)
Calcium: 10 mg/dL (ref 8.6–10.2)
Chloride: 103 mmol/L (ref 96–106)
Creatinine, Ser: 0.97 mg/dL (ref 0.76–1.27)
Glucose: 111 mg/dL — ABNORMAL HIGH (ref 70–99)
Potassium: 5.3 mmol/L — ABNORMAL HIGH (ref 3.5–5.2)
Sodium: 142 mmol/L (ref 134–144)
eGFR: 83 mL/min/{1.73_m2} (ref >59)

## 2023-02-04 LAB — BRAIN NATRIURETIC PEPTIDE: BNP: 101.5 pg/mL — ABNORMAL HIGH (ref 0.0–100.0)

## 2023-02-05 ENCOUNTER — Other Ambulatory Visit: Payer: Self-pay | Admitting: *Deleted

## 2023-02-05 DIAGNOSIS — E875 Hyperkalemia: Secondary | ICD-10-CM

## 2023-02-07 ENCOUNTER — Other Ambulatory Visit: Payer: Self-pay | Admitting: Cardiovascular Disease

## 2023-02-22 ENCOUNTER — Other Ambulatory Visit: Payer: Self-pay | Admitting: Internal Medicine

## 2023-02-27 DIAGNOSIS — M170 Bilateral primary osteoarthritis of knee: Secondary | ICD-10-CM | POA: Diagnosis not present

## 2023-03-06 NOTE — Progress Notes (Signed)
Remote pacemaker transmission.

## 2023-03-13 DIAGNOSIS — E875 Hyperkalemia: Secondary | ICD-10-CM | POA: Diagnosis not present

## 2023-03-14 ENCOUNTER — Encounter: Payer: Self-pay | Admitting: Cardiovascular Disease

## 2023-03-14 LAB — BASIC METABOLIC PANEL
BUN/Creatinine Ratio: 22 (ref 10–24)
BUN: 20 mg/dL (ref 8–27)
CO2: 26 mmol/L (ref 20–29)
Calcium: 10.1 mg/dL (ref 8.6–10.2)
Chloride: 99 mmol/L (ref 96–106)
Creatinine, Ser: 0.89 mg/dL (ref 0.76–1.27)
Glucose: 107 mg/dL — ABNORMAL HIGH (ref 70–99)
Potassium: 4.9 mmol/L (ref 3.5–5.2)
Sodium: 139 mmol/L (ref 134–144)
eGFR: 91 mL/min/{1.73_m2} (ref >59)

## 2023-03-26 ENCOUNTER — Other Ambulatory Visit: Payer: Self-pay | Admitting: Cardiovascular Disease

## 2023-03-26 DIAGNOSIS — J449 Chronic obstructive pulmonary disease, unspecified: Secondary | ICD-10-CM | POA: Diagnosis not present

## 2023-03-26 DIAGNOSIS — Z9981 Dependence on supplemental oxygen: Secondary | ICD-10-CM | POA: Diagnosis not present

## 2023-03-26 DIAGNOSIS — F17210 Nicotine dependence, cigarettes, uncomplicated: Secondary | ICD-10-CM | POA: Diagnosis not present

## 2023-03-26 DIAGNOSIS — R0609 Other forms of dyspnea: Secondary | ICD-10-CM | POA: Diagnosis not present

## 2023-04-20 ENCOUNTER — Ambulatory Visit: Payer: Medicare HMO | Admitting: Cardiology

## 2023-05-01 ENCOUNTER — Ambulatory Visit (INDEPENDENT_AMBULATORY_CARE_PROVIDER_SITE_OTHER): Payer: Medicare HMO

## 2023-05-01 DIAGNOSIS — I447 Left bundle-branch block, unspecified: Secondary | ICD-10-CM

## 2023-05-02 LAB — CUP PACEART REMOTE DEVICE CHECK
Battery Remaining Longevity: 71 mo
Battery Remaining Percentage: 78 %
Battery Voltage: 2.99 V
Brady Statistic AP VP Percent: 8.6 %
Brady Statistic AP VS Percent: 1 %
Brady Statistic AS VP Percent: 89 %
Brady Statistic AS VS Percent: 1.3 %
Brady Statistic RA Percent Paced: 7.4 %
Date Time Interrogation Session: 20250411020008
Implantable Lead Connection Status: 753985
Implantable Lead Connection Status: 753985
Implantable Lead Connection Status: 753985
Implantable Lead Implant Date: 20230711
Implantable Lead Implant Date: 20230711
Implantable Lead Implant Date: 20230711
Implantable Lead Location: 753858
Implantable Lead Location: 753859
Implantable Lead Location: 753860
Implantable Pulse Generator Implant Date: 20230711
Lead Channel Impedance Value: 1100 Ohm
Lead Channel Impedance Value: 450 Ohm
Lead Channel Impedance Value: 590 Ohm
Lead Channel Pacing Threshold Amplitude: 0.5 V
Lead Channel Pacing Threshold Amplitude: 0.5 V
Lead Channel Pacing Threshold Amplitude: 0.75 V
Lead Channel Pacing Threshold Pulse Width: 0.5 ms
Lead Channel Pacing Threshold Pulse Width: 0.5 ms
Lead Channel Pacing Threshold Pulse Width: 0.8 ms
Lead Channel Sensing Intrinsic Amplitude: 12 mV
Lead Channel Sensing Intrinsic Amplitude: 5 mV
Lead Channel Setting Pacing Amplitude: 2 V
Lead Channel Setting Pacing Amplitude: 2.5 V
Lead Channel Setting Pacing Amplitude: 2.5 V
Lead Channel Setting Pacing Pulse Width: 0.5 ms
Lead Channel Setting Pacing Pulse Width: 0.8 ms
Lead Channel Setting Sensing Sensitivity: 2 mV
Pulse Gen Model: 3562
Pulse Gen Serial Number: 8088605

## 2023-05-08 ENCOUNTER — Other Ambulatory Visit: Payer: Self-pay | Admitting: Cardiovascular Disease

## 2023-06-02 DIAGNOSIS — J449 Chronic obstructive pulmonary disease, unspecified: Secondary | ICD-10-CM | POA: Diagnosis not present

## 2023-06-02 DIAGNOSIS — E119 Type 2 diabetes mellitus without complications: Secondary | ICD-10-CM | POA: Diagnosis not present

## 2023-06-02 DIAGNOSIS — I5022 Chronic systolic (congestive) heart failure: Secondary | ICD-10-CM | POA: Diagnosis not present

## 2023-06-02 DIAGNOSIS — I25119 Atherosclerotic heart disease of native coronary artery with unspecified angina pectoris: Secondary | ICD-10-CM | POA: Diagnosis not present

## 2023-06-02 DIAGNOSIS — Z87891 Personal history of nicotine dependence: Secondary | ICD-10-CM | POA: Diagnosis not present

## 2023-06-02 DIAGNOSIS — I110 Hypertensive heart disease with heart failure: Secondary | ICD-10-CM | POA: Diagnosis not present

## 2023-06-02 DIAGNOSIS — F325 Major depressive disorder, single episode, in full remission: Secondary | ICD-10-CM | POA: Diagnosis not present

## 2023-06-08 NOTE — Progress Notes (Signed)
 Remote pacemaker transmission.

## 2023-06-08 NOTE — Addendum Note (Signed)
 Addended by: Lott Rouleau A on: 06/08/2023 09:25 AM   Modules accepted: Orders

## 2023-06-16 ENCOUNTER — Other Ambulatory Visit: Payer: Self-pay | Admitting: Cardiovascular Disease

## 2023-06-24 DIAGNOSIS — F17210 Nicotine dependence, cigarettes, uncomplicated: Secondary | ICD-10-CM | POA: Diagnosis not present

## 2023-06-24 DIAGNOSIS — Z1331 Encounter for screening for depression: Secondary | ICD-10-CM | POA: Diagnosis not present

## 2023-06-24 DIAGNOSIS — J441 Chronic obstructive pulmonary disease with (acute) exacerbation: Secondary | ICD-10-CM | POA: Diagnosis not present

## 2023-06-24 DIAGNOSIS — R053 Chronic cough: Secondary | ICD-10-CM | POA: Diagnosis not present

## 2023-06-25 ENCOUNTER — Other Ambulatory Visit: Payer: Self-pay | Admitting: Specialist

## 2023-06-25 DIAGNOSIS — J449 Chronic obstructive pulmonary disease, unspecified: Secondary | ICD-10-CM

## 2023-06-25 DIAGNOSIS — F17210 Nicotine dependence, cigarettes, uncomplicated: Secondary | ICD-10-CM

## 2023-07-03 ENCOUNTER — Ambulatory Visit
Admission: RE | Admit: 2023-07-03 | Discharge: 2023-07-03 | Disposition: A | Discharge status: Home / Self Care | Source: Ambulatory Visit | Attending: Specialist | Admitting: Specialist

## 2023-07-03 DIAGNOSIS — F17210 Nicotine dependence, cigarettes, uncomplicated: Secondary | ICD-10-CM | POA: Diagnosis not present

## 2023-07-03 DIAGNOSIS — J449 Chronic obstructive pulmonary disease, unspecified: Secondary | ICD-10-CM | POA: Insufficient documentation

## 2023-07-11 IMAGING — MR MR ABDOMEN WO/W CM
18 series · 48 of 48 positions shown · IV contrast (gadavist)
Comparison: Multiple prior studies most recent MRI of the abdomen
from 9583.

CLINICAL DATA: Hyperdense interpolar RIGHT cortical lesion arising
from the RIGHT kidney.

EXAM:
MRI ABDOMEN WITHOUT AND WITH CONTRAST
TECHNIQUE: Multiplanar multisequence MR imaging of the abdomen was performed
both before and after the administration of intravenous contrast.
CONTRAST:  9mL GADAVIST GADOBUTROL 1 MMOL/ML IV SOLN

[Series 3: cor haste · coronal · 6.0mm · 1.19mm/px · 2 of 32 slices shown]
[im 1/32]
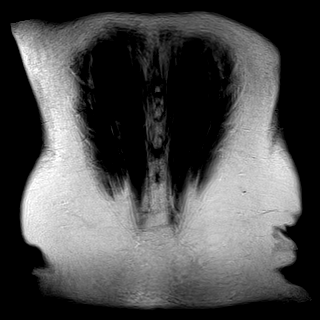
[im 32/32]
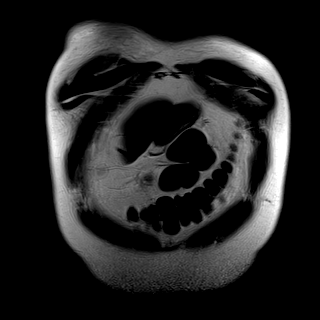

[Series 7: T2 · axial · 6.0mm · 1.19mm/px · z∈[-168,+56]mm · 2 of 32 slices shown]
[im 1/32]
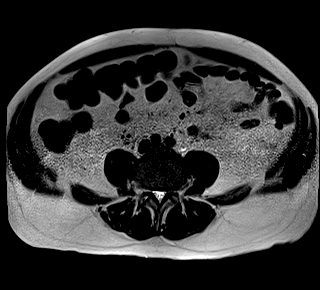
[im 32/32]
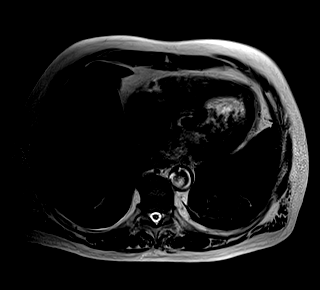

[Series 8: DWI · axial · 6.0mm · 1.42mm/px · z∈[-172,+51]mm · 5 of 96 slices shown (1 of 2)]
[im 1/96]
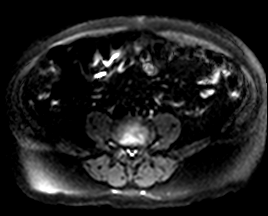
[im 24/96]
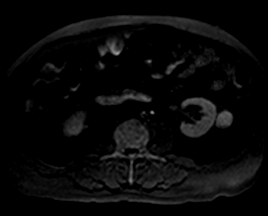
[im 48/96]
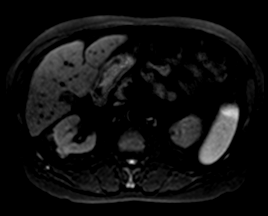
[im 72/96]
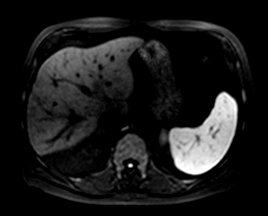
[im 96/96]
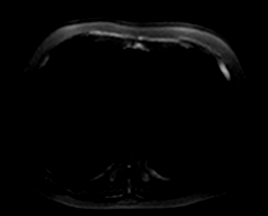

[Series 9: DWI · axial · 6.0mm · 1.42mm/px · 1 of 32 slices shown (2 of 2)]
[im 1/32]
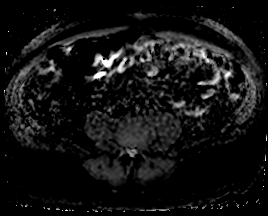

[Series 11: T2 fat-sat · axial · 6.0mm · 1.19mm/px · 1 of 32 slices shown]
[im 1/32]
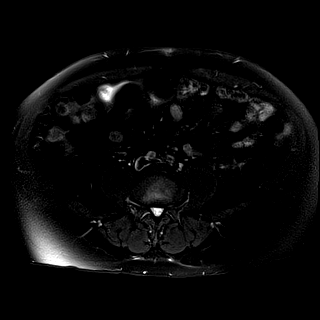

[Series 13: in & out · axial · 3.0mm · 1.19mm/px · z∈[-175,+62]mm · 3 of 80 slices shown (1 of 2)]
[im 1/80]
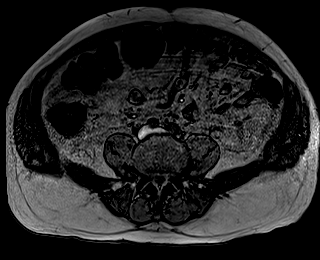
[im 40/80]
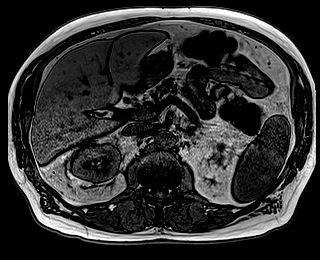
[im 80/80]
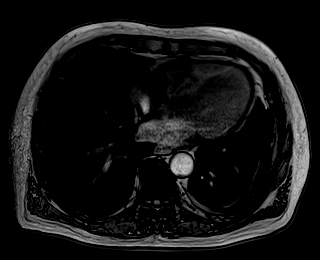

[Series 14: in & out · axial · 3.0mm · 1.19mm/px · z∈[-175,+62]mm · 3 of 80 slices shown (2 of 2)]
[im 1/80]
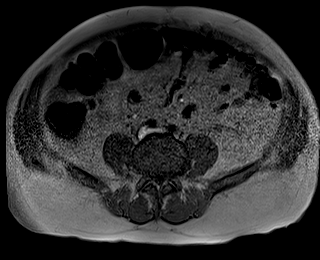
[im 40/80]
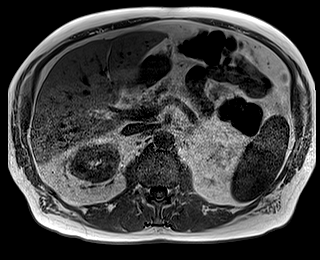
[im 80/80]
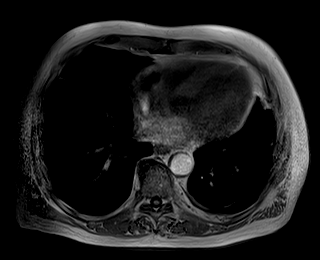

[Series 15: bSSFP · axial · 6.0mm · 0.74mm/px · 1 of 32 slices shown]
[im 1/32]
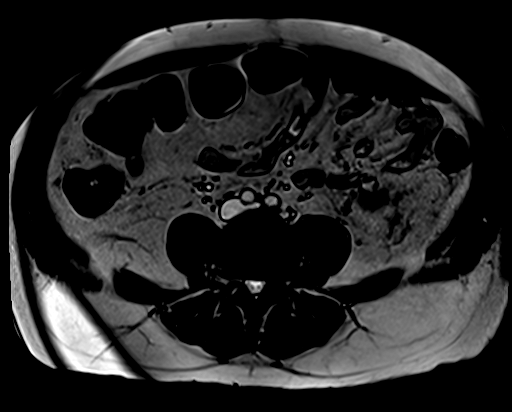

[Series 16: T1 dynamic · axial · non-contrast · 3.0mm · 1.19mm/px · z∈[-175,+62]mm · 3 of 80 slices shown (1 of 9)]
[im 1/80]
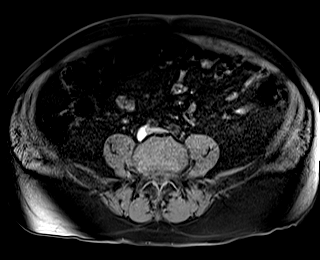
[im 40/80]
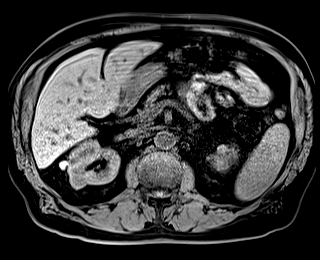
[im 80/80]
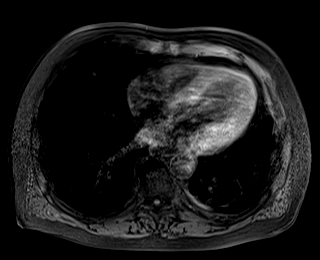

[Series 17: T1 dynamic · axial · 3.0mm · 1.19mm/px · z∈[-175,+62]mm · 3 of 80 slices shown (2 of 9)]
[im 1/80]
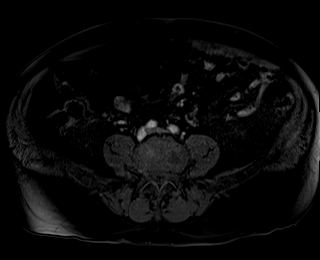
[im 40/80]
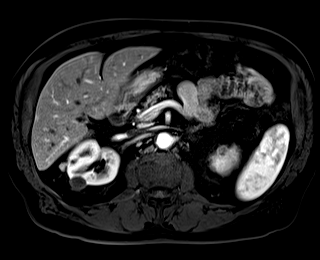
[im 80/80]
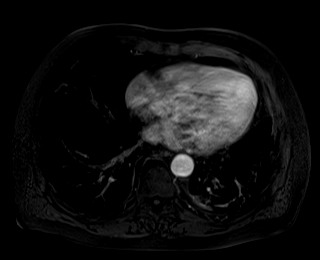

[Series 18: T1 dynamic · axial · 3.0mm · 1.19mm/px · z∈[-175,+62]mm · 3 of 80 slices shown (3 of 9)]
[im 1/80]
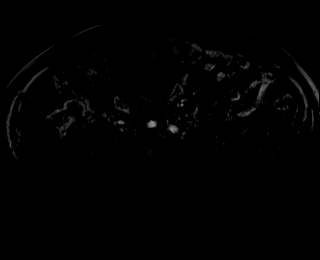
[im 40/80]
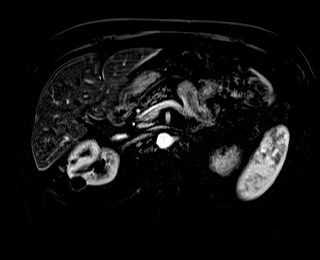
[im 80/80]
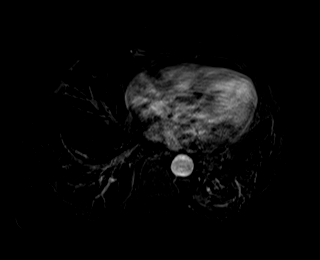

[Series 19: T1 dynamic · axial · 3.0mm · 1.19mm/px · z∈[-175,+62]mm · 3 of 80 slices shown (4 of 9)]
[im 1/80]
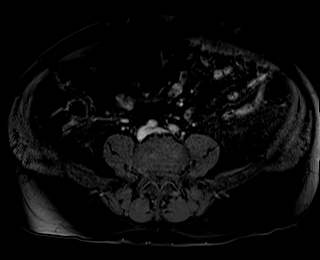
[im 40/80]
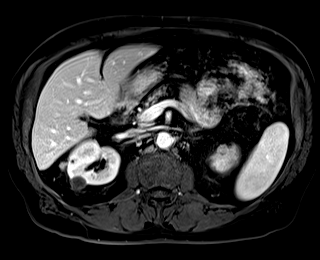
[im 80/80]
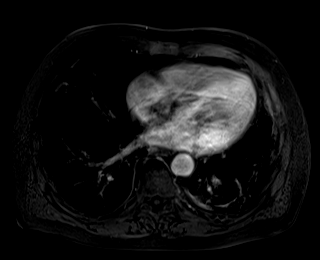

[Series 20: T1 dynamic · axial · 3.0mm · 1.19mm/px · z∈[-175,+62]mm · 3 of 80 slices shown (5 of 9)]
[im 1/80]
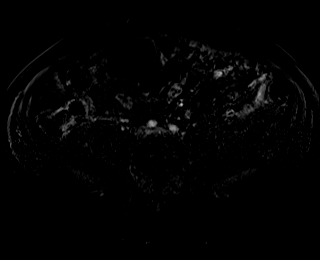
[im 40/80]
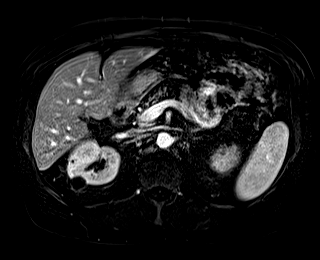
[im 80/80]
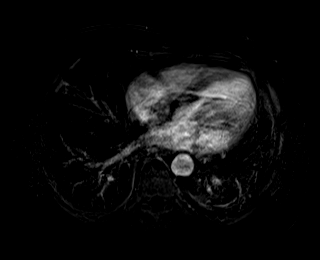

[Series 21: T1 dynamic · axial · 3.0mm · 1.19mm/px · z∈[-175,+62]mm · 3 of 80 slices shown (6 of 9)]
[im 1/80]
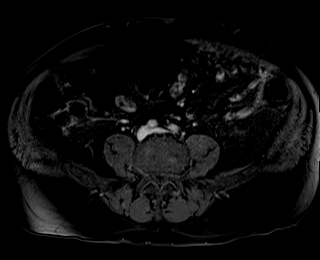
[im 40/80]
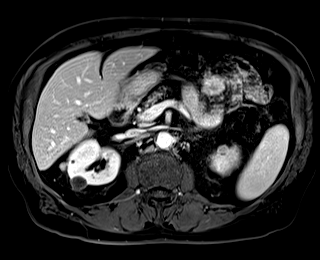
[im 80/80]
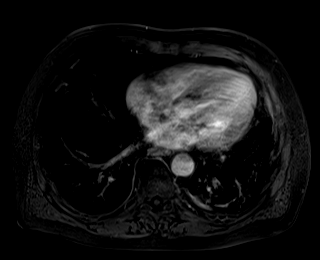

[Series 22: T1 dynamic · axial · 3.0mm · 1.19mm/px · z∈[-175,+62]mm · 3 of 80 slices shown (7 of 9)]
[im 1/80]
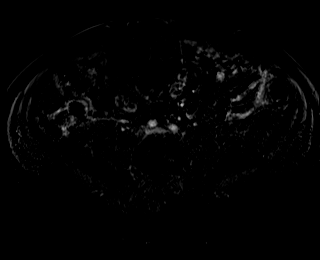
[im 40/80]
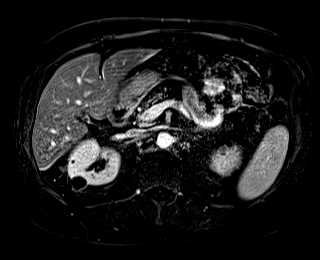
[im 80/80]
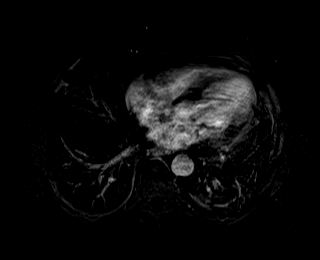

[Series 23: T1 dynamic post-contrast · coronal · 3.0mm · 1.31mm/px · 3 of 80 slices shown]
[im 1/80]
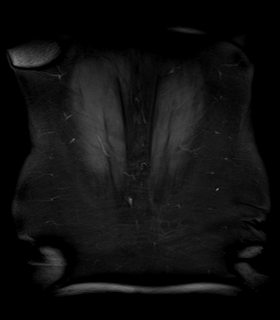
[im 40/80]
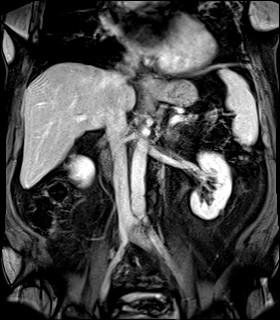
[im 80/80]
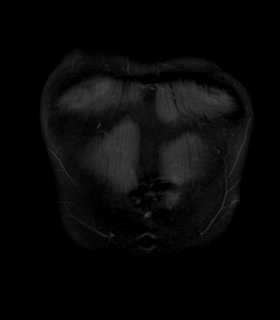

[Series 24: T1 dynamic · axial · 3.0mm · 1.19mm/px · z∈[-175,+62]mm · 3 of 80 slices shown (8 of 9)]
[im 1/80]
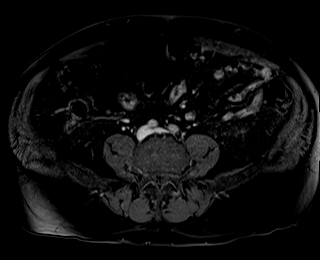
[im 40/80]
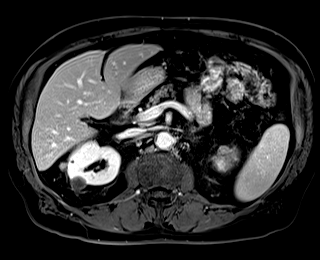
[im 80/80]
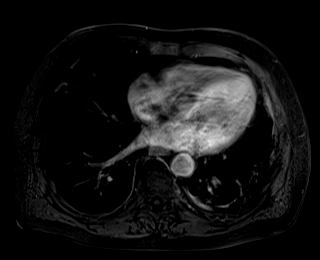

[Series 25: T1 dynamic · axial · 3.0mm · 1.19mm/px · z∈[-175,+62]mm · 3 of 80 slices shown (9 of 9)]
[im 1/80]
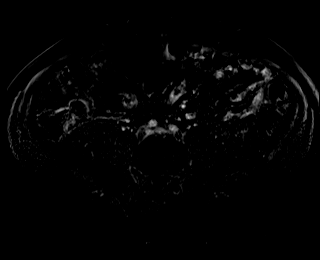
[im 40/80]
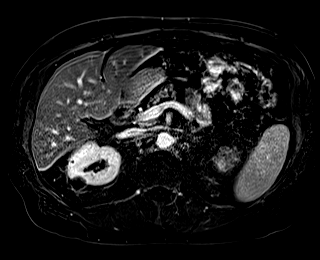
[im 80/80]
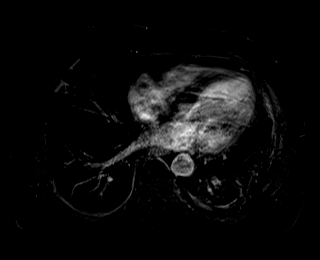

[48 of 48 positions shown; findings below may reference images not displayed]

FINDINGS: Lower chest: Incidental imaging of the lung bases without effusion
or consolidation, limited assessment on MRI.

Hepatobiliary: Liver with smooth contours, no focal, suspicious
hepatic lesion. Cholelithiasis with numerous gallstones in the
gallbladder lumen. No pericholecystic stranding. Similar appearance
compared to prior imaging. No biliary duct dilation. Portal vein is
patent. Hepatic veins are patent.

Pancreas: Pancreas with preserved intrinsic T1 signal without ductal
dilation, inflammation or solid mass lesion. The T2 hyperintense
area in the head of the pancreas measuring 5 mm (image [DATE])
unchanged dating back to 9583.

Spleen: Spleen normal size and contour. No focal, suspicious splenic
lesion.

Adrenals/Urinary Tract: LEFT adrenal measuring 3.9 x 2.9 cm, this
shows signal loss on out of phase T1 weighted gradient echo imaging
as compared to in phase, previously 3.9 x 2.9 cm.

RIGHT adrenal gland is normal.

Enhancing lesion in the upper pole of the LEFT kidney (image 41/17)
shows T2 hypointensity relative to renal cortex and "low level"
enhancement. This measures approximately 13 mm. Previously 8 mm.

RIGHT kidney: Area of concern arising from the interpolar RIGHT
kidney compatible with mildly proteinaceous cyst. Cysts are seen
elsewhere in the bilateral kidneys. No signs of hydronephrosis. No
perinephric stranding.

Stomach/Bowel: Gastrointestinal tract is unremarkable to the extent
evaluated on abdominal MRI.

Vascular/Lymphatic: Patent abdominal vessels. There is no
gastrohepatic or hepatoduodenal ligament lymphadenopathy. No
retroperitoneal or mesenteric lymphadenopathy.

Other:  No ascites.

Musculoskeletal: No suspicious bone lesions identified.
IMPRESSION: 1. Enhancing lesion in the upper pole of the LEFT kidney measuring
13 mm, previously 8 mm. Findings are suspicious for small papillary
renal cell carcinoma with enlargement since prior imaging.
2. Bilateral cysts some with hemorrhagic or proteinaceous features.
Specifically no sign of enhancement in the area of concern in the
posterior interpolar RIGHT kidney. Bosniak category II and I lesions
elsewhere.
3. Stable large LEFT adrenal adenoma.
4. Cholelithiasis with numerous gallstones in the gallbladder lumen.
No evidence of acute cholecystitis.

These results will be called to the ordering clinician or
representative by the Radiologist Assistant, and communication
documented in the PACS or [REDACTED].

## 2023-07-20 ENCOUNTER — Encounter: Payer: Self-pay | Admitting: Cardiovascular Disease

## 2023-07-23 ENCOUNTER — Encounter: Payer: Self-pay | Admitting: Cardiovascular Disease

## 2023-07-23 MED ORDER — EMPAGLIFLOZIN 10 MG PO TABS: 10.0000 mg | ORAL_TABLET | Freq: Every day | ORAL | 0 refills | Status: DC

## 2023-07-29 ENCOUNTER — Other Ambulatory Visit: Payer: Self-pay | Admitting: Cardiovascular Disease

## 2023-07-31 ENCOUNTER — Ambulatory Visit: Payer: Medicare HMO

## 2023-07-31 DIAGNOSIS — I447 Left bundle-branch block, unspecified: Secondary | ICD-10-CM | POA: Diagnosis not present

## 2023-08-01 LAB — CUP PACEART REMOTE DEVICE CHECK
Battery Remaining Longevity: 67 mo
Battery Remaining Percentage: 75 %
Battery Voltage: 2.98 V
Brady Statistic AP VP Percent: 8.4 %
Brady Statistic AP VS Percent: 1 %
Brady Statistic AS VP Percent: 89 %
Brady Statistic AS VS Percent: 1.3 %
Brady Statistic RA Percent Paced: 7.1 %
Date Time Interrogation Session: 20250711020020
Implantable Lead Connection Status: 753985
Implantable Lead Connection Status: 753985
Implantable Lead Connection Status: 753985
Implantable Lead Implant Date: 20230711
Implantable Lead Implant Date: 20230711
Implantable Lead Implant Date: 20230711
Implantable Lead Location: 753858
Implantable Lead Location: 753859
Implantable Lead Location: 753860
Implantable Pulse Generator Implant Date: 20230711
Lead Channel Impedance Value: 1075 Ohm
Lead Channel Impedance Value: 450 Ohm
Lead Channel Impedance Value: 590 Ohm
Lead Channel Pacing Threshold Amplitude: 0.5 V
Lead Channel Pacing Threshold Amplitude: 0.5 V
Lead Channel Pacing Threshold Amplitude: 0.75 V
Lead Channel Pacing Threshold Pulse Width: 0.5 ms
Lead Channel Pacing Threshold Pulse Width: 0.5 ms
Lead Channel Pacing Threshold Pulse Width: 0.8 ms
Lead Channel Sensing Intrinsic Amplitude: 12 mV
Lead Channel Sensing Intrinsic Amplitude: 5 mV
Lead Channel Setting Pacing Amplitude: 2 V
Lead Channel Setting Pacing Amplitude: 2.5 V
Lead Channel Setting Pacing Amplitude: 2.5 V
Lead Channel Setting Pacing Pulse Width: 0.5 ms
Lead Channel Setting Pacing Pulse Width: 0.8 ms
Lead Channel Setting Sensing Sensitivity: 2 mV
Pulse Gen Model: 3562
Pulse Gen Serial Number: 8088605

## 2023-08-03 ENCOUNTER — Ambulatory Visit: Payer: Self-pay | Admitting: Cardiology

## 2023-08-06 ENCOUNTER — Other Ambulatory Visit: Payer: Self-pay | Admitting: Cardiovascular Disease

## 2023-08-26 ENCOUNTER — Other Ambulatory Visit: Payer: Self-pay

## 2023-08-26 MED ORDER — PRAVASTATIN SODIUM 80 MG PO TABS: 80.0000 mg | ORAL_TABLET | Freq: Every day | ORAL | 1 refills | Status: DC

## 2023-09-13 ENCOUNTER — Other Ambulatory Visit: Payer: Self-pay | Admitting: Cardiovascular Disease

## 2023-09-24 DIAGNOSIS — J449 Chronic obstructive pulmonary disease, unspecified: Secondary | ICD-10-CM | POA: Diagnosis not present

## 2023-09-24 DIAGNOSIS — Z87891 Personal history of nicotine dependence: Secondary | ICD-10-CM | POA: Diagnosis not present

## 2023-09-24 DIAGNOSIS — G4733 Obstructive sleep apnea (adult) (pediatric): Secondary | ICD-10-CM | POA: Diagnosis not present

## 2023-10-01 ENCOUNTER — Ambulatory Visit: Attending: Cardiovascular Disease | Admitting: Cardiovascular Disease

## 2023-10-01 ENCOUNTER — Encounter: Payer: Self-pay | Admitting: Cardiovascular Disease

## 2023-10-01 VITALS — BP 102/66 | HR 65 | Ht 69.0 in | Wt 179.6 lb

## 2023-10-01 DIAGNOSIS — E785 Hyperlipidemia, unspecified: Secondary | ICD-10-CM

## 2023-10-01 DIAGNOSIS — R7303 Prediabetes: Secondary | ICD-10-CM

## 2023-10-01 DIAGNOSIS — I10 Essential (primary) hypertension: Secondary | ICD-10-CM | POA: Diagnosis not present

## 2023-10-01 DIAGNOSIS — I5042 Chronic combined systolic (congestive) and diastolic (congestive) heart failure: Secondary | ICD-10-CM | POA: Diagnosis not present

## 2023-10-01 DIAGNOSIS — Z8673 Personal history of transient ischemic attack (TIA), and cerebral infarction without residual deficits: Secondary | ICD-10-CM

## 2023-10-01 DIAGNOSIS — I2510 Atherosclerotic heart disease of native coronary artery without angina pectoris: Secondary | ICD-10-CM | POA: Diagnosis not present

## 2023-10-01 DIAGNOSIS — Z950 Presence of cardiac pacemaker: Secondary | ICD-10-CM | POA: Diagnosis not present

## 2023-10-01 DIAGNOSIS — J449 Chronic obstructive pulmonary disease, unspecified: Secondary | ICD-10-CM | POA: Diagnosis not present

## 2023-10-01 MED ORDER — ROSUVASTATIN CALCIUM 20 MG PO TABS: 20.0000 mg | ORAL_TABLET | Freq: Every day | ORAL | 3 refills | Status: AC

## 2023-10-01 NOTE — Progress Notes (Signed)
 Cardiology Office Note    Date:  10/01/2023   ID:  Louis Gutierrez, DOB April 22, 1950, MRN [id number]  PCP:  Lenon Layman ORN, MD  Cardiologist:   Jerel Balding, MD   No chief complaint on file.   History of Present Illness:  Louis Gutierrez is a 73 y.o. male with coronary artery disease, congestive heart failure, LBBB s/p CRT-P (Abbott Quadra Allure MP, 07/30/21, [place]), COPD and a history of cryptogenic stroke.  Louis Gutierrez is having a much better year.  He has not had any new neurological events and appears to have recovered completely from the sequelae of his previous stroke.  He quit smoking a month ago and feels that his breathing is substantially better already.  He has not had any recent falls after experiencing 2 serious injuries in the second half of last year.  He denies chest pain at rest or with activity, dizziness or syncope, palpitations, orthopnea, PND, leg edema.  He has occasional wheezing.  Inhalers have made a big difference.  Echo 11/22/2021 performed a few months after implantation of his biventricular pacemaker showed improved LVEF 40-45%, with inferior wall akinesis and grade 1 diastolic dysfunction.  We have not repeated his echocardiogram since  Interrogation of his pacemaker today shows normal device function with estimated generator longevity of 5.3 years, 7% atrial pacing and 97% biventricular pacing.  He has only had a single episode of mode switch lasting for about 10 seconds which looks like an irregular atrial tachycardia.  He has not had any episodes of high ventricular rate.  Thoracic impedance/corevue is in normal range.  ECG today shows atrial sensed, ventricular paced rhythm with a very narrow QRS duration of only 110 ms and a distinct initial R wave in lead V1 and V2.  He continues to take maximum tolerated doses of Entresto , carvedilol , spironolactone  as well as Jardiance .  He is on dual antiplatelet therapy with aspirin  and clopidogrel .  Metabolic control  is good with a hemoglobin A1c of 5.9%, but his most recent lipid profile from 06/02/2023 shows an increased LDL cholesterol of 102 (on pravastatin  80 mg daily).  He has a chronically low HDL, most recently 37.  Triglycerides are normal.  Renal function parameters remain within normal range as well.   He had a screening CT lung cancer which showed coronary artery calcifications (not surprising) and gallstones (asymptomatic).   He had an unexplained stroke with right hemiparesis and expressive aphasia in 2015.  Loop recorder did not show evidence of atrial fibrillation throughout its entire 3-year operation. Since this cryptogenic stroke, he has an implantable loop recorder that has reached end of service and is left in place.  During 3 years of monitoring that did not show atrial fibrillation.    He presented with ST segment elevation myocardial infarction in May of 2012. At that time had an inferior wall infarction, manifesting as unstable angina pectoris and eventually a syncopal episode. The right coronary artery was totally occluded and revascularization was difficult but eventually he received a 3.5 x 24 mm Promus drug-eluting stent to the mid RCA. Also noted was 50% ostial stenosis of the first diagonal artery and 40% ostial stenosis of the second diagonal artery branches of the LAD, as well as total occlusion of a small left circumflex system. There was evidence of inferolateral hypokinesis and ejection fraction of 45-50% at that time. He presented with chest [facility] to [facility] in 2015 when he had a repeat coronary angiogram that was reportedly  normal. I don't have that report. In May 2015 he presented with right hemiparesis and expressive aphasia with an infarction in the territory of the left middle cerebral artery. An etiology was not identified. He underwent loop recorder implantation in August 2015 and in 3 years the device has not recorded any arrhythmia. His echo performed in March  2015 and September 2018 showed left ventricular ejection fraction of 50-55 %with abnormal relaxation and no regional wall motion abnormalities,  no serious valvular abnormalities normal atrial sizes. He was admitted to [facility] in early March 2023 with severe shortness of breath and was treated for both heart failure and COPD exacerbation (steroids, bronchodilators, furosemide ).  Echocardiogram performed that time showed LVEF 35-40% with grade 1 diastolic dysfunction.  BNP 197  February 28, decreased to 60 on March 31.  Cardiac catheterization 07/25/2021 showed normal right and left heart filling pressures.  LVEF was estimated to be severely reduced with an ejection fraction of 30-35%.  The previously placed stent in the right coronary artery was widely patent and he did not have any other meaningful stenoses.  Most severe lesion was a 40% proximal-mid left circumflex stenosis. During the hospitalization for acute shortness of breath at [facility] in March 2023 LVEF was estimated at 35-40%.  Entresto  and Farxiga  were initiated.  Cardiac catheterization in July 2023 did not show any new coronary abnormalities.  He underwent implantation of a dual-chamber CRT-P device 07/30/2021 (Abbott Quadra Allure MP, 07/30/21, [place]).  Follow-up echocardiogram in November 2023 showed improved LVEF 40-45% with inferior wall akinesis.      Past Medical History:  Diagnosis Date   Adrenal adenoma, left    Anxiety    Aortic atherosclerosis (HCC)    Asthma without status asthmaticus    Atherosclerotic cerebrovascular disease    Overview:  Small and facial weakness with minimal weakness    CAD (coronary artery disease)    a. 05/2010 Inf STEMI/PCI: RCA 128m (3.5x24 Promus DES), LCX small, 100, D1 50ost, D2 40ost, EF 45-50%; b. 03/2013 Cath ([facility]): 40% mLAD, 20% stenosis mRCA (site of the previous stent); LVEF 55%.   Cervical disc disease    a. s/p C3-4 fusion 2003.   Cervical myelopathy (HCC)    a.  09/2016 Admit w/ left sided wkns->found to have progressive cervical disc dzs above and below prior fusioin site; b. 10/2016 s/p ant cervical diskectomy.   CHF (congestive heart failure) (HCC)    COPD (chronic obstructive pulmonary disease) (HCC)    Cryptogenic stroke (HCC) 05/2013    s/p acute L MCA territory infarct; b. 07/2013 s/p MDT Linq ILR-->No AFib to documented to date (09/2016).   Current use of long term anticoagulation    DAPT therapy (ASA + clopidogrel )   Depression    Diabetes mellitus without complication (HCC)    Essential (primary) hypertension    Gastritis    GERD (gastroesophageal reflux disease)    H/O insomnia    History of cerebrovascular accident (CVA) with residual deficit 2015   facial drooping on one side of face is only residual since stroke   IDA (iron deficiency anemia)    Inguinal hernia    Insomnia disorder    Ischemic cardiomyopathy    a. 05/2010 LV gram: EF 45-50% @ time of inf MI; b. 09/2016 Echo: EF 50-55%, inf HK, Gr1 DD, mild MR.   Mixed hyperlipidemia    Nephrolithiasis    Obesity    OSA (obstructive sleep apnea)    no cpap  Osteoarthritis    Presence of permanent cardiac pacemaker    STEMI (ST elevation myocardial infarction) (HCC) 06/07/2010   1 stent   Tobacco abuse    Urinary incontinence    Varicose veins    Vertigo     Past Surgical History:  Procedure Laterality Date   ANTERIOR CERVICAL DECOMP/DISCECTOMY FUSION N/A 10/22/2016   Procedure: ANTERIOR CERVICAL DECOMPRESSION/DISCECTOMY FUSION 2 LEVELS C3-4, C6-7;  Surgeon: Clois Fret, MD;  Location: [facility] ORS;  Service: Neurosurgery;  Laterality: N/A;   BACK SURGERY     BIV PACEMAKER INSERTION CRT-P N/A 07/30/2021   Procedure: BIV PACEMAKER INSERTION CRT-P;  Surgeon: Inocencio Soyla Lunger, MD;  Location: [facility] CV [facility];  Service: Cardiovascular;  Laterality: N/A;   CARDIAC CATHETERIZATION  06/07/2010   50 % ostial narrowing in 1st diagonal from LAD, 2nd diagonal had 40% ostial  narrowing; AV groove circumflex was diminutive and occluded proximally; RCA totally occluded in mid segment - stented with 3.5 x 24mm Promus Element DES  - See Media tab   CARDIAC CATHETERIZATION  04/04/2013   40% mLAD, 20% mRCA at the site of the previous stent.  LVEF was 55%.   COLONOSCOPY WITH PROPOFOL  N/A 02/26/2015   Procedure: COLONOSCOPY WITH PROPOFOL ;  Surgeon: Donnice Vaughn Manes, MD;  Location[facility] ENDOSCOPY;  Service: Endoscopy;  Laterality: N/A;   COLONOSCOPY WITH PROPOFOL  N/A 03/16/2020   Procedure: COLONOSCOPY WITH PROPOFOL ;  Surgeon: Maryruth Ole DASEN, MD;  Location: [facility] ENDOSCOPY;  Service: Endoscopy;  Laterality: N/A;   COLONOSCOPY WITH PROPOFOL  N/A 05/26/2022   Procedure: COLONOSCOPY WITH PROPOFOL ;  Surgeon: Maryruth Ole DASEN, MD;  Location: [facility] ENDOSCOPY;  Service: Endoscopy;  Laterality: N/A;   CORONARY ANGIOPLASTY     ESOPHAGOGASTRODUODENOSCOPY  02/26/2015   Procedure: ESOPHAGOGASTRODUODENOSCOPY (EGD);  Surgeon: Donnice Vaughn Manes, MD;  Location: [facility] ENDOSCOPY;  Service: Endoscopy;;   ESOPHAGOGASTRODUODENOSCOPY (EGD) WITH PROPOFOL  N/A 03/16/2020   Procedure: ESOPHAGOGASTRODUODENOSCOPY (EGD) WITH PROPOFOL ;  Surgeon: Maryruth Ole DASEN, MD;  Location: [facility] ENDOSCOPY;  Service: Endoscopy;  Laterality: N/A;   EYE SURGERY     cataract replaced   HERNIA REPAIR     LOOP RECORDER IMPLANT  08/16/2013   Dr. Francyne   RIGHT/LEFT HEART CATH AND CORONARY ANGIOGRAPHY Bilateral 06/07/2021   Procedure: RIGHT/LEFT HEART CATH AND CORONARY ANGIOGRAPHY;  Surgeon: Darron Deatrice LABOR, MD;  Location: [facility] INVASIVE CV LAB;  Service: Cardiovascular;  Laterality: Bilateral;   XI ROBOTIC ASSISTED INGUINAL HERNIA REPAIR WITH MESH Left 05/23/2020   Procedure: XI ROBOTIC ASSISTED INGUINAL HERNIA REPAIR WITH MESH;  Surgeon: Rodolph Romano, MD;  Location: [facility] ORS;  Service: General;  Laterality: Left;    Current Medications: Outpatient Medications Prior to Visit  Medication Sig  Dispense Refill   acetaminophen  (TYLENOL ) 325 MG tablet Take 650 mg by mouth every 6 (six) hours as needed for moderate pain.     albuterol  (VENTOLIN  HFA) 108 (90 Base) MCG/ACT inhaler Inhale 2 puffs into the lungs as needed.     aspirin  81 MG chewable tablet Chew 1 tablet (81 mg total) by mouth daily.     carvedilol  (COREG ) 6.25 MG tablet TAKE 1 TABLET(6.25 MG) BY MOUTH TWICE DAILY 180 tablet 2   clopidogrel  (PLAVIX ) 75 MG tablet TAKE 1 TABLET(75 MG) BY MOUTH DAILY 90 tablet 3   empagliflozin  (JARDIANCE ) 10 MG TABS tablet TAKE 1 TABLET(10 MG) BY MOUTH DAILY 90 tablet 3   furosemide  (LASIX ) 20 MG tablet TAKE 1 TABLET(20 MG) BY MOUTH DAILY AS NEEDED FOR FLUID RETENTION OR SWELLING.  MAY TAKE 1 TABLET DAILY AS NEEDED FOR SWELLING 25 tablet 3   pantoprazole  (PROTONIX ) 40 MG tablet Take 40 mg by mouth daily.     PARoxetine  (PAXIL ) 40 MG tablet Take 40 mg by mouth daily.      sacubitril -valsartan  (ENTRESTO ) 97-103 MG TAKE 1 TABLET BY MOUTH TWICE DAILY 60 tablet 11   spironolactone  (ALDACTONE ) 25 MG tablet Take 1 tablet (25 mg total) by mouth daily. 90 tablet 3   traZODone  (DESYREL ) 100 MG tablet Take 1 tablet by mouth at bedtime.     TRELEGY ELLIPTA 100-62.5-25 MCG/ACT AEPB Inhale 1 puff into the lungs daily.     pravastatin  (PRAVACHOL ) 80 MG tablet Take 1 tablet (80 mg total) by mouth daily. 90 tablet 1   ACCU-CHEK GUIDE test strip USE 1 STRIP TO TEST ONCE DAILY     ferrous sulfate  325 (65 FE) MG EC tablet Take 1 tablet (325 mg total) by mouth in the morning and at bedtime. 60 tablet 11   Ipratropium-Albuterol  (COMBIVENT  RESPIMAT) 20-100 MCG/ACT AERS respimat Inhale 1 puff into the lungs every 4 (four) hours as needed.     NICOTROL  NS 10 MG/ML SOLN Place 0.1 mLs (1 mg total) into both nostrils as needed.  0   No facility-administered medications prior to visit.     Allergies:   Penicillins  Family History:  The patient's family history includes Cancer in his mother; Coronary artery disease in his  brother; Diabetes in his brother; Stroke in his maternal grandmother.   ROS:   Please see the history of present illness.    ROS All other systems are reviewed and are negative.   PHYSICAL EXAM:   VS:  BP 102/66 (BP Location: Left Arm, Patient Position: Sitting, Cuff Size: Normal)   Pulse 65   Ht 5' 9 (1.753 m)   Wt 179 lb 9.6 oz (81.5 kg)   SpO2 94%   BMI 26.52 kg/m       General: Alert, oriented x3, no distress, left subclavian pacemaker site Head: no evidence of trauma, PERRL, EOMI, no exophtalmos or lid lag, no myxedema, no xanthelasma; normal ears, nose and oropharynx Neck: normal jugular venous pulsations and no hepatojugular reflux; brisk carotid pulses without delay and no carotid bruits Chest: clear to auscultation, no signs of consolidation by percussion or palpation, normal fremitus, symmetrical and full respiratory excursions Cardiovascular: normal position and quality of the apical impulse, regular rhythm, normal first and second heart sounds, no murmurs, rubs or gallops Abdomen: no tenderness or distention, no masses by palpation, no abnormal pulsatility or arterial bruits, normal bowel sounds, no hepatosplenomegaly Extremities: no clubbing, cyanosis or edema; 2+ radial, ulnar and brachial pulses bilaterally; 2+ right femoral, posterior tibial and dorsalis pedis pulses; 2+ left femoral, posterior tibial and dorsalis pedis pulses; no subclavian or femoral bruits Neurological: grossly nonfocal Psych: Normal mood and affect     Wt Readings from Last 3 Encounters:  10/01/23 179 lb 9.6 oz (81.5 kg)  02/03/23 179 lb 3.2 oz (81.3 kg)  08/27/22 181 lb (82.1 kg)      Studies/Labs Reviewed:   06/07/2021 RIGHT/LEFT HEART CATH AND CORONARY ANGIOGRAPHY    Prox Cx to Mid Cx lesion is 40% stenosed.   Prox RCA to Mid RCA lesion is 10% stenosed.   There is moderate left ventricular systolic dysfunction.   LV end diastolic pressure is normal.   1.  Widely patent RCA stent  with minimal restenosis.  No evidence of obstructive coronary artery disease.  2.  Moderately to severely reduced LV systolic function with an EF of 30 to 35%. 3.  Right heart catheterization showed normal right and left-sided filling pressures, minimal pulmonary hypertension and normal cardiac output.   Recommendations: The patient's cardiomyopathy seems to be out of proportion to his coronary artery disease and his previous inferior myocardial infarction.  There is likely a nonischemic component.  His hemodynamics look excellent.  Continue same medications. Consider CRT given underlying left bundle branch block and worsening ejection fraction.   Fick Cardiac Output 4.62 L/min  Fick Cardiac Output Index 2.29 (L/min)/BSA  RA A Wave 9 mmHg  RA V Wave 8 mmHg  RA Mean 6 mmHg  RV Systolic Pressure 34 mmHg  RV Diastolic Pressure 3 mmHg  RV EDP 10 mmHg  PA Systolic Pressure 32 mmHg  PA Diastolic Pressure 16 mmHg  PA Mean 22 mmHg  PW A Wave 11 mmHg  PW V Wave 12 mmHg  PW Mean 10 mmHg  LV Systolic Pressure 100 mmHg  LV Diastolic Pressure -1 mmHg  LV EDP 7 mmHg  AOp Systolic Pressure 93 mmHg  AOp Diastolic Pressure 55 mmHg  AOp Mean Pressure 70 mmHg  LVp Systolic Pressure 94 mmHg  LVp Diastolic Pressure 0 mmHg  LVp EDP Pressure 9 mmHg  QP/QS 1  TPVR Index 9.59 HRUI    Echocardiogram 03/20/2021:    1. Left ventricular ejection fraction, by estimation, is 35 to 40%. The  left ventricle has moderately decreased function. The left ventricle  demonstrates regional wall motion abnormalities (see scoring  diagram/findings for description). Left ventricular   diastolic parameters are consistent with Grade I diastolic dysfunction  (impaired relaxation). There is LV global hypokinesis with inferior and  inferolateral wall akinesis.   2. Right ventricular systolic function is normal. The right ventricular  size is normal.   3. The mitral valve is normal in structure. Mild mitral valve   regurgitation.   4. The aortic valve is tricuspid. Aortic valve regurgitation is not  visualized. Aortic valve sclerosis is present, with no evidence of aortic  valve stenosis.   5. Aortic dilatation noted. There is mild dilatation of the aortic root,  measuring 40 mm.   6. The inferior vena cava is normal in size with <50% respiratory  variability, suggesting right atrial pressure of 8 mmHg.   EKG:    EKG Interpretation Date/Time:  Thursday October 01 2023 09:44:29 EDT Ventricular Rate:  65 PR Interval:  224 QRS Duration:  110 QT Interval:  408 QTC Calculation: 424 R Axis:   -63  Text Interpretation: Atrial-sensed ventricular-paced rhythm Biventricular pacemaker detected When compared with ECG of 03-Feb-2023 13:06, Vent. rate has increased BY   3 BPM Confirmed by Younique Casad [id number]) on 10/01/2023 10:20:13 AM         Recent Labs:     Latest Ref Rng & Units 03/13/2023    1:19 PM 02/03/2023    2:15 PM 07/29/2021   10:36 AM  BMP  Glucose 70 - 99 mg/dL 892  888  893   BUN 8 - 27 mg/dL 20  14  17    Creatinine 0.76 - 1.27 mg/dL 9.10  9.02  9.03   BUN/Creat Ratio 10 - 24 22  14  18    Sodium 134 - 144 mmol/L 139  142  137   Potassium 3.5 - 5.2 mmol/L 4.9  5.3  4.5   Chloride 96 - 106 mmol/L 99  103  103   CO2 20 -  29 mmol/L 26  27  30    Calcium  8.6 - 10.2 mg/dL 89.8  89.9  9.7       Lipid Panel    Component Value Date/Time   CHOL 115 12/22/2019 0908   CHOL 110 05/22/2013 0526   TRIG 66 12/22/2019 0908   TRIG 162 05/22/2013 0526   HDL 46 12/22/2019 0908   HDL 17 (L) 05/22/2013 0526   CHOLHDL 2.5 12/22/2019 0908   CHOLHDL 3.1 10/08/2016 0507   VLDL 17 10/08/2016 0507   VLDL 32 05/22/2013 0526   LDLCALC 55 12/22/2019 0908   LDLCALC 61 05/22/2013 0526   LDLDIRECT 67 04/19/2021 1126   04/25/2021 Hemoglobin A1c 6.3%      ASSESSMENT:    1. Chronic combined systolic and diastolic congestive heart failure (HCC)   2. Coronary artery disease involving native  coronary artery of native heart without angina pectoris   3. Status post biventricular pacemaker   4. History of arterial ischemic stroke   5. Hyperlipidemia LDL goal <70   6. Prediabetes   7. Essential hypertension   8. Chronic obstructive pulmonary disease, unspecified COPD type (HCC)      PLAN:  In order of problems listed above:  CHF: NYHA functional class I following implantation of CRT-PE device.  Most recent LVEF 40-45% by echo November 2023.  He is on guideline directed medical therapy with Entresto /carvedilol /spironolactone /dapagliflozin .  Clinically euvolemic on very low-dose of loop diuretic which he takes very infrequently. CAD: Does not have angina pectoris and had no evidence of progression disease on angiography performed in July 2023. CRT-P: Normal device function with almost 98% biventricular pacing.  Monitored by Dr. Inocencio.   Hx cryptogenic ischemic stroke: No new neurological events recently.  This has not recurred in over 8 years.  He did not have atrial fibrillation during 3 years of implantable loop recorder monitoring and no atrial fibrillation has been detected since his biventricular device was implanted.  Remains on dual antiplatelet therapy with aspirin  and clopidogrel . HLP/prediabetes: Good glycemic control, but LDL cholesterol is well above target of 70 or less.  Switch from pravastatin  to rosuvastatin  20 mg daily and recheck in a few months. HTN: Well-controlled on current medications. COPD/history smoking: He quit smoking a month ago and is fervently congratulated for this.  Bronchodilators have also helped. Depression: Currently seems to have excellent mood and outlook.    Medication Adjustments/Labs and Tests Ordered: Current medicines are reviewed at length with the patient today.  Concerns regarding medicines are outlined above.  Medication changes, Labs and Tests ordered today are listed in the Patient Instructions below. Patient Instructions   Medication Instructions:  Stop pravastatin  Start Rosuvastatin  20 mg  *If you need a refill on your cardiac medications before your next appointment, please call your pharmacy*  Lab Work: Lipid panel- fasting needed in 2-3 months If you have labs (blood work) drawn today and your tests are completely normal, you will receive your results only by: MyChart Message (if you have MyChart) OR A paper copy in the mail If you have any lab test that is abnormal or we need to change your treatment, we will call you to review the results.  Testing/Procedures: None ordered  Follow-Up: At [facility], you and your health needs are our priority.  As part of our continuing mission to provide you with exceptional heart care, our providers are all part of one team.  This team includes your primary Cardiologist (physician) and Advanced Practice Providers or APPs (  Physician Assistants and Nurse Practitioners) who all work together to provide you with the care you need, when you need it.  Your next appointment:   1 year(s)  Provider:   Jerel Balding, MD    We recommend signing up for the patient portal called MyChart.  Sign up information is provided on this After Visit Summary.  MyChart is used to connect with patients for Virtual Visits (Telemedicine).  Patients are able to view lab/test results, encounter notes, upcoming appointments, etc.  Non-urgent messages can be sent to your provider as well.   To learn more about what you can do with MyChart, go to [email or web address].          Signed, Jerel Balding, MD  10/01/2023 4:46 PM    [facility] [address] [place], [place], [place]  [address] Phone: [phone]; Fax: [phone]   Shared Decision Making/Informed Consent The risks [stroke (1 in 1000), death (1 in 1000), kidney failure [usually temporary] (1 in 500), bleeding (1 in 200), allergic reaction [possibly serious] (1 in 200)], benefits (diagnostic  support and management of coronary artery disease) and alternatives of a cardiac catheterization were discussed in detail with Louis Gutierrez and he is willing to proceed.

## 2023-10-01 NOTE — Patient Instructions (Signed)
 Medication Instructions:  Stop pravastatin  Start Rosuvastatin  20 mg  *If you need a refill on your cardiac medications before your next appointment, please call your pharmacy*  Lab Work: Lipid panel- fasting needed in 2-3 months If you have labs (blood work) drawn today and your tests are completely normal, you will receive your results only by: MyChart Message (if you have MyChart) OR A paper copy in the mail If you have any lab test that is abnormal or we need to change your treatment, we will call you to review the results.  Testing/Procedures: None ordered  Follow-Up: At [facility], you and your health needs are our priority.  As part of our continuing mission to provide you with exceptional heart care, our providers are all part of one team.  This team includes your primary Cardiologist (physician) and Advanced Practice Providers or APPs (Physician Assistants and Nurse Practitioners) who all work together to provide you with the care you need, when you need it.  Your next appointment:   1 year(s)  Provider:   Jerel Balding, MD    We recommend signing up for the patient portal called MyChart.  Sign up information is provided on this After Visit Summary.  MyChart is used to connect with patients for Virtual Visits (Telemedicine).  Patients are able to view lab/test results, encounter notes, upcoming appointments, etc.  Non-urgent messages can be sent to your provider as well.   To learn more about what you can do with MyChart, go to [email or web address].

## 2023-10-20 ENCOUNTER — Encounter: Admitting: Cardiovascular Disease

## 2023-10-29 ENCOUNTER — Encounter: Payer: Self-pay | Admitting: Cardiovascular Disease

## 2023-10-29 NOTE — Progress Notes (Signed)
 Remote PPM Transmission

## 2023-10-29 NOTE — Telephone Encounter (Signed)
 Yes, that is fine. Tell him it is actually the same manufacturer, please.

## 2023-10-30 ENCOUNTER — Ambulatory Visit: Payer: Medicare HMO

## 2023-10-30 DIAGNOSIS — I447 Left bundle-branch block, unspecified: Secondary | ICD-10-CM

## 2023-10-30 LAB — CUP PACEART REMOTE DEVICE CHECK
Battery Remaining Longevity: 65 mo
Battery Remaining Percentage: 72 %
Battery Voltage: 2.98 V
Brady Statistic AP VP Percent: 8 %
Brady Statistic AP VS Percent: 1 %
Brady Statistic AS VP Percent: 89 %
Brady Statistic AS VS Percent: 1.3 %
Brady Statistic RA Percent Paced: 6.8 %
Date Time Interrogation Session: 20251010020008
Implantable Lead Connection Status: 753985
Implantable Lead Connection Status: 753985
Implantable Lead Connection Status: 753985
Implantable Lead Implant Date: 20230711
Implantable Lead Implant Date: 20230711
Implantable Lead Implant Date: 20230711
Implantable Lead Location: 753858
Implantable Lead Location: 753859
Implantable Lead Location: 753860
Implantable Pulse Generator Implant Date: 20230711
Lead Channel Impedance Value: 1075 Ohm
Lead Channel Impedance Value: 440 Ohm
Lead Channel Impedance Value: 580 Ohm
Lead Channel Pacing Threshold Amplitude: 0.5 V
Lead Channel Pacing Threshold Amplitude: 0.5 V
Lead Channel Pacing Threshold Amplitude: 1.25 V
Lead Channel Pacing Threshold Pulse Width: 0.5 ms
Lead Channel Pacing Threshold Pulse Width: 0.5 ms
Lead Channel Pacing Threshold Pulse Width: 0.8 ms
Lead Channel Sensing Intrinsic Amplitude: 12 mV
Lead Channel Sensing Intrinsic Amplitude: 5 mV
Lead Channel Setting Pacing Amplitude: 2 V
Lead Channel Setting Pacing Amplitude: 2.5 V
Lead Channel Setting Pacing Amplitude: 2.5 V
Lead Channel Setting Pacing Pulse Width: 0.5 ms
Lead Channel Setting Pacing Pulse Width: 0.8 ms
Lead Channel Setting Sensing Sensitivity: 2 mV
Pulse Gen Model: 3562
Pulse Gen Serial Number: 8088605

## 2023-11-01 ENCOUNTER — Ambulatory Visit: Payer: Self-pay | Admitting: Cardiology

## 2023-11-02 NOTE — Progress Notes (Signed)
 Remote PPM Transmission

## 2023-11-05 ENCOUNTER — Other Ambulatory Visit: Payer: Self-pay | Admitting: Cardiovascular Disease

## 2023-11-09 MED ORDER — CARVEDILOL 6.25 MG PO TABS: 6.2500 mg | ORAL_TABLET | Freq: Two times a day (BID) | ORAL | 3 refills | Status: AC

## 2023-11-10 NOTE — Progress Notes (Signed)
 Referring Physician:  Lenon Layman ORN, MD [address] [facility] [place],  [place] [address]  Primary Physician:  Lenon Layman ORN, MD  History of Present Illness: 11/17/2023 Louis Gutierrez has a history of HTN, STEMI, CAD, CVA, CHF, OSA, COPD, mixed hyperlipidemia, obesity, depression, prediabetes.   He has implantable loop recorder and pacemaker.   History of ACDF C3-C7 by Dr. Clois at 2 different times. Last seen by Dr. Clois in 2021 and further surgery not recommended. He was doing well after his surgery.   He has 3 week history of constant right sided neck pain with intermittent right arm pain into his fingers. He has intermittent slight aching in left arm. Right arm pain > left arm pain. He has intermittent numbness in right arm/hand and left hand. He has weakness in right arm and left hand that is intermittent. Pain is worse with standing and walking. Some improvement in recliner.   He has dropped things. He and his wife think his balance is worse in last few months.    He is on PLAVIX .   Tobacco use: Quit in August 2025, 53 pack year history.   Bowel/Bladder Dysfunction: none  Conservative measures:  Physical therapy: Has not participated in Multimodal medical therapy including regular antiinflammatories: Tylenol   Injections:no epidural steroid injections  Past Surgery:  ACDF C3-4 C6-7 10/22/2016 Dr. Clois.   The symptoms are causing a significant impact on the patient's life.   Review of Systems:  A 10 point review of systems is negative, except for the pertinent positives and negatives detailed in the HPI.  Past Medical History: Past Medical History:  Diagnosis Date   Adrenal adenoma, left    Anxiety    Aortic atherosclerosis    Asthma without status asthmaticus    Atherosclerotic cerebrovascular disease    Overview:  Small and facial weakness with minimal weakness    CAD (coronary artery disease)    a. 05/2010  Inf STEMI/PCI: RCA 137m (3.5x24 Promus DES), LCX small, 100, D1 50ost, D2 40ost, EF 45-50%; b. 03/2013 Cath ([facility]): 40% mLAD, 20% stenosis mRCA (site of the previous stent); LVEF 55%.   Cervical disc disease    a. s/p C3-4 fusion 2003.   Cervical myelopathy (HCC)    a. 09/2016 Admit w/ left sided wkns->found to have progressive cervical disc dzs above and below prior fusioin site; b. 10/2016 s/p ant cervical diskectomy.   CHF (congestive heart failure) (HCC)    COPD (chronic obstructive pulmonary disease) (HCC)    Cryptogenic stroke (HCC) 05/2013    s/p acute L MCA territory infarct; b. 07/2013 s/p MDT Linq ILR-->No AFib to documented to date (09/2016).   Current use of long term anticoagulation    DAPT therapy (ASA + clopidogrel )   Depression    Diabetes mellitus without complication (HCC)    Essential (primary) hypertension    Gastritis    GERD (gastroesophageal reflux disease)    H/O insomnia    History of cerebrovascular accident (CVA) with residual deficit 2015   facial drooping on one side of face is only residual since stroke   IDA (iron deficiency anemia)    Inguinal hernia    Insomnia disorder    Ischemic cardiomyopathy    a. 05/2010 LV gram: EF 45-50% @ time of inf MI; b. 09/2016 Echo: EF 50-55%, inf HK, Gr1 DD, mild MR.   Mixed hyperlipidemia    Nephrolithiasis    Obesity    OSA (obstructive sleep  apnea)    no cpap   Osteoarthritis    Presence of permanent cardiac pacemaker    STEMI (ST elevation myocardial infarction) (HCC) 06/07/2010   1 stent   Tobacco abuse    Urinary incontinence    Varicose veins    Vertigo     Past Surgical History: Past Surgical History:  Procedure Laterality Date   ANTERIOR CERVICAL DECOMP/DISCECTOMY FUSION N/A 10/22/2016   Procedure: ANTERIOR CERVICAL DECOMPRESSION/DISCECTOMY FUSION 2 LEVELS C3-4, C6-7;  Surgeon: Clois Fret, MD;  Location: [facility] ORS;  Service: Neurosurgery;  Laterality: N/A;   BACK SURGERY     BIV PACEMAKER  INSERTION CRT-P N/A 07/30/2021   Procedure: BIV PACEMAKER INSERTION CRT-P;  Surgeon: Inocencio Soyla Lunger, MD;  Location: [facility] CV [facility];  Service: Cardiovascular;  Laterality: N/A;   CARDIAC CATHETERIZATION  06/07/2010   50 % ostial narrowing in 1st diagonal from LAD, 2nd diagonal had 40% ostial narrowing; AV groove circumflex was diminutive and occluded proximally; RCA totally occluded in mid segment - stented with 3.5 x 24mm Promus Element DES  - See Media tab   CARDIAC CATHETERIZATION  04/04/2013   40% mLAD, 20% mRCA at the site of the previous stent.  LVEF was 55%.   COLONOSCOPY WITH PROPOFOL  N/A 02/26/2015   Procedure: COLONOSCOPY WITH PROPOFOL ;  Surgeon: Donnice Vaughn Manes, MD;  Location:  [facility];  Service: Endoscopy;  Laterality: N/A;   COLONOSCOPY WITH PROPOFOL  N/A 03/16/2020   Procedure: COLONOSCOPY WITH PROPOFOL ;  Surgeon: Maryruth Ole DASEN, MD;  Location: [facility] ENDOSCOPY;  Service: Endoscopy;  Laterality: N/A;   COLONOSCOPY WITH PROPOFOL  N/A 05/26/2022   Procedure: COLONOSCOPY WITH PROPOFOL ;  Surgeon: Maryruth Ole DASEN, MD;  Location: [facility] ENDOSCOPY;  Service: Endoscopy;  Laterality: N/A;   CORONARY ANGIOPLASTY     ESOPHAGOGASTRODUODENOSCOPY  02/26/2015   Procedure: ESOPHAGOGASTRODUODENOSCOPY (EGD);  Surgeon: Donnice Vaughn Manes, MD;  Location: [facility] ENDOSCOPY;  Service: Endoscopy;;   ESOPHAGOGASTRODUODENOSCOPY (EGD) WITH PROPOFOL  N/A 03/16/2020   Procedure: ESOPHAGOGASTRODUODENOSCOPY (EGD) WITH PROPOFOL ;  Surgeon: Maryruth Ole DASEN, MD;  Location: [facility] ENDOSCOPY;  Service: Endoscopy;  Laterality: N/A;   EYE SURGERY     cataract replaced   HERNIA REPAIR     LOOP RECORDER IMPLANT  08/16/2013   Dr. Francyne   RIGHT/LEFT HEART CATH AND CORONARY ANGIOGRAPHY Bilateral 06/07/2021   Procedure: RIGHT/LEFT HEART CATH AND CORONARY ANGIOGRAPHY;  Surgeon: Darron Deatrice LABOR, MD;  Location: [facility] INVASIVE CV LAB;  Service: Cardiovascular;  Laterality: Bilateral;   XI ROBOTIC  ASSISTED INGUINAL HERNIA REPAIR WITH MESH Left 05/23/2020   Procedure: XI ROBOTIC ASSISTED INGUINAL HERNIA REPAIR WITH MESH;  Surgeon: Rodolph Romano, MD;  Location: [facility] ORS;  Service: General;  Laterality: Left;    Allergies: Allergies as of 11/17/2023 - Review Complete 11/17/2023  Allergen Reaction Noted   Penicillins Anaphylaxis, Hives, and Nausea And Vomiting 06/20/2012    Medications: Outpatient Encounter Medications as of 11/17/2023  Medication Sig   acetaminophen  (TYLENOL ) 325 MG tablet Take 650 mg by mouth every 6 (six) hours as needed for moderate pain.   albuterol  (VENTOLIN  HFA) 108 (90 Base) MCG/ACT inhaler Inhale 2 puffs into the lungs as needed.   aspirin  81 MG chewable tablet Chew 1 tablet (81 mg total) by mouth daily.   carvedilol  (COREG ) 6.25 MG tablet Take 1 tablet (6.25 mg total) by mouth 2 (two) times daily with a meal.   clopidogrel  (PLAVIX ) 75 MG tablet TAKE 1 TABLET(75 MG) BY MOUTH DAILY   empagliflozin  (JARDIANCE ) 10 MG TABS tablet TAKE  1 TABLET(10 MG) BY MOUTH DAILY   furosemide  (LASIX ) 20 MG tablet TAKE 1 TABLET(20 MG) BY MOUTH DAILY AS NEEDED FOR FLUID RETENTION OR SWELLING   pantoprazole  (PROTONIX ) 40 MG tablet Take 40 mg by mouth daily.   PARoxetine  (PAXIL ) 40 MG tablet Take 40 mg by mouth daily.    rosuvastatin  (CRESTOR ) 20 MG tablet Take 1 tablet (20 mg total) by mouth daily.   sacubitril -valsartan  (ENTRESTO ) 97-103 MG TAKE 1 TABLET BY MOUTH TWICE DAILY   spironolactone  (ALDACTONE ) 25 MG tablet Take 1 tablet (25 mg total) by mouth daily.   traZODone  (DESYREL ) 100 MG tablet Take 1 tablet by mouth at bedtime.   TRELEGY ELLIPTA 100-62.5-25 MCG/ACT AEPB Inhale 1 puff into the lungs daily.   No facility-administered encounter medications on file as of 11/17/2023.    Social History: Social History   Tobacco Use   Smoking status: Former    Current packs/day: 0.00    Average packs/day: 1 pack/day for 53.0 years (53.0 ttl pk-yrs)    Types: Cigarettes     Start date: 11/14/1966    Quit date: 08/2023    Years since quitting: 0.2    Passive exposure: Past   Smokeless tobacco: Never   Tobacco comments:    10/01/2023 Patient quit 1 month ago    Patient smokes 6 daily.  Vaping Use   Vaping status: Former   Start date: 11/14/2019   Substances: Nicotine   Substance Use Topics   Alcohol use: No   Drug use: No    Family Medical History: Family History  Problem Relation Age of Onset   Cancer Mother        oral   Stroke Maternal Grandmother    Diabetes Brother    Coronary artery disease Brother     Physical Examination: Vitals:   11/17/23 1409  BP: 110/60    General: Patient is well developed, well nourished, calm, collected, and in no apparent distress. Attention to examination is appropriate.  Respiratory: Patient is breathing without any difficulty.   NEUROLOGICAL:     Awake, alert, oriented to person, place, and time.  Speech is clear and fluent. Fund of knowledge is appropriate.   Cranial Nerves: Pupils equal round and reactive to light.  Facial tone is symmetric.    Mild tenderness in bilateral trapezial region.   No abnormal lesions on exposed skin.   Strength: Side Biceps Triceps Deltoid Interossei Grip Wrist Ext. Wrist Flex.  R 5 5 5 5 5 5 5   L 5 5 5 5 5 5 5    Side Iliopsoas Quads Hamstring PF DF EHL  R 5 5 5 5 5 5   L 5 5 5 5 5 5    Reflexes are 3+ and symmetric at the biceps, brachioradialis, patella and achilles.   Hoffman's is present bilaterally He has 1-2 beats clonus in his lower extremities.  Bilateral upper and lower extremity sensation is intact to light touch.     He limps and favors his left knee.   Medical Decision Making  Imaging: Nothing recent.    Assessment and Plan: Mr. Kottke has a history of ACDF C3-C7 by Dr. Clois at 2 different times. He was doing well after his surgery.   Now with a 3 week history of constant right sided neck pain with intermittent right arm pain into his  fingers. He has intermittent slight aching in left arm. Right arm pain > left arm pain. He has intermittent numbness in right arm/hand and left hand. He  has weakness in right arm and left hand that is intermittent.   He has dropped things. He and his wife think his balance is worse in last few months.    Previous surgery done for myelopathy- he remains hyper reflexic, has positive hoffmans, and 1-2 beats clonus.   Treatment options discussed with patient and following plan made:   - MRI of cervical spine to further evaluate worsening balance and dexterity issues.  - He has pacemaker and loop recorder. He states loop recorder is dead. Will do MRI at [facility] in [place]. Will let him know if he is unable to have MRI done.  - Cervical xrays done today. Will message him results.  - Will send him MyChart message with MRI results at his request.  - Depending on cervical MRI results, may consider thoracic MRI as well.   I spent a total of 30 minutes in face-to-face and non-face-to-face activities related to this patient's care today including review of outside records, review of imaging, review of symptoms, physical exam, discussion of differential diagnosis, discussion of treatment options, and documentation.   Glade Boys PA-C Dept. of Neurosurgery

## 2023-11-17 ENCOUNTER — Ambulatory Visit (INDEPENDENT_AMBULATORY_CARE_PROVIDER_SITE_OTHER): Admitting: Orthopedic Surgery

## 2023-11-17 ENCOUNTER — Ambulatory Visit

## 2023-11-17 ENCOUNTER — Encounter: Payer: Self-pay | Admitting: Orthopedic Surgery

## 2023-11-17 VITALS — BP 110/60 | Ht 69.0 in | Wt 184.1 lb

## 2023-11-17 DIAGNOSIS — M47812 Spondylosis without myelopathy or radiculopathy, cervical region: Secondary | ICD-10-CM

## 2023-11-17 DIAGNOSIS — M4722 Other spondylosis with radiculopathy, cervical region: Secondary | ICD-10-CM | POA: Diagnosis not present

## 2023-11-17 DIAGNOSIS — Z981 Arthrodesis status: Secondary | ICD-10-CM

## 2023-11-17 DIAGNOSIS — Z95818 Presence of other cardiac implants and grafts: Secondary | ICD-10-CM | POA: Diagnosis not present

## 2023-11-17 DIAGNOSIS — M5412 Radiculopathy, cervical region: Secondary | ICD-10-CM

## 2023-11-17 DIAGNOSIS — Z950 Presence of cardiac pacemaker: Secondary | ICD-10-CM

## 2023-11-17 DIAGNOSIS — R2689 Other abnormalities of gait and mobility: Secondary | ICD-10-CM | POA: Diagnosis not present

## 2023-11-17 NOTE — Patient Instructions (Signed)
 It was so nice to see you today. Thank you so much for coming in.    I want to get an MRI of your neck to look into things further. We will get this approved through your insurance and [facility] in [place] will call you to schedule the appointment. Ask about your patient responsibility. You do not need to pay this prior to getting MRI, they can bill you.   We will make sure it is safe for you to have MRI with your pacemaker and loop recorder.   After you have the MRI, it can take 14-28 days for me to get the results back. If I don't have them in 2 weeks, we will call to try to get the results.   Once I have the results, I will message you in MyChart.   We are going to get cervical xrays today. I will send you a message in MyChart.   Please do not hesitate to call if you have any questions or concerns. You can also message me in MyChart.   Glade Boys PA-C [phone]     The physicians and staff at [facility] at [facility] are committed to providing excellent care. You may receive a survey asking for feedback about your experience at our office. We value you your feedback and appreciate you taking the time to to fill it out. The [facility] leadership team is also available to discuss your experience in person, feel free to contact us  [phone].

## 2023-11-30 DIAGNOSIS — Z0100 Encounter for examination of eyes and vision without abnormal findings: Secondary | ICD-10-CM | POA: Diagnosis not present

## 2023-12-11 DIAGNOSIS — Z1331 Encounter for screening for depression: Secondary | ICD-10-CM | POA: Diagnosis not present

## 2023-12-11 DIAGNOSIS — Z0000 Encounter for general adult medical examination without abnormal findings: Secondary | ICD-10-CM | POA: Diagnosis not present

## 2023-12-11 DIAGNOSIS — J449 Chronic obstructive pulmonary disease, unspecified: Secondary | ICD-10-CM | POA: Diagnosis not present

## 2023-12-11 DIAGNOSIS — F325 Major depressive disorder, single episode, in full remission: Secondary | ICD-10-CM | POA: Diagnosis not present

## 2023-12-11 DIAGNOSIS — E119 Type 2 diabetes mellitus without complications: Secondary | ICD-10-CM | POA: Diagnosis not present

## 2023-12-11 DIAGNOSIS — I110 Hypertensive heart disease with heart failure: Secondary | ICD-10-CM | POA: Diagnosis not present

## 2023-12-11 DIAGNOSIS — Z125 Encounter for screening for malignant neoplasm of prostate: Secondary | ICD-10-CM | POA: Diagnosis not present

## 2023-12-11 DIAGNOSIS — I5022 Chronic systolic (congestive) heart failure: Secondary | ICD-10-CM | POA: Diagnosis not present

## 2023-12-11 DIAGNOSIS — I2510 Atherosclerotic heart disease of native coronary artery without angina pectoris: Secondary | ICD-10-CM | POA: Diagnosis not present

## 2023-12-18 NOTE — CV Procedure (Signed)
  Device system confirmed to be MRI conditional, with implant date > 6 weeks ago, and no evidence of abandoned or epicardial leads in review of most recent CXR  Device last cleared by EP Provider: Prentice Passey 12/18/23  Clearance is good through for 1 year as long as parameters remain stable at time of check. If pt undergoes a cardiac device procedure during that time, they should be re-cleared.   Tachy-therapies to be programmed off if applicable with device back to pre-MRI settings after completion of exam.  [employer] will be present for programming for the MRI.   Rocky Catalan, RT  12/18/2023 4:13 PM

## 2023-12-21 DIAGNOSIS — G4733 Obstructive sleep apnea (adult) (pediatric): Secondary | ICD-10-CM | POA: Diagnosis not present

## 2023-12-22 ENCOUNTER — Encounter (HOSPITAL_COMMUNITY): Payer: Self-pay

## 2023-12-22 ENCOUNTER — Ambulatory Visit (HOSPITAL_COMMUNITY)
Admission: RE | Admit: 2023-12-22 | Discharge: 2023-12-22 | Disposition: A | Discharge status: Home / Self Care | Source: Ambulatory Visit | Attending: Orthopedic Surgery | Admitting: Orthopedic Surgery

## 2024-01-01 DIAGNOSIS — M25522 Pain in left elbow: Secondary | ICD-10-CM | POA: Diagnosis not present

## 2024-01-01 DIAGNOSIS — L03114 Cellulitis of left upper limb: Secondary | ICD-10-CM | POA: Diagnosis not present

## 2024-01-01 DIAGNOSIS — M109 Gout, unspecified: Secondary | ICD-10-CM | POA: Diagnosis not present

## 2024-01-04 ENCOUNTER — Other Ambulatory Visit
Admission: RE | Admit: 2024-01-04 | Discharge: 2024-01-04 | Disposition: A | Discharge status: Home / Self Care | Source: Ambulatory Visit | Attending: Sports Medicine | Admitting: Sports Medicine

## 2024-01-04 DIAGNOSIS — M25422 Effusion, left elbow: Secondary | ICD-10-CM | POA: Insufficient documentation

## 2024-01-04 DIAGNOSIS — M778 Other enthesopathies, not elsewhere classified: Secondary | ICD-10-CM | POA: Diagnosis present

## 2024-01-04 DIAGNOSIS — M25522 Pain in left elbow: Secondary | ICD-10-CM | POA: Diagnosis present

## 2024-01-04 LAB — SYNOVIAL CELL COUNT + DIFF, W/ CRYSTALS
Eosinophils-Synovial: 0 %
Lymphocytes-Synovial Fld: 7 %
Monocyte-Macrophage-Synovial Fluid: 3 %
Neutrophil, Synovial: 90 %
WBC, Synovial: 449 /mm3 — ABNORMAL HIGH (ref 0–200)

## 2024-01-22 ENCOUNTER — Other Ambulatory Visit: Payer: Self-pay | Admitting: Cardiovascular Disease

## 2024-01-22 MED ORDER — SPIRONOLACTONE 25 MG PO TABS: 25.0000 mg | ORAL_TABLET | Freq: Every day | ORAL | 2 refills | Status: AC

## 2024-01-29 ENCOUNTER — Ambulatory Visit: Payer: Medicare HMO

## 2024-01-29 DIAGNOSIS — I447 Left bundle-branch block, unspecified: Secondary | ICD-10-CM | POA: Diagnosis not present

## 2024-02-01 LAB — CUP PACEART REMOTE DEVICE CHECK
Battery Remaining Longevity: 62 mo
Battery Remaining Percentage: 69 %
Battery Voltage: 2.98 V
Brady Statistic AP VP Percent: 8.2 %
Brady Statistic AP VS Percent: 1 %
Brady Statistic AS VP Percent: 89 %
Brady Statistic AS VS Percent: 1.3 %
Brady Statistic RA Percent Paced: 6.9 %
Date Time Interrogation Session: 20260109020008
Implantable Lead Connection Status: 753985
Implantable Lead Connection Status: 753985
Implantable Lead Connection Status: 753985
Implantable Lead Implant Date: 20230711
Implantable Lead Implant Date: 20230711
Implantable Lead Implant Date: 20230711
Implantable Lead Location: 753858
Implantable Lead Location: 753859
Implantable Lead Location: 753860
Implantable Pulse Generator Implant Date: 20230711
Lead Channel Impedance Value: 1125 Ohm
Lead Channel Impedance Value: 460 Ohm
Lead Channel Impedance Value: 600 Ohm
Lead Channel Pacing Threshold Amplitude: 0.5 V
Lead Channel Pacing Threshold Amplitude: 0.5 V
Lead Channel Pacing Threshold Amplitude: 1.25 V
Lead Channel Pacing Threshold Pulse Width: 0.5 ms
Lead Channel Pacing Threshold Pulse Width: 0.5 ms
Lead Channel Pacing Threshold Pulse Width: 0.8 ms
Lead Channel Sensing Intrinsic Amplitude: 12 mV
Lead Channel Sensing Intrinsic Amplitude: 5 mV
Lead Channel Setting Pacing Amplitude: 2 V
Lead Channel Setting Pacing Amplitude: 2.5 V
Lead Channel Setting Pacing Amplitude: 2.5 V
Lead Channel Setting Pacing Pulse Width: 0.5 ms
Lead Channel Setting Pacing Pulse Width: 0.8 ms
Lead Channel Setting Sensing Sensitivity: 2 mV
Pulse Gen Model: 3562
Pulse Gen Serial Number: 8088605

## 2024-02-02 ENCOUNTER — Ambulatory Visit: Payer: Self-pay | Admitting: Cardiology

## 2024-02-02 NOTE — Progress Notes (Signed)
 Remote PPM Transmission

## 2024-04-29 ENCOUNTER — Ambulatory Visit

## 2024-07-29 ENCOUNTER — Ambulatory Visit

## 2024-10-28 ENCOUNTER — Ambulatory Visit

## 2025-01-27 ENCOUNTER — Ambulatory Visit

## 2025-04-28 ENCOUNTER — Ambulatory Visit
